# Patient Record
Sex: Male | Born: 1995 | Race: White | Hispanic: No | Marital: Married | Smoking: Former smoker
Health system: Southern US, Community
[De-identification: ages and names within clinical notes are randomized; demographics above are authoritative.]

## PROBLEM LIST (undated history)

## (undated) DIAGNOSIS — E063 Autoimmune thyroiditis: Secondary | ICD-10-CM

## (undated) DIAGNOSIS — E119 Type 2 diabetes mellitus without complications: Secondary | ICD-10-CM

## (undated) DIAGNOSIS — A4902 Methicillin resistant Staphylococcus aureus infection, unspecified site: Secondary | ICD-10-CM

## (undated) DIAGNOSIS — J189 Pneumonia, unspecified organism: Secondary | ICD-10-CM

## (undated) DIAGNOSIS — E1110 Type 2 diabetes mellitus with ketoacidosis without coma: Secondary | ICD-10-CM

## (undated) HISTORY — PX: CHEST TUBE INSERTION: SHX231

---
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
## 2016-03-09 ENCOUNTER — Ambulatory Visit (HOSPITAL_COMMUNITY)
Admission: EM | Admit: 2016-03-09 | Discharge: 2016-03-09 | Disposition: A | Discharge status: Home / Self Care | Payer: BLUE CROSS/BLUE SHIELD | Attending: Family Medicine | Admitting: Family Medicine

## 2016-03-09 ENCOUNTER — Encounter (HOSPITAL_COMMUNITY): Payer: Self-pay | Admitting: Nurse Practitioner

## 2016-03-09 DIAGNOSIS — B86 Scabies: Secondary | ICD-10-CM

## 2016-03-09 HISTORY — DX: Type 2 diabetes mellitus without complications: E11.9

## 2016-03-09 HISTORY — DX: Pneumonia, unspecified organism: J18.9

## 2016-03-09 HISTORY — DX: Autoimmune thyroiditis: E06.3

## 2016-03-09 MED ORDER — PERMETHRIN 5 % EX CREA: TOPICAL_CREAM | CUTANEOUS | Status: DC

## 2016-03-09 NOTE — ED Provider Notes (Signed)
CSN: [id number]     Arrival date & time 03/09/16  1346 History   None    Chief Complaint  Patient presents with  . Rash   (Consider location/radiation/quality/duration/timing/severity/associated sxs/prior Treatment) Patient is a 20 y.o. male presenting with rash. The history is provided by the patient.  Rash Location:  Full body Quality: itchiness and scaling   Severity:  Moderate Onset quality:  Gradual Duration:  1 week Chronicity:  New Context: not exposure to similar rash   Context comment:  Works in [employer] and slept in one prior to onset   Past Medical History  Diagnosis Date  . Diabetes mellitus without complication (HCC)   . Hashimoto's disease   . Pneumonia    Past Surgical History  Procedure Laterality Date  . Chest tube insertion     History reviewed. No pertinent family history. Social History  Substance Use Topics  . Smoking status: Current Every Day Smoker    Types: Cigarettes  . Smokeless tobacco: None  . Alcohol Use: No    Review of Systems  Skin: Positive for rash.  All other systems reviewed and are negative.   Allergies  Ibuprofen  Home Medications   Prior to Admission medications   Medication Sig Start Date End Date Taking? Authorizing Provider  insulin glargine (LANTUS) 100 UNIT/ML injection Inject into the skin at bedtime.   Yes Historical Provider, MD  insulin lispro (HUMALOG) 100 UNIT/ML injection Inject into the skin 3 (three) times daily before meals.   Yes Historical Provider, MD  thyroid (ARMOUR) 15 MG tablet Take 15 mg by mouth daily.   Yes Historical Provider, MD  permethrin (ELIMITE) 5 % cream Apply over entire body from neck down tonight ,allow to dry and wash off in am, retreat in 1 week. 03/09/16   Linna HoffJames D Jeremian Whitby, MD   Meds Ordered and Administered this Visit  Medications - No data to display  BP 133/86 mmHg  Pulse 87  Temp(Src) 97.7 F (36.5 C) (Oral)  Resp 17  SpO2 98% No data found.   Physical Exam  Constitutional: He  is oriented to person, place, and time. He appears well-developed and well-nourished. No distress.  Neurological: He is alert and oriented to person, place, and time.  Skin: Skin is warm and dry. Rash noted.  Scattered papular crusting pruritic lesions.  Nursing note and vitals reviewed.   ED Course  Procedures (including critical care time)  Labs Review Labs Reviewed - No data to display  Imaging Review No results found.   Visual Acuity Review  Right Eye Distance:   Left Eye Distance:   Bilateral Distance:    Right Eye Near:   Left Eye Near:    Bilateral Near:         MDM   1. Scabies        Linna HoffJames D Ariq Khamis, MD 03/09/16 1534

## 2016-03-09 NOTE — ED Notes (Signed)
Pt c/o 1 week history itchy red rash to hands, arms, armpits, trunk, legs. He denies using any new products or medications. No one in house with similar rash. He is alert and breathing easily.

## 2016-05-13 DIAGNOSIS — E109 Type 1 diabetes mellitus without complications: Secondary | ICD-10-CM | POA: Diagnosis not present

## 2016-05-13 DIAGNOSIS — E038 Other specified hypothyroidism: Secondary | ICD-10-CM | POA: Diagnosis not present

## 2016-05-13 DIAGNOSIS — E1065 Type 1 diabetes mellitus with hyperglycemia: Secondary | ICD-10-CM | POA: Diagnosis not present

## 2016-10-03 DIAGNOSIS — F909 Attention-deficit hyperactivity disorder, unspecified type: Secondary | ICD-10-CM | POA: Diagnosis not present

## 2016-10-03 DIAGNOSIS — B9729 Other coronavirus as the cause of diseases classified elsewhere: Secondary | ICD-10-CM | POA: Diagnosis not present

## 2016-10-03 DIAGNOSIS — R531 Weakness: Secondary | ICD-10-CM | POA: Diagnosis not present

## 2016-10-03 DIAGNOSIS — R918 Other nonspecific abnormal finding of lung field: Secondary | ICD-10-CM | POA: Diagnosis not present

## 2016-10-03 DIAGNOSIS — Z794 Long term (current) use of insulin: Secondary | ICD-10-CM | POA: Diagnosis not present

## 2016-10-03 DIAGNOSIS — R1084 Generalized abdominal pain: Secondary | ICD-10-CM | POA: Diagnosis not present

## 2016-10-03 DIAGNOSIS — K859 Acute pancreatitis without necrosis or infection, unspecified: Secondary | ICD-10-CM | POA: Diagnosis not present

## 2016-10-03 DIAGNOSIS — Z8249 Family history of ischemic heart disease and other diseases of the circulatory system: Secondary | ICD-10-CM | POA: Diagnosis not present

## 2016-10-03 DIAGNOSIS — L309 Dermatitis, unspecified: Secondary | ICD-10-CM | POA: Diagnosis not present

## 2016-10-03 DIAGNOSIS — Z833 Family history of diabetes mellitus: Secondary | ICD-10-CM | POA: Diagnosis not present

## 2016-10-03 DIAGNOSIS — R404 Transient alteration of awareness: Secondary | ICD-10-CM | POA: Diagnosis not present

## 2016-10-03 DIAGNOSIS — E1010 Type 1 diabetes mellitus with ketoacidosis without coma: Secondary | ICD-10-CM | POA: Diagnosis not present

## 2016-10-03 DIAGNOSIS — E063 Autoimmune thyroiditis: Secondary | ICD-10-CM | POA: Diagnosis not present

## 2016-10-03 DIAGNOSIS — R079 Chest pain, unspecified: Secondary | ICD-10-CM | POA: Diagnosis not present

## 2016-10-03 DIAGNOSIS — Z7722 Contact with and (suspected) exposure to environmental tobacco smoke (acute) (chronic): Secondary | ICD-10-CM | POA: Diagnosis not present

## 2016-10-03 DIAGNOSIS — I10 Essential (primary) hypertension: Secondary | ICD-10-CM | POA: Diagnosis not present

## 2016-10-03 DIAGNOSIS — I498 Other specified cardiac arrhythmias: Secondary | ICD-10-CM | POA: Diagnosis not present

## 2016-10-04 DIAGNOSIS — E063 Autoimmune thyroiditis: Secondary | ICD-10-CM | POA: Diagnosis not present

## 2016-10-04 DIAGNOSIS — K859 Acute pancreatitis without necrosis or infection, unspecified: Secondary | ICD-10-CM | POA: Diagnosis not present

## 2016-10-04 DIAGNOSIS — E1010 Type 1 diabetes mellitus with ketoacidosis without coma: Secondary | ICD-10-CM | POA: Diagnosis not present

## 2016-10-04 DIAGNOSIS — I498 Other specified cardiac arrhythmias: Secondary | ICD-10-CM | POA: Diagnosis not present

## 2016-10-04 DIAGNOSIS — Z794 Long term (current) use of insulin: Secondary | ICD-10-CM | POA: Diagnosis not present

## 2016-10-04 DIAGNOSIS — R079 Chest pain, unspecified: Secondary | ICD-10-CM | POA: Diagnosis not present

## 2016-10-08 ENCOUNTER — Ambulatory Visit (HOSPITAL_COMMUNITY)
Admission: EM | Admit: 2016-10-08 | Discharge: 2016-10-08 | Disposition: A | Discharge status: Home / Self Care | Payer: BLUE CROSS/BLUE SHIELD | Attending: Family Medicine | Admitting: Family Medicine

## 2016-10-08 ENCOUNTER — Encounter (HOSPITAL_COMMUNITY): Payer: Self-pay | Admitting: Emergency Medicine

## 2016-10-08 DIAGNOSIS — B9562 Methicillin resistant Staphylococcus aureus infection as the cause of diseases classified elsewhere: Secondary | ICD-10-CM

## 2016-10-08 DIAGNOSIS — L0390 Cellulitis, unspecified: Secondary | ICD-10-CM

## 2016-10-08 HISTORY — DX: Methicillin resistant Staphylococcus aureus infection, unspecified site: A49.02

## 2016-10-08 MED ORDER — DOXYCYCLINE HYCLATE 100 MG PO TABS: 100.0000 mg | ORAL_TABLET | Freq: Two times a day (BID) | ORAL | 0 refills | Status: DC

## 2016-10-08 NOTE — Discharge Instructions (Signed)
You need to return to this facility or see her primary care doctor tomorrow if the wound has gotten much worse. Please don't hesitate to come by if you have any questions.

## 2016-10-08 NOTE — ED Triage Notes (Signed)
Open wound to right lower leg.  Patient is a diabetic

## 2016-10-08 NOTE — ED Provider Notes (Signed)
[facility]    CSN: [id number] Arrival date & time: 10/08/16  1358     History   Chief Complaint No chief complaint on file.   HPI David Ramsey is a 21 y.o. male.   This is a 21 year old man with right leg pain and swelling. He was discharged from the hospital with DKA 4 days ago and at that time was having right lower extremity pain. He's had MRSA before. The area on his right anterior shin has gotten progressively more sore. His blood sugars continue to run in the 100s.      Past Medical History:  Diagnosis Date  . Diabetes mellitus without complication (HCC)   . Hashimoto's disease   . Pneumonia     There are no active problems to display for this patient.   Past Surgical History:  Procedure Laterality Date  . CHEST TUBE INSERTION         Home Medications    Prior to Admission medications   Medication Sig Start Date End Date Taking? Authorizing Provider  doxycycline (VIBRA-TABS) 100 MG tablet Take 1 tablet (100 mg total) by mouth 2 (two) times daily. 10/08/16   Elvina Sidle, MD  insulin glargine (LANTUS) 100 UNIT/ML injection Inject into the skin at bedtime.    Historical Provider, MD  insulin lispro (HUMALOG) 100 UNIT/ML injection Inject into the skin 3 (three) times daily before meals.    Historical Provider, MD  permethrin (ELIMITE) 5 % cream Apply over entire body from neck down tonight ,allow to dry and wash off in am, retreat in 1 week. 03/09/16   Linna Hoff, MD  thyroid (ARMOUR) 15 MG tablet Take 15 mg by mouth daily.    Historical Provider, MD    Family History No family history on file.  Social History Social History  Substance Use Topics  . Smoking status: Current Every Day Smoker    Types: Cigarettes  . Smokeless tobacco: Not on file  . Alcohol use No     Allergies   Ibuprofen   Review of Systems Review of Systems  Constitutional: Negative.   Musculoskeletal: Positive for gait problem and myalgias.  Skin: Positive for  color change and wound.     Physical Exam Triage Vital Signs ED Triage Vitals  Enc Vitals Group     BP      Pulse      Resp      Temp      Temp src      SpO2      Weight      Height      Head Circumference      Peak Flow      Pain Score      Pain Loc      Pain Edu?      Excl. in GC?    No data found.   Updated Vital Signs There were no vitals taken for this visit.   Physical Exam  Constitutional: He is oriented to person, place, and time. He appears well-developed and well-nourished.  HENT:  Head: Normocephalic.  Right Ear: External ear normal.  Left Ear: External ear normal.  Mouth/Throat: Oropharynx is clear and moist.  Eyes: Conjunctivae and EOM are normal.  Neck: Normal range of motion. Neck supple.  Pulmonary/Chest: Effort normal.  Musculoskeletal: Normal range of motion. He exhibits tenderness.  Neurological: He is alert and oriented to person, place, and time.  Skin: Skin is warm. There is erythema.  Patient has mild  swelling anterior mid right shin with a 3 mm ulcer, surrounding white eschar and erythema. He is tender without fluctuance in the area around the wound measuring about 4 cm.  Nursing note and vitals reviewed.    UC Treatments / Results  Labs (all labs ordered are listed, but only abnormal results are displayed) Labs Reviewed - No data to display  EKG  EKG Interpretation None       Radiology No results found.  Procedures Procedures (including critical care time)  Medications Ordered in UC Medications - No data to display   Initial Impression / Assessment and Plan / UC Course  I have reviewed the triage vital signs and the nursing notes.  Pertinent labs & imaging results that were available during my care of the patient were reviewed by me and considered in my medical decision making (see chart for details).   Final Clinical Impressions(s) / UC Diagnoses   Final diagnoses:  MRSA cellulitis    New Prescriptions New  Prescriptions   DOXYCYCLINE (VIBRA-TABS) 100 MG TABLET    Take 1 tablet (100 mg total) by mouth 2 (two) times daily.  I have born the patient to return if things are getting worse so that we can recheck for necrotizing fasciitis.   Elvina SidleKurt Tiasia Weberg, MD 10/08/16 [phone]

## 2016-12-04 ENCOUNTER — Emergency Department (HOSPITAL_COMMUNITY)
Admission: EM | Admit: 2016-12-04 | Discharge: 2016-12-05 | Disposition: A | Discharge status: Home / Self Care | Payer: BLUE CROSS/BLUE SHIELD | Attending: Emergency Medicine | Admitting: Emergency Medicine

## 2016-12-04 ENCOUNTER — Encounter (HOSPITAL_COMMUNITY): Payer: Self-pay

## 2016-12-04 ENCOUNTER — Emergency Department (HOSPITAL_COMMUNITY): Payer: BLUE CROSS/BLUE SHIELD

## 2016-12-04 DIAGNOSIS — R109 Unspecified abdominal pain: Secondary | ICD-10-CM | POA: Diagnosis not present

## 2016-12-04 DIAGNOSIS — F17210 Nicotine dependence, cigarettes, uncomplicated: Secondary | ICD-10-CM | POA: Diagnosis not present

## 2016-12-04 DIAGNOSIS — Z794 Long term (current) use of insulin: Secondary | ICD-10-CM | POA: Insufficient documentation

## 2016-12-04 DIAGNOSIS — R1031 Right lower quadrant pain: Secondary | ICD-10-CM | POA: Diagnosis present

## 2016-12-04 DIAGNOSIS — E1165 Type 2 diabetes mellitus with hyperglycemia: Secondary | ICD-10-CM | POA: Insufficient documentation

## 2016-12-04 DIAGNOSIS — R739 Hyperglycemia, unspecified: Secondary | ICD-10-CM

## 2016-12-04 DIAGNOSIS — K5900 Constipation, unspecified: Secondary | ICD-10-CM | POA: Diagnosis not present

## 2016-12-04 LAB — URINALYSIS, ROUTINE W REFLEX MICROSCOPIC
BACTERIA UA: NONE SEEN
BILIRUBIN URINE: NEGATIVE
Glucose, UA: >=500 mg/dL — AB
HGB URINE DIPSTICK: NEGATIVE
Ketones, ur: 5 mg/dL — AB
LEUKOCYTES UA: NEGATIVE
NITRITE: NEGATIVE
PH: 6 (ref 5.0–8.0)
PROTEIN: NEGATIVE mg/dL
Specific Gravity, Urine: 1.027 (ref 1.005–1.030)

## 2016-12-04 LAB — BASIC METABOLIC PANEL
Anion gap: 10 (ref 5–15)
BUN: 17 mg/dL (ref 6–20)
CALCIUM: 9.3 mg/dL (ref 8.9–10.3)
CO2: 30 mmol/L (ref 22–32)
Chloride: 97 mmol/L — ABNORMAL LOW (ref 101–111)
Creatinine, Ser: 1.09 mg/dL (ref 0.61–1.24)
Glucose, Bld: 324 mg/dL — ABNORMAL HIGH (ref 65–99)
POTASSIUM: 3.9 mmol/L (ref 3.5–5.1)
SODIUM: 137 mmol/L (ref 135–145)

## 2016-12-04 LAB — CBC
HEMATOCRIT: 44.5 % (ref 39.0–52.0)
Hemoglobin: 15.7 g/dL (ref 13.0–17.0)
MCH: 31.1 pg (ref 26.0–34.0)
MCHC: 35.3 g/dL (ref 30.0–36.0)
MCV: 88.1 fL (ref 78.0–100.0)
Platelets: 342 10*3/uL (ref 150–400)
RBC: 5.05 MIL/uL (ref 4.22–5.81)
RDW: 12.4 % (ref 11.5–15.5)
WBC: 6.8 10*3/uL (ref 4.0–10.5)

## 2016-12-04 MED ORDER — ONDANSETRON HCL 4 MG/2ML IJ SOLN
4.0000 mg | Freq: Once | INTRAMUSCULAR | Status: AC
  Administered 2016-12-04: 4 mg via INTRAVENOUS
  Filled 2016-12-04: qty 2

## 2016-12-04 MED ORDER — SODIUM CHLORIDE 0.9 % IV BOLUS (SEPSIS)
1000.0000 mL | Freq: Once | INTRAVENOUS | Status: AC
  Administered 2016-12-04: 1000 mL via INTRAVENOUS

## 2016-12-04 MED ORDER — HYDROMORPHONE HCL 1 MG/ML IJ SOLN
0.5000 mg | Freq: Once | INTRAMUSCULAR | Status: AC
  Administered 2016-12-04: 0.5 mg via INTRAVENOUS
  Filled 2016-12-04: qty 1

## 2016-12-04 NOTE — ED Triage Notes (Signed)
Pt complaining of R flank pain. Pt states difficulty urinating. Pt also complaining of N/V. Pt states hx of type 1 DM.

## 2016-12-04 NOTE — ED Notes (Signed)
Pt updated on wait time and that he is next to go to a treatment room.  Pt has gone to smoke and will be back.

## 2016-12-04 NOTE — ED Notes (Signed)
Patient transported to CT 

## 2016-12-04 NOTE — ED Notes (Signed)
Pt returned from CT °

## 2016-12-05 LAB — CBG MONITORING, ED: Glucose-Capillary: 191 mg/dL — ABNORMAL HIGH (ref 65–99)

## 2016-12-05 MED ORDER — POLYETHYLENE GLYCOL 3350 17 G PO PACK: PACK | ORAL | 0 refills | Status: DC

## 2016-12-05 MED ORDER — ONDANSETRON 4 MG PO TBDP: 4.0000 mg | ORAL_TABLET | Freq: Three times a day (TID) | ORAL | 0 refills | Status: DC | PRN

## 2016-12-05 MED ORDER — HYDROCODONE-ACETAMINOPHEN 5-325 MG PO TABS: 1.0000 | ORAL_TABLET | ORAL | 0 refills | Status: DC | PRN

## 2016-12-05 MED ORDER — HYDROMORPHONE HCL 1 MG/ML IJ SOLN
0.5000 mg | Freq: Once | INTRAMUSCULAR | Status: AC
  Administered 2016-12-05: 0.5 mg via INTRAVENOUS
  Filled 2016-12-05: qty 1

## 2016-12-05 NOTE — ED Provider Notes (Signed)
[facility] Provider Note   CSN: [id number] Arrival date & time: 12/04/16  2114     History   Chief Complaint Chief Complaint  Patient presents with  . Flank Pain  . Nausea    HPI David Ramsey is a 21 y.o. male.  Patient with a history of T1DM, Hashimoto's Disease presents with complaint of right flank pain x 1 month. The pain is noted to radiate from the flank to the RLQ, and is associated with nausea, vomiting. No fever. He reports that he presents tonight because he felt his symptoms worsening. He denies dysuria but reports urinary hesitancy. No history of kidney stones.    The history is provided by the patient. No language interpreter was used.  Flank Pain  Associated symptoms include abdominal pain.    Past Medical History:  Diagnosis Date  . Diabetes mellitus without complication (HCC)   . Hashimoto's disease   . MRSA infection   . Pneumonia     There are no active problems to display for this patient.   Past Surgical History:  Procedure Laterality Date  . CHEST TUBE INSERTION         Home Medications    Prior to Admission medications   Medication Sig Start Date End Date Taking? Authorizing Provider  doxycycline (VIBRA-TABS) 100 MG tablet Take 1 tablet (100 mg total) by mouth 2 (two) times daily. 10/08/16   Elvina Sidle, MD  insulin glargine (LANTUS) 100 UNIT/ML injection Inject into the skin at bedtime.    Historical Provider, MD  insulin lispro (HUMALOG) 100 UNIT/ML injection Inject into the skin 3 (three) times daily before meals.    Historical Provider, MD  permethrin (ELIMITE) 5 % cream Apply over entire body from neck down tonight ,allow to dry and wash off in am, retreat in 1 week. 03/09/16   Linna Hoff, MD  thyroid (ARMOUR) 15 MG tablet Take 15 mg by mouth daily.    Historical Provider, MD    Family History History reviewed. No pertinent family history.  Social History Social History  Substance Use Topics  . Smoking status:  Current Every Day Smoker    Types: Cigarettes  . Smokeless tobacco: Never Used  . Alcohol use No     Allergies   Ibuprofen   Review of Systems Review of Systems  Constitutional: Negative for chills and fever.  Respiratory: Negative.   Cardiovascular: Negative.   Gastrointestinal: Positive for abdominal pain, nausea and vomiting.  Genitourinary: Positive for difficulty urinating and flank pain. Negative for hematuria.  Musculoskeletal: Negative for joint swelling.  Skin: Negative.   Neurological: Negative.      Physical Exam Updated Vital Signs BP 130/70   Pulse 82   Temp 98.3 F (36.8 C) (Oral)   Resp 20   SpO2 98%   Physical Exam  Constitutional: He is oriented to person, place, and time. He appears well-developed and well-nourished.  HENT:  Head: Normocephalic.  Neck: Normal range of motion. Neck supple.  Cardiovascular: Normal rate and regular rhythm.   Pulmonary/Chest: Effort normal and breath sounds normal. He has no wheezes. He has no rales.  Abdominal: Soft. Bowel sounds are normal. There is tenderness (RLQ tenderness.). There is no rebound and no guarding.  Genitourinary:  Genitourinary Comments: Right CVA tenderness present.  Musculoskeletal: Normal range of motion.  Neurological: He is alert and oriented to person, place, and time.  Skin: Skin is warm and dry. No rash noted.  Psychiatric: He has a normal mood and  affect.     ED Treatments / Results  Labs (all labs ordered are listed, but only abnormal results are displayed) Labs Reviewed  BASIC METABOLIC PANEL - Abnormal; Notable for the following:       Result Value   Chloride 97 (*)    Glucose, Bld 324 (*)    All other components within normal limits  URINALYSIS, ROUTINE W REFLEX MICROSCOPIC - Abnormal; Notable for the following:    Glucose, UA >=500 (*)    Ketones, ur 5 (*)    Squamous Epithelial / LPF 0-5 (*)    All other components within normal limits  CBG MONITORING, ED - Abnormal;  Notable for the following:    Glucose-Capillary 191 (*)    All other components within normal limits  CBC  CBG MONITORING, ED   Results for orders placed or performed during the hospital encounter of 12/04/16  Basic metabolic panel  Result Value Ref Range   Sodium 137 135 - 145 mmol/L   Potassium 3.9 3.5 - 5.1 mmol/L   Chloride 97 (L) 101 - 111 mmol/L   CO2 30 22 - 32 mmol/L   Glucose, Bld 324 (H) 65 - 99 mg/dL   BUN 17 6 - 20 mg/dL   Creatinine, Ser 1.61 0.61 - 1.24 mg/dL   Calcium 9.3 8.9 - 09.6 mg/dL   GFR calc non Af Amer >60 >60 mL/min   GFR calc Af Amer >60 >60 mL/min   Anion gap 10 5 - 15  CBC  Result Value Ref Range   WBC 6.8 4.0 - 10.5 K/uL   RBC 5.05 4.22 - 5.81 MIL/uL   Hemoglobin 15.7 13.0 - 17.0 g/dL   HCT 04.5 40.9 - 81.1 %   MCV 88.1 78.0 - 100.0 fL   MCH 31.1 26.0 - 34.0 pg   MCHC 35.3 30.0 - 36.0 g/dL   RDW 91.4 78.2 - 95.6 %   Platelets 342 150 - 400 K/uL  Urinalysis, Routine w reflex microscopic  Result Value Ref Range   Color, Urine YELLOW YELLOW   APPearance CLEAR CLEAR   Specific Gravity, Urine 1.027 1.005 - 1.030   pH 6.0 5.0 - 8.0   Glucose, UA >=500 (A) NEGATIVE mg/dL   Hgb urine dipstick NEGATIVE NEGATIVE   Bilirubin Urine NEGATIVE NEGATIVE   Ketones, ur 5 (A) NEGATIVE mg/dL   Protein, ur NEGATIVE NEGATIVE mg/dL   Nitrite NEGATIVE NEGATIVE   Leukocytes, UA NEGATIVE NEGATIVE   RBC / HPF 0-5 0 - 5 RBC/hpf   WBC, UA 0-5 0 - 5 WBC/hpf   Bacteria, UA NONE SEEN NONE SEEN   Squamous Epithelial / LPF 0-5 (A) NONE SEEN  CBG monitoring, ED  Result Value Ref Range   Glucose-Capillary 191 (H) 65 - 99 mg/dL    EKG  EKG Interpretation None       Radiology Ct Renal Stone Study  Result Date: 12/04/2016 CLINICAL DATA:  Right flank pain and difficulty urinating EXAM: CT ABDOMEN AND PELVIS WITHOUT CONTRAST TECHNIQUE: Multidetector CT imaging of the abdomen and pelvis was performed following the standard protocol without IV contrast. COMPARISON:   None. FINDINGS: LOWER CHEST: Lung bases are clear. Minimal atelectasis in the right middle lobe and right lower lobe. Included heart size is normal. No pericardial effusion. HEPATOBILIARY: Liver and gallbladder are normal. PANCREAS: Normal. SPLEEN: Normal. ADRENALS/URINARY TRACT: Kidneys are orthotopic. No nephrolithiasis, hydronephrosis or solid renal masses. The unopacified ureters are normal in course and caliber. Urinary bladder is partially distended and  unremarkable. Normal adrenal glands. STOMACH/BOWEL: The stomach, small and large bowel are normal in course and caliber without inflammatory changes. Moderate amount of fecal retention is seen within large bowel. Normal appendix. VASCULAR/LYMPHATIC: Aortoiliac vessels are normal in course and caliber. No lymphadenopathy by CT size criteria. REPRODUCTIVE: Normal. OTHER: No intraperitoneal free fluid or free air. MUSCULOSKELETAL: Nonacute. IMPRESSION: No nephrolithiasis nor obstructive uropathy. Moderate colonic fecal retention may reflect constipation. Electronically Signed   By: Tollie Eth M.D.   On: 12/04/2016 23:49    Procedures Procedures (including critical care time)  Medications Ordered in ED Medications  sodium chloride 0.9 % bolus 1,000 mL (1,000 mLs Intravenous New Bag/Given 12/04/16 2318)  HYDROmorphone (DILAUDID) injection 0.5 mg (0.5 mg Intravenous Given 12/04/16 2319)  ondansetron (ZOFRAN) injection 4 mg (4 mg Intravenous Given 12/04/16 2318)     Initial Impression / Assessment and Plan / ED Course  I have reviewed the triage vital signs and the nursing notes.  Pertinent labs & imaging results that were available during my care of the patient were reviewed by me and considered in my medical decision making (see chart for details).     Patient with right flank pain x 1 month. CT scan performed to evaluate for ureterolithiasis and is found to be negative. Of note, the appendix is visualized and is normal.   No evidence of  infection. There is constipation noted without observed obstruction. He is hyperglycemic to 324. This is improved with IVF's.   Discussed PCP follow up, dietary constipation considerations, limited use of pain medication.  Final Clinical Impressions(s) / ED Diagnoses   Final diagnoses:  None   1. Right flank pain 2. Constipation 3. Hyperglycemia  New Prescriptions New Prescriptions   No medications on file     Elpidio Anis, PA-C 12/05/16 0053    Geoffery Lyons, MD 12/05/16 [phone]

## 2016-12-14 ENCOUNTER — Other Ambulatory Visit: Payer: Self-pay | Admitting: Family Medicine

## 2016-12-14 ENCOUNTER — Other Ambulatory Visit (HOSPITAL_COMMUNITY)
Admission: RE | Admit: 2016-12-14 | Discharge: 2016-12-14 | Disposition: A | Discharge status: Home / Self Care | Payer: BLUE CROSS/BLUE SHIELD | Source: Ambulatory Visit | Attending: Family Medicine | Admitting: Family Medicine

## 2016-12-14 DIAGNOSIS — M549 Dorsalgia, unspecified: Secondary | ICD-10-CM | POA: Diagnosis not present

## 2016-12-14 DIAGNOSIS — Z113 Encounter for screening for infections with a predominantly sexual mode of transmission: Secondary | ICD-10-CM | POA: Insufficient documentation

## 2016-12-14 DIAGNOSIS — R591 Generalized enlarged lymph nodes: Secondary | ICD-10-CM | POA: Diagnosis not present

## 2016-12-14 DIAGNOSIS — E10649 Type 1 diabetes mellitus with hypoglycemia without coma: Secondary | ICD-10-CM | POA: Diagnosis not present

## 2016-12-14 DIAGNOSIS — E039 Hypothyroidism, unspecified: Secondary | ICD-10-CM | POA: Diagnosis not present

## 2016-12-15 ENCOUNTER — Encounter (HOSPITAL_COMMUNITY): Payer: Self-pay

## 2016-12-15 DIAGNOSIS — S81801A Unspecified open wound, right lower leg, initial encounter: Secondary | ICD-10-CM | POA: Diagnosis not present

## 2016-12-15 DIAGNOSIS — Z794 Long term (current) use of insulin: Secondary | ICD-10-CM | POA: Insufficient documentation

## 2016-12-15 DIAGNOSIS — M79661 Pain in right lower leg: Secondary | ICD-10-CM | POA: Diagnosis present

## 2016-12-15 DIAGNOSIS — Z23 Encounter for immunization: Secondary | ICD-10-CM | POA: Diagnosis not present

## 2016-12-15 DIAGNOSIS — Z9119 Patient's noncompliance with other medical treatment and regimen: Secondary | ICD-10-CM | POA: Insufficient documentation

## 2016-12-15 DIAGNOSIS — F17210 Nicotine dependence, cigarettes, uncomplicated: Secondary | ICD-10-CM | POA: Diagnosis not present

## 2016-12-15 DIAGNOSIS — M545 Low back pain: Secondary | ICD-10-CM | POA: Insufficient documentation

## 2016-12-15 DIAGNOSIS — E860 Dehydration: Secondary | ICD-10-CM | POA: Diagnosis not present

## 2016-12-15 DIAGNOSIS — E1010 Type 1 diabetes mellitus with ketoacidosis without coma: Secondary | ICD-10-CM | POA: Diagnosis not present

## 2016-12-15 DIAGNOSIS — X58XXXA Exposure to other specified factors, initial encounter: Secondary | ICD-10-CM | POA: Insufficient documentation

## 2016-12-15 DIAGNOSIS — N179 Acute kidney failure, unspecified: Secondary | ICD-10-CM | POA: Diagnosis not present

## 2016-12-15 DIAGNOSIS — Z8614 Personal history of Methicillin resistant Staphylococcus aureus infection: Secondary | ICD-10-CM | POA: Insufficient documentation

## 2016-12-15 DIAGNOSIS — Z5321 Procedure and treatment not carried out due to patient leaving prior to being seen by health care provider: Secondary | ICD-10-CM | POA: Diagnosis not present

## 2016-12-15 DIAGNOSIS — L03115 Cellulitis of right lower limb: Secondary | ICD-10-CM | POA: Diagnosis not present

## 2016-12-15 DIAGNOSIS — E063 Autoimmune thyroiditis: Secondary | ICD-10-CM | POA: Diagnosis not present

## 2016-12-15 DIAGNOSIS — I889 Nonspecific lymphadenitis, unspecified: Secondary | ICD-10-CM | POA: Insufficient documentation

## 2016-12-15 DIAGNOSIS — G8929 Other chronic pain: Secondary | ICD-10-CM | POA: Diagnosis not present

## 2016-12-15 MED ORDER — ONDANSETRON 4 MG PO TBDP
4.0000 mg | ORAL_TABLET | Freq: Once | ORAL | Status: AC | PRN
  Administered 2016-12-15: 4 mg via ORAL

## 2016-12-15 MED ORDER — ONDANSETRON 4 MG PO TBDP
ORAL_TABLET | ORAL | Status: AC
  Filled 2016-12-15: qty 1

## 2016-12-15 NOTE — ED Triage Notes (Signed)
Pt comes in with multiple complaints; pt c/o groin hernia x 1 week ago, pt states he has hx of staph infection in right leg and has taken  antibotic for it but has not clear up; pt states hx of DM; Pt  Presents with open wound to bottom right leg; pt states he also has some back pain; pt c/o pain at 8/10 on arrival. Pt a&ox 4 on arrival.

## 2016-12-16 ENCOUNTER — Observation Stay (HOSPITAL_COMMUNITY)
Admission: EM | Admit: 2016-12-16 | Discharge: 2016-12-16 | Discharge status: Against Medical Advice | Payer: BLUE CROSS/BLUE SHIELD | Attending: Internal Medicine | Admitting: Internal Medicine

## 2016-12-16 ENCOUNTER — Encounter (HOSPITAL_COMMUNITY): Payer: Self-pay | Admitting: Family Medicine

## 2016-12-16 DIAGNOSIS — N179 Acute kidney failure, unspecified: Secondary | ICD-10-CM

## 2016-12-16 DIAGNOSIS — M545 Low back pain, unspecified: Secondary | ICD-10-CM

## 2016-12-16 DIAGNOSIS — E1065 Type 1 diabetes mellitus with hyperglycemia: Secondary | ICD-10-CM | POA: Diagnosis present

## 2016-12-16 DIAGNOSIS — E1010 Type 1 diabetes mellitus with ketoacidosis without coma: Secondary | ICD-10-CM | POA: Diagnosis present

## 2016-12-16 DIAGNOSIS — L03115 Cellulitis of right lower limb: Secondary | ICD-10-CM

## 2016-12-16 DIAGNOSIS — G8929 Other chronic pain: Secondary | ICD-10-CM

## 2016-12-16 DIAGNOSIS — E039 Hypothyroidism, unspecified: Secondary | ICD-10-CM | POA: Diagnosis present

## 2016-12-16 DIAGNOSIS — E1110 Type 2 diabetes mellitus with ketoacidosis without coma: Secondary | ICD-10-CM | POA: Diagnosis present

## 2016-12-16 DIAGNOSIS — L0390 Cellulitis, unspecified: Secondary | ICD-10-CM | POA: Diagnosis present

## 2016-12-16 HISTORY — DX: Acute kidney failure, unspecified: N17.9

## 2016-12-16 LAB — CBC WITH DIFFERENTIAL/PLATELET
BASOS ABS: 0 10*3/uL (ref 0.0–0.1)
Basophils Relative: 0 %
Eosinophils Absolute: 0.1 10*3/uL (ref 0.0–0.7)
Eosinophils Relative: 1 %
HEMATOCRIT: 43.7 % (ref 39.0–52.0)
Hemoglobin: 15.3 g/dL (ref 13.0–17.0)
LYMPHS ABS: 1.2 10*3/uL (ref 0.7–4.0)
LYMPHS PCT: 12 %
MCH: 31.3 pg (ref 26.0–34.0)
MCHC: 35 g/dL (ref 30.0–36.0)
MCV: 89.4 fL (ref 78.0–100.0)
MONO ABS: 1.2 10*3/uL — AB (ref 0.1–1.0)
Monocytes Relative: 12 %
NEUTROS ABS: 7.8 10*3/uL — AB (ref 1.7–7.7)
Neutrophils Relative %: 75 %
Platelets: 281 10*3/uL (ref 150–400)
RBC: 4.89 MIL/uL (ref 4.22–5.81)
RDW: 12.2 % (ref 11.5–15.5)
WBC: 10.4 10*3/uL (ref 4.0–10.5)

## 2016-12-16 LAB — CBG MONITORING, ED
GLUCOSE-CAPILLARY: 416 mg/dL — AB (ref 65–99)
GLUCOSE-CAPILLARY: 285 mg/dL — AB (ref 65–99)
GLUCOSE-CAPILLARY: 140 mg/dL — AB (ref 65–99)
GLUCOSE-CAPILLARY: 263 mg/dL — AB (ref 65–99)
Glucose-Capillary: 513 mg/dL (ref 65–99)
Glucose-Capillary: 360 mg/dL — ABNORMAL HIGH (ref 65–99)
Glucose-Capillary: 263 mg/dL — ABNORMAL HIGH (ref 65–99)
Glucose-Capillary: 184 mg/dL — ABNORMAL HIGH (ref 65–99)
Glucose-Capillary: 273 mg/dL — ABNORMAL HIGH (ref 65–99)

## 2016-12-16 LAB — BASIC METABOLIC PANEL
Anion gap: 20 — ABNORMAL HIGH (ref 5–15)
Anion gap: 11 (ref 5–15)
BUN: 15 mg/dL (ref 6–20)
BUN: 12 mg/dL (ref 6–20)
CALCIUM: 8.6 mg/dL — AB (ref 8.9–10.3)
CALCIUM: 8.6 mg/dL — AB (ref 8.9–10.3)
CO2: 13 mmol/L — AB (ref 22–32)
CO2: 22 mmol/L (ref 22–32)
Chloride: 98 mmol/L — ABNORMAL LOW (ref 101–111)
Chloride: 102 mmol/L (ref 101–111)
Creatinine, Ser: 1.41 mg/dL — ABNORMAL HIGH (ref 0.61–1.24)
Creatinine, Ser: 1.12 mg/dL (ref 0.61–1.24)
GFR calc Af Amer: >60 mL/min (ref >60)
GFR calc Af Amer: >60 mL/min (ref >60)
GLUCOSE: 490 mg/dL — AB (ref 65–99)
GLUCOSE: 250 mg/dL — AB (ref 65–99)
Potassium: 4.2 mmol/L (ref 3.5–5.1)
Potassium: 4.1 mmol/L (ref 3.5–5.1)
Sodium: 131 mmol/L — ABNORMAL LOW (ref 135–145)
Sodium: 135 mmol/L (ref 135–145)

## 2016-12-16 LAB — URINALYSIS, ROUTINE W REFLEX MICROSCOPIC
BACTERIA UA: NONE SEEN
Bilirubin Urine: NEGATIVE
Glucose, UA: >=500 mg/dL — AB
Hgb urine dipstick: NEGATIVE
KETONES UR: 80 mg/dL — AB
LEUKOCYTES UA: NEGATIVE
Nitrite: NEGATIVE
PH: 5 (ref 5.0–8.0)
PROTEIN: NEGATIVE mg/dL
Specific Gravity, Urine: 1.027 (ref 1.005–1.030)
WBC, UA: NONE SEEN WBC/hpf (ref 0–5)

## 2016-12-16 LAB — COMPREHENSIVE METABOLIC PANEL
ALBUMIN: 3.8 g/dL (ref 3.5–5.0)
ALT: 14 U/L — ABNORMAL LOW (ref 17–63)
AST: 15 U/L (ref 15–41)
Alkaline Phosphatase: 87 U/L (ref 38–126)
Anion gap: 19 — ABNORMAL HIGH (ref 5–15)
BUN: 13 mg/dL (ref 6–20)
CHLORIDE: 92 mmol/L — AB (ref 101–111)
CO2: 20 mmol/L — AB (ref 22–32)
Calcium: 9.5 mg/dL (ref 8.9–10.3)
Creatinine, Ser: 1.41 mg/dL — ABNORMAL HIGH (ref 0.61–1.24)
GFR calc Af Amer: >60 mL/min (ref >60)
GFR calc non Af Amer: >60 mL/min (ref >60)
GLUCOSE: 571 mg/dL — AB (ref 65–99)
POTASSIUM: 4.5 mmol/L (ref 3.5–5.1)
SODIUM: 131 mmol/L — AB (ref 135–145)
TOTAL PROTEIN: 6.7 g/dL (ref 6.5–8.1)
Total Bilirubin: 1.8 mg/dL — ABNORMAL HIGH (ref 0.3–1.2)

## 2016-12-16 LAB — RAPID URINE DRUG SCREEN, HOSP PERFORMED
AMPHETAMINES: NOT DETECTED
Barbiturates: NOT DETECTED
Benzodiazepines: NOT DETECTED
Cocaine: NOT DETECTED
OPIATES: NOT DETECTED
TETRAHYDROCANNABINOL: NOT DETECTED

## 2016-12-16 LAB — MAGNESIUM: Magnesium: 1.8 mg/dL (ref 1.7–2.4)

## 2016-12-16 LAB — GLUCOSE, CAPILLARY: GLUCOSE-CAPILLARY: 236 mg/dL — AB (ref 65–99)

## 2016-12-16 LAB — BILIRUBIN, FRACTIONATED(TOT/DIR/INDIR)
BILIRUBIN TOTAL: 2.1 mg/dL — AB (ref 0.3–1.2)
Bilirubin, Direct: 0.2 mg/dL (ref 0.1–0.5)
Indirect Bilirubin: 1.9 mg/dL — ABNORMAL HIGH (ref 0.3–0.9)

## 2016-12-16 LAB — I-STAT CG4 LACTIC ACID, ED: LACTIC ACID, VENOUS: 1.36 mmol/L (ref 0.5–1.9)

## 2016-12-16 LAB — TSH: TSH: 65.218 u[IU]/mL — ABNORMAL HIGH (ref 0.350–4.500)

## 2016-12-16 LAB — MRSA PCR SCREENING: MRSA BY PCR: NEGATIVE

## 2016-12-16 LAB — HIV ANTIBODY (ROUTINE TESTING W REFLEX): HIV Screen 4th Generation wRfx: NONREACTIVE

## 2016-12-16 LAB — PHOSPHORUS: Phosphorus: 3.7 mg/dL (ref 2.5–4.6)

## 2016-12-16 MED ORDER — ENOXAPARIN SODIUM 40 MG/0.4ML ~~LOC~~ SOLN
40.0000 mg | SUBCUTANEOUS | Status: DC
  Filled 2016-12-16 (×2): qty 0.4

## 2016-12-16 MED ORDER — INSULIN ASPART 100 UNIT/ML ~~LOC~~ SOLN: 0.0000 [IU] | Freq: Every day | SUBCUTANEOUS | Status: DC

## 2016-12-16 MED ORDER — MORPHINE SULFATE (PF) 4 MG/ML IV SOLN
4.0000 mg | Freq: Once | INTRAVENOUS | Status: AC
  Administered 2016-12-16: 4 mg via INTRAVENOUS
  Filled 2016-12-16: qty 1

## 2016-12-16 MED ORDER — INSULIN ASPART 100 UNIT/ML ~~LOC~~ SOLN
5.0000 [IU] | Freq: Three times a day (TID) | SUBCUTANEOUS | Status: DC
  Administered 2016-12-16: 5 [IU] via SUBCUTANEOUS

## 2016-12-16 MED ORDER — INSULIN GLARGINE 100 UNIT/ML ~~LOC~~ SOLN
35.0000 [IU] | Freq: Every day | SUBCUTANEOUS | Status: DC
  Administered 2016-12-16: 35 [IU] via SUBCUTANEOUS
  Filled 2016-12-16: qty 0.35

## 2016-12-16 MED ORDER — TETANUS-DIPHTH-ACELL PERTUSSIS 5-2.5-18.5 LF-MCG/0.5 IM SUSP
0.5000 mL | Freq: Once | INTRAMUSCULAR | Status: AC
  Administered 2016-12-16: 0.5 mL via INTRAMUSCULAR
  Filled 2016-12-16: qty 0.5

## 2016-12-16 MED ORDER — CLINDAMYCIN HCL 300 MG PO CAPS
300.0000 mg | ORAL_CAPSULE | Freq: Four times a day (QID) | ORAL | Status: DC
  Administered 2016-12-16 (×2): 300 mg via ORAL
  Filled 2016-12-16: qty 1
  Filled 2016-12-16: qty 2
  Filled 2016-12-16: qty 1

## 2016-12-16 MED ORDER — SODIUM CHLORIDE 0.9 % IV BOLUS (SEPSIS)
1000.0000 mL | Freq: Once | INTRAVENOUS | Status: AC
  Administered 2016-12-16: 1000 mL via INTRAVENOUS

## 2016-12-16 MED ORDER — ACETAMINOPHEN 500 MG PO TABS
1000.0000 mg | ORAL_TABLET | Freq: Four times a day (QID) | ORAL | Status: DC | PRN
  Administered 2016-12-16: 1000 mg via ORAL
  Filled 2016-12-16: qty 2

## 2016-12-16 MED ORDER — LEVOTHYROXINE SODIUM 75 MCG PO TABS
150.0000 ug | ORAL_TABLET | Freq: Every day | ORAL | Status: DC
  Administered 2016-12-16: 150 ug via ORAL
  Filled 2016-12-16: qty 1

## 2016-12-16 MED ORDER — SODIUM CHLORIDE 0.9 % IV SOLN
INTRAVENOUS | Status: DC
  Administered 2016-12-16 (×2): via INTRAVENOUS

## 2016-12-16 MED ORDER — CLINDAMYCIN PHOSPHATE 600 MG/50ML IV SOLN
600.0000 mg | Freq: Once | INTRAVENOUS | Status: AC
  Administered 2016-12-16: 600 mg via INTRAVENOUS
  Filled 2016-12-16: qty 50

## 2016-12-16 MED ORDER — POTASSIUM CHLORIDE CRYS ER 20 MEQ PO TBCR
20.0000 meq | EXTENDED_RELEASE_TABLET | Freq: Once | ORAL | Status: AC
  Administered 2016-12-16: 20 meq via ORAL
  Filled 2016-12-16: qty 1

## 2016-12-16 MED ORDER — INSULIN ASPART 100 UNIT/ML ~~LOC~~ SOLN
0.0000 [IU] | Freq: Three times a day (TID) | SUBCUTANEOUS | Status: DC
  Administered 2016-12-16: 3 [IU] via SUBCUTANEOUS
  Administered 2016-12-16: 5 [IU] via SUBCUTANEOUS
  Filled 2016-12-16: qty 1

## 2016-12-16 MED ORDER — ONDANSETRON HCL 4 MG/2ML IJ SOLN
4.0000 mg | Freq: Once | INTRAMUSCULAR | Status: AC
  Administered 2016-12-16: 4 mg via INTRAVENOUS
  Filled 2016-12-16: qty 2

## 2016-12-16 MED ORDER — SODIUM CHLORIDE 0.9 % IV SOLN: INTRAVENOUS | Status: DC

## 2016-12-16 MED ORDER — DEXTROSE-NACL 5-0.45 % IV SOLN
INTRAVENOUS | Status: DC
  Administered 2016-12-16: 125 mL/h via INTRAVENOUS

## 2016-12-16 MED ORDER — SODIUM CHLORIDE 0.9 % IV SOLN
INTRAVENOUS | Status: DC
  Administered 2016-12-16: 3.6 [IU]/h via INTRAVENOUS
  Filled 2016-12-16: qty 2.5

## 2016-12-16 MED ORDER — SODIUM CHLORIDE 0.9 % IV SOLN
INTRAVENOUS | Status: DC
  Administered 2016-12-16: 4.5 [IU]/h via INTRAVENOUS
  Filled 2016-12-16: qty 2.5

## 2016-12-16 NOTE — H&P (Signed)
History and Physical  Patient Name: David Ramsey     ZOX:[id number]    DOB: Feb 19, 1996    DOA: 12/16/2016 PCP: No PCP Per Patient   Patient coming from: Home  Chief Complaint: Multiple, including right groin pain, right leg infections, low back pain, hyperglycemia  HPI: David Ramsey is a 21 y.o. male with a past medical history significant for T1DM, hypothyroidism who presents with DKA.  The patient initially presented primarily for his right groin pain, and the sensation that he had a hernia, as well as some unrelated skin sores on his right leg. On screening laboratory tests was noted that his blood sugar was 571, anion gap is 19, creatinine is 1.4 (from a baseline of 0.9). At that point, the patient did explain that his blood sugars have been in the 200s for several days, that he hadn't taken his insulin today at all because he wasn't eating as much and exerting himself at work and didn't want to bottom out.    ED course: -Afebrile, heart rate 105, respirations pulse ox normal, blood pressure 122/81 -Na 131, glucose 571, K 4.5, Cr 1.4 (baseline 0.9), WBC 10.4K, Hgb 15.3 -Lactate 1.36 -Urinalysis with ketones and glucosuria, no pyuria -Total bilirubin 1.8 -Anion gap 19 -Bicarbonate 20 -She was given a TDap shot, morphine for low back pain, 2 L normal saline and insulin drip for DKA, clindamycin IV for cellulitis and TRH were asked to evaluate for admission   The patient has no current endocrinologist.  Was admitted to [facility] in January for more severe DKA.  Has not had endo follow up since then.  Endroses missing his levothyroxine most days.       ROS: Review of Systems  Constitutional: Negative for chills and fever.  Respiratory: Negative for cough.   Gastrointestinal: Positive for abdominal pain (groin) and nausea. Negative for vomiting.  Musculoskeletal: Positive for back pain.  Skin: Positive for rash.  All other systems reviewed and are negative.         Past Medical  History:  Diagnosis Date  . Diabetes mellitus without complication (HCC)   . Hashimoto's disease   . MRSA infection   . Pneumonia    Liver contusion, lung effusion, sepsis multiorgan failure, no clear source    Past Surgical History:  Procedure Laterality Date  . CHEST TUBE INSERTION      Social History: Patient lives with his wife and daughter.  The patient walks unassisted. He smokes.  He is from [facility].  He works for [employer].    Allergies  Allergen Reactions  . Ibuprofen Other (See Comments)    WANT TO AVOID LIVER FAILURE, SO PATIENT WAS TOLD NOT TO TAKE    Family history: family history includes Cancer in his other; Diabetes in his cousin; Heart disease in his other; Thyroid disease in his other.  Prior to Admission medications   Medication Sig Start Date End Date Taking? Authorizing Provider  insulin glargine (LANTUS) 100 UNIT/ML injection Inject 35 Units into the skin at bedtime.    Yes Historical Provider, MD  insulin lispro (HUMALOG) 100 UNIT/ML injection Inject 1-15 Units into the skin 3 (three) times daily before meals. Sliding scale   Yes Historical Provider, MD  levothyroxine (SYNTHROID, LEVOTHROID) 150 MCG tablet Take 150 mcg by mouth daily. 05/15/16  Yes Historical Provider, MD  ondansetron (ZOFRAN ODT) 4 MG disintegrating tablet Take 1 tablet (4 mg total) by mouth every 8 (eight) hours as needed for nausea or vomiting. 12/05/16  Yes Elpidio Anis, PA-C  doxycycline (VIBRA-TABS) 100 MG tablet Take 1 tablet (100 mg total) by mouth 2 (two) times daily. Patient not taking: Reported on 12/16/2016 10/08/16   Elvina Sidle, MD  HYDROcodone-acetaminophen (NORCO/VICODIN) 5-325 MG tablet Take 1-2 tablets by mouth every 4 (four) hours as needed. Patient not taking: Reported on 12/16/2016 12/05/16   Elpidio Anis, PA-C  permethrin (ELIMITE) 5 % cream Apply over entire body from neck down tonight ,allow to dry and wash off in am, retreat in 1 week. Patient not taking:  Reported on 12/16/2016 03/09/16   Linna Hoff, MD  polyethylene glycol [facility]) packet Use 3 times daily until bowels clear. Maximum 3 consecutive days Patient not taking: Reported on 12/16/2016 12/05/16   Elpidio Anis, PA-C       Physical Exam: BP 129/77   Pulse 84   Temp 98.3 F (36.8 C) (Oral)   Resp 18   SpO2 98%  General appearance: Well-developed, adult male, alert and in no acute distress.   Eyes: Anicteric, conjunctiva pink, lids and lashes normal. PERRL.    ENT: No nasal deformity, discharge, epistaxis.  Hearing normal. OP moist without lesions.   Neck: No neck masses.  Trachea midline.  No thyromegaly/tenderness. Lymph: No cervical or supraclavicular lymphadenopathy. Skin: Warm and dry.  No jaundice.  Scattered ulcers on legs, as appear below, not clear these are that infected:    Cardiac: RRR, nl S1-S2, no murmurs appreciated.  Capillary refill is brisk.  JVP normal.  No LE edema.  Radial and DP pulses 2+ and symmetric. Respiratory: Normal respiratory rate and rhythm.  CTAB without rales or wheezes. Abdomen: Abdomen soft.  No TTP. No ascites, distension, hepatosplenomegaly.   MSK: No deformities or effusions.  No cyanosis or clubbing. Neuro: Cranial nerves normal.  Sensation intact to light touch. Speech is fluent.  Muscle strength normal.    Psych: Sensorium intact and responding to questions, attention normal.  Behavior appropriate.  Affect normal.  Judgment and insight appear normal.     Labs on Admission:  I have personally reviewed following labs and imaging studies: CBC:  Recent Labs Lab 12/15/16 2349  WBC 10.4  NEUTROABS 7.8*  HGB 15.3  HCT 43.7  MCV 89.4  PLT 281   Basic Metabolic Panel:  Recent Labs Lab 12/15/16 2349  NA 131*  K 4.5  CL 92*  CO2 20*  GLUCOSE 571*  BUN 13  CREATININE 1.41*  CALCIUM 9.5   GFR: CrCl cannot be calculated (Unknown ideal weight.).  Liver Function Tests:  Recent Labs Lab 12/15/16 2349  AST 15  ALT 14*    ALKPHOS 87  BILITOT 1.8*  PROT 6.7  ALBUMIN 3.8   No results for input(s): LIPASE, AMYLASE in the last 168 hours. No results for input(s): AMMONIA in the last 168 hours. Coagulation Profile: No results for input(s): INR, PROTIME in the last 168 hours. Cardiac Enzymes: No results for input(s): CKTOTAL, CKMB, CKMBINDEX, TROPONINI in the last 168 hours. BNP (last 3 results) No results for input(s): PROBNP in the last 8760 hours. HbA1C: No results for input(s): HGBA1C in the last 72 hours. CBG: No results for input(s): GLUCAP in the last 168 hours. Lipid Profile: No results for input(s): CHOL, HDL, LDLCALC, TRIG, CHOLHDL, LDLDIRECT in the last 72 hours. Thyroid Function Tests: No results for input(s): TSH, T4TOTAL, FREET4, T3FREE, THYROIDAB in the last 72 hours. Anemia Panel: No results for input(s): VITAMINB12, FOLATE, FERRITIN, TIBC, IRON, RETICCTPCT in the last 72 hours. Sepsis  Labs: Lactic acid normal Invalid input(s): PROCALCITONIN, LACTICIDVEN No results found for this or any previous visit (from the past 240 hour(s)).           Assessment/Plan  1. Diabetic ketoacidosis and type 1 diabetes:  Possibly precipitated by nonadherence, although he is evasive.  Possibly precipitated by skin infection.   -Insulin gtt -NPO and IVF -Every 4 hours BMP -Check mag and fast -Check urine drug screen -Consult to diabetes education -Check HIV   2. Possible cellulitis:  He is high risk for cellulitis.  -Clindamycin oral  3. Elevated LFTs:  -Check fractionated bilirubin  4. Hypothyroidism:  -Check TSH -Continue levothyroxine -Established with endocrinology in [place]           DVT prophylaxis: Lovenox  Code Status: FULL  Family Communication: None present  Disposition Plan: Anticipate IV fluids, insulin and close monitoring of electrolytes in stepdown. Consults called: None Admission status: OBS At the point of initial evaluation, it is my clinical opinion  that admission for OBSERVATION is reasonable and necessary because the patient's presenting complaints in the context of their chronic conditions represent sufficient risk of deterioration or significant morbidity to constitute reasonable grounds for close observation in the hospital setting, but that the patient may be medically stable for discharge from the hospital within 24 to 48 hours.    Medical decision making: Patient seen at 2:31 AM on 12/16/2016.  The patient was discussed with Dr. Elesa Massed.  What exists of the patient's chart was reviewed in depth and summarized above.  Clinical condition: stable from hemodynamic standpoint.        Alberteen Sam [facility] Pager [phone]

## 2016-12-16 NOTE — ED Provider Notes (Signed)
TIME SEEN: 1:30 AM  By signing my name below, I, Cynda Acres, attest that this documentation has been prepared under the direction and in the presence of Kristen N Ward, DO. Electronically Signed: Cynda Acres, Scribe. 12/16/16. 1:49 AM.  CHIEF COMPLAINT: Multiple complaints   HPI:  David Ramsey is a 21 y.o. male with a history of type 1 DM, who presents to the Emergency Department complaining of sudden-onset, constant right lower leg pain that began one week ago. 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 Patient reports multiple wounds to the right leg that have become infected. Patient denies any antibiotics use. Patient also states his blood sugar has been high lately, but was fine earlier today. Patient reports taking sliding scale humalog every 8 hours and 35 units of lantus at bedtime. Last tetanus shot is unknown.   Patient is also complaining of right lower back pain that began one month ago. Patient denies any focal injury or neurological deficits. Worse with movement.  Patient complaining of a right inguinal pain that appeared several days ago. Patient states he was attempting to lift a car seat, when he felt something "pop". She has seen a small bulge in this area. No testicular pain, scrotal swelling or masses. No dysuria or hematuria. States he thinks he has a hernia. Patient denies any other symptoms.   Patient denies any numbness, weakness, tingling, loss of bowel/ladder control, nausea, vomiting, diarrhea, fever, or chills.   ROS: See HPI Constitutional: no fever  Eyes: no drainage  ENT: no runny nose   Cardiovascular:  no chest pain  Resp: no SOB  GI: no vomiting GU: no dysuria Integumentary: no rash  Allergy: no hives  Musculoskeletal: no leg swelling  Neurological: no slurred speech ROS otherwise negative  PAST MEDICAL HISTORY/PAST SURGICAL HISTORY:  Past Medical History:  Diagnosis Date  . Diabetes mellitus without complication (HCC)   . Hashimoto's  disease   . MRSA infection   . Pneumonia     MEDICATIONS:  Prior to Admission medications   Medication Sig Start Date End Date Taking? Authorizing Provider  doxycycline (VIBRA-TABS) 100 MG tablet Take 1 tablet (100 mg total) by mouth 2 (two) times daily. 10/08/16   Elvina Sidle, MD  HYDROcodone-acetaminophen (NORCO/VICODIN) 5-325 MG tablet Take 1-2 tablets by mouth every 4 (four) hours as needed. 12/05/16   Elpidio Anis, PA-C  insulin glargine (LANTUS) 100 UNIT/ML injection Inject into the skin at bedtime.    Historical Provider, MD  insulin lispro (HUMALOG) 100 UNIT/ML injection Inject into the skin 3 (three) times daily before meals.    Historical Provider, MD  ondansetron (ZOFRAN ODT) 4 MG disintegrating tablet Take 1 tablet (4 mg total) by mouth every 8 (eight) hours as needed for nausea or vomiting. 12/05/16   Elpidio Anis, PA-C  permethrin (ELIMITE) 5 % cream Apply over entire body from neck down tonight ,allow to dry and wash off in am, retreat in 1 week. 03/09/16   Linna Hoff, MD  polyethylene glycol [facility]) packet Use 3 times daily until bowels clear. Maximum 3 consecutive days 12/05/16   Elpidio Anis, PA-C  thyroid (ARMOUR) 15 MG tablet Take 15 mg by mouth daily.    Historical Provider, MD    ALLERGIES:  Allergies  Allergen Reactions  . Ibuprofen     SOCIAL HISTORY:  Social History  Substance Use Topics  . Smoking status: Current Every Day Smoker    Types: Cigarettes  . Smokeless tobacco: Never Used  . Alcohol use No  FAMILY HISTORY: History reviewed. No pertinent family history.  EXAM: BP 122/81 (BP Location: Left Arm)   Pulse (!) 105   Temp 98.3 F (36.8 C) (Oral)   Resp 18   SpO2 98%  CONSTITUTIONAL: Alert and oriented and responds appropriately to questions. Well-appearing; well-nourished HEAD: Normocephalic EYES: Conjunctivae clear, pupils appear equal, EOMI ENT: normal nose; moist mucous membranes NECK: Supple, no meningismus, no nuchal rigidity,  no LAD  CARD: RRR; S1 and S2 appreciated; no murmurs, no clicks, no rubs, no gallops RESP: Normal chest excursion without splinting or tachypnea; breath sounds clear and equal bilaterally; no wheezes, no rhonchi, no rales, no hypoxia or respiratory distress, speaking full sentences ABD/GI: Normal bowel sounds; non-distended; soft, non-tender, no rebound, no guarding, no peritoneal signs, no hepatosplenomegaly, no hernia appreciated GU:  Normal external genitalia, circumcised male, normal penile shaft, no blood or discharge at the urethral meatus, no testicular masses or tenderness on exam, no scrotal masses or swelling, no hernias appreciated, 2+ femoral pulses bilaterally; no perineal erythema, warmth, subcutaneous air or crepitus; no high riding testicle, normal bilateral cremasteric reflex.  Chaperone present for exam. BACK:  The back appears normal and is non-tender to palpation, there is no CVA tenderness, no midline spinal tenderness or step-off or deformity EXT: Patient has scattered lesions to his right leg that are scabbed with surrounding erythema and warmth but no fluctuance or current drainage. Normal ROM in all joints; non-tender to palpation; no edema; normal capillary refill; no cyanosis, no calf tenderness or swelling    SKIN: Normal color for age and race; warm; no rash NEURO: Moves all extremities equally, normal gait, normal sensation PSYCH: The patient's mood and manner are appropriate. Grooming and personal hygiene are appropriate.  MEDICAL DECISION MAKING: Patient here with DKA, areas that appear cellulitic to the right leg. Also complaining of what he thinks is a hernia to the right inguinal area but there is no hernia appreciated on exam. Abdominal exam benign. GU exam benign. Patient also complaining of chronic right back pain with no focal neurologic deficits. I do not feel he needs further emergent workup for this at this time. I do not feel he needs imaging of his abdomen or  back at this time. Labs show glucose of 571, bicarbonate 20, anion gap 19 and large ketones in his urine. Lactate is normal. No leukocytosis but does have predominant neutrophils. Will update his tetanus vaccination given these wounds to his leg and give IV clindamycin. We'll give IV fluids and start IV insulin given he is in DKA. We'll give him morphine for pain control, Zofran. He reports he does not currently have a primary care provider. We'll discuss with medicine for admission.  ED PROGRESS:     2:11 AM Discussed patient's case with hospitalist, Dr. Maryfrances Bunnell.  I have recommended admission and patient (and family if present) agree with this plan. Admitting physician will place admission orders.   I reviewed all nursing notes, vitals, pertinent previous records, EKGs, lab and urine results, imaging (as available).   CRITICAL CARE Performed by: Raelyn Number   Total critical care time: 40 minutes  Critical care time was exclusive of separately billable procedures and treating other patients.  Critical care was necessary to treat or prevent imminent or life-threatening deterioration.  Critical care was time spent personally by me on the following activities: development of treatment plan with patient and/or surrogate as well as nursing, discussions with consultants, evaluation of patient's response to treatment, examination of patient,  obtaining history from patient or surrogate, ordering and performing treatments and interventions, ordering and review of laboratory studies, ordering and review of radiographic studies, pulse oximetry and re-evaluation of patient's condition.  I personally performed the services described in this documentation, which was scribed in my presence. The recorded information has been reviewed and is accurate.     Layla Maw Ward, DO 12/16/16 [phone]

## 2016-12-16 NOTE — ED Notes (Signed)
Date and time results received: 12/16/16 1:18 AM  Test: Glucose Critical Value: 571  Name of Provider Notified: Ward  Orders Received? Or Actions Taken?: Move to treatment room ASAP

## 2016-12-16 NOTE — Progress Notes (Addendum)
PROGRESS NOTE   David Ramsey  ZOX:[id number]    DOB: 09/22/1995    DOA: 12/16/2016  PCP: Geraldo Pitter, MD   I have briefly reviewed patients previous medical [facility] in [facility].  Brief Narrative:  21 year old male with a PMH of type I DM (diagnosed at age 81), not fully compliant with diet or M.D. follow-ups, hypothyroid, recently saw a new PCP day prior to admission, initially presented to physicians office with complaints of right groin pain wondering if he had a hernia and skin source on right leg. Lab data showed blood sugar 571, anion gap 19, creatinine 1.4 (baseline 0.9) and was admitted for DKA with possible cellulitis complicating right leg wounds. DKA resolved. Transitioned to Lantus and NovoLog.   Assessment & Plan:   Principal Problem:   DKA (diabetic ketoacidoses) (HCC) Active Problems:   Hypothyroidism (acquired)   Type 1 diabetes mellitus with hyperglycemia (HCC)   Cellulitis   AKI (acute kidney injury) (HCC)   1. DKA in type I DM, not at goal: Likely noncompliance at multiple levels including diet,? Medications and M.D. follow-ups. States that the last time he was seen by endocrinology at [facility], A1c was >10. Extensively counseled regarding compliance with all aspects of DM care and he verbalized understanding. Treated per DKA protocol with aggressive IV fluids, insulin drip and close BMP monitoring. DKA resolved. Transitioned to home dose of Lantus 35 units daily and added NovoLog SSI. Diabetes coordinator input appreciated and will add mealtime NovoLog 5 units. Monitor closely.Check A1c. 2. Dehydration: Secondary to hyperglycemia. Improved. Continue additional 24 hours of IV normal saline. 3. Acute kidney injury: Secondary to dehydration. Resolved. 4. Right leg wound: Wound itself appears to be healing and drying up but there is small rim of erythema around which may be cellulitis. Continue clindamycin. Right groin pain is related to lymphadenitis from draining  infection. Supportive treatment. 5. Hypothyroid: TSH 65-Mark elevated. Not sure regarding compliance. Clinically euthyroid. Discuss further with patient and if he is truly compliant, increase Synthroid and follow-up TSH in 4-6 weeks. However if he is not compliant then counseled regarding compliance with current dose of Synthroid and repeat TSH in 4-6 weeks. 6. Isolated hyperbilirubinemia: No GI symptoms reported.? Gilbert syndrome. 7. Noncompliance: Management as above.   DVT prophylaxis: Lovenox Code Status: Full Family Communication: None at bedside Disposition: Downgraded from stepdown to medical bed. DC home possibly 12/17/16.   Consultants:  None   Procedures:  None  Antimicrobials:  Clindamycin    Subjective: Feels better. Still complaining of mild right groin pain but no nausea, vomiting, or diarrhea. Overall feels better compared to initial admission.  ROS: Negative.  Objective:  Vitals:   12/16/16 1115 12/16/16 1130 12/16/16 1145 12/16/16 1200  BP: 106/63 105/61 107/60 111/60  Pulse: 72 70 66 70  Resp:      Temp:      TempSrc:      SpO2: 96% 96% 95% 96%  Weight:      Height:       temperature 98.41F, respiratory rate 18/m.  Examination:  General exam: Pleasant young male lying comfortably supine in the gurney in the ED. Oral mucosa still slightly dry. Respiratory system: Clear to auscultation. Respiratory effort normal. Cardiovascular system: S1 & S2 heard, RRR. No JVD, murmurs, rubs, gallops or clicks. No pedal edema. Gastrointestinal system: Abdomen is nondistended, soft and nontender. No organomegaly or masses felt. Normal bowel sounds heard. Central nervous system: Alert and oriented. No focal neurological deficits. Extremities: Symmetric 5  x 5 power. Skin: No rashes, lesions or ulcers. Right mid leg on the lateral aspect, large wound with scab and surrounding rim of erythema. No tenderness, increased warmth, crepitus or fluctuation. No drainage. Patient  has 3 small healing scabs on right thigh. Shotty and tender couple of right inguinal nodes no larger than 1 cm. No erythema or fluctuance there. Psychiatry: Judgement and insight appear normal. Mood & affect appropriate.     Data Reviewed: I have personally reviewed following labs and imaging studies  CBC:  Recent Labs Lab 12/15/16 2349  WBC 10.4  NEUTROABS 7.8*  HGB 15.3  HCT 43.7  MCV 89.4  PLT 281   Basic Metabolic Panel:  Recent Labs Lab 12/15/16 2349 12/16/16 0348 12/16/16 0748  NA 131* 131* 135  K 4.5 4.2 4.1  CL 92* 98* 102  CO2 20* 13* 22  GLUCOSE 571* 490* 250*  BUN CREATININE 1.41* 1.41* 1.12  CALCIUM 9.5 8.6* 8.6*  MG  --  1.8  --   PHOS  --  3.7  --    Liver Function Tests:  Recent Labs Lab 12/15/16 2349 12/16/16 0348  AST 15  --   ALT 14*  --   ALKPHOS 87  --   BILITOT 1.8* 2.1*  PROT 6.7  --   ALBUMIN 3.8  --    CBG:  Recent Labs Lab 12/16/16 0732 12/16/16 0839 12/16/16 0934 12/16/16 1123 12/16/16 1244  GLUCAP 263* 184* 140* 263* 273*    No results found for this or any previous visit (from the past 240 hour(s)).       Radiology Studies: No results found.      Scheduled Meds: . clindamycin  300 mg Oral Q6H  . enoxaparin (LOVENOX) injection  40 mg Subcutaneous Q24H  . insulin aspart  0-15 Units Subcutaneous TID WC  . insulin aspart  0-5 Units Subcutaneous QHS  . insulin glargine  35 Units Subcutaneous Daily  . levothyroxine  150 mcg Oral QAC breakfast   Continuous Infusions: . sodium chloride 150 mL/hr at 12/16/16 1258  . dextrose 5 % and 0.45% NaCl Stopped (12/16/16 1245)  . insulin (NOVOLIN-R) infusion Stopped (12/16/16 1245)     LOS: 0 days     Durant Scibilia, MD, FACP, FHM. [facility] Pager [phone]  If 7PM-7AM, please contact night-coverage [email or web address] Password [email or web address] 12/16/2016, 1:25 PM

## 2016-12-16 NOTE — Progress Notes (Signed)
Pt ask to leave AMA due to wife unable to care for their 69 month old baby, educated pt about completing course treatment and to wait for discharge properly in morning but pt unable to stay. Pt is alert and oriented and in no distress noted.

## 2016-12-16 NOTE — Progress Notes (Addendum)
Inpatient Diabetes Program Recommendations  AACE/ADA: New Consensus Statement on Inpatient Glycemic Control (2015)  Target Ranges:  Prepandial:   less than 140 mg/dL      Peak postprandial:   less than 180 mg/dL (1-2 hours)      Critically ill patients:  140 - 180 mg/dL   Lab Results  Component Value Date   GLUCAP 273 (H) 12/16/2016    Review of Glycemic Control:  Results for David Ramsey, David Ramsey (MRN [id number]) as of 12/16/2016 13:11  Ref. Range 12/16/2016 07:32 12/16/2016 08:39 12/16/2016 09:34 12/16/2016 11:23 12/16/2016 12:44  Glucose-Capillary Latest Ref Range: 65 - 99 mg/dL 829 (H) 562 (H) 130 (H) 263 (H) 273 (H)   Diabetes history: Type 1 diabetes since age 13 Outpatient Diabetes medications: Lantus 35 units daily, 1 unit for every 8 grams of CHO, Correction factor=20 (Blood sugar-120/20) Current orders for Inpatient glycemic control:  IV insulin transitioning to Lantus 35 units, Novolog moderate tid with meals and HS  Inpatient Diabetes Program Recommendations:   Please consider adding Novolog meal coverage 5 units tid with meals (patient covers 1 unit for every 8 grams of CHO at home).   Patient states that he was seen at Dr. Tedra Senegal office and is currently being set-up to see endocrinologist. He states he is unsure of his last A1C but knows it was high.  He currently works for [employer] and has a 29 month old daughter. He states that he has been in the hospital "many times" for DKA, last admit was in January of 2018.  He currently states he has no needs related to diabetes.      Thanks, Beryl Meager, RN, BC-ADM Inpatient Diabetes Coordinator Pager [phone] (8a-5p)

## 2016-12-16 NOTE — ED Notes (Signed)
Lunch ordered; carb-modified diet

## 2016-12-17 LAB — HEMOGLOBIN A1C
Hgb A1c MFr Bld: 11.8 % — ABNORMAL HIGH (ref 4.8–5.6)
Mean Plasma Glucose: 292 mg/dL

## 2016-12-17 LAB — URINE CYTOLOGY ANCILLARY ONLY
BACTERIAL VAGINITIS: NEGATIVE
CANDIDA VAGINITIS: NEGATIVE
Chlamydia: NEGATIVE
NEISSERIA GONORRHEA: NEGATIVE
TRICH (WINDOWPATH): NEGATIVE

## 2017-02-17 ENCOUNTER — Encounter (HOSPITAL_COMMUNITY): Payer: Self-pay | Admitting: Emergency Medicine

## 2017-02-17 ENCOUNTER — Emergency Department (HOSPITAL_COMMUNITY): Payer: BLUE CROSS/BLUE SHIELD

## 2017-02-17 ENCOUNTER — Observation Stay (HOSPITAL_COMMUNITY)
Admission: EM | Admit: 2017-02-17 | Discharge: 2017-02-18 | Disposition: A | Discharge status: Home / Self Care | Payer: BLUE CROSS/BLUE SHIELD | Attending: Internal Medicine | Admitting: Internal Medicine

## 2017-02-17 ENCOUNTER — Observation Stay (HOSPITAL_COMMUNITY): Payer: BLUE CROSS/BLUE SHIELD

## 2017-02-17 DIAGNOSIS — R1084 Generalized abdominal pain: Secondary | ICD-10-CM | POA: Diagnosis not present

## 2017-02-17 DIAGNOSIS — E1065 Type 1 diabetes mellitus with hyperglycemia: Secondary | ICD-10-CM | POA: Diagnosis not present

## 2017-02-17 DIAGNOSIS — R112 Nausea with vomiting, unspecified: Secondary | ICD-10-CM | POA: Diagnosis present

## 2017-02-17 DIAGNOSIS — K859 Acute pancreatitis without necrosis or infection, unspecified: Secondary | ICD-10-CM | POA: Diagnosis not present

## 2017-02-17 DIAGNOSIS — E039 Hypothyroidism, unspecified: Secondary | ICD-10-CM | POA: Diagnosis not present

## 2017-02-17 DIAGNOSIS — Z79899 Other long term (current) drug therapy: Secondary | ICD-10-CM | POA: Insufficient documentation

## 2017-02-17 DIAGNOSIS — R7309 Other abnormal glucose: Secondary | ICD-10-CM | POA: Diagnosis not present

## 2017-02-17 DIAGNOSIS — E1110 Type 2 diabetes mellitus with ketoacidosis without coma: Secondary | ICD-10-CM

## 2017-02-17 DIAGNOSIS — F17210 Nicotine dependence, cigarettes, uncomplicated: Secondary | ICD-10-CM | POA: Diagnosis not present

## 2017-02-17 DIAGNOSIS — E1010 Type 1 diabetes mellitus with ketoacidosis without coma: Principal | ICD-10-CM | POA: Insufficient documentation

## 2017-02-17 HISTORY — DX: Type 2 diabetes mellitus with ketoacidosis without coma: E11.10

## 2017-02-17 LAB — URINALYSIS, ROUTINE W REFLEX MICROSCOPIC
BACTERIA UA: NONE SEEN
Bilirubin Urine: NEGATIVE
Glucose, UA: >=500 mg/dL — AB
Hgb urine dipstick: NEGATIVE
KETONES UR: 80 mg/dL — AB
Leukocytes, UA: NEGATIVE
NITRITE: NEGATIVE
PROTEIN: NEGATIVE mg/dL
RBC / HPF: NONE SEEN RBC/hpf (ref 0–5)
SQUAMOUS EPITHELIAL / LPF: NONE SEEN
Specific Gravity, Urine: 1.018 (ref 1.005–1.030)
pH: 5 (ref 5.0–8.0)

## 2017-02-17 LAB — CBG MONITORING, ED
GLUCOSE-CAPILLARY: 100 mg/dL — AB (ref 65–99)
GLUCOSE-CAPILLARY: 279 mg/dL — AB (ref 65–99)
Glucose-Capillary: 268 mg/dL — ABNORMAL HIGH (ref 65–99)

## 2017-02-17 LAB — RAPID URINE DRUG SCREEN, HOSP PERFORMED
AMPHETAMINES: NOT DETECTED
BENZODIAZEPINES: POSITIVE — AB
Barbiturates: NOT DETECTED
COCAINE: NOT DETECTED
OPIATES: POSITIVE — AB
TETRAHYDROCANNABINOL: NOT DETECTED

## 2017-02-17 LAB — GLUCOSE, CAPILLARY
GLUCOSE-CAPILLARY: 323 mg/dL — AB (ref 65–99)
GLUCOSE-CAPILLARY: 146 mg/dL — AB (ref 65–99)
GLUCOSE-CAPILLARY: 193 mg/dL — AB (ref 65–99)
GLUCOSE-CAPILLARY: 146 mg/dL — AB (ref 65–99)
GLUCOSE-CAPILLARY: 156 mg/dL — AB (ref 65–99)
GLUCOSE-CAPILLARY: 122 mg/dL — AB (ref 65–99)
GLUCOSE-CAPILLARY: 122 mg/dL — AB (ref 65–99)
Glucose-Capillary: 267 mg/dL — ABNORMAL HIGH (ref 65–99)
Glucose-Capillary: 304 mg/dL — ABNORMAL HIGH (ref 65–99)
Glucose-Capillary: 229 mg/dL — ABNORMAL HIGH (ref 65–99)
Glucose-Capillary: 194 mg/dL — ABNORMAL HIGH (ref 65–99)
Glucose-Capillary: 192 mg/dL — ABNORMAL HIGH (ref 65–99)

## 2017-02-17 LAB — BASIC METABOLIC PANEL
ANION GAP: 19 — AB (ref 5–15)
Anion gap: 14 (ref 5–15)
Anion gap: 14 (ref 5–15)
Anion gap: 13 (ref 5–15)
BUN: 20 mg/dL (ref 6–20)
BUN: 16 mg/dL (ref 6–20)
BUN: 15 mg/dL (ref 6–20)
BUN: 13 mg/dL (ref 6–20)
CALCIUM: 9.6 mg/dL (ref 8.9–10.3)
CHLORIDE: 105 mmol/L (ref 101–111)
CHLORIDE: 105 mmol/L (ref 101–111)
CHLORIDE: 103 mmol/L (ref 101–111)
CO2: 16 mmol/L — AB (ref 22–32)
CO2: 20 mmol/L — AB (ref 22–32)
CO2: 18 mmol/L — AB (ref 22–32)
CO2: 20 mmol/L — AB (ref 22–32)
CREATININE: 0.89 mg/dL (ref 0.61–1.24)
CREATININE: 1.23 mg/dL (ref 0.61–1.24)
Calcium: 8.7 mg/dL — ABNORMAL LOW (ref 8.9–10.3)
Calcium: 9 mg/dL (ref 8.9–10.3)
Calcium: 9.1 mg/dL (ref 8.9–10.3)
Chloride: 102 mmol/L (ref 101–111)
Creatinine, Ser: 1.39 mg/dL — ABNORMAL HIGH (ref 0.61–1.24)
Creatinine, Ser: 1.33 mg/dL — ABNORMAL HIGH (ref 0.61–1.24)
GFR calc Af Amer: >60 mL/min (ref >60)
GFR calc Af Amer: >60 mL/min (ref >60)
GFR calc Af Amer: >60 mL/min (ref >60)
GFR calc Af Amer: >60 mL/min (ref >60)
GFR calc non Af Amer: >60 mL/min (ref >60)
GFR calc non Af Amer: >60 mL/min (ref >60)
GFR calc non Af Amer: >60 mL/min (ref >60)
GLUCOSE: 225 mg/dL — AB (ref 65–99)
GLUCOSE: 113 mg/dL — AB (ref 65–99)
Glucose, Bld: 287 mg/dL — ABNORMAL HIGH (ref 65–99)
Glucose, Bld: 212 mg/dL — ABNORMAL HIGH (ref 65–99)
POTASSIUM: 3.9 mmol/L (ref 3.5–5.1)
POTASSIUM: 4 mmol/L (ref 3.5–5.1)
POTASSIUM: 3.8 mmol/L (ref 3.5–5.1)
POTASSIUM: 3.9 mmol/L (ref 3.5–5.1)
SODIUM: 139 mmol/L (ref 135–145)
SODIUM: 137 mmol/L (ref 135–145)
SODIUM: 136 mmol/L (ref 135–145)
Sodium: 137 mmol/L (ref 135–145)

## 2017-02-17 LAB — CBC
HEMATOCRIT: 45.7 % (ref 39.0–52.0)
HEMATOCRIT: 42.2 % (ref 39.0–52.0)
HEMOGLOBIN: 15.6 g/dL (ref 13.0–17.0)
Hemoglobin: 14.2 g/dL (ref 13.0–17.0)
MCH: 31.4 pg (ref 26.0–34.0)
MCH: 30.7 pg (ref 26.0–34.0)
MCHC: 34.1 g/dL (ref 30.0–36.0)
MCHC: 33.6 g/dL (ref 30.0–36.0)
MCV: 92 fL (ref 78.0–100.0)
MCV: 91.1 fL (ref 78.0–100.0)
Platelets: 402 10*3/uL — ABNORMAL HIGH (ref 150–400)
Platelets: 376 10*3/uL (ref 150–400)
RBC: 4.97 MIL/uL (ref 4.22–5.81)
RBC: 4.63 MIL/uL (ref 4.22–5.81)
RDW: 13.1 % (ref 11.5–15.5)
RDW: 13 % (ref 11.5–15.5)
WBC: 14.5 10*3/uL — ABNORMAL HIGH (ref 4.0–10.5)
WBC: 11.2 10*3/uL — AB (ref 4.0–10.5)

## 2017-02-17 LAB — HEPATIC FUNCTION PANEL
ALBUMIN: 3.7 g/dL (ref 3.5–5.0)
ALT: 57 U/L (ref 17–63)
AST: 24 U/L (ref 15–41)
Alkaline Phosphatase: 108 U/L (ref 38–126)
BILIRUBIN TOTAL: 1.5 mg/dL — AB (ref 0.3–1.2)
Bilirubin, Direct: 0.2 mg/dL (ref 0.1–0.5)
Indirect Bilirubin: 1.3 mg/dL — ABNORMAL HIGH (ref 0.3–0.9)
Total Protein: 6.7 g/dL (ref 6.5–8.1)

## 2017-02-17 LAB — MRSA PCR SCREENING: MRSA BY PCR: NEGATIVE

## 2017-02-17 LAB — LIPASE, BLOOD: Lipase: 842 U/L — ABNORMAL HIGH (ref 11–51)

## 2017-02-17 LAB — MAGNESIUM: Magnesium: 2 mg/dL (ref 1.7–2.4)

## 2017-02-17 LAB — TSH: TSH: 12.484 u[IU]/mL — ABNORMAL HIGH (ref 0.350–4.500)

## 2017-02-17 MED ORDER — SODIUM CHLORIDE 0.9 % IV SOLN
INTRAVENOUS | Status: DC
  Administered 2017-02-17: 0.5 [IU]/h via INTRAVENOUS
  Filled 2017-02-17: qty 1

## 2017-02-17 MED ORDER — ONDANSETRON HCL 4 MG/2ML IJ SOLN: 4.0000 mg | Freq: Four times a day (QID) | INTRAMUSCULAR | Status: DC | PRN

## 2017-02-17 MED ORDER — DEXTROSE-NACL 5-0.45 % IV SOLN
INTRAVENOUS | Status: DC
  Administered 2017-02-17: 18:00:00 via INTRAVENOUS

## 2017-02-17 MED ORDER — PANTOPRAZOLE SODIUM 40 MG IV SOLR
40.0000 mg | INTRAVENOUS | Status: DC
  Administered 2017-02-17: 40 mg via INTRAVENOUS
  Filled 2017-02-17: qty 40

## 2017-02-17 MED ORDER — DEXTROSE 50 % IV SOLN
25.0000 mL | INTRAVENOUS | Status: DC | PRN
Start: 2017-02-17 — End: 2017-02-18

## 2017-02-17 MED ORDER — DEXTROSE-NACL 5-0.45 % IV SOLN: INTRAVENOUS | Status: DC

## 2017-02-17 MED ORDER — SODIUM CHLORIDE 0.9 % IV SOLN
INTRAVENOUS | Status: DC
  Administered 2017-02-17: 17:00:00 via INTRAVENOUS

## 2017-02-17 MED ORDER — LEVOTHYROXINE SODIUM 100 MCG IV SOLR
75.0000 ug | Freq: Every day | INTRAVENOUS | Status: DC
  Administered 2017-02-17: 75 ug via INTRAVENOUS
  Filled 2017-02-17 (×2): qty 5

## 2017-02-17 MED ORDER — ACETAMINOPHEN 325 MG PO TABS
650.0000 mg | ORAL_TABLET | Freq: Four times a day (QID) | ORAL | Status: DC | PRN
  Filled 2017-02-17: qty 2

## 2017-02-17 MED ORDER — MORPHINE SULFATE (PF) 4 MG/ML IV SOLN
4.0000 mg | Freq: Once | INTRAVENOUS | Status: AC
  Administered 2017-02-17: 4 mg via INTRAVENOUS
  Filled 2017-02-17: qty 1

## 2017-02-17 MED ORDER — DEXTROSE-NACL 5-0.45 % IV SOLN
INTRAVENOUS | Status: DC
  Administered 2017-02-17: 05:00:00 via INTRAVENOUS

## 2017-02-17 MED ORDER — ENOXAPARIN SODIUM 40 MG/0.4ML ~~LOC~~ SOLN
40.0000 mg | SUBCUTANEOUS | Status: DC
  Administered 2017-02-17 – 2017-02-18 (×2): 40 mg via SUBCUTANEOUS
  Filled 2017-02-17 (×3): qty 0.4

## 2017-02-17 MED ORDER — ALBUTEROL SULFATE (2.5 MG/3ML) 0.083% IN NEBU: 2.5000 mg | INHALATION_SOLUTION | RESPIRATORY_TRACT | Status: DC | PRN

## 2017-02-17 MED ORDER — INSULIN REGULAR BOLUS VIA INFUSION
0.0000 [IU] | Freq: Three times a day (TID) | INTRAVENOUS | Status: DC
  Filled 2017-02-17: qty 10

## 2017-02-17 MED ORDER — ONDANSETRON HCL 4 MG/2ML IJ SOLN
4.0000 mg | Freq: Once | INTRAMUSCULAR | Status: AC
  Administered 2017-02-17: 4 mg via INTRAVENOUS
  Filled 2017-02-17: qty 2

## 2017-02-17 MED ORDER — SODIUM CHLORIDE 0.9 % IV BOLUS (SEPSIS)
1000.0000 mL | Freq: Once | INTRAVENOUS | Status: AC
  Administered 2017-02-17: 1000 mL via INTRAVENOUS

## 2017-02-17 MED ORDER — SODIUM CHLORIDE 0.9 % IV SOLN
INTRAVENOUS | Status: DC
  Administered 2017-02-17: 4 [IU]/h via INTRAVENOUS
  Filled 2017-02-17: qty 1

## 2017-02-17 MED ORDER — SODIUM CHLORIDE 0.9 % IV BOLUS (SEPSIS)
1000.0000 mL | Freq: Once | INTRAVENOUS | Status: AC
Start: 2017-02-17 — End: 2017-02-17
  Administered 2017-02-17: 1000 mL via INTRAVENOUS

## 2017-02-17 MED ORDER — INSULIN ASPART 100 UNIT/ML ~~LOC~~ SOLN
10.0000 [IU] | Freq: Once | SUBCUTANEOUS | Status: AC
  Administered 2017-02-17: 10 [IU] via INTRAVENOUS
  Filled 2017-02-17: qty 1

## 2017-02-17 MED ORDER — DOXYCYCLINE HYCLATE 100 MG IV SOLR
100.0000 mg | Freq: Two times a day (BID) | INTRAVENOUS | Status: DC
  Administered 2017-02-17 – 2017-02-18 (×3): 100 mg via INTRAVENOUS
  Filled 2017-02-17 (×4): qty 100

## 2017-02-17 MED ORDER — SODIUM CHLORIDE 0.9 % IV SOLN: INTRAVENOUS | Status: DC

## 2017-02-17 NOTE — H&P (Addendum)
History and Physical    David AlbeCarter Louk NWG:[id number] DOB: 03/19/1996 DOA: 02/17/2017  PCP: Renaye RakersBland, Veita, MD  Patient coming from: Home.  Chief Complaint: Nausea vomiting and elevated blood sugar.  HPI: David Ramsey is a 21 y.o. male with history of diabetes mellitus type 1, hypothyroidism who was admitted last month to this facility for DKA presents to the ER with complaints of nausea vomiting and elevated blood sugar. Patient states yesterday morning when he woke up his blood sugar was in the 70s so he took some glucose tablets. Following which later on patients found that his blood sugars were in the 500s. He also eventually started developing nausea vomiting multiple episodes. Denies any diarrhea.   ED Course: Patient presented to the ER. Abdomen appears benign. Acute abdominal series was unremarkable. Blood sugar was in the 200s with anion gap of 19 and bicarbonate of 16. Patient is being admitted for DKA.  Review of Systems: As per HPI, rest all negative.   Past Medical History:  Diagnosis Date  . Diabetes mellitus without complication (HCC)   . Hashimoto's disease   . MRSA infection   . Pneumonia    Liver contusion, lung effusion, sepsis multiorgan failure, no clear source    Past Surgical History:  Procedure Laterality Date  . CHEST TUBE INSERTION       reports that he has been smoking Cigarettes.  He has never used smokeless tobacco. He reports that he does not drink alcohol or use drugs.  Allergies  Allergen Reactions  . Ibuprofen Other (See Comments)    WANT TO AVOID LIVER FAILURE, SO PATIENT WAS TOLD NOT TO TAKE    Family History  Problem Relation Age of Onset  . Diabetes Cousin   . Heart disease Other   . Cancer Other   . Thyroid disease Other     Prior to Admission medications   Medication Sig Start Date End Date Taking? Authorizing Provider  insulin glargine (LANTUS) 100 UNIT/ML injection Inject 35 Units into the skin at bedtime.    Yes [provider]  insulin lispro (HUMALOG) 100 UNIT/ML injection Inject 1-15 Units into the skin 3 (three) times daily before meals. Sliding scale   Yes [provider]  levothyroxine (SYNTHROID, LEVOTHROID) 150 MCG tablet Take 150 mcg by mouth daily. 05/15/16  Yes [provider]  ondansetron (ZOFRAN ODT) 4 MG disintegrating tablet Take 1 tablet (4 mg total) by mouth every 8 (eight) hours as needed for nausea or vomiting. 12/05/16  Yes Elpidio AnisUpstill, Shari, PA-C    Physical Exam: Vitals:   02/17/17 0430 02/17/17 0445 02/17/17 0500 02/17/17 0515  BP: 139/81 (!) 142/73 (!) 143/81 (!) 143/81  Pulse: 85 96 85 91  Resp: (!) 22 18 13  (!) 24  Temp:      TempSrc:      SpO2: 99% 99% 96% 97%  Weight:      Height:          Constitutional: Moderately built and nourished. Vitals:   02/17/17 0430 02/17/17 0445 02/17/17 0500 02/17/17 0515  BP: 139/81 (!) 142/73 (!) 143/81 (!) 143/81  Pulse: 85 96 85 91  Resp: (!) 22 18 13  (!) 24  Temp:      TempSrc:      SpO2: 99% 99% 96% 97%  Weight:      Height:       Eyes: Anicteric no pallor. ENMT: No discharge from the ears eyes nose or mouth. Neck: No neck rigidity no mass felt.  Respiratory: No rhonchi or crepitations. Cardiovascular: S1 and S2 heard no murmurs appreciated. Abdomen: Soft nontender bowel sounds present. No guarding or rigidity. Musculoskeletal: No edema. Skin: Skin rash from previous infection with mild erythema. Neurologic: Alert awake oriented to time place and person. Moves all extremities. Psychiatric: Appears normal. Normal affect.   Labs on Admission: I have personally reviewed following labs and imaging studies  CBC:  Recent Labs Lab 02/17/17 0230  WBC 14.5*  HGB 15.6  HCT 45.7  MCV 92.0  PLT 402*   Basic Metabolic Panel:  Recent Labs Lab 02/17/17 0230  NA 137  K 3.9  CL 102  CO2 16*  GLUCOSE 225*  BUN 20  CREATININE 1.39*  CALCIUM 9.6   GFR: Estimated Creatinine Clearance: 77.1 mL/min (A)  (by C-G formula based on SCr of 1.39 mg/dL (H)). Liver Function Tests: No results for input(s): AST, ALT, ALKPHOS, BILITOT, PROT, ALBUMIN in the last 168 hours. No results for input(s): LIPASE, AMYLASE in the last 168 hours. No results for input(s): AMMONIA in the last 168 hours. Coagulation Profile: No results for input(s): INR, PROTIME in the last 168 hours. Cardiac Enzymes: No results for input(s): CKTOTAL, CKMB, CKMBINDEX, TROPONINI in the last 168 hours. BNP (last 3 results) No results for input(s): PROBNP in the last 8760 hours. HbA1C: No results for input(s): HGBA1C in the last 72 hours. CBG:  Recent Labs Lab 02/17/17 0221  GLUCAP 268*   Lipid Profile: No results for input(s): CHOL, HDL, LDLCALC, TRIG, CHOLHDL, LDLDIRECT in the last 72 hours. Thyroid Function Tests: No results for input(s): TSH, T4TOTAL, FREET4, T3FREE, THYROIDAB in the last 72 hours. Anemia Panel: No results for input(s): VITAMINB12, FOLATE, FERRITIN, TIBC, IRON, RETICCTPCT in the last 72 hours. Urine analysis:    Component Value Date/Time   COLORURINE STRAW (A) 12/15/2016 2343   APPEARANCEUR CLEAR 12/15/2016 2343   LABSPEC 1.027 12/15/2016 2343   PHURINE 5.0 12/15/2016 2343   GLUCOSEU >=500 (A) 12/15/2016 2343   HGBUR NEGATIVE 12/15/2016 2343   BILIRUBINUR NEGATIVE 12/15/2016 2343   KETONESUR 80 (A) 12/15/2016 2343   PROTEINUR NEGATIVE 12/15/2016 2343   NITRITE NEGATIVE 12/15/2016 2343   LEUKOCYTESUR NEGATIVE 12/15/2016 2343   Sepsis Labs: @LABRCNTIP (procalcitonin:4,lacticidven:4) )No results found for this or any previous visit (from the past 240 hour(s)).   Radiological Exams on Admission: Dg Abdomen Acute W/chest  Result Date: 02/17/2017 CLINICAL DATA:  Generalized abdominal pain with nausea and vomiting EXAM: DG ABDOMEN ACUTE W/ 1V CHEST COMPARISON:  CT 12/04/2016 FINDINGS: Single-view chest demonstrates no consolidation or effusion. Normal cardiomediastinal silhouette. Supine and upright  views of the abdomen demonstrate no free air beneath the diaphragm. Diffuse decreased bowel gas which is nonspecific. Small calcified phlebolith left pelvis. IMPRESSION: 1. No acute infiltrate or edema 2. Diffuse nonspecific decreased bowel gas. Electronically Signed   By: Jasmine Pang M.D.   On: 02/17/2017 03:54     Assessment/Plan Principal Problem:   DKA, type 1 (HCC) Active Problems:   Hypothyroidism (acquired)   Nausea & vomiting    1. Diabetic ketoacidosis in type 1 diabetes - patient's hemoglobin A1c last admission was around 11. Precipitating cause at this time may be noncompliance but patient states he has been taking his Lantus. Patient is still nauseous. We will continue with insulin infusion until anion gap is corrected and patient also is able to tolerate by mouth's. Closely follow metabolic panel. Intake output. Continue with hydration. 2. 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 UA and urine drug  screen are pending. Check LFTs and lipase. Continue with hydration. 3. Hypothyroidism - patient states he has not been very compliant with his Synthroid. His last TSH last month was around 65. Recheck TSH. I have dosed Synthroid IV for now until patient can take orally. 4. Skin boils of the lower extremity for which I have placed patient on doxycycline.   DVT prophylaxis: Lovenox. Code Status: Full code.  Family Communication: Discussed with patient.  Disposition Plan: Home.  Consults called: None.  Admission status: Observation.    Eduard Clos MD [facility] Pager [phone].  If 7PM-7AM, please contact night-coverage [email or web address] Password [email or web address]  02/17/2017, 5:50 AM

## 2017-02-17 NOTE — Progress Notes (Addendum)
PROGRESS NOTE [facility]   David Ramsey   ZHY:[id number] DOB: 10-12-1995  DOA: 02/17/2017 PCP: Renaye Rakers, MD   Brief Narrative:  21 year-old male with Hx of diabetes mellitus type 1 and hypothyroidism who was admitted 1 month ago for DKA presented with nausea, vomiting, and elevated blood sugar. Found to have acute pancreatitis and possible DKA, admitted to the hospitalist service for further evaluation and treatment.  Assessment & Plan: DKA, Type 1 Diabetes: DKA has resolved-anion gap has closed, but before transitioning off the insulin infusion-we'll see if patient is able to tolerate diet-if not-he will likely remain on IV insulin infusion. Start clear liquids and see if he can be transitioned to subcutaneous insulin.  Pancreatitis: Significantly elevated lipase-not sure if this is secondary to DKA-but he does have ongoing abdominal pain-denies alcohol abuse-check RUQ ultrasound. Continue IV fluids-see if he can tolerate clear liquids. Follow.  Hypothyroidism:Controlled with lovethyroxine  ? Cellulitis of lower extremity: Continue doxycycline for now-follow-and if further improved-will discontinue antibiotics.  DVT prophylaxis: SCD's Code Status: Full Family Communication: not at bedside Disposition Plan: 24-48 hr home   Consultants:  - None  Procedures:  None  Antimicrobials: None  Subjective: Continues to have some epigastric pain-no vomiting.   Objective: Vitals:   02/17/17 0745 02/17/17 0800 02/17/17 0900 02/17/17 1128  BP: 132/76 136/79 139/85 139/77  Pulse: (!) 103 94 88 95  Resp: 19 16 17 18   Temp:   98.6 F (37 C) 98.6 F (37 C)  TempSrc:   Oral Oral  SpO2: 98% 99% 100% 98%  Weight:      Height:        Intake/Output Summary (Last 24 hours) at 02/17/17 1213 Last data filed at 02/17/17 0854  Gross per 24 hour  Intake             2000 ml  Output              800 ml  Net             1200 ml   Filed Weights   02/17/17 0217  Weight: 150  lb (68 kg)    Examination: General exam: Appears calm and  In no acute distress Gastrointestinal system: Abdomen is nondistended, soft and TTP epigastric-without any peritoneal signs Central nervous system: Alert and oriented. No focal neurological deficits. Extremities: No pedal edema. Skin: No rashes, lesions or ulcers Psychiatry: Judgement and insight appear normal. Mood & affect appropriate.    Data Reviewed: I have personally reviewed following labs and imaging studies  CBC:  Recent Labs Lab 02/17/17 0230 02/17/17 0619  WBC 14.5* 11.2*  HGB 15.6 14.2  HCT 45.7 42.2  MCV 92.0 91.1  PLT 402* 376   Basic Metabolic Panel:  Recent Labs Lab 02/17/17 0230 02/17/17 0619 02/17/17 0935  NA 137 139 137  K 3.9 4.0 3.8  CL 102 105 105  CO2 16* 20* 18*  GLUCOSE 225* 113* 287*  BUN 20 16 15   CREATININE 1.39* 0.89 1.23  CALCIUM 9.6 8.7* 9.0  MG  --  2.0  --    GFR: Estimated Creatinine Clearance: 87.2 mL/min (by C-G formula based on SCr of 1.23 mg/dL). Liver Function Tests:  Recent Labs Lab 02/17/17 0619  AST 24  ALT 57  ALKPHOS 108  BILITOT 1.5*  PROT 6.7  ALBUMIN 3.7    Recent Labs Lab 02/17/17 0619  LIPASE 842*   No results for input(s): AMMONIA in the last 168 hours. Coagulation Profile: No  results for input(s): INR, PROTIME in the last 168 hours. Cardiac Enzymes: No results for input(s): CKTOTAL, CKMB, CKMBINDEX, TROPONINI in the last 168 hours. BNP (last 3 results) No results for input(s): PROBNP in the last 8760 hours. HbA1C: No results for input(s): HGBA1C in the last 72 hours. CBG:  Recent Labs Lab 02/17/17 0221 02/17/17 0610 02/17/17 0827 02/17/17 1042  GLUCAP 268* 100* 279* 267*   Lipid Profile: No results for input(s): CHOL, HDL, LDLCALC, TRIG, CHOLHDL, LDLDIRECT in the last 72 hours. Thyroid Function Tests:  Recent Labs  02/17/17 0619  TSH 12.484*   Anemia Panel: No results for input(s): VITAMINB12, FOLATE, FERRITIN, TIBC,  IRON, RETICCTPCT in the last 72 hours. Sepsis Labs: No results for input(s): PROCALCITON, LATICACIDVEN in the last 168 hours.  No results found for this or any previous visit (from the past 240 hour(s)).       Radiology Studies: Dg Abdomen Acute W/chest  Result Date: 02/17/2017 CLINICAL DATA:  Generalized abdominal pain with nausea and vomiting EXAM: DG ABDOMEN ACUTE W/ 1V CHEST COMPARISON:  CT 12/04/2016 FINDINGS: Single-view chest demonstrates no consolidation or effusion. Normal cardiomediastinal silhouette. Supine and upright views of the abdomen demonstrate no free air beneath the diaphragm. Diffuse decreased bowel gas which is nonspecific. Small calcified phlebolith left pelvis. IMPRESSION: 1. No acute infiltrate or edema 2. Diffuse nonspecific decreased bowel gas. Electronically Signed   By: Jasmine PangKim  Fujinaga M.D.   On: 02/17/2017 03:54      Scheduled Meds: . enoxaparin (LOVENOX) injection  40 mg Subcutaneous Q24H  . levothyroxine  75 mcg Intravenous Daily   Continuous Infusions: . sodium chloride    . dextrose 5 % and 0.45% NaCl    . doxycycline (VIBRAMYCIN) IV 100 mg (02/17/17 0702)  . insulin (NOVOLIN-R) infusion 2.6 Units/hr (02/17/17 1204)     LOS: 0 days   Windell NorfolkS Hammad Finkler MD  If 7PM-7AM, please contact night-coverage [email or web address] Password [email or web address] 02/17/2017, 12:13 PM

## 2017-02-17 NOTE — Plan of Care (Signed)
Problem: Physical Regulation: Goal: Ability to maintain clinical measurements within normal limits will improve Outcome: Progressing Remains on glucostabilizer, but blood glucose has been within target range (140-180) for 3 consecutive readings this shift.

## 2017-02-17 NOTE — ED Notes (Signed)
CBG 109 

## 2017-02-17 NOTE — ED Triage Notes (Signed)
Pt brought to ED by [facility] from home for high Glucose at 2330 last night pt had a CBG 503, pt got 29 units of insulin on his own at 1 am today pt CBG 337 c/o abd pain, nausea and vomiting. 4 mg Zofran ODT given by EMS. SR on monitor.

## 2017-02-17 NOTE — ED Notes (Signed)
Insulin gtt in hold per glucoStabilizer, awaiting for bmp results.

## 2017-02-17 NOTE — Progress Notes (Signed)
Inpatient Diabetes Program Recommendations  AACE/ADA: New Consensus Statement on Inpatient Glycemic Control (2015)  Target Ranges:  Prepandial:   less than 140 mg/dL      Peak postprandial:   less than 180 mg/dL (1-2 hours)      Critically ill patients:  140 - 180 mg/dL   Lab Results  Component Value Date   GLUCAP 267 (H) 02/17/2017   HGBA1C 11.8 (H) 12/15/2016    Review of Glycemic Control  Diabetes history: DM1 Outpatient Diabetes medications: Lantus 35 units QHS, Humalog 1-15 units tidwc Current orders for Inpatient glycemic control: IV insulin per GlucoStabilizer  Inpatient Diabetes Program Recommendations:    When ready for transition to SQ insulin, give Lantus 35 units Q24H.  Novolog 0-9 units tidwc and hs Novolog 5 units tidwc  Will speak with pt this afternoon.   Thank you. Ailene Ardshonda Ziva Nunziata, RD, LDN, CDE Inpatient Diabetes Coordinator [phone]

## 2017-02-17 NOTE — ED Provider Notes (Signed)
[facility]-EMERGENCY DEPT Provider Note   CSN: [id number] Arrival date & time: 02/17/17  0214  By signing my name below, I, Rosario Adie, attest that this documentation has been prepared under the direction and in the presence of Horton, Mayer Masker, MD. Electronically Signed: Rosario Adie, ED Scribe. 02/17/17. 3:17 AM.  History   Chief Complaint Chief Complaint  Patient presents with  . Hyperglycemia  . Nausea   The history is provided by the patient and medical records. No language interpreter was used.   HPI Comments: Lyman Balingit is a 21 y.o. male BIB EMS, with a PMHx of DM1 w/ prior DKA, who presents to the Emergency Department complaining of hyperglycemia beginning yesterday evening. Per pt, he checked his CBG yesterday evening following a nap at 4PM and it was noted to be 79 at that time. He took 4 glucose tablets following this and several hours later his CBG has risen to around 500. He took 29u of insulin two hours ago after this. He is currently c/o generalized abdominal pain which, nausea, and vomiting which began around 4PM yesterday as well. He rates his current pain as 7-8/10. Pt has a h/o prior DKAx6, most recently admitted on 12/16/16, per prior chart review. Pt was given 4mg  Zofran ODT without significant relief of his nausea. He denies constipation, hematemesis, fever, chest pain, shortness of breath, cough, or any other associated symptoms.   Patient with history of DKA and sepsis with multisystem organ failure.  Past Medical History:  Diagnosis Date  . Diabetes mellitus without complication (HCC)   . Hashimoto's disease   . MRSA infection   . Pneumonia    Liver contusion, lung effusion, sepsis multiorgan failure, no clear source   Patient Active Problem List   Diagnosis Date Noted  . DKA (diabetic ketoacidoses) (HCC) 12/16/2016  . Cellulitis 12/16/2016  . AKI (acute kidney injury) (HCC) 12/16/2016  . DKA, type 1, not at goal [facility]) 12/16/2016  .  Hypothyroidism (acquired) 11/01/2014  . Type 1 diabetes mellitus with hyperglycemia (HCC) 08/15/2001   Past Surgical History:  Procedure Laterality Date  . CHEST TUBE INSERTION      Home Medications    Prior to Admission medications   Medication Sig Start Date End Date Taking? Authorizing Provider  insulin glargine (LANTUS) 100 UNIT/ML injection Inject 35 Units into the skin at bedtime.     [provider]  insulin lispro (HUMALOG) 100 UNIT/ML injection Inject 1-15 Units into the skin 3 (three) times daily before meals. Sliding scale    [provider]  levothyroxine (SYNTHROID, LEVOTHROID) 150 MCG tablet Take 150 mcg by mouth daily. 05/15/16   [provider]  ondansetron (ZOFRAN ODT) 4 MG disintegrating tablet Take 1 tablet (4 mg total) by mouth every 8 (eight) hours as needed for nausea or vomiting. 12/05/16   Elpidio Anis, PA-C   Family History Family History  Problem Relation Age of Onset  . Diabetes Cousin   . Heart disease Other   . Cancer Other   . Thyroid disease Other    Social History Social History  Substance Use Topics  . Smoking status: Current Every Day Smoker    Types: Cigarettes  . Smokeless tobacco: Never Used  . Alcohol use No   Allergies   Ibuprofen  Review of Systems Review of Systems  Constitutional: Negative for fever.  Respiratory: Negative for shortness of breath.   Cardiovascular: Negative for chest pain.  Gastrointestinal: Positive for abdominal pain, nausea and vomiting. Negative  for constipation.  Genitourinary: Negative for dysuria.  All other systems reviewed and are negative.  Physical Exam Updated Vital Signs BP (!) 141/86   Pulse 84   Temp 97.9 F (36.6 C) (Oral)   Resp (!) 24   Ht 5\' 6"  (1.676 m)   Wt 68 kg (150 lb)   SpO2 99%   BMI 24.21 kg/m   Physical Exam  Constitutional: He is oriented to person, place, and time.  Ill-appearing but nontoxic, no acute distress  HENT:  Head: Normocephalic and  atraumatic.  Mucous membranes dry  Cardiovascular: Normal rate, regular rhythm and normal heart sounds.   No murmur heard. Pulmonary/Chest: Effort normal and breath sounds normal. No respiratory distress. He has no wheezes.  Abdominal: Soft. Bowel sounds are normal. There is tenderness. There is no rebound and no guarding.  Mild diffuse tenderness to palpation, distractible, no focal tenderness  Musculoskeletal: He exhibits no edema.  Neurological: He is alert and oriented to person, place, and time.  Skin: Skin is warm and dry.  Psychiatric: He has a normal mood and affect.  Nursing note and vitals reviewed.  ED Treatments / Results  DIAGNOSTIC STUDIES: Oxygen Saturation is 99% on RA, normal by my interpretation.   COORDINATION OF CARE: 3:17 AM-Discussed next steps with pt. Pt verbalized understanding and is agreeable with the plan.   Labs (all labs ordered are listed, but only abnormal results are displayed) Labs Reviewed  BASIC METABOLIC PANEL - Abnormal; Notable for the following:       Result Value   CO2 16 (*)    Glucose, Bld 225 (*)    Creatinine, Ser 1.39 (*)    Anion gap 19 (*)    All other components within normal limits  CBC - Abnormal; Notable for the following:    WBC 14.5 (*)    Platelets 402 (*)    All other components within normal limits  CBG MONITORING, ED - Abnormal; Notable for the following:    Glucose-Capillary 268 (*)    All other components within normal limits  URINALYSIS, ROUTINE W REFLEX MICROSCOPIC  CBG MONITORING, ED   EKG  EKG Interpretation None      Radiology Dg Abdomen Acute W/chest  Result Date: 02/17/2017 CLINICAL DATA:  Generalized abdominal pain with nausea and vomiting EXAM: DG ABDOMEN ACUTE W/ 1V CHEST COMPARISON:  CT 12/04/2016 FINDINGS: Single-view chest demonstrates no consolidation or effusion. Normal cardiomediastinal silhouette. Supine and upright views of the abdomen demonstrate no free air beneath the diaphragm. Diffuse  decreased bowel gas which is nonspecific. Small calcified phlebolith left pelvis. IMPRESSION: 1. No acute infiltrate or edema 2. Diffuse nonspecific decreased bowel gas. Electronically Signed   By: Jasmine Pang M.D.   On: 02/17/2017 03:54    Procedures Procedures   CRITICAL CARE Performed by: Shon Baton   Total critical care time: 30 minutes  Critical care time was exclusive of separately billable procedures and treating other patients.  Critical care was necessary to treat or prevent imminent or life-threatening deterioration.  Critical care was time spent personally by me on the following activities: development of treatment plan with patient and/or surrogate as well as nursing, discussions with consultants, evaluation of patient's response to treatment, examination of patient, obtaining history from patient or surrogate, ordering and performing treatments and interventions, ordering and review of laboratory studies, ordering and review of radiographic studies, pulse oximetry and re-evaluation of patient's condition.   Medications Ordered in ED Medications  sodium chloride 0.9 %  bolus 1,000 mL (1,000 mLs Intravenous New Bag/Given 02/17/17 0306)  ondansetron (ZOFRAN) injection 4 mg (4 mg Intravenous Given 02/17/17 0306)  sodium chloride 0.9 % bolus 1,000 mL (0 mLs Intravenous Stopped 02/17/17 0410)  insulin aspart (novoLOG) injection 10 Units (10 Units Intravenous Given 02/17/17 0410)    Initial Impression / Assessment and Plan / ED Course  I have reviewed the triage vital signs and the nursing notes.  Pertinent labs & imaging results that were available during my care of the patient were reviewed by me and considered in my medical decision making (see chart for details).     Patient presents with hyperglycemia and abdominal pain. History of diabetes. Reports hypoglycemia and took multiple glucose tablets. Since that time has been hyperglycemic and self-administered insulin.  Vital signs reassuring. Overall nontoxic. Distractible abdominal tenderness without focal tenderness. Initial blood sugar in the 260s. Patient was given fluids and Zofran. Lab work obtained and shows an anion gap of 19 with a bicarbonate of 16. Blood glucose 225; however, this is consistent with DKA. Patient was given a second liter of fluids and 10 units of insulin. Will go ahead and initiate insulin drip with D5 in an attempt to treat aggressively. He not fill this time he needs abdominal imaging and this may be related to hyperglycemia and DKA. Discussed with the hospitalist.    Final Clinical Impressions(s) / ED Diagnoses   Final diagnoses:  Diabetic ketoacidosis without coma associated with type 1 diabetes mellitus (HCC)  Generalized abdominal pain   New Prescriptions New Prescriptions   No medications on file   I personally performed the services described in this documentation, which was scribed in my presence. The recorded information has been reviewed and is accurate.     Shon BatonHorton, Courtney F, MD 02/17/17 [phone]

## 2017-02-18 DIAGNOSIS — K858 Other acute pancreatitis without necrosis or infection: Secondary | ICD-10-CM | POA: Diagnosis not present

## 2017-02-18 DIAGNOSIS — E039 Hypothyroidism, unspecified: Secondary | ICD-10-CM

## 2017-02-18 DIAGNOSIS — E1010 Type 1 diabetes mellitus with ketoacidosis without coma: Secondary | ICD-10-CM | POA: Diagnosis not present

## 2017-02-18 LAB — CBC
HCT: 41.2 % (ref 39.0–52.0)
Hemoglobin: 14 g/dL (ref 13.0–17.0)
MCH: 31.1 pg (ref 26.0–34.0)
MCHC: 34 g/dL (ref 30.0–36.0)
MCV: 91.6 fL (ref 78.0–100.0)
Platelets: 346 10*3/uL (ref 150–400)
RBC: 4.5 MIL/uL (ref 4.22–5.81)
RDW: 13.2 % (ref 11.5–15.5)
WBC: 10.9 10*3/uL — AB (ref 4.0–10.5)

## 2017-02-18 LAB — BASIC METABOLIC PANEL
ANION GAP: 9 (ref 5–15)
Anion gap: 6 (ref 5–15)
BUN: 9 mg/dL (ref 6–20)
BUN: 8 mg/dL (ref 6–20)
CALCIUM: 8.6 mg/dL — AB (ref 8.9–10.3)
CO2: 26 mmol/L (ref 22–32)
CO2: 23 mmol/L (ref 22–32)
Calcium: 8.8 mg/dL — ABNORMAL LOW (ref 8.9–10.3)
Chloride: 103 mmol/L (ref 101–111)
Chloride: 102 mmol/L (ref 101–111)
Creatinine, Ser: 1 mg/dL (ref 0.61–1.24)
Creatinine, Ser: 0.96 mg/dL (ref 0.61–1.24)
GFR calc Af Amer: >60 mL/min (ref >60)
GFR calc non Af Amer: >60 mL/min (ref >60)
Glucose, Bld: 117 mg/dL — ABNORMAL HIGH (ref 65–99)
Glucose, Bld: 202 mg/dL — ABNORMAL HIGH (ref 65–99)
Potassium: 3.6 mmol/L (ref 3.5–5.1)
Potassium: 3.4 mmol/L — ABNORMAL LOW (ref 3.5–5.1)
Sodium: 135 mmol/L (ref 135–145)
Sodium: 134 mmol/L — ABNORMAL LOW (ref 135–145)

## 2017-02-18 LAB — GLUCOSE, CAPILLARY
GLUCOSE-CAPILLARY: 200 mg/dL — AB (ref 65–99)
GLUCOSE-CAPILLARY: 215 mg/dL — AB (ref 65–99)
GLUCOSE-CAPILLARY: 229 mg/dL — AB (ref 65–99)
Glucose-Capillary: 169 mg/dL — ABNORMAL HIGH (ref 65–99)
Glucose-Capillary: 169 mg/dL — ABNORMAL HIGH (ref 65–99)
Glucose-Capillary: 178 mg/dL — ABNORMAL HIGH (ref 65–99)

## 2017-02-18 LAB — LIPASE, BLOOD: LIPASE: 800 U/L — AB (ref 11–51)

## 2017-02-18 MED ORDER — LEVOTHYROXINE SODIUM 75 MCG PO TABS
150.0000 ug | ORAL_TABLET | Freq: Every day | ORAL | Status: DC
  Administered 2017-02-18: 150 ug via ORAL

## 2017-02-18 MED ORDER — INSULIN LISPRO 100 UNIT/ML ~~LOC~~ SOLN: 1.0000 [IU] | Freq: Three times a day (TID) | SUBCUTANEOUS | 0 refills | Status: DC

## 2017-02-18 MED ORDER — INSULIN GLARGINE 100 UNIT/ML ~~LOC~~ SOLN
15.0000 [IU] | Freq: Once | SUBCUTANEOUS | Status: AC
  Administered 2017-02-18: 15 [IU] via SUBCUTANEOUS
  Filled 2017-02-18: qty 0.15

## 2017-02-18 MED ORDER — INSULIN ASPART 100 UNIT/ML ~~LOC~~ SOLN
0.0000 [IU] | Freq: Three times a day (TID) | SUBCUTANEOUS | Status: DC
  Administered 2017-02-18 (×2): 3 [IU] via SUBCUTANEOUS
  Administered 2017-02-18: 5 [IU] via SUBCUTANEOUS

## 2017-02-18 MED ORDER — INSULIN GLARGINE 100 UNIT/ML ~~LOC~~ SOLN: 35.0000 [IU] | Freq: Every day | SUBCUTANEOUS | 0 refills | Status: DC

## 2017-02-18 MED ORDER — INSULIN GLARGINE 100 UNIT/ML ~~LOC~~ SOLN: 35.0000 [IU] | Freq: Every day | SUBCUTANEOUS | Status: DC

## 2017-02-18 MED ORDER — INSULIN GLARGINE 100 UNIT/ML ~~LOC~~ SOLN
20.0000 [IU] | Freq: Every day | SUBCUTANEOUS | Status: DC
  Administered 2017-02-18: 20 [IU] via SUBCUTANEOUS
  Filled 2017-02-18: qty 0.2

## 2017-02-18 MED ORDER — PANTOPRAZOLE SODIUM 40 MG PO TBEC: 40.0000 mg | DELAYED_RELEASE_TABLET | Freq: Every day | ORAL | 0 refills | Status: DC

## 2017-02-18 NOTE — Progress Notes (Signed)
Markedly improved this morning-hardly any abdominal pain Tolerating regular diet Transitioned off Insulin gtt Abd-soft non tender-much better than yesterday Lipase still up-but benign abdomen Plan is to d/c home later today, he needs assistance setting up with a PCP as he moved to [place] area. Will need outpatient GI work up for recurrent pancreatitis-apparently this is his second episode See d/c summary for details.

## 2017-02-18 NOTE — Progress Notes (Signed)
PROGRESS NOTE [facility]   Devoria AlbeCarter Arganbright   ZOX:[id number] DOB: 08/25/1996  DOA: 02/17/2017 PCP: Renaye RakersBland, Veita, MD   Brief Narrative:  21 year-old male with Hx of diabetes mellitus type 1 and hypothyroidism who was admitted 1 month ago for DKA presented with nausea, vomiting, and elevated blood sugar. Found to have acute pancreatitis and possible DKA, admitted to the hospitalist service for further evaluation and treatment. Markedly improved today.   Assessment & Plan:   DKA, Type 1 Diabetes: DKA has resolved-anion gap has closed, tolerating diet well, transition off of insulin gtt.  Pancreatitis: Lipase still elevated. US showed normal RUQ. Outpatient GI workup since this is his second time with pancreatitis.  Hypothyroidism:Controlled with lovethyroxine   DVT prophylaxis: SCD's Code Status: Full Family Communication: not at bedside Disposition Plan: discharge home today    Consultants:  - None  Procedures:  None  Antimicrobials: None  Subjective: Patient states he is not having much abdominal pain. Minimal Nausea. Patient is tolerating diet well. Denies vomiting or diarhea, SOB, chest pain.  Objective: Vitals:   02/17/17 2046 02/18/17 0009 02/18/17 0456 02/18/17 0720  BP: (!) 134/97 (!) 139/92 (!) 139/97 (!) 141/89  Pulse: 78 77 72 70  Resp: 18 18 18 14   Temp: 98.7 F (37.1 C) 98.5 F (36.9 C)  98.6 F (37 C)  TempSrc: Oral Oral  Oral  SpO2:  100% 100% 100%  Weight:      Height:        Intake/Output Summary (Last 24 hours) at 02/18/17 0936 Last data filed at 02/18/17 0800  Gross per 24 hour  Intake          3401.16 ml  Output                0 ml  Net          3401.16 ml   Filed Weights   02/17/17 0217  Weight: 150 lb (68 kg)    Examination:  General exam: Appears stated age and no acute distress Respiratory system: Clear to auscultation. No wheezes,crackle or rhonchi Cardiovascular system: S1 & S2 heard, RRR. No JVD, murmurs, rubs  or gallops Gastrointestinal system: Abdomen is nondistended, soft and nontender. Central nervous system: Alert and oriented. No focal neurological deficits. Extremities: No pedal edema Skin: No rashes, lesions or ulcers Psychiatry: Judgement and insight appear normal. Mood & affect appropriate.    Data Reviewed: I have personally reviewed following labs and imaging studies  CBC:  Recent Labs Lab 02/17/17 0230 02/17/17 0619 02/18/17 0429  WBC 14.5* 11.2* 10.9*  HGB 15.6 14.2 14.0  HCT 45.7 42.2 41.2  MCV 92.0 91.1 91.6  PLT 402* 376 346   Basic Metabolic Panel:  Recent Labs Lab 02/17/17 0619 02/17/17 0935 02/17/17 1357 02/17/17 2325 02/18/17 0429  NA 139 137 136 135 134*  K 4.0 3.8 3.9 3.6 3.4*  CL 105 105 103 103 102  CO2 20* 18* 20* 26 23  GLUCOSE 113* 287* 212* 117* 202*  BUN 16 15 13 9 8   CREATININE 0.89 1.23 1.33* 1.00 0.96  CALCIUM 8.7* 9.0 9.1 8.8* 8.6*  MG 2.0  --   --   --   --    GFR: Estimated Creatinine Clearance: 111.7 mL/min (by C-G formula based on SCr of 0.96 mg/dL). Liver Function Tests:  Recent Labs Lab 02/17/17 0619  AST 24  ALT 57  ALKPHOS 108  BILITOT 1.5*  PROT 6.7  ALBUMIN 3.7    Recent Labs  Lab 02/17/17 0619 02/18/17 0429  LIPASE 842* 800*   No results for input(s): AMMONIA in the last 168 hours. Coagulation Profile: No results for input(s): INR, PROTIME in the last 168 hours. Cardiac Enzymes: No results for input(s): CKTOTAL, CKMB, CKMBINDEX, TROPONINI in the last 168 hours. BNP (last 3 results) No results for input(s): PROBNP in the last 8760 hours. HbA1C: No results for input(s): HGBA1C in the last 72 hours. CBG:  Recent Labs Lab 02/18/17 0018 02/18/17 0109 02/18/17 0244 02/18/17 0454 02/18/17 0719  GLUCAP 169* 169* 200* 215* 229*   Lipid Profile: No results for input(s): CHOL, HDL, LDLCALC, TRIG, CHOLHDL, LDLDIRECT in the last 72 hours. Thyroid Function Tests:  Recent Labs  02/17/17 0619  TSH 12.484*     Anemia Panel: No results for input(s): VITAMINB12, FOLATE, FERRITIN, TIBC, IRON, RETICCTPCT in the last 72 hours. Sepsis Labs: No results for input(s): PROCALCITON, LATICACIDVEN in the last 168 hours.  Recent Results (from the past 240 hour(s))  MRSA PCR Screening     Status: None   Collection Time: 02/17/17 11:12 AM  Result Value Ref Range Status   MRSA by PCR NEGATIVE NEGATIVE Final    Comment:        The GeneXpert MRSA Assay (FDA approved for NASAL specimens only), is one component of a comprehensive MRSA colonization surveillance program. It is not intended to diagnose MRSA infection nor to guide or monitor treatment for MRSA infections.          Radiology Studies: Dg Abdomen Acute W/chest  Result Date: 02/17/2017 CLINICAL DATA:  Generalized abdominal pain with nausea and vomiting EXAM: DG ABDOMEN ACUTE W/ 1V CHEST COMPARISON:  CT 12/04/2016 FINDINGS: Single-view chest demonstrates no consolidation or effusion. Normal cardiomediastinal silhouette. Supine and upright views of the abdomen demonstrate no free air beneath the diaphragm. Diffuse decreased bowel gas which is nonspecific. Small calcified phlebolith left pelvis. IMPRESSION: 1. No acute infiltrate or edema 2. Diffuse nonspecific decreased bowel gas. Electronically Signed   By: Jasmine Pang M.D.   On: 02/17/2017 03:54   US Abdomen Limited Ruq  Result Date: 02/17/2017 CLINICAL DATA:  Pancreatitis EXAM: ULTRASOUND ABDOMEN LIMITED RIGHT UPPER QUADRANT COMPARISON:  Abdominal CT 12/04/2016 FINDINGS: Gallbladder: No gallstones or wall thickening visualized. No sonographic Murphy sign noted by sonographer. Common bile duct: Diameter: 8 mm.  Where visualized, no filling defect. Liver: No focal lesion identified. Within normal limits in parenchymal echogenicity. Antegrade flow in the main portal vein. IMPRESSION: Normal right upper quadrant ultrasound. Electronically Signed   By: Marnee Spring M.D.   On: 02/17/2017 15:57       Scheduled Meds: . enoxaparin (LOVENOX) injection  40 mg Subcutaneous Q24H  . insulin aspart  0-15 Units Subcutaneous TID WC  . [START ON 02/19/2017] insulin glargine  35 Units Subcutaneous QHS  . insulin regular  0-10 Units Intravenous TID WC  . levothyroxine  150 mcg Oral QAC breakfast  . pantoprazole (PROTONIX) IV  40 mg Intravenous Q24H   Continuous Infusions: . doxycycline (VIBRAMYCIN) IV Stopped (02/17/17 2339)  . insulin (NOVOLIN-R) infusion Stopped (02/18/17 0255)     LOS: 0 days     Massai Hankerson, PA-s  If 7PM-7AM, please contact night-coverage [email or web address] Password [facility] 02/18/2017, 9:36 AM

## 2017-02-18 NOTE — Plan of Care (Signed)
Problem: Activity: Goal: Risk for activity intolerance will decrease Outcome: Completed/Met Date Met: 02/18/17 Patient able to ambulate alone.

## 2017-02-18 NOTE — Progress Notes (Signed)
Inpatient Diabetes Program Recommendations  AACE/ADA: New Consensus Statement on Inpatient Glycemic Control (2015)  Target Ranges:  Prepandial:   less than 140 mg/dL      Peak postprandial:   less than 180 mg/dL (1-2 hours)      Critically ill patients:  140 - 180 mg/dL   Lab Results  Component Value Date   GLUCAP 178 (H) 02/18/2017   HGBA1C 11.8 (H) 12/15/2016    Review of Glycemic Control  Long discussion with pt this am regarding importance of controlling his blood sugars at home. Has meter and supplies. Needs PCP or endo. Discussed hypoglycemia s/s and treatment. Pt states he wants to get back on track and get his diabetes under control. Stressed importance of follow-up with MD who he feels comfortable with.  Has f/u appt scheduled.  Thank you. Ailene Ardshonda Alyss Granato, RD, LDN, CDE Inpatient Diabetes Coordinator [phone]

## 2017-02-18 NOTE — Plan of Care (Signed)
Problem: Health Behavior/Discharge Planning: Goal: Ability to manage health-related needs will improve Outcome: Completed/Met Date Met: 02/18/17 Patient given all discharge instructions and written prescriptions.

## 2017-02-18 NOTE — Progress Notes (Signed)
Pt tolerating clears well with no N/V or abdominal pain.  Anion gap 6, CBG 169.  K. Schorr notified of the above.  Orders received for carb modified diet and transition off of insulin gtt.  Lantus 20 units subQ given; sandwich tray and Sprite Zero given.  Will continue to monitor.  David Ramsey, Ceasia Elwell Gray

## 2017-02-18 NOTE — Discharge Instructions (Signed)
Abdominal Pain, Adult Abdominal pain can be caused by many things. Often, abdominal pain is not serious and it gets better with no treatment or by being treated at home. However, sometimes abdominal pain is serious. Your health care provider will do a medical history and a physical exam to try to determine the cause of your abdominal pain. Follow these instructions at home:  Take over-the-counter and prescription medicines only as told by your health care provider. Do not take a laxative unless told by your health care provider.  Drink enough fluid to keep your urine clear or pale yellow.  Watch your condition for any changes.  Keep all follow-up visits as told by your health care provider. This is important. Contact a health care provider if:  Your abdominal pain changes or gets worse.  You are not hungry or you lose weight without trying.  You are constipated or have diarrhea for more than 2-3 days.  You have pain when you urinate or have a bowel movement.  Your abdominal pain wakes you up at night.  Your pain gets worse with meals, after eating, or with certain foods.  You are throwing up and cannot keep anything down.  You have a fever. Get help right away if:  Your pain does not go away as soon as your health care provider told you to expect.  You cannot stop throwing up.  Your pain is only in areas of the abdomen, such as the right side or the left lower portion of the abdomen.  You have bloody or black stools, or stools that look like tar.  You have severe pain, cramping, or bloating in your abdomen.  You have signs of dehydration, such as: ? Dark urine, very little urine, or no urine. ? Cracked lips. ? Dry mouth. ? Sunken eyes. ? Sleepiness. ? Weakness. This information is not intended to replace advice given to you by your health care provider. Make sure you discuss any questions you have with your health care provider. Document Released: 06/03/2005 Document  Revised: 03/13/2016 Document Reviewed: 02/05/2016 Elsevier Interactive Patient Education  2017 [employer]   Acute Pancreatitis Acute pancreatitis is a condition in which the pancreas suddenly becomes irritated and swollen (has inflammation). The pancreas is a gland that is located behind the stomach. It produces enzymes that help to digest food. The pancreas also releases the hormones glucagon and insulin, which help to regulate blood sugar. Damage to the pancreas occurs when the digestive enzymes from the pancreas are activated before they are released into the intestine. Most acute attacks last a couple of days and can cause serious problems. Some people become dehydrated and develop low blood pressure. In severe cases, bleeding into the pancreas can lead to shock and can be life-threatening. The lungs, heart, and kidneys may fail. What are the causes? The most common causes of this condition are:  Alcohol abuse.  Gallstones.  Other causes include:  Certain medicines.  Exposure to certain chemicals.  Infection.  Damage caused by an accident (trauma).  Abdominal surgery.  In some cases, the cause may not be known. What are the signs or symptoms? Symptoms of this condition include:  Pain in the upper abdomen that may radiate to the back.  Tenderness and swelling of the abdomen.  Nausea and vomiting.  How is this diagnosed? This condition may be diagnosed based on:  A physical exam.  Blood tests.  Imaging tests, such as X-rays, CT scans, or an ultrasound of the abdomen.  How is this treated? Treatment for this condition usually requires a stay in the hospital. Treatment may include:  Pain medicine.  Fluid replacement through an IV tube.  Placing a tube in the stomach to remove stomach contents and to control vomiting (NG tube, or nasogastric tube).  Not eating for 3-4 days. This gives the pancreas a rest, because enzymes are not being produced that can cause  further damage.  Antibiotic medicines, if your condition is caused by an infection.  Surgery on the pancreas or gallbladder.  Follow these instructions at home: Eating and drinking  Follow instructions from your health care provider about diet. This may involve avoiding alcohol and decreasing the amount of fat in your diet.  Eat smaller, more frequent meals. This reduces the amount of digestive fluids that the pancreas produces.  Drink enough fluid to keep your urine clear or pale yellow.  Do not drink alcohol if it caused your condition. General instructions  Take over-the-counter and prescription medicines only as told by your health care provider.  Do not use any tobacco products, such as cigarettes, chewing tobacco, and e-cigarettes. If you need help quitting, ask your health care provider.  Get plenty of rest.  If directed, check your blood sugar at home as told by your health care provider.  Keep all follow-up visits as told by your health care provider. This is important. Contact a health care provider if:  You do not recover as quickly as expected.  You develop new or worsening symptoms.  You have persistent pain, weakness, or nausea.  You recover and then have another episode of pain.  You have a fever. Get help right away if:  You cannot eat or keep fluids down.  Your pain becomes severe.  Your skin or the white part of your eyes turns yellow (jaundice).  You vomit.  You feel dizzy or you faint.  Your blood sugar is high (over 300 mg/dL). This information is not intended to replace advice given to you by your health care provider. Make sure you discuss any questions you have with your health care provider. Document Released: 08/24/2005 Document Revised: 01/01/2016 Document Reviewed: 05/28/2015 Elsevier Interactive Patient Education  2018 [employer].   Diabetic Ketoacidosis Diabetic ketoacidosis is a life-threatening complication of diabetes. If it  is not treated, it can cause severe dehydration and organ damage and can lead to a coma or death. What are the causes? This condition develops when there is not enough of the hormone insulin in the body. Insulin helps the body to break down sugar for energy. Without insulin, the body cannot break down sugar, so it breaks down fats instead. This leads to the production of acids that are called ketones. Ketones are poisonous at high levels. This condition can be triggered by:  Stress on the body that is brought on by an illness.  Medicines that raise blood glucose levels.  Not taking diabetes medicine.  What are the signs or symptoms? Symptoms of this condition include:  Fatigue.  Weight loss.  Excessive thirst.  Light-headedness.  Fruity or sweet-smelling breath.  Excessive urination.  Vision changes.  Confusion or irritability.  Nausea.  Vomiting.  Rapid breathing.  Abdominal pain.  Feeling flushed.  How is this diagnosed? This condition is diagnosed based on a medical history, a physical exam, and blood tests. You may also have a urine test that checks for ketones. How is this treated? This condition may be treated with:  Fluid replacement. This may be  done to correct dehydration.  Insulin injections. These may be given through the skin or through an IV tube.  Electrolyte replacement. Electrolytes, such as potassium and sodium, may be given in pill form or through an IV tube.  Antibiotic medicines. These may be prescribed if your condition was caused by an infection.  Follow these instructions at home: Eating and drinking  Drink enough fluids to keep your urine clear or pale yellow.  If you cannot eat, alternate between drinking fluids with sugar (such as juice) and salty fluids (such as broth or bouillon).  If you can eat, follow your usual diet and drink sugar-free liquids, such as water. Other Instructions   Take insulin as directed by your health  care provider. Do not skip insulin injections. Do not use expired insulin.  If your blood sugar is over 240 mg/dL, monitor your urine ketones every 4-6 hours.  If you were prescribed an antibiotic medicine, finish all of it even if you start to feel better.  Rest and exercise only as directed by your health care provider.  If you get sick, call your health care provider and begin treatment quickly. Your body often needs extra insulin to fight an illness.  Check your blood glucose levels regularly. If your blood glucose is high, drink plenty of fluids. This helps to flush out ketones. Contact a health care provider if:  Your blood glucose level is too high or too low.  You have ketones in your urine.  You have a fever.  You cannot eat.  You cannot tolerate fluids.  You have been vomiting for more than 2 hours.  You continue to have symptoms of this condition.  You develop new symptoms. Get help right away if:  Your blood glucose levels continue to be high (elevated).  Your monitor reads high even when you are taking insulin.  You faint.  You have chest pain.  You have trouble breathing.  You have a sudden, severe headache.  You have sudden weakness in one arm or one leg.  You have sudden trouble speaking or swallowing.  You have vomiting or diarrhea that gets worse after 3 hours.  You feel severely fatigued.  You have trouble thinking.  You have abdominal pain.  You are severely dehydrated. Symptoms of severe dehydration include: ? Extreme thirst. ? Dry mouth. ? Blue lips. ? Cold hands and feet. ? Rapid breathing. This information is not intended to replace advice given to you by your health care provider. Make sure you discuss any questions you have with your health care provider. Document Released: 08/21/2000 Document Revised: 01/30/2016 Document Reviewed: 08/01/2014 Elsevier Interactive Patient Education  2017 [employer].

## 2017-02-18 NOTE — Progress Notes (Signed)
Tolerated regular diet well.  CBG 200.  Insulin gtt and IV fluids d/c'd and Novolog 3 units subQ given per Merdis DelayK. Schorr, NP orders.  Will continue to monitor.  Alonza Bogusuvall, Charity Tessier Gray

## 2017-02-18 NOTE — Discharge Summary (Signed)
PATIENT DETAILS Name: David Ramsey Age: 21 y.o. Sex: male Date of Birth: [birth year] MRN: [id number]. Admitting Physician: Eduard Clos, MD [id number], Dibas, MD  Admit Date: 02/17/2017 Discharge date: 02/18/2017  Recommendations for Outpatient Follow-up:  1. Follow up with Dr Docia Chuck on 6/18 2. Suggest outpatient GI referral for evaluation of recurrent pancreatitis (see below)  Admitted From:  Home  Disposition: Home   Home Health: No  Equipment/Devices: None  Discharge Condition: Stable  CODE STATUS: FULL CODE  Diet recommendation:  Carb Modified   Brief Summary: See H&P, Labs, Consult and Test reports for all details in brief, patient is a 21 year old with history of type I DM apparently on insulin since he was 4 years of admitted with vomiting and abdominal pain, found to have DKA and pancreatitis. Admitted and started on IV insulin-he rapidly improved, he was tapered off IV insulin and started on subcutaneous insulin, and diet was slowly advanced. By day of discharge tolerating diet with hardly any abdominal pain. See below for further details   Brief Hospital Course: DKA: Anion gap has closed, he was treated with IV insulin infusion and IV fluids. Once his diet was stable, he was transitioned to subcutaneous insulin. CBGs remained stable, no further vomiting-being discharged home in a stable manner. He has been asked to follow-up with his new PCP (just moved to [facility]) for further optimization of his insulin regimen.  History of DM-1: Continue usual insulin regimen on discharge-per patient-he has been on insulin since he was 21 years old. Patient provided with a prescription for NovoLog and Lantus-as he is almost out of insulin. He has been asked to follow-up with his primary care practitioner for further optimization.  Pancreatitis: Patient did have epigastric pain on admission-he also had significantly elevated lipase levels. Right upper quadrant  ultrasound was negative for cholelithiasis, he denies significant alcohol history (only a social drinker). He was managed with supportive measures-kept nothing by mouth while he was on insulin infusion-and slowly started on clear liquids. By day of discharge he was easily able to tolerate a regular diet, with hardly any abdominal pain. His abdominal exam today has markedly improved compared to yesterday. Apparently this is his second episode of pancreatitis, he had a prior episode of pancreatitis in the setting of DKA in January 2018 for which he was admitted to [facility]. Given recurrent episodes of pancreatitis-suspect a outpatient GI evaluation could be entertained for further workup.  Hypothyroidism: Continue with levothyroxine.  Bilateral lower extremity cellulitis: He had mild erythema surrounding some of his scabs in his lower extremities-that has since resolved-he was briefly placed on doxycycline-however this is not being continued on discharge as he does not have any active cellulitis on exam this morning.  Procedures/Studies: None  Discharge Diagnoses:  Principal Problem:   DKA, type 1 (HCC) Active Problems:   Hypothyroidism (acquired)   Nausea & vomiting   Discharge Instructions:  Activity:  As tolerated with Full fall precautions use walker/cane & assistance as needed  Discharge Instructions    Call MD for:  persistant nausea and vomiting    Complete by:  As directed    Call MD for:  severe uncontrolled pain    Complete by:  As directed    Diet Carb Modified    Complete by:  As directed    Discharge instructions    Complete by:  As directed    Follow with Primary MD in 1 week  Please get a complete blood count and chemistry panel  checked by your Primary MD at your next visit, and again as instructed by your Primary MD.  Get Medicines reviewed and adjusted: Please take all your medications with you for your next visit with your Primary  MD  Laboratory/radiological data: Please request your Primary MD to go over all hospital tests and procedure/radiological results at the follow up, please ask your Primary MD to get all Hospital records sent to his/her office.  In some cases, they will be blood work, cultures and biopsy results pending at the time of your discharge. Please request that your primary care M.D. follows up on these results.  Also Note the following: If you experience worsening of your admission symptoms, develop shortness of breath, life threatening emergency, suicidal or homicidal thoughts you must seek medical attention immediately by calling 911 or calling your MD immediately  if symptoms less severe.  You must read complete instructions/literature along with all the possible adverse reactions/side effects for all the Medicines you take and that have been prescribed to you. Take any new Medicines after you have completely understood and accpet all the possible adverse reactions/side effects.   Do not drive when taking Pain medications or sleeping medications (Benzodaizepines)  Do not take more than prescribed Pain, Sleep and Anxiety Medications. It is not advisable to combine anxiety,sleep and pain medications without talking with your primary care practitioner  Special Instructions: If you have smoked or chewed Tobacco  in the last 2 yrs please stop smoking, stop any regular Alcohol  and or any Recreational drug use.  Wear Seat belts while driving.  Please note: You were cared for by a hospitalist during your hospital stay. Once you are discharged, your primary care physician will handle any further medical issues. Please note that NO REFILLS for any discharge medications will be authorized once you are discharged, as it is imperative that you return to your primary care physician (or establish a relationship with a primary care physician if you do not have one) for your post hospital discharge needs so that  they can reassess your need for medications and monitor your lab values.   Increase activity slowly    Complete by:  As directed      Allergies as of 02/18/2017      Reactions   Ibuprofen Other (See Comments)   WANT TO AVOID LIVER FAILURE, SO PATIENT WAS TOLD NOT TO TAKE      Medication List    TAKE these medications   insulin glargine 100 UNIT/ML injection Commonly known as:  LANTUS Inject 0.35 mLs (35 Units total) into the skin at bedtime.   insulin lispro 100 UNIT/ML injection Commonly known as:  HUMALOG Inject 0.01-0.15 mLs (1-15 Units total) into the skin 3 (three) times daily before meals. Sliding scale   levothyroxine 150 MCG tablet Commonly known as:  SYNTHROID, LEVOTHROID Take 150 mcg by mouth daily.   ondansetron 4 MG disintegrating tablet Commonly known as:  ZOFRAN ODT Take 1 tablet (4 mg total) by mouth every 8 (eight) hours as needed for nausea or vomiting.   pantoprazole 40 MG tablet Commonly known as:  PROTONIX Take 1 tablet (40 mg total) by mouth daily.      Follow-up Information    Koirala, Dibas, MD Follow up on 02/22/2017.   Specialty:  Family Medicine Why:  appointment at 9:45 am, please be there by 9:30 am. Contact information: [address] [place] [address] [phone]  Allergies  Allergen Reactions  . Ibuprofen Other (See Comments)    WANT TO AVOID LIVER FAILURE, SO PATIENT WAS TOLD NOT TO TAKE    Consultations:   None  Other Procedures/Studies: Dg Abdomen Acute W/chest  Result Date: 02/17/2017 CLINICAL DATA:  Generalized abdominal pain with nausea and vomiting EXAM: DG ABDOMEN ACUTE W/ 1V CHEST COMPARISON:  CT 12/04/2016 FINDINGS: Single-view chest demonstrates no consolidation or effusion. Normal cardiomediastinal silhouette. Supine and upright views of the abdomen demonstrate no free air beneath the diaphragm. Diffuse decreased bowel gas which is nonspecific. Small calcified phlebolith left pelvis.  IMPRESSION: 1. No acute infiltrate or edema 2. Diffuse nonspecific decreased bowel gas. Electronically Signed   By: Jasmine PangKim  Fujinaga M.D.   On: 02/17/2017 03:54   [place] Abdomen Limited Ruq  Result Date: 02/17/2017 CLINICAL DATA:  Pancreatitis EXAM: ULTRASOUND ABDOMEN LIMITED RIGHT UPPER QUADRANT COMPARISON:  Abdominal CT 12/04/2016 FINDINGS: Gallbladder: No gallstones or wall thickening visualized. No sonographic Murphy sign noted by sonographer. Common bile duct: Diameter: 8 mm.  Where visualized, no filling defect. Liver: No focal lesion identified. Within normal limits in parenchymal echogenicity. Antegrade flow in the main portal vein. IMPRESSION: Normal right upper quadrant ultrasound. Electronically Signed   By: Marnee SpringJonathon  Watts M.D.   On: 02/17/2017 15:57     TODAY-DAY OF DISCHARGE:  Subjective:   David Ramsey today has no headache,no chest abdominal pain,no new weakness tingling or numbness, feels much better wants to go home today.   Objective:   Blood pressure (!) 141/89, pulse 70, temperature 98.6 F (37 C), temperature source Oral, resp. rate 14, height 5\' 6"  (1.676 m), weight 68 kg (150 lb), SpO2 100 %.  Intake/Output Summary (Last 24 hours) at 02/18/17 0959 Last data filed at 02/18/17 0800  Gross per 24 hour  Intake          3401.16 ml  Output                0 ml  Net          3401.16 ml   Filed Weights   02/17/17 0217  Weight: 68 kg (150 lb)    Exam: Awake Alert, Oriented *3, No new F.N deficits, Normal affect [place].AT,PERRAL Supple Neck,No JVD, No cervical lymphadenopathy appriciated.  Symmetrical Chest wall movement, Good air movement bilaterally, CTAB RRR,No Gallops,Rubs or new Murmurs, No Parasternal Heave +ve B.Sounds, Abd Soft, Non tender, No organomegaly appriciated, No rebound -guarding or rigidity. No Cyanosis, Clubbing or edema, No new Rash or bruise   PERTINENT RADIOLOGIC STUDIES: Dg Abdomen Acute W/chest  Result Date: 02/17/2017 CLINICAL DATA:   Generalized abdominal pain with nausea and vomiting EXAM: DG ABDOMEN ACUTE W/ 1V CHEST COMPARISON:  CT 12/04/2016 FINDINGS: Single-view chest demonstrates no consolidation or effusion. Normal cardiomediastinal silhouette. Supine and upright views of the abdomen demonstrate no free air beneath the diaphragm. Diffuse decreased bowel gas which is nonspecific. Small calcified phlebolith left pelvis. IMPRESSION: 1. No acute infiltrate or edema 2. Diffuse nonspecific decreased bowel gas. Electronically Signed   By: Jasmine PangKim  Fujinaga M.D.   On: 02/17/2017 03:54   [place] Abdomen Limited Ruq  Result Date: 02/17/2017 CLINICAL DATA:  Pancreatitis EXAM: ULTRASOUND ABDOMEN LIMITED RIGHT UPPER QUADRANT COMPARISON:  Abdominal CT 12/04/2016 FINDINGS: Gallbladder: No gallstones or wall thickening visualized. No sonographic Murphy sign noted by sonographer. Common bile duct: Diameter: 8 mm.  Where visualized, no filling defect. Liver: No focal lesion identified. Within normal limits in parenchymal echogenicity. Antegrade flow in the main portal  vein. IMPRESSION: Normal right upper quadrant ultrasound. Electronically Signed   By: Marnee Spring M.D.   On: 02/17/2017 15:57     PERTINENT LAB RESULTS: CBC:  Recent Labs  02/17/17 0619 02/18/17 0429  WBC 11.2* 10.9*  HGB 14.2 14.0  HCT 42.2 41.2  PLT 376 346   CMET CMP     Component Value Date/Time   NA 134 (L) 02/18/2017 0429   K 3.4 (L) 02/18/2017 0429   CL 102 02/18/2017 0429   CO2 23 02/18/2017 0429   GLUCOSE 202 (H) 02/18/2017 0429   BUN 8 02/18/2017 0429   CREATININE 0.96 02/18/2017 0429   CALCIUM 8.6 (L) 02/18/2017 0429   PROT 6.7 02/17/2017 0619   ALBUMIN 3.7 02/17/2017 0619   AST 24 02/17/2017 0619   ALT 57 02/17/2017 0619   ALKPHOS 108 02/17/2017 0619   BILITOT 1.5 (H) 02/17/2017 0619   GFRNONAA >60 02/18/2017 0429   GFRAA >60 02/18/2017 0429    GFR Estimated Creatinine Clearance: 111.7 mL/min (by C-G formula based on SCr of 0.96  mg/dL).  Recent Labs  02/17/17 0619 02/18/17 0429  LIPASE 842* 800*   No results for input(s): CKTOTAL, CKMB, CKMBINDEX, TROPONINI in the last 72 hours. Invalid input(s): POCBNP No results for input(s): DDIMER in the last 72 hours. No results for input(s): HGBA1C in the last 72 hours. No results for input(s): CHOL, HDL, LDLCALC, TRIG, CHOLHDL, LDLDIRECT in the last 72 hours.  Recent Labs  02/17/17 0619  TSH 12.484*   No results for input(s): VITAMINB12, FOLATE, FERRITIN, TIBC, IRON, RETICCTPCT in the last 72 hours. Coags: No results for input(s): INR in the last 72 hours.  Invalid input(s): PT Microbiology: Recent Results (from the past 240 hour(s))  MRSA PCR Screening     Status: None   Collection Time: 02/17/17 11:12 AM  Result Value Ref Range Status   MRSA by PCR NEGATIVE NEGATIVE Final    Comment:        The GeneXpert MRSA Assay (FDA approved for NASAL specimens only), is one component of a comprehensive MRSA colonization surveillance program. It is not intended to diagnose MRSA infection nor to guide or monitor treatment for MRSA infections.     FURTHER DISCHARGE INSTRUCTIONS:  Get Medicines reviewed and adjusted: Please take all your medications with you for your next visit with your Primary MD  Laboratory/radiological data: Please request your Primary MD to go over all hospital tests and procedure/radiological results at the follow up, please ask your Primary MD to get all Hospital records sent to his/her office.  In some cases, they will be blood work, cultures and biopsy results pending at the time of your discharge. Please request that your primary care M.D. goes through all the records of your hospital data and follows up on these results.  Also Note the following: If you experience worsening of your admission symptoms, develop shortness of breath, life threatening emergency, suicidal or homicidal thoughts you must seek medical attention immediately by  calling 911 or calling your MD immediately  if symptoms less severe.  You must read complete instructions/literature along with all the possible adverse reactions/side effects for all the Medicines you take and that have been prescribed to you. Take any new Medicines after you have completely understood and accpet all the possible adverse reactions/side effects.   Do not drive when taking Pain medications or sleeping medications (Benzodaizepines)  Do not take more than prescribed Pain, Sleep and Anxiety Medications. It is not advisable to  combine anxiety,sleep and pain medications without talking with your primary care practitioner  Special Instructions: If you have smoked or chewed Tobacco  in the last 2 yrs please stop smoking, stop any regular Alcohol  and or any Recreational drug use.  Wear Seat belts while driving.  Please note: You were cared for by a hospitalist during your hospital stay. Once you are discharged, your primary care physician will handle any further medical issues. Please note that NO REFILLS for any discharge medications will be authorized once you are discharged, as it is imperative that you return to your primary care physician (or establish a relationship with a primary care physician if you do not have one) for your post hospital discharge needs so that they can reassess your need for medications and monitor your lab values.  Total Time spent coordinating discharge including counseling, education and face to face time equals 45 minutes.  SignedJeoffrey Massed 02/18/2017 9:59 AM

## 2017-02-18 NOTE — Care Management Note (Signed)
Case Management Note  Patient Details  Name: David Ramsey MRN: [id number] Date of Birth: 12/27/1995  Subjective/Objective: Pt presented for DKA- N/V and elevated blood sugar. Pt plans to return home once stable.                    Action/Plan: Appointment established for Primary Care Needs. No further needs identified by CM.   Expected Discharge Date:                  Expected Discharge Plan:  Home/Self Care  In-House Referral:  NA  Discharge planning Services  CM Consult, Follow-up appt scheduled  Post Acute Care Choice:  NA Choice offered to:  NA  DME Arranged:  N/A DME Agency:  NA  HH Arranged:  NA HH Agency:  NA  Status of Service:  Completed, signed off  If discussed at Long Length of Stay Meetings, dates discussed:    Additional Comments:  Gala LewandowskyGraves-Bigelow, Brehanna Deveny Kaye, RN 02/18/2017, 11:39 AM

## 2017-02-18 NOTE — Progress Notes (Signed)
Pt states he is "starving."  Denies nausea or abdominal pain at this time.  Abdominal US negative.  CBG within target range.  Merdis DelayK. Schorr, NP notified and clear liquid diet ordered.  Will continue to monitor.  Alonza Bogusuvall, Callaway Hailes Gray

## 2017-02-18 NOTE — Progress Notes (Addendum)
Pt tolerating clear liquids well.  Has had 1 cup of chicken broth and 2 cans of Sprite Zero.  Denies abdominal pain, N/V.  Alonza Bogusuvall, Kem Parcher Gray

## 2017-03-21 DIAGNOSIS — Z833 Family history of diabetes mellitus: Secondary | ICD-10-CM | POA: Diagnosis not present

## 2017-03-21 DIAGNOSIS — K2970 Gastritis, unspecified, without bleeding: Secondary | ICD-10-CM | POA: Diagnosis not present

## 2017-03-21 DIAGNOSIS — Z794 Long term (current) use of insulin: Secondary | ICD-10-CM | POA: Diagnosis not present

## 2017-03-21 DIAGNOSIS — E878 Other disorders of electrolyte and fluid balance, not elsewhere classified: Secondary | ICD-10-CM | POA: Diagnosis not present

## 2017-03-21 DIAGNOSIS — R0602 Shortness of breath: Secondary | ICD-10-CM | POA: Diagnosis not present

## 2017-03-21 DIAGNOSIS — F329 Major depressive disorder, single episode, unspecified: Secondary | ICD-10-CM | POA: Diagnosis not present

## 2017-03-21 DIAGNOSIS — R112 Nausea with vomiting, unspecified: Secondary | ICD-10-CM | POA: Diagnosis not present

## 2017-03-21 DIAGNOSIS — R1011 Right upper quadrant pain: Secondary | ICD-10-CM | POA: Diagnosis not present

## 2017-03-21 DIAGNOSIS — R740 Nonspecific elevation of levels of transaminase and lactic acid dehydrogenase [LDH]: Secondary | ICD-10-CM | POA: Diagnosis not present

## 2017-03-21 DIAGNOSIS — Z7722 Contact with and (suspected) exposure to environmental tobacco smoke (acute) (chronic): Secondary | ICD-10-CM | POA: Diagnosis not present

## 2017-03-21 DIAGNOSIS — E1110 Type 2 diabetes mellitus with ketoacidosis without coma: Secondary | ICD-10-CM | POA: Diagnosis not present

## 2017-03-21 DIAGNOSIS — I10 Essential (primary) hypertension: Secondary | ICD-10-CM | POA: Diagnosis not present

## 2017-03-21 DIAGNOSIS — Z9114 Patient's other noncompliance with medication regimen: Secondary | ICD-10-CM | POA: Diagnosis not present

## 2017-03-21 DIAGNOSIS — E1010 Type 1 diabetes mellitus with ketoacidosis without coma: Secondary | ICD-10-CM | POA: Diagnosis not present

## 2017-03-21 DIAGNOSIS — F909 Attention-deficit hyperactivity disorder, unspecified type: Secondary | ICD-10-CM | POA: Diagnosis not present

## 2017-03-21 DIAGNOSIS — E063 Autoimmune thyroiditis: Secondary | ICD-10-CM | POA: Diagnosis not present

## 2017-03-21 DIAGNOSIS — Z9119 Patient's noncompliance with other medical treatment and regimen: Secondary | ICD-10-CM | POA: Diagnosis not present

## 2017-03-22 DIAGNOSIS — Z9119 Patient's noncompliance with other medical treatment and regimen: Secondary | ICD-10-CM | POA: Diagnosis not present

## 2017-03-22 DIAGNOSIS — E1010 Type 1 diabetes mellitus with ketoacidosis without coma: Secondary | ICD-10-CM | POA: Diagnosis not present

## 2017-03-22 DIAGNOSIS — E878 Other disorders of electrolyte and fluid balance, not elsewhere classified: Secondary | ICD-10-CM | POA: Diagnosis not present

## 2017-03-22 DIAGNOSIS — R740 Nonspecific elevation of levels of transaminase and lactic acid dehydrogenase [LDH]: Secondary | ICD-10-CM | POA: Diagnosis not present

## 2017-03-22 DIAGNOSIS — R0602 Shortness of breath: Secondary | ICD-10-CM | POA: Diagnosis not present

## 2017-08-24 DIAGNOSIS — E063 Autoimmune thyroiditis: Secondary | ICD-10-CM | POA: Diagnosis not present

## 2017-08-24 DIAGNOSIS — F17200 Nicotine dependence, unspecified, uncomplicated: Secondary | ICD-10-CM | POA: Diagnosis not present

## 2017-08-24 DIAGNOSIS — E039 Hypothyroidism, unspecified: Secondary | ICD-10-CM | POA: Diagnosis not present

## 2017-08-24 DIAGNOSIS — Z794 Long term (current) use of insulin: Secondary | ICD-10-CM | POA: Diagnosis not present

## 2017-08-24 DIAGNOSIS — F909 Attention-deficit hyperactivity disorder, unspecified type: Secondary | ICD-10-CM | POA: Diagnosis not present

## 2017-08-24 DIAGNOSIS — R739 Hyperglycemia, unspecified: Secondary | ICD-10-CM | POA: Diagnosis not present

## 2017-08-24 DIAGNOSIS — E1065 Type 1 diabetes mellitus with hyperglycemia: Secondary | ICD-10-CM | POA: Diagnosis not present

## 2017-08-25 DIAGNOSIS — Z794 Long term (current) use of insulin: Secondary | ICD-10-CM | POA: Diagnosis not present

## 2017-08-25 DIAGNOSIS — E039 Hypothyroidism, unspecified: Secondary | ICD-10-CM | POA: Diagnosis not present

## 2017-08-25 DIAGNOSIS — E1065 Type 1 diabetes mellitus with hyperglycemia: Secondary | ICD-10-CM | POA: Diagnosis not present

## 2017-12-22 DIAGNOSIS — R05 Cough: Secondary | ICD-10-CM | POA: Diagnosis not present

## 2018-02-10 DIAGNOSIS — E038 Other specified hypothyroidism: Secondary | ICD-10-CM | POA: Diagnosis not present

## 2018-02-10 DIAGNOSIS — E1065 Type 1 diabetes mellitus with hyperglycemia: Secondary | ICD-10-CM | POA: Diagnosis not present

## 2018-02-10 DIAGNOSIS — E230 Hypopituitarism: Secondary | ICD-10-CM | POA: Diagnosis not present

## 2018-02-10 DIAGNOSIS — E063 Autoimmune thyroiditis: Secondary | ICD-10-CM | POA: Diagnosis not present

## 2018-03-01 DIAGNOSIS — E1065 Type 1 diabetes mellitus with hyperglycemia: Secondary | ICD-10-CM | POA: Diagnosis not present

## 2018-06-13 DIAGNOSIS — E1065 Type 1 diabetes mellitus with hyperglycemia: Secondary | ICD-10-CM | POA: Diagnosis not present

## 2018-06-13 DIAGNOSIS — J069 Acute upper respiratory infection, unspecified: Secondary | ICD-10-CM | POA: Diagnosis not present

## 2018-06-13 DIAGNOSIS — R079 Chest pain, unspecified: Secondary | ICD-10-CM | POA: Diagnosis not present

## 2018-06-13 DIAGNOSIS — B9789 Other viral agents as the cause of diseases classified elsewhere: Secondary | ICD-10-CM | POA: Diagnosis not present

## 2018-06-13 DIAGNOSIS — J869 Pyothorax without fistula: Secondary | ICD-10-CM | POA: Diagnosis not present

## 2018-06-13 DIAGNOSIS — F17210 Nicotine dependence, cigarettes, uncomplicated: Secondary | ICD-10-CM | POA: Diagnosis not present

## 2018-06-13 DIAGNOSIS — R042 Hemoptysis: Secondary | ICD-10-CM | POA: Diagnosis not present

## 2018-06-13 DIAGNOSIS — R05 Cough: Secondary | ICD-10-CM | POA: Diagnosis not present

## 2018-07-24 DIAGNOSIS — R531 Weakness: Secondary | ICD-10-CM | POA: Diagnosis not present

## 2018-07-24 DIAGNOSIS — R42 Dizziness and giddiness: Secondary | ICD-10-CM | POA: Diagnosis not present

## 2018-07-24 DIAGNOSIS — E162 Hypoglycemia, unspecified: Secondary | ICD-10-CM | POA: Diagnosis not present

## 2018-08-22 DIAGNOSIS — E1065 Type 1 diabetes mellitus with hyperglycemia: Secondary | ICD-10-CM | POA: Diagnosis not present

## 2018-08-22 DIAGNOSIS — J988 Other specified respiratory disorders: Secondary | ICD-10-CM | POA: Diagnosis not present

## 2018-08-22 DIAGNOSIS — E871 Hypo-osmolality and hyponatremia: Secondary | ICD-10-CM | POA: Diagnosis not present

## 2018-08-22 DIAGNOSIS — R112 Nausea with vomiting, unspecified: Secondary | ICD-10-CM | POA: Diagnosis not present

## 2018-08-22 DIAGNOSIS — Z720 Tobacco use: Secondary | ICD-10-CM | POA: Diagnosis not present

## 2018-08-22 DIAGNOSIS — Z8639 Personal history of other endocrine, nutritional and metabolic disease: Secondary | ICD-10-CM | POA: Diagnosis not present

## 2018-08-22 DIAGNOSIS — E063 Autoimmune thyroiditis: Secondary | ICD-10-CM | POA: Diagnosis not present

## 2018-08-22 DIAGNOSIS — E1010 Type 1 diabetes mellitus with ketoacidosis without coma: Secondary | ICD-10-CM | POA: Diagnosis not present

## 2018-08-22 DIAGNOSIS — R05 Cough: Secondary | ICD-10-CM | POA: Diagnosis not present

## 2018-08-22 DIAGNOSIS — Z794 Long term (current) use of insulin: Secondary | ICD-10-CM | POA: Diagnosis not present

## 2018-08-22 DIAGNOSIS — E876 Hypokalemia: Secondary | ICD-10-CM | POA: Diagnosis not present

## 2018-08-22 DIAGNOSIS — Z9119 Patient's noncompliance with other medical treatment and regimen: Secondary | ICD-10-CM | POA: Diagnosis not present

## 2018-08-22 DIAGNOSIS — R0902 Hypoxemia: Secondary | ICD-10-CM | POA: Diagnosis not present

## 2018-08-22 DIAGNOSIS — F17290 Nicotine dependence, other tobacco product, uncomplicated: Secondary | ICD-10-CM | POA: Diagnosis not present

## 2018-08-23 DIAGNOSIS — Z720 Tobacco use: Secondary | ICD-10-CM | POA: Diagnosis not present

## 2018-08-23 DIAGNOSIS — Z8639 Personal history of other endocrine, nutritional and metabolic disease: Secondary | ICD-10-CM | POA: Diagnosis not present

## 2018-08-23 DIAGNOSIS — R0902 Hypoxemia: Secondary | ICD-10-CM | POA: Diagnosis not present

## 2018-08-23 DIAGNOSIS — Z794 Long term (current) use of insulin: Secondary | ICD-10-CM | POA: Diagnosis not present

## 2018-08-23 DIAGNOSIS — E1010 Type 1 diabetes mellitus with ketoacidosis without coma: Secondary | ICD-10-CM | POA: Diagnosis not present

## 2018-09-13 DIAGNOSIS — E038 Other specified hypothyroidism: Secondary | ICD-10-CM | POA: Diagnosis not present

## 2018-09-13 DIAGNOSIS — E1065 Type 1 diabetes mellitus with hyperglycemia: Secondary | ICD-10-CM | POA: Diagnosis not present

## 2018-09-13 DIAGNOSIS — E1040 Type 1 diabetes mellitus with diabetic neuropathy, unspecified: Secondary | ICD-10-CM | POA: Diagnosis not present

## 2018-09-13 DIAGNOSIS — E063 Autoimmune thyroiditis: Secondary | ICD-10-CM | POA: Diagnosis not present

## 2018-10-04 DIAGNOSIS — E1065 Type 1 diabetes mellitus with hyperglycemia: Secondary | ICD-10-CM | POA: Diagnosis not present

## 2018-10-19 DIAGNOSIS — E039 Hypothyroidism, unspecified: Secondary | ICD-10-CM | POA: Diagnosis not present

## 2018-10-19 DIAGNOSIS — F322 Major depressive disorder, single episode, severe without psychotic features: Secondary | ICD-10-CM | POA: Diagnosis not present

## 2018-10-19 DIAGNOSIS — E1042 Type 1 diabetes mellitus with diabetic polyneuropathy: Secondary | ICD-10-CM | POA: Diagnosis not present

## 2019-05-17 DIAGNOSIS — E1165 Type 2 diabetes mellitus with hyperglycemia: Secondary | ICD-10-CM | POA: Diagnosis not present

## 2019-05-17 DIAGNOSIS — R52 Pain, unspecified: Secondary | ICD-10-CM | POA: Diagnosis not present

## 2019-05-17 DIAGNOSIS — M5489 Other dorsalgia: Secondary | ICD-10-CM | POA: Diagnosis not present

## 2019-06-05 DIAGNOSIS — F909 Attention-deficit hyperactivity disorder, unspecified type: Secondary | ICD-10-CM | POA: Diagnosis not present

## 2019-06-05 DIAGNOSIS — Z20828 Contact with and (suspected) exposure to other viral communicable diseases: Secondary | ICD-10-CM | POA: Diagnosis not present

## 2019-06-05 DIAGNOSIS — Z79899 Other long term (current) drug therapy: Secondary | ICD-10-CM | POA: Diagnosis not present

## 2019-06-05 DIAGNOSIS — Z886 Allergy status to analgesic agent status: Secondary | ICD-10-CM | POA: Diagnosis not present

## 2019-06-05 DIAGNOSIS — Z794 Long term (current) use of insulin: Secondary | ICD-10-CM | POA: Diagnosis not present

## 2019-06-05 DIAGNOSIS — B349 Viral infection, unspecified: Secondary | ICD-10-CM | POA: Diagnosis not present

## 2019-06-05 DIAGNOSIS — E119 Type 2 diabetes mellitus without complications: Secondary | ICD-10-CM | POA: Diagnosis not present

## 2019-06-05 DIAGNOSIS — F17290 Nicotine dependence, other tobacco product, uncomplicated: Secondary | ICD-10-CM | POA: Diagnosis not present

## 2019-06-05 DIAGNOSIS — E039 Hypothyroidism, unspecified: Secondary | ICD-10-CM | POA: Diagnosis not present

## 2019-08-13 DIAGNOSIS — Z20828 Contact with and (suspected) exposure to other viral communicable diseases: Secondary | ICD-10-CM | POA: Diagnosis not present

## 2019-08-13 DIAGNOSIS — E1040 Type 1 diabetes mellitus with diabetic neuropathy, unspecified: Secondary | ICD-10-CM | POA: Diagnosis not present

## 2019-08-13 DIAGNOSIS — Z794 Long term (current) use of insulin: Secondary | ICD-10-CM | POA: Diagnosis not present

## 2019-08-13 DIAGNOSIS — E039 Hypothyroidism, unspecified: Secondary | ICD-10-CM | POA: Diagnosis not present

## 2019-08-13 DIAGNOSIS — E063 Autoimmune thyroiditis: Secondary | ICD-10-CM | POA: Diagnosis not present

## 2019-08-13 DIAGNOSIS — Z9114 Patient's other noncompliance with medication regimen: Secondary | ICD-10-CM | POA: Diagnosis not present

## 2019-08-13 DIAGNOSIS — Z8249 Family history of ischemic heart disease and other diseases of the circulatory system: Secondary | ICD-10-CM | POA: Diagnosis not present

## 2019-08-13 DIAGNOSIS — Z886 Allergy status to analgesic agent status: Secondary | ICD-10-CM | POA: Diagnosis not present

## 2019-08-13 DIAGNOSIS — Z7722 Contact with and (suspected) exposure to environmental tobacco smoke (acute) (chronic): Secondary | ICD-10-CM | POA: Diagnosis not present

## 2019-08-13 DIAGNOSIS — Z833 Family history of diabetes mellitus: Secondary | ICD-10-CM | POA: Diagnosis not present

## 2019-08-13 DIAGNOSIS — E1010 Type 1 diabetes mellitus with ketoacidosis without coma: Secondary | ICD-10-CM | POA: Diagnosis not present

## 2019-08-13 DIAGNOSIS — I10 Essential (primary) hypertension: Secondary | ICD-10-CM | POA: Diagnosis not present

## 2019-08-17 DIAGNOSIS — E1040 Type 1 diabetes mellitus with diabetic neuropathy, unspecified: Secondary | ICD-10-CM | POA: Diagnosis not present

## 2019-08-17 DIAGNOSIS — E1065 Type 1 diabetes mellitus with hyperglycemia: Secondary | ICD-10-CM | POA: Diagnosis not present

## 2019-08-17 DIAGNOSIS — E063 Autoimmune thyroiditis: Secondary | ICD-10-CM | POA: Diagnosis not present

## 2019-08-17 DIAGNOSIS — E1010 Type 1 diabetes mellitus with ketoacidosis without coma: Secondary | ICD-10-CM | POA: Diagnosis not present

## 2019-08-17 DIAGNOSIS — E039 Hypothyroidism, unspecified: Secondary | ICD-10-CM | POA: Diagnosis not present

## 2019-08-17 DIAGNOSIS — Z794 Long term (current) use of insulin: Secondary | ICD-10-CM | POA: Diagnosis not present

## 2019-08-17 DIAGNOSIS — F329 Major depressive disorder, single episode, unspecified: Secondary | ICD-10-CM | POA: Diagnosis not present

## 2020-05-23 ENCOUNTER — Other Ambulatory Visit: Payer: Self-pay

## 2020-05-23 ENCOUNTER — Emergency Department (INDEPENDENT_AMBULATORY_CARE_PROVIDER_SITE_OTHER)
Admission: EM | Admit: 2020-05-23 | Discharge: 2020-05-23 | Disposition: A | Discharge status: Home / Self Care | Payer: BC Managed Care – PPO | Source: Home / Self Care

## 2020-05-23 DIAGNOSIS — R112 Nausea with vomiting, unspecified: Secondary | ICD-10-CM

## 2020-05-23 DIAGNOSIS — R1084 Generalized abdominal pain: Secondary | ICD-10-CM | POA: Diagnosis not present

## 2020-05-23 DIAGNOSIS — Z79899 Other long term (current) drug therapy: Secondary | ICD-10-CM | POA: Diagnosis not present

## 2020-05-23 DIAGNOSIS — R739 Hyperglycemia, unspecified: Secondary | ICD-10-CM

## 2020-05-23 DIAGNOSIS — Z888 Allergy status to other drugs, medicaments and biological substances status: Secondary | ICD-10-CM | POA: Diagnosis not present

## 2020-05-23 DIAGNOSIS — K920 Hematemesis: Secondary | ICD-10-CM | POA: Diagnosis not present

## 2020-05-23 DIAGNOSIS — K529 Noninfective gastroenteritis and colitis, unspecified: Secondary | ICD-10-CM | POA: Diagnosis not present

## 2020-05-23 DIAGNOSIS — F17290 Nicotine dependence, other tobacco product, uncomplicated: Secondary | ICD-10-CM | POA: Diagnosis not present

## 2020-05-23 DIAGNOSIS — F909 Attention-deficit hyperactivity disorder, unspecified type: Secondary | ICD-10-CM | POA: Diagnosis not present

## 2020-05-23 DIAGNOSIS — Z20822 Contact with and (suspected) exposure to covid-19: Secondary | ICD-10-CM | POA: Diagnosis not present

## 2020-05-23 DIAGNOSIS — R197 Diarrhea, unspecified: Secondary | ICD-10-CM | POA: Diagnosis not present

## 2020-05-23 DIAGNOSIS — R109 Unspecified abdominal pain: Secondary | ICD-10-CM | POA: Diagnosis not present

## 2020-05-23 DIAGNOSIS — Z794 Long term (current) use of insulin: Secondary | ICD-10-CM | POA: Diagnosis not present

## 2020-05-23 DIAGNOSIS — E119 Type 2 diabetes mellitus without complications: Secondary | ICD-10-CM | POA: Diagnosis not present

## 2020-05-23 LAB — POCT FASTING CBG KUC MANUAL ENTRY: POCT Glucose (KUC): 369 mg/dL — AB (ref 70–99)

## 2020-05-23 MED ORDER — ONDANSETRON 4 MG PO TBDP
4.0000 mg | ORAL_TABLET | Freq: Once | ORAL | Status: AC
  Administered 2020-05-23: 4 mg via ORAL

## 2020-05-23 NOTE — ED Provider Notes (Signed)
[facility]    CSN: [id number] Arrival date & time: 05/23/20  1301      History   Chief Complaint Chief Complaint  Patient presents with  . Abdominal Pain    HPI ERMAN THUM is a 24 y.o. male.   HPI  DAMIAN BUCKLES is a 24 y.o. male presenting to UC with c/o 3 days worsening n/v/d, SOB, fatigue, chills and generalized abdominal pain. He had an episode of vomiting yesterday with a small amount of red blood but also reports several episodes of blood diarrhea, including this morning. Pt has Type 1 DM, hx of DKA. States his wife came back from a trip recently with GI symptoms, pt suspected she gave him a stomach virus but his symptoms keep worsening.  States his glucose keeps spiking. It was 343 this morning, he gave himself twelve units of insulin.  Per medical records, pt has been noncompliant with his insulin regimen and was hospitalized in December 2020 for DKA.   Past Medical History:  Diagnosis Date  . Diabetes mellitus without complication (HCC)    INSULIN DEPENDENT   SINCE AGE 2  . DKA (diabetic ketoacidoses) (HCC) 02/17/2017  . Hashimoto's disease   . MRSA infection   . Pneumonia    Liver contusion, lung effusion, sepsis multiorgan failure, no clear source    Patient Active Problem List   Diagnosis Date Noted  . DKA, type 1 (HCC) 02/17/2017  . Nausea & vomiting 02/17/2017  . DKA (diabetic ketoacidoses) (HCC) 12/16/2016  . Cellulitis 12/16/2016  . AKI (acute kidney injury) (HCC) 12/16/2016  . DKA, type 1, not at goal [facility] 12/16/2016  . Hypothyroidism (acquired) 11/01/2014  . Type 1 diabetes mellitus with hyperglycemia (HCC) 08/15/2001    Past Surgical History:  Procedure Laterality Date  . CHEST TUBE INSERTION         Home Medications    Prior to Admission medications   Medication Sig Start Date End Date Taking? Authorizing Provider  insulin glargine (LANTUS) 100 UNIT/ML injection Inject 0.35 mLs (35 Units total) into the skin at  bedtime. 02/18/17   Ghimire, Werner Lean, MD  insulin lispro (HUMALOG) 100 UNIT/ML injection Inject 0.01-0.15 mLs (1-15 Units total) into the skin 3 (three) times daily before meals. Sliding scale 02/18/17   Ghimire, Werner Lean, MD  levothyroxine (SYNTHROID, LEVOTHROID) 150 MCG tablet Take 150 mcg by mouth daily. 05/15/16   [provider]  ondansetron (ZOFRAN ODT) 4 MG disintegrating tablet Take 1 tablet (4 mg total) by mouth every 8 (eight) hours as needed for nausea or vomiting. 12/05/16   Elpidio Anis, PA-C  pantoprazole (PROTONIX) 40 MG tablet Take 1 tablet (40 mg total) by mouth daily. 02/18/17   Ghimire, Werner Lean, MD    Family History Family History  Problem Relation Age of Onset  . Diabetes Cousin   . Heart disease Other   . Cancer Other   . Thyroid disease Other   . Healthy Mother     Social History Social History   Tobacco Use  . Smoking status: Former Smoker    Packs/day: 0.50    Years: 4.00    Pack years: 2.00    Types: Cigarettes    Quit date: 05/23/2017    Years since quitting: 3.0  . Smokeless tobacco: Never Used  Vaping Use  . Vaping Use: Every day  . Substances: Nicotine, Flavoring  Substance Use Topics  . Alcohol use: Yes    Comment: occ  . Drug use:  No     Allergies   Ibuprofen   Review of Systems Review of Systems  Constitutional: Positive for chills and fatigue. Negative for fever.  HENT: Positive for congestion. Negative for ear pain, sore throat, trouble swallowing and voice change.   Respiratory: Positive for cough (mild). Negative for shortness of breath.   Cardiovascular: Negative for chest pain and palpitations.  Gastrointestinal: Positive for abdominal pain, blood in stool, diarrhea, nausea and vomiting.  Musculoskeletal: Negative for arthralgias, back pain and myalgias.  Skin: Negative for rash.  All other systems reviewed and are negative.    Physical Exam Triage Vital Signs ED Triage Vitals  Enc Vitals Group     BP 05/23/20  1319 124/83     Pulse Rate 05/23/20 1319 96     Resp 05/23/20 1319 20     Temp 05/23/20 1319 98.9 F (37.2 C)     Temp Source 05/23/20 1319 Oral     SpO2 05/23/20 1319 99 %     Weight --      Height --      Head Circumference --      Peak Flow --      Pain Score 05/23/20 1316 6     Pain Loc --      Pain Edu? --      Excl. in GC? --    No data found.  Updated Vital Signs BP 124/83 (BP Location: Left Arm)   Pulse 96   Temp 98.9 F (37.2 C) (Oral)   Resp 20   SpO2 99%   Visual Acuity Right Eye Distance:   Left Eye Distance:   Bilateral Distance:    Right Eye Near:   Left Eye Near:    Bilateral Near:     Physical Exam Vitals and nursing note reviewed.  Constitutional:      General: He is not in acute distress.    Appearance: He is well-developed. He is not ill-appearing, toxic-appearing or diaphoretic.  HENT:     Head: Normocephalic and atraumatic.     Nose: Nose normal.  Cardiovascular:     Rate and Rhythm: Normal rate and regular rhythm.  Pulmonary:     Effort: Pulmonary effort is normal. No respiratory distress.     Breath sounds: Normal breath sounds. No stridor. No wheezing, rhonchi or rales.  Abdominal:     General: There is no distension.     Palpations: Abdomen is soft.     Tenderness: There is generalized abdominal tenderness. There is no right CVA tenderness, left CVA tenderness, guarding or rebound. Negative signs include Murphy's sign and McBurney's sign.  Musculoskeletal:        General: Normal range of motion.     Cervical back: Normal range of motion.  Skin:    General: Skin is warm and dry.  Neurological:     General: No focal deficit present.     Mental Status: He is alert and oriented to person, place, and time.  Psychiatric:        Behavior: Behavior normal.      UC Treatments / Results  Labs (all labs ordered are listed, but only abnormal results are displayed) Labs Reviewed  POCT FASTING CBG [facility] MANUAL ENTRY - Abnormal; Notable  for the following components:      Result Value   POCT Glucose ([facility]) 369 (*)    All other components within normal limits  NOVEL CORONAVIRUS, NAA    EKG   Radiology No results found.  Procedures Procedures (including critical care time)  Medications Ordered in UC Medications  ondansetron (ZOFRAN-ODT) disintegrating tablet 4 mg (4 mg Oral Given 05/23/20 1330)    Initial Impression / Assessment and Plan / UC Course  I have reviewed the triage vital signs and the nursing notes.  Pertinent labs & imaging results that were available during my care of the patient were reviewed by me and considered in my medical decision making (see chart for details).    Pt appears well, NAD but his CBG is 369 despite taking 12 units of insulin this morning for his sugar of 343. Pt also has diffuse abdominal tenderness and reports of blood in vomit and diarrhea. Recommend further evaluation in emergency department where stat labs and imaging available Pt understanding and agreeable to go to hospital, declined EMS transport AVS given  Final Clinical Impressions(s) / UC Diagnoses   Final diagnoses:  Nausea and vomiting, intractability of vomiting not specified, unspecified vomiting type  Generalized abdominal pain  Bloody diarrhea  Symptom of blood in vomit  Hyperglycemia     Discharge Instructions      You have declined EMS transport but it is still recommended you go to the emergency department immediately where stat labs and imaging are available for further evaluation and treatment of your symptoms.     ED Prescriptions    None     PDMP not reviewed this encounter.   Lurene Shadow, PA-C 05/23/20 1357

## 2020-05-23 NOTE — ED Triage Notes (Signed)
Patient presents to Urgent Care with complaints of blood in his stool and blood in vomit, sob, fatigue, chills since 3 days ago. Patient reports his wife came home from visiting w/ a friend and had similar sx, which she then gave to the pt. Pt has not been vaccinated for covid. Is a type 1 diabetic, states his glucose keeps spiking.

## 2020-05-23 NOTE — ED Notes (Signed)
Patient is being discharged from the Urgent Care and sent to the Emergency Department via POV . Per Denny Peon, [place], patient is in need of higher level of care due to need for evaluation for possible DKA and bloody emesis and diarrhea. Patient is aware and verbalizes understanding of plan of care. PCR COVID test completed during today's visit. Vitals:   05/23/20 1319  BP: 124/83  Pulse: 96  Resp: 20  Temp: 98.9 F (37.2 C)  SpO2: 99%

## 2020-05-23 NOTE — Discharge Instructions (Addendum)
  You have declined EMS transport but it is still recommended you go to the emergency department immediately where stat labs and imaging are available for further evaluation and treatment of your symptoms.

## 2020-05-25 LAB — SARS-COV-2, NAA 2 DAY TAT

## 2020-05-25 LAB — NOVEL CORONAVIRUS, NAA: SARS-CoV-2, NAA: NOT DETECTED

## 2020-06-06 ENCOUNTER — Ambulatory Visit (HOSPITAL_COMMUNITY)
Admission: EM | Admit: 2020-06-06 | Discharge: 2020-06-06 | Disposition: A | Discharge status: Home / Self Care | Payer: BC Managed Care – PPO | Attending: Emergency Medicine | Admitting: Emergency Medicine

## 2020-06-06 ENCOUNTER — Other Ambulatory Visit: Payer: Self-pay

## 2020-06-06 ENCOUNTER — Encounter (HOSPITAL_COMMUNITY): Payer: Self-pay

## 2020-06-06 DIAGNOSIS — A4902 Methicillin resistant Staphylococcus aureus infection, unspecified site: Secondary | ICD-10-CM

## 2020-06-06 DIAGNOSIS — L03119 Cellulitis of unspecified part of limb: Secondary | ICD-10-CM | POA: Diagnosis not present

## 2020-06-06 MED ORDER — LIDOCAINE HCL (PF) 1 % IJ SOLN
INTRAMUSCULAR | Status: AC
  Filled 2020-06-06: qty 2

## 2020-06-06 MED ORDER — DOXYCYCLINE HYCLATE 100 MG PO CAPS: 100.0000 mg | ORAL_CAPSULE | Freq: Two times a day (BID) | ORAL | 0 refills | Status: DC

## 2020-06-06 MED ORDER — HIBICLENS 4 % EX LIQD: Freq: Every day | CUTANEOUS | 0 refills | Status: DC | PRN

## 2020-06-06 MED ORDER — CEFTRIAXONE SODIUM 1 G IJ SOLR
1.0000 g | Freq: Once | INTRAMUSCULAR | Status: AC
  Administered 2020-06-06: 1 g via INTRAMUSCULAR

## 2020-06-06 MED ORDER — CEFTRIAXONE SODIUM 1 G IJ SOLR
INTRAMUSCULAR | Status: AC
  Filled 2020-06-06: qty 10

## 2020-06-06 NOTE — ED Provider Notes (Signed)
HPI  SUBJECTIVE:  David Ramsey is a 24 y.o. male who presents with recurrent skin infection/open sores starting 1 week ago.  He states it started off as a bubble in the back of his right leg, he scratched it because it was itching.  He reports burning pain, increasing erythema and now has a "hole in the middle" in this area.  He reports similar lesions appearing over the past week primarily along his right lower extremity, but also on the dorsum of his left foot.  Reports body aches, purulent drainage.  No fevers.  States that his glucose has been running in the 200s, which is better than his normal baseline.  States that his glucose is normally higher.  He tried hydrogen peroxide, Neosporin, wrapping the areas in gauze, alcohol swabs without improvement of symptoms.  No aggravating factors.  Has a past medical history of diabetes type 1, diabetic neuropathy, MRSA, DKA, Hashimoto's thyroiditis he states this is identical to previous MRSA infections.  PMD: None.    Past Medical History:  Diagnosis Date  . Diabetes mellitus without complication (HCC)    INSULIN DEPENDENT   SINCE AGE 42  . DKA (diabetic ketoacidoses) (HCC) 02/17/2017  . Hashimoto's disease   . MRSA infection   . Pneumonia    Liver contusion, lung effusion, sepsis multiorgan failure, no clear source    Past Surgical History:  Procedure Laterality Date  . CHEST TUBE INSERTION      Family History  Problem Relation Age of Onset  . Diabetes Cousin   . Heart disease Other   . Cancer Other   . Thyroid disease Other   . Healthy Mother     Social History   Tobacco Use  . Smoking status: Former Smoker    Packs/day: 0.50    Years: 4.00    Pack years: 2.00    Types: Cigarettes    Quit date: 05/23/2017    Years since quitting: 3.0  . Smokeless tobacco: Never Used  Vaping Use  . Vaping Use: Every day  . Substances: Nicotine, Flavoring  Substance Use Topics  . Alcohol use: Yes    Comment: occ  . Drug use: No      Current Facility-Administered Medications:  .  cefTRIAXone (ROCEPHIN) injection 1 g, 1 g, Intramuscular, Once, Domenick Gong, MD  Current Outpatient Medications:  .  chlorhexidine (HIBICLENS) 4 % external liquid, Apply topically daily as needed. Dilute 10-15 mL in water, Use daily when bathing for 1-2 weeks, Disp: 120 mL, Rfl: 0 .  doxycycline (VIBRAMYCIN) 100 MG capsule, Take 1 capsule (100 mg total) by mouth 2 (two) times daily for 7 days., Disp: 14 capsule, Rfl: 0 .  insulin glargine (LANTUS) 100 UNIT/ML injection, Inject 0.35 mLs (35 Units total) into the skin at bedtime., Disp: 10 mL, Rfl: 0 .  insulin lispro (HUMALOG) 100 UNIT/ML injection, Inject 0.01-0.15 mLs (1-15 Units total) into the skin 3 (three) times daily before meals. Sliding scale, Disp: 10 mL, Rfl: 0 .  levothyroxine (SYNTHROID, LEVOTHROID) 150 MCG tablet, Take 150 mcg by mouth daily., Disp: , Rfl:   Allergies  Allergen Reactions  . Ibuprofen Other (See Comments)    WANT TO AVOID LIVER FAILURE, SO PATIENT WAS TOLD NOT TO TAKE     ROS  As noted in HPI.   Physical Exam  BP 129/87   Pulse 80   Temp 98.2 F (36.8 C)   Resp 18   SpO2 97%   Constitutional: Well developed, well  nourished, no acute distress Eyes:  EOMI, conjunctiva normal bilaterally HENT: Normocephalic, atraumatic,mucus membranes moist Respiratory: Normal inspiratory effort Cardiovascular: Normal rate GI: nondistended Skin: Multiple tender areas of erythema with central lesions bilateral lower extremities.  No expressible purulent drainage.  No induration.  Marked areas of erythema with a marker for reference.           Musculoskeletal: no deformities Neurologic: Alert & oriented x 3, no focal neuro deficits Psychiatric: Speech and behavior appropriate   ED Course   Medications  cefTRIAXone (ROCEPHIN) injection 1 g (has no administration in time range)    No orders of the defined types were placed in this  encounter.   No results found for this or any previous visit (from the past 24 hour(s)). No results found.  ED Clinical Impression  1. MRSA infection   2. Cellulitis of lower extremity, unspecified laterality      ED Assessment/Plan  Attempted to drain one of the lesions after cleaning extensively with alcohol, no pus expressed.  Thus no culture done.  Placed bacitracin and Band-Aid.  will give a gram of Rocephin here due to the extent of the infections and send home with 7 days of doxycycline, Hibiclens.  Suspect MRSA infection.  Vitals are otherwise normal.  No evidence of sepsis.  Patient states that he does not feel like he is going into DKA.  Will provide primary care list.  May return here if not getting better, to the ER if he gets worse.  Discussed  MDM, treatment plan, and plan for follow-up with patient. Discussed sn/sx that should prompt return to the ED. patient agrees with plan.   Meds ordered this encounter  Medications  . cefTRIAXone (ROCEPHIN) injection 1 g  . doxycycline (VIBRAMYCIN) 100 MG capsule    Sig: Take 1 capsule (100 mg total) by mouth 2 (two) times daily for 7 days.    Dispense:  14 capsule    Refill:  0  . chlorhexidine (HIBICLENS) 4 % external liquid    Sig: Apply topically daily as needed. Dilute 10-15 mL in water, Use daily when bathing for 1-2 weeks    Dispense:  120 mL    Refill:  0    *This clinic note was created using Scientist, clinical (histocompatibility and immunogenetics). Therefore, there may be occasional mistakes despite careful proofreading.   ?    Domenick Gong, MD 06/07/20 [phone]

## 2020-06-06 NOTE — Discharge Instructions (Signed)
Stop using hydrogen peroxide.  Soap and water, or Hibiclens and water.  Hibiclens is a surgical soap.  Finish the doxycycline, even if you feel better.  Try to keep your sugar under as tight control as possible.  Below is a list of primary care practices who are taking new patients for you to follow-up with.  [facility] internal medicine clinic Ground Floor - [facility], [address] [place], [place], [place] [address] [phone]  [facility] at [facility] [address] [place], [place] [address] [phone]  [facility] [address] [place], [place] [address] [phone]  [facility] [phone] [address] [place] [address]  [facility]: [address] [place] [address]  [phone]  [facility]: [address] [place] [address]   [phone]  [facility]: [address] [place] [address]  [phone]  [facility] : [address] [place] [address] [phone]  [facility] Primary Care: [address] [place] [address] [phone]  [facility]: [address] [place] [address] [phone]  Dr. Oneal Grout [address] [facility] [place] [address]  [phone]  Dr. Jackie Plum, [facility]. [address] [place], [place] [address]  [phone]  Go to www.goodrx.com to look up your medications. This will give you a list of where you can find your prescriptions at the most affordable prices. Or ask the pharmacist what the cash price is, or if they have any other discount programs available to help make your medication more affordable. This can be less expensive than what you would  pay with insurance.

## 2020-06-06 NOTE — ED Triage Notes (Signed)
Pt presents with complaints of rash to his lower extremeties. Area is painful, with open wounds, drainage, and burning. Patient has history of MRSA and concerned for same. Reports they appeared without injury and he noticed them 6 days ago. The area on the right leg is spreading up his thigh. Pt is a diabetic, denies elevated blood sugars at home.

## 2020-06-08 ENCOUNTER — Telehealth (HOSPITAL_COMMUNITY): Payer: Self-pay | Admitting: Emergency Medicine

## 2020-06-08 MED ORDER — DOXYCYCLINE HYCLATE 100 MG PO CAPS: 100.0000 mg | ORAL_CAPSULE | Freq: Two times a day (BID) | ORAL | 0 refills | Status: AC

## 2020-06-08 MED ORDER — HIBICLENS 4 % EX LIQD
Freq: Every day | CUTANEOUS | 0 refills | Status: DC | PRN
Start: 2020-06-08 — End: 2020-11-03

## 2020-06-24 DIAGNOSIS — L98499 Non-pressure chronic ulcer of skin of other sites with unspecified severity: Secondary | ICD-10-CM | POA: Diagnosis not present

## 2020-06-24 DIAGNOSIS — E063 Autoimmune thyroiditis: Secondary | ICD-10-CM | POA: Diagnosis not present

## 2020-06-24 DIAGNOSIS — Z560 Unemployment, unspecified: Secondary | ICD-10-CM | POA: Diagnosis not present

## 2020-06-24 DIAGNOSIS — E1040 Type 1 diabetes mellitus with diabetic neuropathy, unspecified: Secondary | ICD-10-CM | POA: Diagnosis not present

## 2020-06-24 DIAGNOSIS — B9562 Methicillin resistant Staphylococcus aureus infection as the cause of diseases classified elsewhere: Secondary | ICD-10-CM | POA: Diagnosis not present

## 2020-06-24 DIAGNOSIS — F17290 Nicotine dependence, other tobacco product, uncomplicated: Secondary | ICD-10-CM | POA: Diagnosis not present

## 2020-06-24 DIAGNOSIS — R059 Cough, unspecified: Secondary | ICD-10-CM | POA: Diagnosis not present

## 2020-06-24 DIAGNOSIS — R058 Other specified cough: Secondary | ICD-10-CM | POA: Diagnosis not present

## 2020-06-24 DIAGNOSIS — Z79899 Other long term (current) drug therapy: Secondary | ICD-10-CM | POA: Diagnosis not present

## 2020-06-24 DIAGNOSIS — I10 Essential (primary) hypertension: Secondary | ICD-10-CM | POA: Diagnosis not present

## 2020-06-24 DIAGNOSIS — E860 Dehydration: Secondary | ICD-10-CM | POA: Diagnosis not present

## 2020-06-24 DIAGNOSIS — N179 Acute kidney failure, unspecified: Secondary | ICD-10-CM | POA: Diagnosis not present

## 2020-06-24 DIAGNOSIS — F909 Attention-deficit hyperactivity disorder, unspecified type: Secondary | ICD-10-CM | POA: Diagnosis not present

## 2020-06-24 DIAGNOSIS — Z833 Family history of diabetes mellitus: Secondary | ICD-10-CM | POA: Diagnosis not present

## 2020-06-24 DIAGNOSIS — Z8249 Family history of ischemic heart disease and other diseases of the circulatory system: Secondary | ICD-10-CM | POA: Diagnosis not present

## 2020-06-24 DIAGNOSIS — Z794 Long term (current) use of insulin: Secondary | ICD-10-CM | POA: Diagnosis not present

## 2020-06-24 DIAGNOSIS — R112 Nausea with vomiting, unspecified: Secondary | ICD-10-CM | POA: Diagnosis not present

## 2020-06-24 DIAGNOSIS — E1010 Type 1 diabetes mellitus with ketoacidosis without coma: Secondary | ICD-10-CM | POA: Diagnosis not present

## 2020-06-24 DIAGNOSIS — Z20822 Contact with and (suspected) exposure to covid-19: Secondary | ICD-10-CM | POA: Diagnosis not present

## 2020-06-24 DIAGNOSIS — E039 Hypothyroidism, unspecified: Secondary | ICD-10-CM | POA: Diagnosis not present

## 2020-09-10 DIAGNOSIS — Z20822 Contact with and (suspected) exposure to covid-19: Secondary | ICD-10-CM | POA: Diagnosis not present

## 2020-09-10 DIAGNOSIS — R5383 Other fatigue: Secondary | ICD-10-CM | POA: Diagnosis not present

## 2020-09-10 DIAGNOSIS — R110 Nausea: Secondary | ICD-10-CM | POA: Diagnosis not present

## 2020-09-10 DIAGNOSIS — J984 Other disorders of lung: Secondary | ICD-10-CM | POA: Diagnosis not present

## 2020-09-10 DIAGNOSIS — R079 Chest pain, unspecified: Secondary | ICD-10-CM | POA: Diagnosis not present

## 2020-09-10 DIAGNOSIS — R439 Unspecified disturbances of smell and taste: Secondary | ICD-10-CM | POA: Diagnosis not present

## 2020-09-10 DIAGNOSIS — R918 Other nonspecific abnormal finding of lung field: Secondary | ICD-10-CM | POA: Diagnosis not present

## 2020-10-28 ENCOUNTER — Inpatient Hospital Stay (HOSPITAL_COMMUNITY)
Admission: EM | Admit: 2020-10-28 | Discharge: 2020-11-03 | DRG: 637 | Disposition: A | Discharge status: Home / Self Care | Payer: BC Managed Care – PPO | Attending: Internal Medicine | Admitting: Internal Medicine

## 2020-10-28 ENCOUNTER — Emergency Department (HOSPITAL_COMMUNITY): Payer: BC Managed Care – PPO

## 2020-10-28 ENCOUNTER — Other Ambulatory Visit: Payer: Self-pay

## 2020-10-28 ENCOUNTER — Encounter (HOSPITAL_COMMUNITY): Payer: Self-pay

## 2020-10-28 DIAGNOSIS — E063 Autoimmune thyroiditis: Secondary | ICD-10-CM | POA: Diagnosis not present

## 2020-10-28 DIAGNOSIS — R21 Rash and other nonspecific skin eruption: Secondary | ICD-10-CM | POA: Diagnosis not present

## 2020-10-28 DIAGNOSIS — Z888 Allergy status to other drugs, medicaments and biological substances status: Secondary | ICD-10-CM

## 2020-10-28 DIAGNOSIS — R0902 Hypoxemia: Secondary | ICD-10-CM | POA: Diagnosis not present

## 2020-10-28 DIAGNOSIS — R945 Abnormal results of liver function studies: Secondary | ICD-10-CM | POA: Diagnosis not present

## 2020-10-28 DIAGNOSIS — T380X5A Adverse effect of glucocorticoids and synthetic analogues, initial encounter: Secondary | ICD-10-CM | POA: Diagnosis not present

## 2020-10-28 DIAGNOSIS — R233 Spontaneous ecchymoses: Secondary | ICD-10-CM | POA: Diagnosis not present

## 2020-10-28 DIAGNOSIS — Z79890 Hormone replacement therapy (postmenopausal): Secondary | ICD-10-CM

## 2020-10-28 DIAGNOSIS — D75839 Thrombocytosis, unspecified: Secondary | ICD-10-CM | POA: Diagnosis not present

## 2020-10-28 DIAGNOSIS — D72829 Elevated white blood cell count, unspecified: Secondary | ICD-10-CM | POA: Diagnosis not present

## 2020-10-28 DIAGNOSIS — Z20822 Contact with and (suspected) exposure to covid-19: Secondary | ICD-10-CM | POA: Diagnosis present

## 2020-10-28 DIAGNOSIS — Z6827 Body mass index (BMI) 27.0-27.9, adult: Secondary | ICD-10-CM | POA: Diagnosis not present

## 2020-10-28 DIAGNOSIS — Z79899 Other long term (current) drug therapy: Secondary | ICD-10-CM

## 2020-10-28 DIAGNOSIS — R609 Edema, unspecified: Secondary | ICD-10-CM | POA: Diagnosis not present

## 2020-10-28 DIAGNOSIS — E663 Overweight: Secondary | ICD-10-CM | POA: Diagnosis not present

## 2020-10-28 DIAGNOSIS — Z833 Family history of diabetes mellitus: Secondary | ICD-10-CM

## 2020-10-28 DIAGNOSIS — J9601 Acute respiratory failure with hypoxia: Secondary | ICD-10-CM | POA: Diagnosis not present

## 2020-10-28 DIAGNOSIS — M545 Low back pain, unspecified: Secondary | ICD-10-CM | POA: Diagnosis present

## 2020-10-28 DIAGNOSIS — E876 Hypokalemia: Secondary | ICD-10-CM | POA: Diagnosis not present

## 2020-10-28 DIAGNOSIS — E1010 Type 1 diabetes mellitus with ketoacidosis without coma: Principal | ICD-10-CM | POA: Diagnosis present

## 2020-10-28 DIAGNOSIS — T383X6A Underdosing of insulin and oral hypoglycemic [antidiabetic] drugs, initial encounter: Secondary | ICD-10-CM | POA: Diagnosis not present

## 2020-10-28 DIAGNOSIS — Z794 Long term (current) use of insulin: Secondary | ICD-10-CM | POA: Diagnosis not present

## 2020-10-28 DIAGNOSIS — D649 Anemia, unspecified: Secondary | ICD-10-CM | POA: Diagnosis present

## 2020-10-28 DIAGNOSIS — R000 Tachycardia, unspecified: Secondary | ICD-10-CM | POA: Diagnosis not present

## 2020-10-28 DIAGNOSIS — E039 Hypothyroidism, unspecified: Secondary | ICD-10-CM | POA: Diagnosis not present

## 2020-10-28 DIAGNOSIS — R17 Unspecified jaundice: Secondary | ICD-10-CM | POA: Diagnosis present

## 2020-10-28 DIAGNOSIS — Z8614 Personal history of Methicillin resistant Staphylococcus aureus infection: Secondary | ICD-10-CM

## 2020-10-28 DIAGNOSIS — R112 Nausea with vomiting, unspecified: Secondary | ICD-10-CM | POA: Diagnosis not present

## 2020-10-28 DIAGNOSIS — E1065 Type 1 diabetes mellitus with hyperglycemia: Secondary | ICD-10-CM | POA: Diagnosis present

## 2020-10-28 DIAGNOSIS — J9811 Atelectasis: Secondary | ICD-10-CM | POA: Diagnosis not present

## 2020-10-28 DIAGNOSIS — R7989 Other specified abnormal findings of blood chemistry: Secondary | ICD-10-CM | POA: Diagnosis not present

## 2020-10-28 DIAGNOSIS — Z9114 Patient's other noncompliance with medication regimen: Secondary | ICD-10-CM | POA: Diagnosis not present

## 2020-10-28 DIAGNOSIS — L309 Dermatitis, unspecified: Secondary | ICD-10-CM | POA: Diagnosis not present

## 2020-10-28 DIAGNOSIS — R0600 Dyspnea, unspecified: Secondary | ICD-10-CM | POA: Diagnosis not present

## 2020-10-28 DIAGNOSIS — Z87891 Personal history of nicotine dependence: Secondary | ICD-10-CM

## 2020-10-28 DIAGNOSIS — J189 Pneumonia, unspecified organism: Secondary | ICD-10-CM | POA: Diagnosis not present

## 2020-10-28 DIAGNOSIS — L308 Other specified dermatitis: Secondary | ICD-10-CM | POA: Diagnosis not present

## 2020-10-28 DIAGNOSIS — R531 Weakness: Secondary | ICD-10-CM | POA: Diagnosis not present

## 2020-10-28 DIAGNOSIS — E1165 Type 2 diabetes mellitus with hyperglycemia: Secondary | ICD-10-CM | POA: Diagnosis not present

## 2020-10-28 DIAGNOSIS — R0602 Shortness of breath: Secondary | ICD-10-CM

## 2020-10-28 LAB — URINALYSIS, ROUTINE W REFLEX MICROSCOPIC
Bilirubin Urine: NEGATIVE
Glucose, UA: >=500 mg/dL — AB
Ketones, ur: 80 mg/dL — AB
Leukocytes,Ua: NEGATIVE
Nitrite: NEGATIVE
Protein, ur: NEGATIVE mg/dL
Specific Gravity, Urine: 1.012 (ref 1.005–1.030)
pH: 5 (ref 5.0–8.0)

## 2020-10-28 LAB — BASIC METABOLIC PANEL
Anion gap: 28 — ABNORMAL HIGH (ref 5–15)
BUN: 18 mg/dL (ref 6–20)
CO2: 7 mmol/L — ABNORMAL LOW (ref 22–32)
Calcium: 9.2 mg/dL (ref 8.9–10.3)
Chloride: 97 mmol/L — ABNORMAL LOW (ref 98–111)
Creatinine, Ser: 1.49 mg/dL — ABNORMAL HIGH (ref 0.61–1.24)
GFR, Estimated: >60 mL/min (ref >60)
Glucose, Bld: 426 mg/dL — ABNORMAL HIGH (ref 70–99)
Potassium: 4.3 mmol/L (ref 3.5–5.1)
Sodium: 132 mmol/L — ABNORMAL LOW (ref 135–145)

## 2020-10-28 LAB — CBC
HCT: 44.7 % (ref 39.0–52.0)
Hemoglobin: 15 g/dL (ref 13.0–17.0)
MCH: 30.6 pg (ref 26.0–34.0)
MCHC: 33.6 g/dL (ref 30.0–36.0)
MCV: 91.2 fL (ref 80.0–100.0)
Platelets: 534 10*3/uL — ABNORMAL HIGH (ref 150–400)
RBC: 4.9 MIL/uL (ref 4.22–5.81)
RDW: 12.3 % (ref 11.5–15.5)
WBC: 23 10*3/uL — ABNORMAL HIGH (ref 4.0–10.5)
nRBC: 0 % (ref 0.0–0.2)

## 2020-10-28 LAB — CBG MONITORING, ED
Glucose-Capillary: 313 mg/dL — ABNORMAL HIGH (ref 70–99)
Glucose-Capillary: 343 mg/dL — ABNORMAL HIGH (ref 70–99)
Glucose-Capillary: 382 mg/dL — ABNORMAL HIGH (ref 70–99)
Glucose-Capillary: 440 mg/dL — ABNORMAL HIGH (ref 70–99)
Glucose-Capillary: 458 mg/dL — ABNORMAL HIGH (ref 70–99)

## 2020-10-28 LAB — BLOOD GAS, VENOUS
Acid-base deficit: 24.2 mmol/L — ABNORMAL HIGH (ref 0.0–2.0)
Bicarbonate: 5.2 mmol/L — ABNORMAL LOW (ref 20.0–28.0)
O2 Saturation: 78.8 %
Patient temperature: 98.6
pCO2, Ven: 17.2 mmHg — CL (ref 44.0–60.0)
pH, Ven: 7.11 — CL (ref 7.250–7.430)
pO2, Ven: 50.5 mmHg — ABNORMAL HIGH (ref 32.0–45.0)

## 2020-10-28 LAB — BETA-HYDROXYBUTYRIC ACID: Beta-Hydroxybutyric Acid: >8 mmol/L — ABNORMAL HIGH (ref 0.05–0.27)

## 2020-10-28 MED ORDER — POTASSIUM CHLORIDE 10 MEQ/100ML IV SOLN
10.0000 meq | INTRAVENOUS | Status: AC
  Administered 2020-10-28 (×2): 10 meq via INTRAVENOUS
  Filled 2020-10-28 (×2): qty 100

## 2020-10-28 MED ORDER — DEXTROSE 50 % IV SOLN: 0.0000 mL | INTRAVENOUS | Status: DC | PRN

## 2020-10-28 MED ORDER — ONDANSETRON HCL 4 MG/2ML IJ SOLN
4.0000 mg | Freq: Four times a day (QID) | INTRAMUSCULAR | Status: DC | PRN
  Administered 2020-10-30 (×2): 4 mg via INTRAVENOUS
  Filled 2020-10-28 (×2): qty 2

## 2020-10-28 MED ORDER — METRONIDAZOLE IN NACL 5-0.79 MG/ML-% IV SOLN
500.0000 mg | Freq: Three times a day (TID) | INTRAVENOUS | Status: DC
  Administered 2020-10-29 (×2): 500 mg via INTRAVENOUS
  Filled 2020-10-28 (×2): qty 100

## 2020-10-28 MED ORDER — LACTATED RINGERS IV BOLUS
1000.0000 mL | INTRAVENOUS | Status: AC
  Administered 2020-10-28: 1000 mL via INTRAVENOUS

## 2020-10-28 MED ORDER — LACTATED RINGERS IV SOLN: INTRAVENOUS | Status: DC

## 2020-10-28 MED ORDER — ACETAMINOPHEN 650 MG RE SUPP: 650.0000 mg | Freq: Four times a day (QID) | RECTAL | Status: DC | PRN

## 2020-10-28 MED ORDER — STERILE WATER FOR INJECTION IV SOLN
INTRAVENOUS | Status: DC
  Filled 2020-10-28 (×2): qty 150
  Filled 2020-10-28: qty 850

## 2020-10-28 MED ORDER — INSULIN REGULAR(HUMAN) IN NACL 100-0.9 UT/100ML-% IV SOLN
INTRAVENOUS | Status: DC
  Administered 2020-10-28: 8.5 [IU]/h via INTRAVENOUS
  Administered 2020-10-29: 6.5 [IU]/h via INTRAVENOUS
  Filled 2020-10-28 (×2): qty 100

## 2020-10-28 MED ORDER — ONDANSETRON HCL 4 MG/2ML IJ SOLN
4.0000 mg | Freq: Once | INTRAMUSCULAR | Status: AC
  Administered 2020-10-28: 4 mg via INTRAVENOUS
  Filled 2020-10-28: qty 2

## 2020-10-28 MED ORDER — ACETAMINOPHEN 325 MG PO TABS
650.0000 mg | ORAL_TABLET | Freq: Four times a day (QID) | ORAL | Status: DC | PRN
  Administered 2020-10-29 – 2020-10-31 (×4): 650 mg via ORAL
  Filled 2020-10-28 (×4): qty 2

## 2020-10-28 MED ORDER — DIPHENHYDRAMINE HCL 50 MG/ML IJ SOLN
25.0000 mg | Freq: Three times a day (TID) | INTRAMUSCULAR | Status: DC | PRN
  Administered 2020-10-29 – 2020-10-30 (×5): 25 mg via INTRAVENOUS
  Filled 2020-10-28 (×5): qty 1

## 2020-10-28 MED ORDER — DEXTROSE IN LACTATED RINGERS 5 % IV SOLN: INTRAVENOUS | Status: DC

## 2020-10-28 NOTE — H&P (Signed)
History and Physical    David Ramsey IAX:[id number] DOB: April 12, 1996 DOA: 10/28/2020  PCP: Patient, No Pcp Per Patient coming from: Home  Chief Complaint: Hyperglycemia: Nausea, vomiting  HPI: David Ramsey is a 25 y.o. 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  Patient reported not taking his insulin for the past few months.  In the ED, afebrile.  Tachycardic and tachypneic.  Not hypotensive or hypoxic.  Labs showing WBC 23.0, hemoglobin 15.0, platelet count 534K. Sodium 132, potassium 4.3, chloride 97, bicarb 7, anion gap 28, BUN 18, creatinine 1.4, glucose 426.  UA with ketones.  Beta hydroxybutyric acid level pending.  VBG with pH 7.1, PCO2 17.  TSH pending.  Blood culture x2 pending.  Chest x-ray negative for acute cardiopulmonary abnormality.  PCCM consulted and felt that the patient did not need ICU admission.  Patient states he is having an "infection" all over his body and is itching.  Reports noticing skin lesions on his legs initially about 2 months ago which are now all over.  States he was seen at urgent care for this problem and prescribed antibiotics which did not help.  For the past 2 days he is having fevers as high as 103 F.  He has been vomiting since yesterday.  Denies abdominal pain or diarrhea.  States he is a type I diabetic and has been on insulin since the age of 18.  He was previously on Lantus and Humalog but has not been able to take them for the past 3 months due to not being able to afford them.  States he has not been able to make any appointments with his doctor and has not heard back from their office.  States he bought Novolin 70/30 from [facility] but is not using it regularly.  He is not vaccinated against COVID.  Denies headaches, cough, or shortness of breath.  Review of Systems:  All systems reviewed and apart  from history of presenting illness, are negative.  Past Medical History:  Diagnosis Date  . Diabetes mellitus without complication ([place])    INSULIN DEPENDENT   SINCE AGE 56  . DKA (diabetic ketoacidoses) 02/17/2017  . Hashimoto's disease   . MRSA infection   . Pneumonia    Liver contusion, lung effusion, sepsis multiorgan failure, no clear source    Past Surgical History:  Procedure Laterality Date  . CHEST TUBE INSERTION       reports that he quit smoking about 3 years ago. His smoking use included cigarettes. He has a 2.00 pack-year smoking history. He has never used smokeless tobacco. He reports current alcohol use. He reports that he does not use drugs.  Allergies  Allergen Reactions  . Ibuprofen Other (See Comments)    WANT TO AVOID LIVER FAILURE, SO PATIENT WAS TOLD NOT TO TAKE    Family History  Problem Relation Age of Onset  . Diabetes Cousin   . Heart disease Other   . Cancer Other   . Thyroid disease Other   . Healthy Mother     Prior to Admission medications   Medication Sig Start Date End Date Taking? Authorizing Provider  FLUoxetine (PROZAC) 20 MG capsule Take 20 mg by mouth daily. 07/23/20  Yes [provider]  gabapentin (NEURONTIN) 300 MG capsule Take 300 mg by mouth 3 (three) times daily. 06/07/20  Yes [provider]  insulin glargine (LANTUS) 100 UNIT/ML injection Inject 0.35 mLs (35 Units total) into the skin at bedtime. 02/18/17  Yes Ghimire, Henreitta Leber, MD  insulin lispro (HUMALOG) 100 UNIT/ML injection Inject 0.01-0.15 mLs (1-15 Units total) into the skin 3 (three) times daily before meals. Sliding scale 02/18/17  Yes Ghimire, Henreitta Leber, MD  levothyroxine (SYNTHROID, LEVOTHROID) 150 MCG tablet Take 150 mcg by mouth daily. 05/15/16  Yes [provider]  chlorhexidine (HIBICLENS) 4 % external liquid Apply topically daily as needed. Dilute 10-15 mL in water, Use daily when bathing for 1-2 weeks 06/08/20   Melynda Ripple, MD   pantoprazole (PROTONIX) 40 MG tablet Take 1 tablet (40 mg total) by mouth daily. 02/18/17 06/06/20  Jonetta Osgood, MD    Physical Exam: Vitals:   10/28/20 1855 10/28/20 2032 10/28/20 2145 10/28/20 2245  BP: 134/81  (!) 154/105 (!) 138/93  Pulse: 90  (!) 126 (!) 106  Resp: 18  (!) 35 19  Temp:      TempSrc:      SpO2: 98%  99% 99%  Weight:  70.3 kg    Height:  5' 6"  (1.676 m)      Physical Exam Constitutional:      General: He is not in acute distress. HENT:     Head: Normocephalic and atraumatic.     Mouth/Throat:     Mouth: Mucous membranes are dry.  Eyes:     Extraocular Movements: Extraocular movements intact.     Conjunctiva/sclera: Conjunctivae normal.  Cardiovascular:     Rate and Rhythm: Regular rhythm. Tachycardia present.     Pulses: Normal pulses.     Comments: Slightly tachycardic Pulmonary:     Effort: Pulmonary effort is normal. No respiratory distress.     Breath sounds: Normal breath sounds. No wheezing or rales.  Abdominal:     General: Bowel sounds are normal. There is no distension.     Palpations: Abdomen is soft.     Tenderness: There is no abdominal tenderness.  Musculoskeletal:        General: No swelling or tenderness.     Cervical back: Normal range of motion and neck supple. No rigidity.  Skin:    General: Skin is warm and dry.     Comments: Diffuse petechial rash and excoriated skin lesions on the trunk and extremities  Neurological:     General: No focal deficit present.     Mental Status: He is alert and oriented to person, place, and time.     Labs on Admission: I have personally reviewed following labs and imaging studies  CBC: Recent Labs  Lab 10/28/20 1716  WBC 23.0*  HGB 15.0  HCT 44.7  MCV 91.2  PLT 782*   Basic Metabolic Panel: Recent Labs  Lab 10/28/20 1716  NA 132*  K 4.3  CL 97*  CO2 7*  GLUCOSE 426*  BUN 18  CREATININE 1.49*  CALCIUM 9.2   GFR: Estimated Creatinine Clearance: 69.6 mL/min (A) (by C-G  formula based on SCr of 1.49 mg/dL (H)). Liver Function Tests: No results for input(s): AST, ALT, ALKPHOS, BILITOT, PROT, ALBUMIN in the last 168 hours. No results for input(s): LIPASE, AMYLASE in the last 168 hours. No results for input(s): AMMONIA in the last 168 hours. Coagulation Profile: No results for input(s): INR, PROTIME in the last 168 hours. Cardiac Enzymes: No results for input(s): CKTOTAL, CKMB, CKMBINDEX, TROPONINI in the last 168 hours. BNP (last 3 results) No results for input(s): PROBNP in  the last 8760 hours. HbA1C: No results for input(s): HGBA1C in the last 72 hours. CBG: Recent Labs  Lab 10/28/20 1749 10/28/20 2027 10/28/20 2133 10/28/20 2242  GLUCAP 458* 440* 382* 343*   Lipid Profile: No results for input(s): CHOL, HDL, LDLCALC, TRIG, CHOLHDL, LDLDIRECT in the last 72 hours. Thyroid Function Tests: No results for input(s): TSH, T4TOTAL, FREET4, T3FREE, THYROIDAB in the last 72 hours. Anemia Panel: No results for input(s): VITAMINB12, FOLATE, FERRITIN, TIBC, IRON, RETICCTPCT in the last 72 hours. Urine analysis:    Component Value Date/Time   COLORURINE STRAW (A) 10/28/2020 1827   APPEARANCEUR CLEAR 10/28/2020 1827   LABSPEC 1.012 10/28/2020 1827   PHURINE 5.0 10/28/2020 1827   GLUCOSEU >=500 (A) 10/28/2020 1827   HGBUR MODERATE (A) 10/28/2020 1827   BILIRUBINUR NEGATIVE 10/28/2020 1827   KETONESUR 80 (A) 10/28/2020 1827   PROTEINUR NEGATIVE 10/28/2020 1827   NITRITE NEGATIVE 10/28/2020 1827   LEUKOCYTESUR NEGATIVE 10/28/2020 1827    Radiological Exams on Admission: DG Chest Portable 1 View  Result Date: 10/28/2020 CLINICAL DATA:  Leukocytosis EXAM: PORTABLE CHEST 1 VIEW COMPARISON:  None. FINDINGS: Lungs are clear. Heart size and pulmonary vascularity are normal. No adenopathy. No bone lesions. IMPRESSION: Lungs clear.  Cardiac silhouette normal. Electronically Signed   By: Lowella Grip III M.D.   On: 10/28/2020 21:09     Assessment/Plan Principal Problem:   DKA, type 1 ([place]) Active Problems:   Hypothyroidism (acquired)   Nausea & vomiting   Leukocytosis   Rash   Severe DKA, type 1 diabetes: Likely precipitating factor is insulin nonadherence as patient reports not using insulin for the past few months.  Lab work concerning for severe DKA with blood glucose 426, bicarb 7, anion gap 28, UA with ketones, beta hydroxybutyric acid >8.0, and VBG with pH 7.1, PCO2 17. -Admit to stepdown unit.  Keep n.p.o. Continue insulin infusion and IV fluids per DKA protocol.  Frequent CBG checks per Endo tool.  Start bicarb infusion.  Monitor BMP every 4 hours.  Check serum osmolarity.  After resolution of metabolic acidosis and closure of anion gap, initiate diet and subcutaneous insulin with plan to continue IV insulin for an additional 1 to 2 hours to ensure adequate plasma insulin levels.  Diabetes coordinator consulted.  Diffuse petechial rash: Noted to have a diffuse petechial rash and excoriated skin lesions on the trunk and extremities.  Does have pruritus.  Not thrombocytopenic.  ?Allergic reaction.  However, does have significant leukocytosis on labs.  Meningitis is on the differential but no headaches or signs of meningeal irritation.  Patient reports having fevers at home but has been afebrile in the ED. -Benadryl as needed for itching.  Will hold off starting steroids in the setting of severe DKA.  Check ESR and CRP levels.  Started on broad-spectrum antibiotics given leukocytosis.  Leukocytosis: Labs showing significant leukocytosis.  He is afebrile.  Tachycardic and tachypneic in the setting of severe DKA. UA without signs of infection.  Chest x-ray not suggestive of pneumonia. -Empiric broad-spectrum antibiotics.  Follow-up blood cultures.  Continue to monitor WBC count.  Check procalcitonin level.  Nausea, emesis: Likely related to DKA.  No complaints of abdominal pain or diarrhea.  Abdominal exam  benign. -Check lipase and LFTs.  IV fluid hydration, antiemetic as needed.  Acquired hypothyroidism due to history of Hashimoto's disease: Synthroid listed in home medications but he is not taking it. -TSH ordered  DVT prophylaxis: SCDs Code Status: Full code Family Communication: No  family available at this time. Disposition Plan: Status is: Observation  The patient remains OBS appropriate and will d/c before 2 midnights.  Dispo: The patient is from: Home              Anticipated d/c is to: Home              Anticipated d/c date is: 2 days              Patient currently is not medically stable to d/c.   Difficult to place patient No  Consults called: PCCM (Dr. Elsworth Soho) Level of care: Level of care: Stepdown   The medical decision making on this patient was of high complexity and the patient is at high risk for clinical deterioration, therefore this is a level 3 visit.  Shela Leff MD [facility]  If 7PM-7AM, please contact night-coverage [email or web address]  10/28/2020, 11:52 PM

## 2020-10-28 NOTE — ED Provider Notes (Addendum)
[facility]-EMERGENCY DEPT Provider Note   CSN: [id number] Arrival date & time: 10/28/20  1659     History Chief Complaint  Patient presents with  . Hyperglycemia  . Emesis    David Ramsey is a 25 y.o. male.  HPI Patient is a type I diabetic.  Has been off insulin for a few months.  However has been feeling bad for the last few days.  States he is mostly sleeping.  Some nausea and vomiting.  States he has increasing sores over his body also.  States he has been having some chills.  Feels as if he is in DKA.  History of DKA.  States that the source of been on his body for a while but worsened recently.    Past Medical History:  Diagnosis Date  . Diabetes mellitus without complication (HCC)    INSULIN DEPENDENT   SINCE AGE 70  . DKA (diabetic ketoacidoses) 02/17/2017  . Hashimoto's disease   . MRSA infection   . Pneumonia    Liver contusion, lung effusion, sepsis multiorgan failure, no clear source    Patient Active Problem List   Diagnosis Date Noted  . DKA, type 1 (HCC) 02/17/2017  . Nausea & vomiting 02/17/2017  . DKA (diabetic ketoacidoses) 12/16/2016  . Cellulitis 12/16/2016  . AKI (acute kidney injury) (HCC) 12/16/2016  . DKA, type 1, not at goal [facility]) 12/16/2016  . Hypothyroidism (acquired) 11/01/2014  . Type 1 diabetes mellitus with hyperglycemia (HCC) 08/15/2001    Past Surgical History:  Procedure Laterality Date  . CHEST TUBE INSERTION         Family History  Problem Relation Age of Onset  . Diabetes Cousin   . Heart disease Other   . Cancer Other   . Thyroid disease Other   . Healthy Mother     Social History   Tobacco Use  . Smoking status: Former Smoker    Packs/day: 0.50    Years: 4.00    Pack years: 2.00    Types: Cigarettes    Quit date: 05/23/2017    Years since quitting: 3.4  . Smokeless tobacco: Never Used  Vaping Use  . Vaping Use: Every day  . Substances: Nicotine, Flavoring  Substance Use Topics  .  Alcohol use: Yes    Comment: occ  . Drug use: No    Home Medications Prior to Admission medications   Medication Sig Start Date End Date Taking? Authorizing Provider  chlorhexidine (HIBICLENS) 4 % external liquid Apply topically daily as needed. Dilute 10-15 mL in water, Use daily when bathing for 1-2 weeks 06/08/20   Domenick Gong, MD  insulin glargine (LANTUS) 100 UNIT/ML injection Inject 0.35 mLs (35 Units total) into the skin at bedtime. 02/18/17   Ghimire, Werner Lean, MD  insulin lispro (HUMALOG) 100 UNIT/ML injection Inject 0.01-0.15 mLs (1-15 Units total) into the skin 3 (three) times daily before meals. Sliding scale 02/18/17   Ghimire, Werner Lean, MD  levothyroxine (SYNTHROID, LEVOTHROID) 150 MCG tablet Take 150 mcg by mouth daily. 05/15/16   [provider]  pantoprazole (PROTONIX) 40 MG tablet Take 1 tablet (40 mg total) by mouth daily. 02/18/17 06/06/20  GhimireWerner Lean, MD    Allergies    Ibuprofen  Review of Systems   Review of Systems  Constitutional: Positive for appetite change and chills.  HENT: Negative for congestion.   Respiratory: Negative for shortness of breath.   Cardiovascular: Negative for chest pain.  Gastrointestinal: Negative for  abdominal pain.  Endocrine: Positive for polyuria.  Genitourinary: Negative for flank pain.  Musculoskeletal: Negative for back pain.  Skin: Positive for wound.  Neurological: Positive for weakness.  Psychiatric/Behavioral: Positive for confusion.    Physical Exam Updated Vital Signs BP 134/81   Pulse 90   Temp 97.8 F (36.6 C) (Oral)   Resp 18   Ht 5\' 6"  (1.676 m)   Wt 70.3 kg   SpO2 98%   BMI 25.02 kg/m   Physical Exam Vitals reviewed.  Constitutional:      Appearance: Normal appearance.  HENT:     Head: Atraumatic.     Mouth/Throat:     Mouth: Mucous membranes are dry.  Eyes:     Pupils: Pupils are equal, round, and reactive to light.  Cardiovascular:     Rate and Rhythm: Regular rhythm.   Pulmonary:     Breath sounds: No wheezing or rhonchi.  Abdominal:     Tenderness: There is no abdominal tenderness.  Musculoskeletal:        General: No tenderness.  Skin:    General: Skin is warm.     Capillary Refill: Capillary refill takes less than 2 seconds.     Comments: Patient with multiple areas of potential cellulitis.  Somewhat round areas around a centimeter across.  Also potentially underlying petechial nonblanching rash.  Neurological:     Mental Status: He is alert and oriented to person, place, and time.     ED Results / Procedures / Treatments   Labs (all labs ordered are listed, but only abnormal results are displayed) Labs Reviewed  BASIC METABOLIC PANEL - Abnormal; Notable for the following components:      Result Value   Sodium 132 (*)    Chloride 97 (*)    CO2 7 (*)    Glucose, Bld 426 (*)    Creatinine, Ser 1.49 (*)    Anion gap 28 (*)    All other components within normal limits  CBC - Abnormal; Notable for the following components:   WBC 23.0 (*)    Platelets 534 (*)    All other components within normal limits  URINALYSIS, ROUTINE W REFLEX MICROSCOPIC - Abnormal; Notable for the following components:   Color, Urine STRAW (*)    Glucose, UA >=500 (*)    Hgb urine dipstick MODERATE (*)    Ketones, ur 80 (*)    Bacteria, UA RARE (*)    All other components within normal limits  BLOOD GAS, VENOUS - Abnormal; Notable for the following components:   pH, Ven 7.110 (*)    pCO2, Ven 17.2 (*)    pO2, Ven 50.5 (*)    Bicarbonate 5.2 (*)    Acid-base deficit 24.2 (*)    All other components within normal limits  CBG MONITORING, ED - Abnormal; Notable for the following components:   Glucose-Capillary 458 (*)    All other components within normal limits  CBG MONITORING, ED - Abnormal; Notable for the following components:   Glucose-Capillary 440 (*)    All other components within normal limits  CBG MONITORING, ED - Abnormal; Notable for the following  components:   Glucose-Capillary 382 (*)    All other components within normal limits  CULTURE, BLOOD (ROUTINE X 2)  CULTURE, BLOOD (ROUTINE X 2)  BETA-HYDROXYBUTYRIC ACID  BETA-HYDROXYBUTYRIC ACID  TSH    EKG None  Radiology DG Chest Portable 1 View  Result Date: 10/28/2020 CLINICAL DATA:  Leukocytosis EXAM: PORTABLE CHEST 1 VIEW  COMPARISON:  None. FINDINGS: Lungs are clear. Heart size and pulmonary vascularity are normal. No adenopathy. No bone lesions. IMPRESSION: Lungs clear.  Cardiac silhouette normal. Electronically Signed   By: Bretta Bang III M.D.   On: 10/28/2020 21:09    Procedures Procedures   Medications Ordered in ED Medications  insulin regular, human (MYXREDLIN) 100 units/ 100 mL infusion (8.5 Units/hr Intravenous New Bag/Given 10/28/20 2033)  lactated ringers infusion ( Intravenous New Bag/Given 10/28/20 2031)  dextrose 5 % in lactated ringers infusion (0 mLs Intravenous Hold 10/28/20 1956)  dextrose 50 % solution 0-50 mL (has no administration in time range)  potassium chloride 10 mEq in 100 mL IVPB (10 mEq Intravenous New Bag/Given 10/28/20 2031)  lactated ringers bolus 1,000 mL (1,000 mLs Intravenous New Bag/Given (Non-Interop) 10/28/20 2030)  ondansetron (ZOFRAN) injection 4 mg (4 mg Intravenous Given 10/28/20 2025)    ED Course  I have reviewed the triage vital signs and the nursing notes.  Pertinent labs & imaging results that were available during my care of the patient were reviewed by me and considered in my medical decision making (see chart for details).    MDM Rules/Calculators/A&P                          Patient presents with DKA.  History of same.  Has been noncompliant with his medications.  Overall vitals reassuring however.  Afebrile.  However does have an elevated white count which could be secondary to DKA but also has somewhat diffuse rash.  Both petechial and potentially infectious component.  Blood culture sent.  Does not appear septic  at this time.  2 L fluid bolus given and started on insulin drip.  pH of 7.1.  Anion gap of 28.  Bicarb of 7.  Will admit to hospitalist  CRITICAL CARE Performed by: Benjiman Core Total critical care time: 30 minutes Critical care time was exclusive of separately billable procedures and treating other patients. Critical care was necessary to treat or prevent imminent or life-threatening deterioration. Critical care was time spent personally by me on the following activities: development of treatment plan with patient and/or surrogate as well as nursing, discussions with consultants, evaluation of patient's response to treatment, examination of patient, obtaining history from patient or surrogate, ordering and performing treatments and interventions, ordering and review of laboratory studies, ordering and review of radiographic studies, pulse oximetry and re-evaluation of patient's condition.  Discussed with Dr. Loney Loh.  States she feels the patient needs admission to the ICU and she does not admit to the ICU and thinks that he needs intensivist level care.  I will discuss with intensivist.  Final Clinical Impression(s) / ED Diagnoses Final diagnoses:  Diabetic ketoacidosis without coma associated with type 1 diabetes mellitus [facility])    Rx / DC Orders ED Discharge Orders    None       Benjiman Core, MD 10/28/20 2115    Benjiman Core, MD 10/28/20 2135

## 2020-10-28 NOTE — ED Notes (Signed)
Critical Lab Value:  VBG pH 7.110 VBG pCO2 17.2  Dr. Rubin Payor made aware.

## 2020-10-28 NOTE — ED Triage Notes (Signed)
Pt BIB EMS from home. Pt c/o hyperglycemia, N/V. The N/V for last few days. Pt stated he has not taken any insulin for past few months. Pt stated he was taking 70/30. Pt has diabetic sores.   138/92 114 HR 97% RA 436 CBG  20G L hand  500 mL NS given by EMS

## 2020-10-29 DIAGNOSIS — E1010 Type 1 diabetes mellitus with ketoacidosis without coma: Secondary | ICD-10-CM | POA: Diagnosis present

## 2020-10-29 DIAGNOSIS — E876 Hypokalemia: Secondary | ICD-10-CM | POA: Diagnosis not present

## 2020-10-29 DIAGNOSIS — Z79899 Other long term (current) drug therapy: Secondary | ICD-10-CM | POA: Diagnosis not present

## 2020-10-29 DIAGNOSIS — R233 Spontaneous ecchymoses: Secondary | ICD-10-CM | POA: Diagnosis present

## 2020-10-29 DIAGNOSIS — J9601 Acute respiratory failure with hypoxia: Secondary | ICD-10-CM | POA: Diagnosis not present

## 2020-10-29 DIAGNOSIS — Z8614 Personal history of Methicillin resistant Staphylococcus aureus infection: Secondary | ICD-10-CM | POA: Diagnosis not present

## 2020-10-29 DIAGNOSIS — J189 Pneumonia, unspecified organism: Secondary | ICD-10-CM | POA: Diagnosis not present

## 2020-10-29 DIAGNOSIS — Z888 Allergy status to other drugs, medicaments and biological substances status: Secondary | ICD-10-CM | POA: Diagnosis not present

## 2020-10-29 DIAGNOSIS — D649 Anemia, unspecified: Secondary | ICD-10-CM | POA: Diagnosis present

## 2020-10-29 DIAGNOSIS — D75839 Thrombocytosis, unspecified: Secondary | ICD-10-CM | POA: Diagnosis not present

## 2020-10-29 DIAGNOSIS — T383X6A Underdosing of insulin and oral hypoglycemic [antidiabetic] drugs, initial encounter: Secondary | ICD-10-CM | POA: Diagnosis present

## 2020-10-29 DIAGNOSIS — D72829 Elevated white blood cell count, unspecified: Secondary | ICD-10-CM | POA: Diagnosis not present

## 2020-10-29 DIAGNOSIS — M545 Low back pain, unspecified: Secondary | ICD-10-CM | POA: Diagnosis present

## 2020-10-29 DIAGNOSIS — Z20822 Contact with and (suspected) exposure to covid-19: Secondary | ICD-10-CM | POA: Diagnosis present

## 2020-10-29 DIAGNOSIS — R17 Unspecified jaundice: Secondary | ICD-10-CM | POA: Diagnosis present

## 2020-10-29 DIAGNOSIS — R112 Nausea with vomiting, unspecified: Secondary | ICD-10-CM | POA: Diagnosis not present

## 2020-10-29 DIAGNOSIS — R609 Edema, unspecified: Secondary | ICD-10-CM | POA: Diagnosis not present

## 2020-10-29 DIAGNOSIS — Z6827 Body mass index (BMI) 27.0-27.9, adult: Secondary | ICD-10-CM | POA: Diagnosis not present

## 2020-10-29 DIAGNOSIS — Z794 Long term (current) use of insulin: Secondary | ICD-10-CM | POA: Diagnosis not present

## 2020-10-29 DIAGNOSIS — E063 Autoimmune thyroiditis: Secondary | ICD-10-CM | POA: Diagnosis present

## 2020-10-29 DIAGNOSIS — E663 Overweight: Secondary | ICD-10-CM | POA: Diagnosis present

## 2020-10-29 DIAGNOSIS — Z87891 Personal history of nicotine dependence: Secondary | ICD-10-CM | POA: Diagnosis not present

## 2020-10-29 DIAGNOSIS — Z9114 Patient's other noncompliance with medication regimen: Secondary | ICD-10-CM | POA: Diagnosis not present

## 2020-10-29 DIAGNOSIS — R945 Abnormal results of liver function studies: Secondary | ICD-10-CM | POA: Diagnosis not present

## 2020-10-29 DIAGNOSIS — R21 Rash and other nonspecific skin eruption: Secondary | ICD-10-CM | POA: Diagnosis not present

## 2020-10-29 DIAGNOSIS — Z833 Family history of diabetes mellitus: Secondary | ICD-10-CM | POA: Diagnosis not present

## 2020-10-29 DIAGNOSIS — Z79890 Hormone replacement therapy (postmenopausal): Secondary | ICD-10-CM | POA: Diagnosis not present

## 2020-10-29 DIAGNOSIS — T380X5A Adverse effect of glucocorticoids and synthetic analogues, initial encounter: Secondary | ICD-10-CM | POA: Diagnosis not present

## 2020-10-29 DIAGNOSIS — E039 Hypothyroidism, unspecified: Secondary | ICD-10-CM | POA: Diagnosis not present

## 2020-10-29 LAB — BASIC METABOLIC PANEL
Anion gap: 19 — ABNORMAL HIGH (ref 5–15)
Anion gap: 14 (ref 5–15)
Anion gap: 12 (ref 5–15)
Anion gap: 8 (ref 5–15)
BUN: 17 mg/dL (ref 6–20)
BUN: 15 mg/dL (ref 6–20)
BUN: 15 mg/dL (ref 6–20)
BUN: 15 mg/dL (ref 6–20)
CO2: 11 mmol/L — ABNORMAL LOW (ref 22–32)
CO2: 24 mmol/L (ref 22–32)
CO2: 20 mmol/L — ABNORMAL LOW (ref 22–32)
CO2: 24 mmol/L (ref 22–32)
Calcium: 9 mg/dL (ref 8.9–10.3)
Calcium: 7.8 mg/dL — ABNORMAL LOW (ref 8.9–10.3)
Calcium: 8.6 mg/dL — ABNORMAL LOW (ref 8.9–10.3)
Calcium: 8.5 mg/dL — ABNORMAL LOW (ref 8.9–10.3)
Chloride: 102 mmol/L (ref 98–111)
Chloride: 94 mmol/L — ABNORMAL LOW (ref 98–111)
Chloride: 101 mmol/L (ref 98–111)
Chloride: 103 mmol/L (ref 98–111)
Creatinine, Ser: 1.53 mg/dL — ABNORMAL HIGH (ref 0.61–1.24)
Creatinine, Ser: 1.04 mg/dL (ref 0.61–1.24)
Creatinine, Ser: 1.09 mg/dL (ref 0.61–1.24)
Creatinine, Ser: 1.06 mg/dL (ref 0.61–1.24)
GFR, Estimated: >60 mL/min (ref >60)
GFR, Estimated: >60 mL/min (ref >60)
GFR, Estimated: >60 mL/min (ref >60)
GFR, Estimated: >60 mL/min (ref >60)
Glucose, Bld: 298 mg/dL — ABNORMAL HIGH (ref 70–99)
Glucose, Bld: 603 mg/dL (ref 70–99)
Glucose, Bld: 242 mg/dL — ABNORMAL HIGH (ref 70–99)
Glucose, Bld: 216 mg/dL — ABNORMAL HIGH (ref 70–99)
Potassium: 5 mmol/L (ref 3.5–5.1)
Potassium: 3.1 mmol/L — ABNORMAL LOW (ref 3.5–5.1)
Potassium: 3.2 mmol/L — ABNORMAL LOW (ref 3.5–5.1)
Potassium: 3.3 mmol/L — ABNORMAL LOW (ref 3.5–5.1)
Sodium: 132 mmol/L — ABNORMAL LOW (ref 135–145)
Sodium: 132 mmol/L — ABNORMAL LOW (ref 135–145)
Sodium: 133 mmol/L — ABNORMAL LOW (ref 135–145)
Sodium: 135 mmol/L (ref 135–145)

## 2020-10-29 LAB — SARS CORONAVIRUS 2 (TAT 6-24 HRS): SARS Coronavirus 2: NEGATIVE

## 2020-10-29 LAB — HEPATIC FUNCTION PANEL
ALT: 15 U/L (ref 0–44)
AST: 34 U/L (ref 15–41)
Albumin: 3.6 g/dL (ref 3.5–5.0)
Alkaline Phosphatase: 112 U/L (ref 38–126)
Bilirubin, Direct: 0.5 mg/dL — ABNORMAL HIGH (ref 0.0–0.2)
Indirect Bilirubin: 2.4 mg/dL — ABNORMAL HIGH (ref 0.3–0.9)
Total Bilirubin: 2.9 mg/dL — ABNORMAL HIGH (ref 0.3–1.2)
Total Protein: 7.3 g/dL (ref 6.5–8.1)

## 2020-10-29 LAB — CBC
HCT: 32.6 % — ABNORMAL LOW (ref 39.0–52.0)
Hemoglobin: 11.4 g/dL — ABNORMAL LOW (ref 13.0–17.0)
MCH: 30.6 pg (ref 26.0–34.0)
MCHC: 35 g/dL (ref 30.0–36.0)
MCV: 87.6 fL (ref 80.0–100.0)
Platelets: 400 10*3/uL (ref 150–400)
RBC: 3.72 MIL/uL — ABNORMAL LOW (ref 4.22–5.81)
RDW: 12 % (ref 11.5–15.5)
WBC: 13.9 10*3/uL — ABNORMAL HIGH (ref 4.0–10.5)
nRBC: 0 % (ref 0.0–0.2)

## 2020-10-29 LAB — GLUCOSE, CAPILLARY
Glucose-Capillary: 250 mg/dL — ABNORMAL HIGH (ref 70–99)
Glucose-Capillary: 266 mg/dL — ABNORMAL HIGH (ref 70–99)
Glucose-Capillary: 254 mg/dL — ABNORMAL HIGH (ref 70–99)
Glucose-Capillary: 241 mg/dL — ABNORMAL HIGH (ref 70–99)
Glucose-Capillary: 236 mg/dL — ABNORMAL HIGH (ref 70–99)
Glucose-Capillary: 198 mg/dL — ABNORMAL HIGH (ref 70–99)
Glucose-Capillary: 169 mg/dL — ABNORMAL HIGH (ref 70–99)
Glucose-Capillary: 151 mg/dL — ABNORMAL HIGH (ref 70–99)
Glucose-Capillary: 144 mg/dL — ABNORMAL HIGH (ref 70–99)
Glucose-Capillary: 160 mg/dL — ABNORMAL HIGH (ref 70–99)
Glucose-Capillary: 155 mg/dL — ABNORMAL HIGH (ref 70–99)
Glucose-Capillary: 294 mg/dL — ABNORMAL HIGH (ref 70–99)
Glucose-Capillary: 276 mg/dL — ABNORMAL HIGH (ref 70–99)

## 2020-10-29 LAB — HIV ANTIBODY (ROUTINE TESTING W REFLEX): HIV Screen 4th Generation wRfx: NONREACTIVE

## 2020-10-29 LAB — PROCALCITONIN: Procalcitonin: 0.57 ng/mL

## 2020-10-29 LAB — LACTATE DEHYDROGENASE: LDH: 154 U/L (ref 98–192)

## 2020-10-29 LAB — T4, FREE: Free T4: 0.69 ng/dL (ref 0.61–1.12)

## 2020-10-29 LAB — OSMOLALITY: Osmolality: 298 mOsm/kg — ABNORMAL HIGH (ref 275–295)

## 2020-10-29 LAB — CK: Total CK: 37 U/L — ABNORMAL LOW (ref 49–397)

## 2020-10-29 LAB — BETA-HYDROXYBUTYRIC ACID
Beta-Hydroxybutyric Acid: 2.85 mmol/L — ABNORMAL HIGH (ref 0.05–0.27)
Beta-Hydroxybutyric Acid: 3.81 mmol/L — ABNORMAL HIGH (ref 0.05–0.27)

## 2020-10-29 LAB — C-REACTIVE PROTEIN: CRP: 18 mg/dL — ABNORMAL HIGH (ref <1.0)

## 2020-10-29 LAB — LACTIC ACID, PLASMA: Lactic Acid, Venous: 1.8 mmol/L (ref 0.5–1.9)

## 2020-10-29 LAB — LIPASE, BLOOD: Lipase: 20 U/L (ref 11–51)

## 2020-10-29 LAB — MAGNESIUM: Magnesium: 2.3 mg/dL (ref 1.7–2.4)

## 2020-10-29 LAB — MRSA PCR SCREENING: MRSA by PCR: NEGATIVE

## 2020-10-29 LAB — HEMOGLOBIN A1C
Hgb A1c MFr Bld: 11.7 % — ABNORMAL HIGH (ref 4.8–5.6)
Mean Plasma Glucose: 289.09 mg/dL

## 2020-10-29 LAB — SEDIMENTATION RATE: Sed Rate: 53 mm/hr — ABNORMAL HIGH (ref 0–16)

## 2020-10-29 LAB — TSH: TSH: 56.769 u[IU]/mL — ABNORMAL HIGH (ref 0.350–4.500)

## 2020-10-29 MED ORDER — POTASSIUM CHLORIDE CRYS ER 20 MEQ PO TBCR
40.0000 meq | EXTENDED_RELEASE_TABLET | Freq: Once | ORAL | Status: AC
  Administered 2020-10-29: 40 meq via ORAL
  Filled 2020-10-29: qty 2

## 2020-10-29 MED ORDER — DIPHENHYDRAMINE HCL 50 MG/ML IJ SOLN
25.0000 mg | Freq: Once | INTRAMUSCULAR | Status: AC
  Administered 2020-10-29: 25 mg via INTRAVENOUS
  Filled 2020-10-29: qty 1

## 2020-10-29 MED ORDER — OXYCODONE HCL 5 MG PO TABS
5.0000 mg | ORAL_TABLET | Freq: Four times a day (QID) | ORAL | Status: DC | PRN
  Administered 2020-10-29 – 2020-11-03 (×13): 5 mg via ORAL
  Filled 2020-10-29 (×13): qty 1

## 2020-10-29 MED ORDER — POTASSIUM CHLORIDE CRYS ER 20 MEQ PO TBCR: 40.0000 meq | EXTENDED_RELEASE_TABLET | Freq: Once | ORAL | Status: DC

## 2020-10-29 MED ORDER — INSULIN ASPART 100 UNIT/ML ~~LOC~~ SOLN
0.0000 [IU] | Freq: Three times a day (TID) | SUBCUTANEOUS | Status: DC
  Administered 2020-10-29: 5 [IU] via SUBCUTANEOUS
  Administered 2020-10-29: 2 [IU] via SUBCUTANEOUS
  Administered 2020-10-30: 9 [IU] via SUBCUTANEOUS
  Administered 2020-10-30 (×2): 5 [IU] via SUBCUTANEOUS
  Administered 2020-10-31 (×2): 7 [IU] via SUBCUTANEOUS
  Administered 2020-11-01: 9 [IU] via SUBCUTANEOUS
  Administered 2020-11-01: 7 [IU] via SUBCUTANEOUS
  Administered 2020-11-01 – 2020-11-02 (×3): 5 [IU] via SUBCUTANEOUS
  Administered 2020-11-02: 2 [IU] via SUBCUTANEOUS
  Administered 2020-11-03: 7 [IU] via SUBCUTANEOUS
  Administered 2020-11-03: 2 [IU] via SUBCUTANEOUS

## 2020-10-29 MED ORDER — SODIUM CHLORIDE 0.9 % IV SOLN
INTRAVENOUS | Status: DC | PRN
  Administered 2020-10-29 – 2020-11-01 (×3): 250 mL via INTRAVENOUS

## 2020-10-29 MED ORDER — HYDROXYZINE HCL 50 MG/ML IM SOLN
25.0000 mg | Freq: Once | INTRAMUSCULAR | Status: AC
  Administered 2020-10-29: 25 mg via INTRAMUSCULAR
  Filled 2020-10-29: qty 0.5

## 2020-10-29 MED ORDER — SODIUM CHLORIDE 0.9 % IV SOLN
1.0000 g | INTRAVENOUS | Status: DC
  Administered 2020-10-29: 1 g via INTRAVENOUS
  Filled 2020-10-29: qty 10

## 2020-10-29 MED ORDER — LACTATED RINGERS IV SOLN: INTRAVENOUS | Status: AC

## 2020-10-29 MED ORDER — SODIUM CHLORIDE 0.9 % IV SOLN
2.0000 g | Freq: Three times a day (TID) | INTRAVENOUS | Status: DC
  Administered 2020-10-29 (×2): 2 g via INTRAVENOUS
  Filled 2020-10-29 (×3): qty 2

## 2020-10-29 MED ORDER — LEVOTHYROXINE SODIUM 75 MCG PO TABS
150.0000 ug | ORAL_TABLET | Freq: Every day | ORAL | Status: DC
  Administered 2020-10-30 – 2020-11-03 (×5): 150 ug via ORAL
  Filled 2020-10-29 (×5): qty 2

## 2020-10-29 MED ORDER — VANCOMYCIN HCL IN DEXTROSE 1-5 GM/200ML-% IV SOLN
1000.0000 mg | Freq: Once | INTRAVENOUS | Status: AC
  Administered 2020-10-29: 1000 mg via INTRAVENOUS
  Filled 2020-10-29 (×2): qty 200

## 2020-10-29 MED ORDER — GLUCERNA SHAKE PO LIQD
237.0000 mL | Freq: Two times a day (BID) | ORAL | Status: DC
  Administered 2020-10-29 – 2020-11-03 (×10): 237 mL via ORAL
  Filled 2020-10-29 (×12): qty 237

## 2020-10-29 MED ORDER — ORAL CARE MOUTH RINSE
15.0000 mL | Freq: Two times a day (BID) | OROMUCOSAL | Status: DC
  Administered 2020-10-29 – 2020-11-03 (×10): 15 mL via OROMUCOSAL

## 2020-10-29 MED ORDER — ENOXAPARIN SODIUM 40 MG/0.4ML ~~LOC~~ SOLN
40.0000 mg | SUBCUTANEOUS | Status: DC
  Administered 2020-10-29 – 2020-11-01 (×4): 40 mg via SUBCUTANEOUS
  Filled 2020-10-29 (×5): qty 0.4

## 2020-10-29 MED ORDER — HYDROMORPHONE HCL 1 MG/ML IJ SOLN
0.5000 mg | INTRAMUSCULAR | Status: DC | PRN
  Administered 2020-10-29 – 2020-11-03 (×19): 0.5 mg via INTRAVENOUS
  Filled 2020-10-29 (×5): qty 0.5
  Filled 2020-10-29: qty 1
  Filled 2020-10-29 (×15): qty 0.5

## 2020-10-29 MED ORDER — ADULT MULTIVITAMIN W/MINERALS CH
1.0000 | ORAL_TABLET | Freq: Every day | ORAL | Status: DC
  Administered 2020-10-29 – 2020-11-03 (×6): 1 via ORAL
  Filled 2020-10-29 (×6): qty 1

## 2020-10-29 MED ORDER — INSULIN ASPART 100 UNIT/ML ~~LOC~~ SOLN
0.0000 [IU] | Freq: Every day | SUBCUTANEOUS | Status: DC
  Administered 2020-10-29: 3 [IU] via SUBCUTANEOUS
  Administered 2020-10-31: 5 [IU] via SUBCUTANEOUS
  Administered 2020-11-01: 3 [IU] via SUBCUTANEOUS

## 2020-10-29 MED ORDER — FAMOTIDINE IN NACL 20-0.9 MG/50ML-% IV SOLN
20.0000 mg | Freq: Once | INTRAVENOUS | Status: AC
  Administered 2020-10-29: 20 mg via INTRAVENOUS
  Filled 2020-10-29: qty 50

## 2020-10-29 MED ORDER — SODIUM CHLORIDE 0.9 % IV BOLUS
1000.0000 mL | Freq: Once | INTRAVENOUS | Status: AC
  Administered 2020-10-29: 1000 mL via INTRAVENOUS

## 2020-10-29 MED ORDER — INSULIN GLARGINE 100 UNIT/ML ~~LOC~~ SOLN
10.0000 [IU] | Freq: Every day | SUBCUTANEOUS | Status: DC
  Administered 2020-10-29: 10 [IU] via SUBCUTANEOUS
  Filled 2020-10-29 (×2): qty 0.1

## 2020-10-29 MED ORDER — HYDROMORPHONE HCL 1 MG/ML IJ SOLN
0.5000 mg | Freq: Once | INTRAMUSCULAR | Status: AC
  Administered 2020-10-29: 0.5 mg via INTRAVENOUS
  Filled 2020-10-29: qty 1

## 2020-10-29 MED ORDER — POTASSIUM CHLORIDE 20 MEQ PO PACK
40.0000 meq | PACK | Freq: Once | ORAL | Status: AC
  Administered 2020-10-29: 40 meq via ORAL
  Filled 2020-10-29: qty 2

## 2020-10-29 MED ORDER — PROSOURCE PLUS PO LIQD
30.0000 mL | Freq: Two times a day (BID) | ORAL | Status: DC
  Administered 2020-10-29 – 2020-11-03 (×9): 30 mL via ORAL
  Filled 2020-10-29 (×8): qty 30

## 2020-10-29 MED ORDER — GABAPENTIN 100 MG PO CAPS
100.0000 mg | ORAL_CAPSULE | Freq: Two times a day (BID) | ORAL | Status: DC
  Administered 2020-10-29 – 2020-11-03 (×10): 100 mg via ORAL
  Filled 2020-10-29 (×10): qty 1

## 2020-10-29 MED ORDER — HYDROXYZINE HCL 50 MG/ML IM SOLN: 50.0000 mg | Freq: Once | INTRAMUSCULAR | Status: DC

## 2020-10-29 MED ORDER — SENNA 8.6 MG PO TABS
1.0000 | ORAL_TABLET | Freq: Every day | ORAL | Status: DC
  Administered 2020-10-29 – 2020-11-02 (×3): 8.6 mg via ORAL
  Filled 2020-10-29 (×5): qty 1

## 2020-10-29 MED ORDER — VANCOMYCIN HCL 1500 MG/300ML IV SOLN
1500.0000 mg | INTRAVENOUS | Status: DC
  Filled 2020-10-29: qty 300

## 2020-10-29 MED ORDER — CHLORHEXIDINE GLUCONATE CLOTH 2 % EX PADS
6.0000 | MEDICATED_PAD | Freq: Every day | CUTANEOUS | Status: DC
  Administered 2020-10-29 – 2020-10-31 (×3): 6 via TOPICAL

## 2020-10-29 NOTE — ED Notes (Signed)
ED TO INPATIENT HANDOFF REPORT  Name/Age/Gender David Ramsey 25 y.o. male  Code Status    Code Status Orders  (From admission, onward)         Start     Ordered   10/28/20 2344  Full code  Continuous        10/28/20 2346        Code Status History    Date Active Date Inactive Code Status Order ID Comments User Context   02/17/2017 0549 02/18/2017 1952 Full Code [id number]  Eduard Clos, MD ED   12/16/2016 0324 12/17/2016 0003 Full Code [id number]  Danford, Earl Lites, MD ED   Advance Care Planning Activity      Home/SNF/Other Home  Chief Complaint DKA, type 1 (HCC) [E10.10]  Level of Care/Admitting Diagnosis ED Disposition    ED Disposition Condition Comment   Admit  Hospital Area: [facility] [[id number]]  Level of Care: Stepdown [14]  Admit to SDU based on following criteria: Severe physiological/psychological symptoms:  Any diagnosis requiring assessment & intervention at least every 4 hours on an ongoing basis to obtain desired patient outcomes including stability and rehabilitation  Covid Evaluation: Asymptomatic Screening Protocol (No Symptoms)  Diagnosis: DKA, type 1 [facility]) [[id number]]  Admitting Physician: John Giovanni [[id number]]  Attending Physician: John Giovanni [[id number]]       Medical History Past Medical History:  Diagnosis Date  . Diabetes mellitus without complication (HCC)    INSULIN DEPENDENT   SINCE AGE 51  . DKA (diabetic ketoacidoses) 02/17/2017  . Hashimoto's disease   . MRSA infection   . Pneumonia    Liver contusion, lung effusion, sepsis multiorgan failure, no clear source    Allergies Allergies  Allergen Reactions  . Ibuprofen Other (See Comments)    WANT TO AVOID LIVER FAILURE, SO PATIENT WAS TOLD NOT TO TAKE    IV Location/Drains/Wounds Patient Lines/Drains/Airways Status    Active Line/Drains/Airways    Name Placement date Placement time Site Days   Peripheral IV 10/28/20 Left Hand 10/28/20   --  Hand  1   Peripheral IV 10/28/20 Right;Posterior Hand 10/28/20  2024  Hand  1   Wound / Incision (Open or Dehisced) 02/17/17 Other (Comment) Leg Bilateral circular lesions, .75 to 1.5cm dia, scattered over both legs, some scabbed, some healing, some have been bleeding. 02/17/17  1423  Leg  1350          Labs/Imaging Results for orders placed or performed during the hospital encounter of 10/28/20 (from the past 48 hour(s))  Basic metabolic panel     Status: Abnormal   Collection Time: 10/28/20  5:16 PM  Result Value Ref Range   Sodium 132 (L) 135 - 145 mmol/L    Comment: REPEATED TO VERIFY   Potassium 4.3 3.5 - 5.1 mmol/L    Comment: REPEATED TO VERIFY   Chloride 97 (L) 98 - 111 mmol/L    Comment: REPEATED TO VERIFY   CO2 7 (L) 22 - 32 mmol/L    Comment: REPEATED TO VERIFY   Glucose, Bld 426 (H) 70 - 99 mg/dL    Comment: Glucose reference range applies only to samples taken after fasting for at least 8 hours.   BUN 18 6 - 20 mg/dL   Creatinine, Ser 2.02 (H) 0.61 - 1.24 mg/dL   Calcium 9.2 8.9 - 54.2 mg/dL    Comment: REPEATED TO VERIFY   GFR, Estimated >60 >60 mL/min    Comment: (NOTE) Calculated using the CKD-EPI  Creatinine Equation (2021)    Anion gap 28 (H) 5 - 15    Comment: Performed at [facility], [address] [place], [place] [address]  CBC     Status: Abnormal   Collection Time: 10/28/20  5:16 PM  Result Value Ref Range   WBC 23.0 (H) 4.0 - 10.5 K/uL   RBC 4.90 4.22 - 5.81 MIL/uL   Hemoglobin 15.0 13.0 - 17.0 g/dL   HCT 24.2 35.3 - 61.4 %   MCV 91.2 80.0 - 100.0 fL   MCH 30.6 26.0 - 34.0 pg   MCHC 33.6 30.0 - 36.0 g/dL   RDW 43.1 54.0 - 08.6 %   Platelets 534 (H) 150 - 400 K/uL   nRBC 0.0 0.0 - 0.2 %    Comment: Performed at [facility], [address], [place], [place] [address]  CBG monitoring, ED     Status: Abnormal   Collection Time: 10/28/20  5:49 PM  Result Value Ref Range   Glucose-Capillary 458 (H) 70 -  99 mg/dL    Comment: Glucose reference range applies only to samples taken after fasting for at least 8 hours.  Urinalysis, Routine w reflex microscopic Urine, Clean Catch     Status: Abnormal   Collection Time: 10/28/20  6:27 PM  Result Value Ref Range   Color, Urine STRAW (A) YELLOW   APPearance CLEAR CLEAR   Specific Gravity, Urine 1.012 1.005 - 1.030   pH 5.0 5.0 - 8.0   Glucose, UA >=500 (A) NEGATIVE mg/dL   Hgb urine dipstick MODERATE (A) NEGATIVE   Bilirubin Urine NEGATIVE NEGATIVE   Ketones, ur 80 (A) NEGATIVE mg/dL   Protein, ur NEGATIVE NEGATIVE mg/dL   Nitrite NEGATIVE NEGATIVE   Leukocytes,Ua NEGATIVE NEGATIVE   RBC / HPF 0-5 0 - 5 RBC/hpf   WBC, UA 0-5 0 - 5 WBC/hpf   Bacteria, UA RARE (A) NONE SEEN   Squamous Epithelial / LPF 0-5 0 - 5   Mucus PRESENT     Comment: Performed at [facility], [address], [place], [place] [address]  Beta-hydroxybutyric acid     Status: Abnormal   Collection Time: 10/28/20  7:54 PM  Result Value Ref Range   Beta-Hydroxybutyric Acid >8.00 (H) 0.05 - 0.27 mmol/L    Comment: RESULTS CONFIRMED BY MANUAL DILUTION Performed at [facility], [address], [place], [place] [address]   CBG monitoring, ED     Status: Abnormal   Collection Time: 10/28/20  8:27 PM  Result Value Ref Range   Glucose-Capillary 440 (H) 70 - 99 mg/dL    Comment: Glucose reference range applies only to samples taken after fasting for at least 8 hours.  Blood gas, venous     Status: Abnormal   Collection Time: 10/28/20  8:45 PM  Result Value Ref Range   pH, Ven 7.110 (LL) 7.250 - 7.430    Comment: CRITICAL RESULT CALLED TO, READ BACK BY AND VERIFIED WITH: KATIE WICKER @ 2113 ON 10/28/20 C VARNER    pCO2, Ven 17.2 (LL) 44.0 - 60.0 mmHg    Comment: CRITICAL RESULT CALLED TO, READ BACK BY AND VERIFIED WITH: KATIE WICKER @ 2113 ON 10/28/20 C VARNER    pO2, Ven 50.5 (H) 32.0 - 45.0 mmHg   Bicarbonate 5.2 (L) 20.0 - 28.0 mmol/L    Acid-base deficit 24.2 (H) 0.0 - 2.0 mmol/L   O2 Saturation 78.8 %   Patient temperature 98.6  Comment: Performed at [facility], [address], [place], [place] [address]  CBG monitoring, ED     Status: Abnormal   Collection Time: 10/28/20  9:33 PM  Result Value Ref Range   Glucose-Capillary 382 (H) 70 - 99 mg/dL    Comment: Glucose reference range applies only to samples taken after fasting for at least 8 hours.  CBG monitoring, ED     Status: Abnormal   Collection Time: 10/28/20 10:42 PM  Result Value Ref Range   Glucose-Capillary 343 (H) 70 - 99 mg/dL    Comment: Glucose reference range applies only to samples taken after fasting for at least 8 hours.  CBG monitoring, ED     Status: Abnormal   Collection Time: 10/28/20 11:47 PM  Result Value Ref Range   Glucose-Capillary 313 (H) 70 - 99 mg/dL    Comment: Glucose reference range applies only to samples taken after fasting for at least 8 hours.   DG Chest Portable 1 View  Result Date: 10/28/2020 CLINICAL DATA:  Leukocytosis EXAM: PORTABLE CHEST 1 VIEW COMPARISON:  None. FINDINGS: Lungs are clear. Heart size and pulmonary vascularity are normal. No adenopathy. No bone lesions. IMPRESSION: Lungs clear.  Cardiac silhouette normal. Electronically Signed   By: Bretta Bang III M.D.   On: 10/28/2020 21:09    Pending Labs Unresulted Labs (From admission, onward)          Start     Ordered   10/29/20 0500  CBC  Tomorrow morning,   R        10/28/20 2347   10/28/20 2351  Hepatic function panel  Once,   STAT        10/28/20 2350   10/28/20 2350  Sedimentation rate  Once,   STAT        10/28/20 2349   10/28/20 2350  C-reactive protein  Once,   STAT        10/28/20 2349   10/28/20 2350  Lipase, blood  Once,   STAT        10/28/20 2350   10/28/20 2349  Procalcitonin - Baseline  ONCE - STAT,   STAT        10/28/20 2348   10/28/20 2348  Osmolality  Once,   STAT        10/28/20 2347   10/28/20 2348   Lactic acid, plasma  ONCE - STAT,   STAT        10/28/20 2347   10/28/20 2345  Basic metabolic panel  Now then every 4 hours,   R (with STAT occurrences)      10/28/20 2346   10/28/20 2343  HIV Antibody (routine testing w rflx)  (HIV Antibody (Routine testing w reflex) panel)  Once,   STAT        10/28/20 2346   10/28/20 2014  TSH  ONCE - STAT,   STAT        10/28/20 2013   10/28/20 1956  Culture, blood (routine x 2)  BLOOD CULTURE X 2,   STAT      10/28/20 1955   10/28/20 1954  Beta-hydroxybutyric acid  (Diabetes Ketoacidosis (DKA))  Now then every 8 hours,   STAT      10/28/20 1955          Vitals/Pain Today's Vitals   10/28/20 1855 10/28/20 2032 10/28/20 2145 10/28/20 2245  BP: 134/81  (!) 154/105 (!) 138/93  Pulse: 90  (!) 126 (!) 106  Resp:  18  (!) 35 19  Temp:      TempSrc:      SpO2: 98%  99% 99%  Weight:  70.3 kg    Height:  5\' 6"  (1.676 m)      Isolation Precautions No active isolations  Medications Medications  insulin regular, human (MYXREDLIN) 100 units/ 100 mL infusion (10 Units/hr Intravenous Rate/Dose Change 10/28/20 2358)  dextrose 5 % in lactated ringers infusion (0 mLs Intravenous Hold 10/28/20 1956)  dextrose 50 % solution 0-50 mL (has no administration in time range)  lactated ringers bolus 1,000 mL (1,000 mLs Intravenous New Bag/Given (Non-Interop) 10/28/20 2030)  lactated ringers infusion (has no administration in time range)  sodium bicarbonate 150 mEq in sterile water 1,000 mL infusion (has no administration in time range)  ondansetron (ZOFRAN) injection 4 mg (has no administration in time range)  acetaminophen (TYLENOL) tablet 650 mg (has no administration in time range)    Or  acetaminophen (TYLENOL) suppository 650 mg (has no administration in time range)  metroNIDAZOLE (FLAGYL) IVPB 500 mg (has no administration in time range)  diphenhydrAMINE (BENADRYL) injection 25 mg (has no administration in time range)  ceFEPIme (MAXIPIME) 2 g in sodium  chloride 0.9 % 100 mL IVPB (has no administration in time range)  vancomycin (VANCOCIN) IVPB 1000 mg/200 mL premix (has no administration in time range)  potassium chloride 10 mEq in 100 mL IVPB (0 mEq Intravenous Stopped 10/28/20 2235)  ondansetron (ZOFRAN) injection 4 mg (4 mg Intravenous Given 10/28/20 2025)    Mobility walks

## 2020-10-29 NOTE — Progress Notes (Signed)
Pharmacy Antibiotic Note  David Ramsey is a 25 y.o. male admitted on 10/28/2020 with history significant of type 1 diabetes, acquired hypothyroidism due to history of Hashimoto's disease presented to the ED for evaluation of hyperglycemia, nausea, and vomiting.  Patient reported not taking his insulin for the past few months. Patient states he is having an "infection" all over his body and is itching. Pharmacy has been consulted for vancomycin and cefepime dosing.  Plan: Cefepime 2gm IV q8h Vancomycin 1gm IV x 1 then 1500mg  q24h (AUC 476.8, Scr 1.49) Follow renal function, cultures and clinical course   Height: 5\' 6"  (167.6 cm) Weight: 70.3 kg (155 lb) IBW/kg (Calculated) : 63.8  Temp (24hrs), Avg:97.8 F (36.6 C), Min:97.8 F (36.6 C), Max:97.8 F (36.6 C)  Recent Labs  Lab 10/28/20 1716  WBC 23.0*  CREATININE 1.49*    Estimated Creatinine Clearance: 69.6 mL/min (A) (by C-G formula based on SCr of 1.49 mg/dL (H)).    Allergies  Allergen Reactions  . Ibuprofen Other (See Comments)    WANT TO AVOID LIVER FAILURE, SO PATIENT WAS TOLD NOT TO TAKE     Thank you for allowing pharmacy to be a part of this patient's care.  RPh 10/29/2020, 12:06 AM

## 2020-10-29 NOTE — Progress Notes (Signed)
Labs drawn including redraw for BMET with all infusions stopped. Venipuncture done on opposite arm from IV sites.

## 2020-10-29 NOTE — Progress Notes (Signed)
PROGRESS NOTE  David Ramsey:[id number] DOB: 1996-04-04 DOA: 10/28/2020 PCP: Patient, No Pcp Per  HPI/Recap of past 24 hours: David Ramsey is a 25 y.o. 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  Patient reported not taking his insulin for the past few months.  In the ED, afebrile.  Tachycardic and tachypneic.  Not hypotensive or hypoxic.  Labs showing WBC 23.0, hemoglobin 15.0, platelet count 534K. Sodium 132, potassium 4.3, chloride 97, bicarb 7, anion gap 28, BUN 18, creatinine 1.4, glucose 426.  UA with ketones.  Beta hydroxybutyric acid level pending.  VBG with pH 7.1, PCO2 17.  TSH pending.  Blood culture x2 pending.  Chest x-ray negative for acute cardiopulmonary abnormality.  PCCM consulted and felt that the patient did not need ICU admission.   Reports noticing skin lesions on his legs initially about 2 months ago which are now all over.  States he was seen at urgent care for this problem and prescribed antibiotics which did not help.  For the past 2 days he is having fevers as high as 103 F.  He has been vomiting.  Denies abdominal pain or diarrhea.  States he is a type I diabetic and has been on insulin since the age of 89.  He was previously on Lantus and Humalog but has not been able to take them for the past 3 months due to not being able to afford them.  States he has not been able to make any appointments with his doctor and has not heard back from their office.  States he bought Novolin 70/30 from [place] but is not using it regularly.   Admitted for DKA type I, transitionned to subcu insulin.  10/29/20: Seen and examined at his bedside.  Reports having a sore throat. Declined regular consistency diet for now and wants to be started on liquid diet.  Full liquid diet started.  Denies any abdominal pain at the time of this visit.   Denies any respiratory symptoms.  Assessment/Plan: Principal Problem:   DKA, type 1 (HCC) Active Problems:   Hypothyroidism (acquired)   Nausea & vomiting   Leukocytosis   Rash  Severe DKA type I, secondary to medication noncompliance Presented with serum glucose 426, serum bicarb 7, anion gap 28, ketonuria, beta hydroxybutyrate acid greater than 8.0, VBG pH 7.1. Completed DKA protocol and transitioned to subcu insulin DC isotonic bicarb drip and D5 LR Continue LR IV fluid at 100 cc/h Hemoglobin A1c 11.7 on 10/29/20  Diffuse petechial rash, etiology. Was started on broad-spectrum IV antibiotics on admission MRSA Negative on 10/29/2020 DC IV vancomycin, cefepime and Flagyl Plan continue Rocephin for now  Acquired hypothyroidism due to history of Hashimoto's disease TSH greater than 50 Obtain free T4, follow results. Resume home levothyroxine  Hypokalemia Repleted orally DC isotonic bicarb which can also contribute Repeat BMP in the morning  Leukocytosis, possibly reactive in the setting of DKA Urine negative for pyuria Blood culture negative to date. No evidence of pneumonia on chest x-ray Currently on Rocephin, DC if clinically improving    Code Status: Full code.  Family Communication: None at bedside.  Disposition Plan: Likely will discharge to home on 10/30/2020 if can tolerate a solid diet.  Will need assistance for his insulin and other home medications.   Consultants:  None.  Procedures:  None.  Antimicrobials:  Rocephin  DVT  prophylaxis: Subcu Lovenox daily  Status is: Inpatient    Dispo:  Patient From: Home  Planned Disposition: Home  Expected discharge date: 10/31/2020  Medically stable for discharge: No, ongoing management of DKA, electrolyte derangement.          Objective: Vitals:   10/29/20 1219 10/29/20 1300 10/29/20 1400 10/29/20 1500  BP: (!) 105/41  (!) 114/47   Pulse: 96 97 (!) 102 (!) 109  Resp: 17 16 16 15   Temp:       TempSrc:      SpO2: 98% 99% 100% 100%  Weight:      Height:        Intake/Output Summary (Last 24 hours) at 10/29/2020 1614 Last data filed at 10/29/2020 1028 Gross per 24 hour  Intake 4750.84 ml  Output 1425 ml  Net 3325.84 ml   Filed Weights   10/28/20 2032  Weight: 70.3 kg    Exam:  . General: 25 y.o. year-old male well developed well nourished in no acute distress.  Alert and oriented x3. . Cardiovascular: Regular rate and rhythm with no rubs or gallops.  No thyromegaly or JVD noted.   30 Respiratory: Clear to auscultation with no wheezes or rales. Good inspiratory effort. . Abdomen: Soft nontender nondistended with normal bowel sounds x4 quadrants. . Musculoskeletal: No lower extremity edema. 2/4 pulses in all 4 extremities. . Skin: Rashes involving lower extremities and upper extremities. Marland Kitchen Psychiatry: Mood is appropriate for condition and setting   Data Reviewed: CBC: Recent Labs  Lab 10/28/20 1716 10/29/20 0303  WBC 23.0* 13.9*  HGB 15.0 11.4*  HCT 44.7 32.6*  MCV 91.2 87.6  PLT 534* 400   Basic Metabolic Panel: Recent Labs  Lab 10/28/20 1716 10/28/20 2343 10/29/20 0303 10/29/20 0524 10/29/20 0617  NA 132* 132* 132* 133* 135  K 4.3 5.0 3.1* 3.2* 3.3*  CL 97* 102 94* 101 103  CO2 7* 11* 24 20* 24  GLUCOSE 426* 298* 603* 242* 216*  BUN 18 17 15 15 15   CREATININE 1.49* 1.53* 1.04 1.09 1.06  CALCIUM 9.2 9.0 7.8* 8.6* 8.5*  MG  --   --  2.3  --   --    GFR: Estimated Creatinine Clearance: 97.8 mL/min (by C-G formula based on SCr of 1.06 mg/dL). Liver Function Tests: Recent Labs  Lab 10/28/20 2343  AST 34  ALT 15  ALKPHOS 112  BILITOT 2.9*  PROT 7.3  ALBUMIN 3.6   Recent Labs  Lab 10/28/20 2343  LIPASE 20   No results for input(s): AMMONIA in the last 168 hours. Coagulation Profile: No results for input(s): INR, PROTIME in the last 168 hours. Cardiac Enzymes: Recent Labs  Lab 10/29/20 0524  CKTOTAL 37*   BNP (last 3 results) No  results for input(s): PROBNP in the last 8760 hours. HbA1C: Recent Labs    10/29/20 0303  HGBA1C 11.7*   CBG: Recent Labs  Lab 10/29/20 0756 10/29/20 0857 10/29/20 1002 10/29/20 1112 10/29/20 1202  GLUCAP 169* 151* 144* 160* 155*   Lipid Profile: No results for input(s): CHOL, HDL, LDLCALC, TRIG, CHOLHDL, LDLDIRECT in the last 72 hours. Thyroid Function Tests: Recent Labs    10/28/20 2014  TSH 56.769*   Anemia Panel: No results for input(s): VITAMINB12, FOLATE, FERRITIN, TIBC, IRON, RETICCTPCT in the last 72 hours. Urine analysis:    Component Value Date/Time   COLORURINE STRAW (A) 10/28/2020 1827   APPEARANCEUR CLEAR 10/28/2020 1827   LABSPEC 1.012 10/28/2020 1827   PHURINE  5.0 10/28/2020 1827   GLUCOSEU >=500 (A) 10/28/2020 1827   HGBUR MODERATE (A) 10/28/2020 1827   BILIRUBINUR NEGATIVE 10/28/2020 1827   KETONESUR 80 (A) 10/28/2020 1827   PROTEINUR NEGATIVE 10/28/2020 1827   NITRITE NEGATIVE 10/28/2020 1827   LEUKOCYTESUR NEGATIVE 10/28/2020 1827   Sepsis Labs: @LABRCNTIP (procalcitonin:4,lacticidven:4)  ) Recent Results (from the past 240 hour(s))  Culture, blood (routine x 2)     Status: None (Preliminary result)   Collection Time: 10/28/20  8:30 PM   Specimen: BLOOD  Result Value Ref Range Status   Specimen Description   Final    BLOOD RIGHT WRIST Performed at [facility], [address] [place], [place]    Special Requests   Final    BOTTLES DRAWN AEROBIC AND ANAEROBIC Blood Culture adequate volume Performed at [facility], [address] [place], [place]    Culture   Final    NO GROWTH < 12 HOURS Performed at [facility], [address] [place], [place]    Report Status PENDING  Incomplete  Culture, blood (routine x 2)     Status: None (Preliminary result)   Collection Time: 10/28/20  8:30 PM   Specimen: BLOOD RIGHT FOREARM  Result Value Ref Range Status    Specimen Description   Final    BLOOD RIGHT FOREARM Performed at [facility], [address] [place], [place]    Special Requests   Final    BOTTLES DRAWN AEROBIC AND ANAEROBIC Blood Culture results may not be optimal due to an inadequate volume of blood received in culture bottles Performed at [facility], [address], [place], [place]    Culture   Final    NO GROWTH < 12 HOURS Performed at [facility], [address], [place], [place]    Report Status PENDING  Incomplete  SARS CORONAVIRUS 2 (TAT 6-24 HRS) Nasopharyngeal Nasopharyngeal Swab     Status: None   Collection Time: 10/29/20 12:21 AM   Specimen: Nasopharyngeal Swab  Result Value Ref Range Status   SARS Coronavirus 2 NEGATIVE NEGATIVE Final    Comment: (NOTE) SARS-CoV-2 target nucleic acids are NOT DETECTED.  The SARS-CoV-2 RNA is generally detectable in upper and lower respiratory specimens during the acute phase of infection. Negative results do not preclude SARS-CoV-2 infection, do not rule out co-infections with other pathogens, and should not be used as the sole basis for treatment or other patient management decisions. Negative results must be combined with clinical observations, patient history, and epidemiological information. The expected result is Negative.  Fact Sheet for Patients: 10/31/20  Fact Sheet for Healthcare Providers: HairSlick.no  This test is not yet approved or cleared by the quierodirigir.com FDA and  has been authorized for detection and/or diagnosis of SARS-CoV-2 by FDA under an Emergency Use Authorization (EUA). This EUA will remain  in effect (meaning this test can be used) for the duration of the COVID-19 declaration under Se ction 564(b)(1) of the Act, 21 U.S.C. section 360bbb-3(b)(1), unless the authorization is terminated or revoked  sooner.  Performed at [facility], [address], [place], [place]   MRSA PCR Screening     Status: None   Collection Time: 10/29/20  1:06 AM   Specimen: Nasopharyngeal  Result Value Ref Range Status   MRSA by PCR NEGATIVE NEGATIVE Final    Comment:  The GeneXpert MRSA Assay (FDA approved for NASAL specimens only), is one component of a comprehensive MRSA colonization surveillance program. It is not intended to diagnose MRSA infection nor to guide or monitor treatment for MRSA infections. Performed at [facility], [address] [place], [place] [id number]       Studies: DG Chest Portable 1 View  Result Date: 10/28/2020 CLINICAL DATA:  Leukocytosis EXAM: PORTABLE CHEST 1 VIEW COMPARISON:  None. FINDINGS: Lungs are clear. Heart size and pulmonary vascularity are normal. No adenopathy. No bone lesions. IMPRESSION: Lungs clear.  Cardiac silhouette normal. Electronically Signed   By: Bretta BangWilliam  Woodruff III M.D.   On: 10/28/2020 21:09    Scheduled Meds: . (feeding supplement) PROSource Plus  30 mL Oral BID BM  . Chlorhexidine Gluconate Cloth  6 each Topical Daily  . feeding supplement (GLUCERNA SHAKE)  237 mL Oral BID BM  . insulin aspart  0-5 Units Subcutaneous QHS  . insulin aspart  0-9 Units Subcutaneous TID WC  . insulin glargine  10 Units Subcutaneous Daily  . mouth rinse  15 mL Mouth Rinse BID  . multivitamin with minerals  1 tablet Oral Daily  . senna  1 tablet Oral QHS    Continuous Infusions: . sodium chloride 250 mL (10/29/20 1228)  . cefTRIAXone (ROCEPHIN)  IV 1 g (10/29/20 1551)  . lactated ringers 100 mL/hr at 10/29/20 1218     LOS: 0 days     David Droparole N Hall, MD [facility] Pager [phone]  If 7PM-7AM, please contact night-coverage [email or web address] Password [email or web address] 10/29/2020, 4:14 PM

## 2020-10-29 NOTE — TOC Initial Note (Signed)
Transition of Care [facility]) - Initial/Assessment Note    Patient Details  Name: David Ramsey MRN: [id number] Date of Birth: Jan 07, 1996  Transition of Care [facility] CM/SW Contact:    Golda Acre, RN Phone Number: 10/29/2020, 8:21 AM  Clinical Narrative:                  25 y.o. 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  Patient reported not taking his insulin for the past few months.  In the ED, afebrile.  Tachycardic and tachypneic.  Not hypotensive or hypoxic.  Labs showing WBC 23.0, hemoglobin 15.0, platelet count 534K. Sodium 132, potassium 4.3, chloride 97, bicarb 7, anion gap 28, BUN 18, creatinine 1.4, glucose 426.  UA with ketones.  Beta hydroxybutyric acid level pending.  VBG with pH 7.1, PCO2 17.  TSH pending.  Blood culture x2 pending.  Chest x-ray negative for acute cardiopulmonary abnormality.  PCCM consulted and felt that the patient did not need ICU admission.  Patient states he is having an "infection" all over his body and is itching.  Reports noticing skin lesions on his legs initially about 2 months ago which are now all over.  States he was seen at urgent care for this problem and prescribed antibiotics which did not help.  For the past 2 days he is having fevers as high as 103 F.  He has been vomiting since yesterday.  Denies abdominal pain or diarrhea.  States he is a type I diabetic and has been on insulin since the age of 76.  He was previously on Lantus and Humalog but has not been able to take them for the past 3 months due to not being able to afford them.  States he has not been able to make any appointments with his doctor and has not heard back from their office.  States he bought Novolin 70/30 from [place] but is not using it regularly.  He is not vaccinated against COVID.  Denies headaches, cough, or shortness of  breath. PLAN- WILL NEED VISIT WITH THE DIABETIC COORDINATOR FOR EDUCATION ABOUT TAKING HIS INSULIN AS PRESCRIBED. PLAN IS TO RETURN TO HOME WITH SELF CARE.   Expected Discharge Plan: Home/Self Care Barriers to Discharge: Continued Medical Work up   Patient Goals and CMS Choice Patient states their goals for this hospitalization and ongoing recovery are:: TO GO HOME CMS Medicare.gov Compare Post Acute Care list provided to:: Patient    Expected Discharge Plan and Services Expected Discharge Plan: Home/Self Care   Discharge Planning Services: CM Consult   Living arrangements for the past 2 months: Single Family Home                                      Prior Living Arrangements/Services Living arrangements for the past 2 months: Single Family Home Lives with:: Spouse Patient language and need for interpreter reviewed:: Yes Do you feel safe going back to the place where you live?: Yes      Need for Family Participation in Patient Care: Yes (Comment) Care giver support system in place?: Yes (comment)   Criminal Activity/Legal Involvement Pertinent to Current Situation/Hospitalization: No - Comment as needed  Activities of Daily Living Home Assistive Devices/Equipment: CBG Meter ADL Screening (condition at time of admission) Patient's cognitive  ability adequate to safely complete daily activities?: Yes Is the patient deaf or have difficulty hearing?: No Does the patient have difficulty seeing, even when wearing glasses/contacts?: Yes Does the patient have difficulty concentrating, remembering, or making decisions?: Yes Patient able to express need for assistance with ADLs?: Yes Does the patient have difficulty dressing or bathing?: Yes Independently performs ADLs?: No Communication: Independent Dressing (OT): Needs assistance Is this a change from baseline?: Change from baseline, expected to last >3 days Grooming: Independent Feeding: Independent Bathing: Needs  assistance Is this a change from baseline?: Change from baseline, expected to last >3 days Toileting: Needs assistance Is this a change from baseline?: Change from baseline, expected to last >3days In/Out Bed: Needs assistance Is this a change from baseline?: Change from baseline, expected to last >3 days Walks in Home: Needs assistance Is this a change from baseline?: Change from baseline, expected to last >3 days Does the patient have difficulty walking or climbing stairs?: Yes Weakness of Legs: Both Weakness of Arms/Hands: Both  Permission Sought/Granted                  Emotional Assessment Appearance:: Appears stated age Attitude/Demeanor/Rapport: Engaged Affect (typically observed): Calm Orientation: : Oriented to Place,Oriented to Self,Oriented to  Time,Oriented to Situation Alcohol / Substance Use: Not Applicable Psych Involvement: No (comment)  Admission diagnosis:  DKA, type 1 (HCC) [E10.10] Diabetic ketoacidosis without coma associated with type 1 diabetes mellitus (HCC) [E10.10] Patient Active Problem List   Diagnosis Date Noted  . Leukocytosis 10/28/2020  . Rash 10/28/2020  . DKA, type 1 (HCC) 02/17/2017  . Nausea & vomiting 02/17/2017  . DKA (diabetic ketoacidoses) 12/16/2016  . Cellulitis 12/16/2016  . AKI (acute kidney injury) (HCC) 12/16/2016  . DKA, type 1, not at goal [facility]) 12/16/2016  . Hypothyroidism (acquired) 11/01/2014  . Type 1 diabetes mellitus with hyperglycemia (HCC) 08/15/2001   PCP:  Patient, No Pcp Per Pharmacy:   [facility] - [place] - [address] [place] [address] Phone: [phone] Fax: [phone]     Social Determinants of Health (SDOH) Interventions    Readmission Risk Interventions No flowsheet data found.

## 2020-10-29 NOTE — Progress Notes (Signed)
Initial Nutrition Assessment  DOCUMENTATION CODES:   Not applicable  INTERVENTION:  - will order Glucerna Shake BID, each supplement provides 220 kcal and 10 grams of protein. - will order 30 ml Prosource Plus BID, each supplement provides 100 kcal and 15 grams protein.  - will order 1 tablet multivitamin with minerals/day.  - complete NFPE when feasible.  NUTRITION DIAGNOSIS:   Increased nutrient needs related to acute illness,catabolic illness (COVID-19 infection) as evidenced by estimated needs.  GOAL:   Patient will meet greater than or equal to 90% of their needs  MONITOR:   PO intake,Supplement acceptance,Diet advancement,Labs,Weight trends  REASON FOR ASSESSMENT:   Malnutrition Screening Tool  ASSESSMENT:   25 y.o. male with medical history of type 1 DM (dx at 25 years old) and acquired hypothyroidism d/t hx of Hashimoto's disease. He presented to the ED due to hyperglycemia, fevers, and N/V. He reported to ED staff that he had not been taking insulin for 3 months d/t inability to afford. UA with ketones. Patient reported noticing lesions on his legs beginning 2 months ago and that abx prescribed at Urgent Care did not help. He denied abdominal pain or diarrhea PTA.  No intakes documented since admission. Unable to talk with patient at this time. He has not been seen by a [place] RD at any time in the past. Noted that he was dx with type 1 DM when he was 25 years-old and that he has been without insulin for the past 3 months (or without it on a consistent basis) d/t finances.  Weight yesterday was documented as 155 lb. PTA, the most recently documented weight was on 06/26/20 when he weighed 160 lb. This indicates 5 lb weight loss (3.1% body weight) in the past 4 months; not significant for time frame but unable to determine if weight loss occurred more acutely.   Per notes: - severe DKA - diffuse petechial rash - leukocytosis   Labs reviewed; CBGs: 144-241 mg/dl, K:  3.3 mmol/l, Ca: 8.5 mg/dl.  Medications reviewed; sliding scale novolog, 10 units lantus/day, 10 mEq IV KCl x2 runs 2/21, 40 mEq Klor-Con x2 doses 2/22, 1 tablet senokot/day.  IVF; LR @ 100 ml/hr.     NUTRITION - FOCUSED PHYSICAL EXAM:  unable to complete at this time.   Diet Order:   Diet Order            Diet full liquid Room service appropriate? Yes; Fluid consistency: Thin  Diet effective now                 EDUCATION NEEDS:   Not appropriate for education at this time  Skin:  Skin Assessment: Reviewed RN Assessment  Last BM:  PTA/unknown  Height:   Ht Readings from Last 1 Encounters:  10/28/20 5\' 6"  (1.676 m)    Weight:   Wt Readings from Last 1 Encounters:  10/28/20 70.3 kg    Estimated Nutritional Needs:  Kcal:  10/30/20 kcal Protein:  110-130 grams Fluid:  >/= 2.5 L/day      [phone], MS, RD, LDN, CNSC Inpatient Clinical Dietitian RD pager # available in AMION  After hours/weekend pager # available in [facility]

## 2020-10-29 NOTE — Consult Note (Addendum)
WOC Nurse Consult Note: Reason for Consult: Patient with circular lesions with red halos surrounding and center lesions covered with dried serum (scab vs eschar) on bilateral extremities and trunk. Patient reports that these are painful and itch. Wound type: Infectious, full thickness  Pressure Injury POA: N/A Measurement: Diffuse presentation Wound bed:As described above Drainage (amount, consistency, odor) None Periwound: As described above Dressing procedure/placement/frequency: I have provided Nursing with guidance for conservative topical care of these lesions using a nonadherent antimicrobial (xeroform). This may also function as an astringent and dry the lesions, decreasing pruritus.  I communicated my recommendation to Dr. Margo Aye via Secure Chat that ID be considered for a more definitive identification of etiology and POC. If you agree, please order/consult.  WOC nursing team will not follow, but will remain available to this patient, the nursing and medical teams.  Please re-consult if needed. Thanks, Ladona Mow, MSN, RN, GNP, [place]  Pager# [phone]

## 2020-10-29 NOTE — Progress Notes (Signed)
Unable to answer call from Alyson Reedy, RN in ED due to being in a COVID isolation room, but able to call her back within 5 minutes of initial call. Report received.

## 2020-10-29 NOTE — Progress Notes (Signed)
Patient arrived to unit foams to BLE feet, shins, knees, lower back rt buttock. Pt complained pain in circular lesion areas.

## 2020-10-29 NOTE — ED Notes (Signed)
Attempted to call report to [facility] RN, but no answer.

## 2020-10-29 NOTE — ED Notes (Signed)
Pt requesting nausea meds. Pt also has red rash on both arms and states the rash itches. MD made aware.

## 2020-10-30 DIAGNOSIS — E039 Hypothyroidism, unspecified: Secondary | ICD-10-CM | POA: Diagnosis not present

## 2020-10-30 DIAGNOSIS — E063 Autoimmune thyroiditis: Secondary | ICD-10-CM

## 2020-10-30 DIAGNOSIS — D72829 Elevated white blood cell count, unspecified: Secondary | ICD-10-CM | POA: Diagnosis not present

## 2020-10-30 DIAGNOSIS — E1010 Type 1 diabetes mellitus with ketoacidosis without coma: Principal | ICD-10-CM

## 2020-10-30 DIAGNOSIS — R21 Rash and other nonspecific skin eruption: Secondary | ICD-10-CM | POA: Diagnosis not present

## 2020-10-30 LAB — CBC
HCT: 35.5 % — ABNORMAL LOW (ref 39.0–52.0)
Hemoglobin: 12.2 g/dL — ABNORMAL LOW (ref 13.0–17.0)
MCH: 30.4 pg (ref 26.0–34.0)
MCHC: 34.4 g/dL (ref 30.0–36.0)
MCV: 88.5 fL (ref 80.0–100.0)
Platelets: 425 10*3/uL — ABNORMAL HIGH (ref 150–400)
RBC: 4.01 MIL/uL — ABNORMAL LOW (ref 4.22–5.81)
RDW: 12.4 % (ref 11.5–15.5)
WBC: 16.9 10*3/uL — ABNORMAL HIGH (ref 4.0–10.5)
nRBC: 0 % (ref 0.0–0.2)

## 2020-10-30 LAB — BASIC METABOLIC PANEL
Anion gap: 15 (ref 5–15)
BUN: 10 mg/dL (ref 6–20)
CO2: 21 mmol/L — ABNORMAL LOW (ref 22–32)
Calcium: 8.7 mg/dL — ABNORMAL LOW (ref 8.9–10.3)
Chloride: 97 mmol/L — ABNORMAL LOW (ref 98–111)
Creatinine, Ser: 1.19 mg/dL (ref 0.61–1.24)
GFR, Estimated: >60 mL/min (ref >60)
Glucose, Bld: 364 mg/dL — ABNORMAL HIGH (ref 70–99)
Potassium: 3.7 mmol/L (ref 3.5–5.1)
Sodium: 133 mmol/L — ABNORMAL LOW (ref 135–145)

## 2020-10-30 LAB — GLUCOSE, CAPILLARY
Glucose-Capillary: 305 mg/dL — ABNORMAL HIGH (ref 70–99)
Glucose-Capillary: 352 mg/dL — ABNORMAL HIGH (ref 70–99)
Glucose-Capillary: 269 mg/dL — ABNORMAL HIGH (ref 70–99)
Glucose-Capillary: 267 mg/dL — ABNORMAL HIGH (ref 70–99)
Glucose-Capillary: 175 mg/dL — ABNORMAL HIGH (ref 70–99)

## 2020-10-30 MED ORDER — HYDROXYZINE HCL 25 MG PO TABS: 25.0000 mg | ORAL_TABLET | Freq: Three times a day (TID) | ORAL | Status: DC | PRN

## 2020-10-30 MED ORDER — HYDROMORPHONE HCL 1 MG/ML IJ SOLN
0.5000 mg | Freq: Once | INTRAMUSCULAR | Status: AC
  Administered 2020-10-30: 0.5 mg via INTRAVENOUS
  Filled 2020-10-30: qty 0.5

## 2020-10-30 MED ORDER — INSULIN ASPART 100 UNIT/ML ~~LOC~~ SOLN
8.0000 [IU] | Freq: Three times a day (TID) | SUBCUTANEOUS | Status: DC
  Administered 2020-10-30 (×2): 8 [IU] via SUBCUTANEOUS

## 2020-10-30 MED ORDER — SODIUM CHLORIDE 0.9 % IV BOLUS
1000.0000 mL | Freq: Once | INTRAVENOUS | Status: AC
  Administered 2020-10-30: 1000 mL via INTRAVENOUS

## 2020-10-30 MED ORDER — HYDROXYZINE HCL 25 MG PO TABS: 25.0000 mg | ORAL_TABLET | Freq: Once | ORAL | Status: DC

## 2020-10-30 MED ORDER — METOPROLOL TARTRATE 5 MG/5ML IV SOLN
5.0000 mg | Freq: Four times a day (QID) | INTRAVENOUS | Status: DC | PRN
  Administered 2020-10-30: 5 mg via INTRAVENOUS
  Filled 2020-10-30: qty 5

## 2020-10-30 MED ORDER — PREDNISONE 20 MG PO TABS
40.0000 mg | ORAL_TABLET | Freq: Every day | ORAL | Status: DC
  Administered 2020-10-31 – 2020-11-03 (×4): 40 mg via ORAL
  Filled 2020-10-30 (×4): qty 2

## 2020-10-30 MED ORDER — DIPHENHYDRAMINE HCL 50 MG/ML IJ SOLN
25.0000 mg | Freq: Once | INTRAMUSCULAR | Status: AC
  Administered 2020-10-30: 25 mg via INTRAVENOUS
  Filled 2020-10-30: qty 1

## 2020-10-30 MED ORDER — LIP MEDEX EX OINT
TOPICAL_OINTMENT | CUTANEOUS | Status: DC | PRN
  Administered 2020-10-31: 1 via TOPICAL
  Filled 2020-10-30: qty 7

## 2020-10-30 MED ORDER — INSULIN GLARGINE 100 UNIT/ML ~~LOC~~ SOLN
24.0000 [IU] | Freq: Every day | SUBCUTANEOUS | Status: DC
  Administered 2020-10-30: 24 [IU] via SUBCUTANEOUS
  Filled 2020-10-30: qty 0.24

## 2020-10-30 MED ORDER — INSULIN ASPART 100 UNIT/ML ~~LOC~~ SOLN
10.0000 [IU] | Freq: Three times a day (TID) | SUBCUTANEOUS | Status: DC
  Administered 2020-10-31 – 2020-11-01 (×5): 10 [IU] via SUBCUTANEOUS

## 2020-10-30 MED ORDER — HYDROMORPHONE HCL 1 MG/ML IJ SOLN
0.5000 mg | Freq: Once | INTRAMUSCULAR | Status: AC
  Administered 2020-10-30: 0.5 mg via INTRAVENOUS

## 2020-10-30 MED ORDER — INSULIN GLARGINE 100 UNIT/ML ~~LOC~~ SOLN
28.0000 [IU] | Freq: Every day | SUBCUTANEOUS | Status: DC
  Administered 2020-10-31: 28 [IU] via SUBCUTANEOUS
  Filled 2020-10-30: qty 0.28

## 2020-10-30 MED ORDER — HYDROCORTISONE 1 % EX CREA
TOPICAL_CREAM | Freq: Two times a day (BID) | CUTANEOUS | Status: DC
  Filled 2020-10-30: qty 28

## 2020-10-30 MED ORDER — LACTATED RINGERS IV BOLUS
500.0000 mL | Freq: Once | INTRAVENOUS | Status: AC
  Administered 2020-10-30: 500 mL via INTRAVENOUS

## 2020-10-30 MED ORDER — METHYLPREDNISOLONE SODIUM SUCC 125 MG IJ SOLR
125.0000 mg | Freq: Once | INTRAMUSCULAR | Status: AC
  Administered 2020-10-30: 125 mg via INTRAVENOUS
  Filled 2020-10-30: qty 2

## 2020-10-30 NOTE — Plan of Care (Signed)
  Problem: Clinical Measurements: Goal: Will remain free from infection Outcome: Progressing   Problem: Activity: Goal: Risk for activity intolerance will decrease Outcome: Progressing   Problem: Skin Integrity: Goal: Risk for impaired skin integrity will decrease Outcome: Progressing   Problem: Metabolic: Goal: Ability to maintain appropriate glucose levels will improve Outcome: Progressing   Problem: Pain Managment: Goal: General experience of comfort will improve Outcome: Progressing

## 2020-10-30 NOTE — Consult Note (Signed)
[facility]    Date of Admission:  10/28/2020     Reason for Consult: Rash and concern for infection     Referring Physician: [facility] hospitalist  Current antibiotics: Day 2 of ceftriaxone  Previous antibiotics: Vancomycin Cefepime Metronidazole  Principal Problem:   DKA, type 1 (HCC) Active Problems:   Hypothyroidism (acquired)   Nausea & vomiting   Leukocytosis   Rash   ASSESSMENT:    Diffuse skin rash: Not entirely convincing for an infectious etiology given the chronicity of his rash and areas of hyperkeratosis.  Suspect he may have significant eczema leading to extensive scratching which may represent a portal for bacterial inoculation.  However, lesions do not appear superinfected at this time and he is currently afebrile.  Suspect his leukocytosis is more related to DKA in the setting of negative blood cultures. Poorly controlled type 1 diabetes: Presenting with DKA Hashimoto's disease  PLAN:    Recommend stopping his antibiotics and observing Follow-up blood cultures to ensure negative Would benefit from dermatology evaluation Could consider topical corticosteroids or even a trial of systemic steroids unable to control his blood sugars  HPI:    David Ramsey is a 25 y.o. 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  He presented to the ED on 10/29/2020 with hyperglycemia, nausea, and vomiting in the setting of trying to manage his type 1 diabetes at home with a 70/30.  He was found to be afebrile, tachycardic, and tachypneic.  He was hemodynamically stable.  His labs showed elevated leukocytosis of 23.0, hemoglobin 15, platelets 534, sodium 132, potassium 4.3.  Bicarb 7, anion gap 28, BUN 18, creatinine 1.4.  Glucose 426.  Urinalysis with ketones.  He was started on an insulin drip and admitted.  Blood  cultures were obtained.  He was placed on broad-spectrum antibiotics with vancomycin, cefepime, Flagyl.  MRSA nares PCR is negative and he was deescalated to ceftriaxone monotherapy.  Patient reports dealing with numerous skin lesions for many months and rashes for years.  He has previously been evaluated by dermatology but this was a long time ago and has not followed up since.  He reports fevers at home as high as 103 F, however, he has been afebrile here.  He is a type I diabetic and has been on insulin since a child.  Previously followed in [place] and on insulin, however, has been unable to follow-up with them for several months and he has been taking 70/30 and trying to manage his blood sugars on his own.  Prior to this admission he was seen late September with numerous skin lesions.  An attempt was made to culture this, however, no purulence was noted and unable to be done as a result.  He was given 7 days of doxycycline which did not result in significant improvement.  He reports that this rash is incredibly itchy and he attempts to not scratch but it is hard to do so.  He has not been using any topical creams or been taking any steroids.  He is originally from [place] however moved to [place] approximately 6 or 7 years ago where he currently lives with his wife and daughter.  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  He has no unusual hobbies or exposures.  He has 2 cats  at home, however, he reports that these cats do not like or bite his skin.  He reports drinking alcohol occasionally, uses electronic nicotine devices, and denies any other history of drug use including injection drugs.   Past Medical History:  Diagnosis Date   Diabetes mellitus without complication (HCC)    INSULIN DEPENDENT   SINCE AGE 27   DKA (diabetic ketoacidoses) 02/17/2017   Hashimoto's disease    MRSA infection    Pneumonia    Liver  contusion, lung effusion, sepsis multiorgan failure, no clear source    Social History   Tobacco Use   Smoking status: Former Smoker    Packs/day: 0.50    Years: 4.00    Pack years: 2.00    Types: Cigarettes    Quit date: 05/23/2017    Years since quitting: 3.4   Smokeless tobacco: Never Used  Vaping Use   Vaping Use: Every day   Substances: Nicotine, Flavoring  Substance Use Topics   Alcohol use: Yes    Comment: occ   Drug use: No    Family History  Problem Relation Age of Onset   Diabetes Cousin    Heart disease Other    Cancer Other    Thyroid disease Other    Healthy Mother     Allergies  Allergen Reactions   Ibuprofen Other (See Comments)    WANT TO AVOID LIVER FAILURE, SO PATIENT WAS TOLD NOT TO TAKE    Review of Systems  Constitutional: Positive for fever.  Respiratory: Negative.   Cardiovascular: Negative.   Gastrointestinal: Negative for abdominal pain, diarrhea, nausea and vomiting.  Genitourinary: Negative.   Musculoskeletal: Negative for back pain and joint pain.  Skin: Positive for itching and rash.  Endo/Heme/Allergies: Positive for polydipsia.  All other systems reviewed and are negative.   OBJECTIVE:   Blood pressure 121/67, pulse (!) 132, temperature 99.4 F (37.4 C), temperature source Oral, resp. rate 20, height 5\' 6"  (1.676 m), weight 70.3 kg, SpO2 98 %. Body mass index is 25.02 kg/m.  Physical Exam Constitutional:      General: He is not in acute distress.    Appearance: Normal appearance.  HENT:     Head: Normocephalic and atraumatic.  Cardiovascular:     Rate and Rhythm: Normal rate and regular rhythm.  Pulmonary:     Effort: Pulmonary effort is normal. No respiratory distress.  Abdominal:     General: There is no distension.     Palpations: Abdomen is soft.     Tenderness: There is no abdominal tenderness.  Musculoskeletal:        General: No swelling or tenderness.  Skin:    Findings: Erythema, lesion and  rash present.     Comments: He has numerous skin lesions in various stages of healing with evidence of excoriation.  No significant purulence or drainage noted.  He also has a diffuse eczematous appearing rash on his upper and lower extremities.  Neurological:     General: No focal deficit present.     Mental Status: He is alert and oriented to person, place, and time.  Psychiatric:        Mood and Affect: Mood normal.        Behavior: Behavior normal.      Lab Results: Lab Results  Component Value Date   WBC 16.9 (H) 10/30/2020   HGB 12.2 (L) 10/30/2020   HCT 35.5 (L) 10/30/2020   MCV 88.5 10/30/2020   PLT 425 (H) 10/30/2020  Lab Results  Component Value Date   NA 133 (L) 10/30/2020   K 3.7 10/30/2020   CO2 21 (L) 10/30/2020   GLUCOSE 364 (H) 10/30/2020   BUN 10 10/30/2020   CREATININE 1.19 10/30/2020   CALCIUM 8.7 (L) 10/30/2020   GFRNONAA >60 10/30/2020   GFRAA >60 02/18/2017    Lab Results  Component Value Date   ALT 15 10/28/2020   AST 34 10/28/2020   ALKPHOS 112 10/28/2020   BILITOT 2.9 (H) 10/28/2020       Component Value Date/Time   CRP 18.0 (H) 10/28/2020 2350       Component Value Date/Time   ESRSEDRATE 53 (H) 10/28/2020 2343    I have reviewed the micro and lab results in Epic.  Imaging: DG Chest Portable 1 View  Result Date: 10/28/2020 CLINICAL DATA:  Leukocytosis EXAM: PORTABLE CHEST 1 VIEW COMPARISON:  None. FINDINGS: Lungs are clear. Heart size and pulmonary vascularity are normal. No adenopathy. No bone lesions. IMPRESSION: Lungs clear.  Cardiac silhouette normal. Electronically Signed   By: Bretta Bang III M.D.   On: 10/28/2020 21:09     Imaging independently reviewed in Epic.  Vedia Coffer for Infectious Disease [facility] [phone] pager 10/30/2020, 1:14 PM

## 2020-10-30 NOTE — Progress Notes (Signed)
This RN was notified by CCMD that Pt HR sustaining to 150-170 SVT and pt c/o pain and severe itching, notified MD on call and orders given,will continue POC.

## 2020-10-30 NOTE — Progress Notes (Signed)
Inpatient Diabetes Program Recommendations  AACE/ADA: New Consensus Statement on Inpatient Glycemic Control (2015)  Target Ranges:  Prepandial:   less than 140 mg/dL      Peak postprandial:   less than 180 mg/dL (1-2 hours)      Critically ill patients:  140 - 180 mg/dL   Lab Results  Component Value Date   GLUCAP 352 (H) 10/30/2020   HGBA1C 11.7 (H) 10/29/2020    Review of Glycemic Control  Diabetes history: DM1 Outpatient Diabetes medications: Lantus 30 units QHS, Humalog 1:8 CHO + CF20 with goal of 100 mg/dL, also was taking Novolin 70/30 when his Humalog ran out Current orders for Inpatient glycemic control: Lantus 190 units QD, Novolog 0-9 units TID with meals and 0-5 HS  HgbA1C - 11.7% - pt states HgbA1C is usually a lot higher. CBGs this am - 364, 352 mg/dL Needs insulin titration and meal coverage.  Inpatient Diabetes Program Recommendations:     Increase Lantus to 24 units QD Add Novolog 8 units TID with meals if eating > 50% meal  Spoke with pt at length on 2/22 at bedside about his diabetes control, HgbA1C, and lack of medical care recently. Pt does not have insurance and cannot afford Lantus and Novolog. Has not seen his Endo "in awhile." Missed appt with him, and has been unable to make new appt. MD would not refill his meds.   Needs prior to discharge:  New appt with [facility] for diabetes management Assistance from [facility] with both Lantus and Humalog. Prefers vial/syringe instead of pen.  Recently had been taking very small amount of Lantus and 70/30 TID.  Needs assistance with obtaining MD and insulin.   Will order TOC for [facility] referral and assistance with insulin at d/c.  Continue to follow.  Thank you. Ailene Ards, RD, LDN, CDE Inpatient Diabetes Coordinator [phone]

## 2020-10-30 NOTE — Progress Notes (Signed)
Pt HR non sustaining to 120-150  10/30/20 0208  Assess: MEWS Score  Temp 98.2 F (36.8 C)  BP 114/65  Pulse Rate (!) 122  Resp 18  SpO2 95 %  O2 Device Room Air  Assess: MEWS Score  MEWS Temp 0  MEWS Systolic 0  MEWS Pulse 2  MEWS RR 0  MEWS LOC 0  MEWS Score 2  MEWS Score Color Yellow

## 2020-10-30 NOTE — Progress Notes (Signed)
PROGRESS NOTE    [name]  [name]:[id number] DOB: 1996-01-01 DOA: 10/28/2020 PCP: Patient, No Pcp Per   Brief Narrative:  HPI per Dr. Shela Leff on 10/28/20 David Ramsey is a 25 y.o. 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  Patient reported not taking his insulin for the past few months.  In the ED, afebrile.  Tachycardic and tachypneic.  Not hypotensive or hypoxic.  Labs showing WBC 23.0, hemoglobin 15.0, platelet count 534K. Sodium 132, potassium 4.3, chloride 97, bicarb 7, anion gap 28, BUN 18, creatinine 1.4, glucose 426.  UA with ketones.  Beta hydroxybutyric acid level pending.  VBG with pH 7.1, PCO2 17.  TSH pending.  Blood culture x2 pending.  Chest x-ray negative for acute cardiopulmonary abnormality.  PCCM consulted and felt that the patient did not need ICU admission.  Patient states he is having an "infection" all over his body and is itching.  Reports noticing skin lesions on his legs initially about 2 months ago which are now all over.  States he was seen at urgent care for this problem and prescribed antibiotics which did not help.  For the past 2 days he is having fevers as high as 103 F.  He has been vomiting since yesterday.  Denies abdominal pain or diarrhea.  States he is a type I diabetic and has been on insulin since the age of 26.  He was previously on Lantus and Humalog but has not been able to take them for the past 3 months due to not being able to afford them.  States he has not been able to make any appointments with his doctor and has not heard back from their office.  States he bought Novolin 70/30 from [place] but is not using it regularly.  He is not vaccinated against COVID.  Denies headaches, cough, or shortness of breath.  **Interim History Sling is being adjusted as he continues to have some  hyperglycemia.  ID was consulted given concern for infectious etiology of his rash and excoriations.  Antibiotics were stopped and he did not spiked a temperature.  May need to reinitiate antibiotics.  Rash is getting worse so we will give a dose of Solu-Medrol 125 mg x 1 and obtain a punch biopsy in the morning by consulting general surgery.  Assessment & Plan:   Principal Problem:   DKA, type 1 ([place]) Active Problems:   Hypothyroidism (acquired)   Nausea & vomiting   Leukocytosis   Rash   Severe DKA type I, secondary to medication noncompliance -Presented with serum glucose 426, serum bicarb 7, anion gap 28, ketonuria, beta hydroxybutyrate acid greater than 8.0, VBG pH 7.1. -Completed DKA protocol and transitioned to subcu insulin -DC isotonic bicarb drip and D5 LR -Continue LR IV fluid at 100 cc/h -Hemoglobin A1c 11.7 on 10/29/20 -CBG's ranging from 198-305 -Increased Lantus to 28 units and added 10 units Novolog sq TIDwm in addition to [place] -Diabetes Education Coordinator consulted and appreciate further evaluation and recommendations  Diffuse Rash with Excoriation, unclear etiology. Worsening  -Was started on broad-spectrum IV antibiotics on admission and now stopped by ID -MRSA Negative on 10/29/2020 -DC IV vancomycin, cefepime and Flagyl -Continued Rocephin but ID stopped -CK was 37, LDH was 154, CRP was 18.0, PCT was 0.57, LA was 1.8, and ESR was 53 -C/w IV Benadryl 25 mg q8h, -Will give  IV Solumedrol 125 mg x1 and start Prednisone 40 mg po Daily after -Will ask General Surgery to come by in the AM and do a Punch Biopsy  -Check UDS -We will check ANCA and ANA testing along with syphilis and RPR as well as HIV (Negative)   Acquired Hypothyroidism due to history of Hashimoto's disease -TSH was 56.769 -Obtained free T4 and was normal at 0.69 -Resume home levothyroxine at 150 mcg po Daily   Hypokalemia -Repleted orally -DC isotonic bicarb which can also  contribute -Continue to monitor and replete as necessary -Repeat BMP in the morning  SIRS with Leukocytosis and now Fever, possibly reactive in the setting of DKA -Urine negative for pyuria or protein but did show Rare Bacteria  0-5 RBC/HPF, 0-5 WBC -Blood culture negative to date. -No evidence of pneumonia on chest x-ray -Was on IV Ceftriaxone but stopped by ID -CK was 37, LDH was 154, CRP was 18.0, PCT was 0.57, LA was 1.8, and ESR was 53 -May need to reinitiate antibiotics but will continue to monitor and treat temperature -WBC went from 23.0 -> 13.9 -> 16.9 -We will ask ID to weigh in further again  Normocytic Anemia -Patient's Hgb/Hct went from 15.0/44.7 (Hemoconcentrated on Admission) and went to 11.4/32.6 and today is 12.2/35.5 -Check Anemia Panel in the AM  -Continue to Monitor for S/Sx of Bleeding; Currently no overt bleeding noted -Repeat CBC in the AM   Thrombocytosis -In the setting of Above -Platelet Count went from 534 -> 400 -> 425 -Continue to Monitor and Trend -Repeat CBC in the AM  HypoNatremia (suspect PseudoHyponatremia) -Patient's Na+ is 133 and in the setting of Hyperglycemia -Continue to Monitor and Trend -Given 2 Liters of IVF Boluses today  -Repeat CMP in the AM  Metabolic Acidosis -Mild -Patient's CO2 is now 21, AG is 15, and Chloride Level is 97 -Continue to Monitor and Trend -Repeat CMP in the AM   Hyperbilirubinemia -Patient's T Bili was 2.9 (Indirect Bilirubin 2.4 and Direct was 0.5) -Continue to Monitor and Trend -Repeat CMP in AM   DVT prophylaxis: Enoxaparin 40 mg sq q24h Code Status: FULL CODE  Family Communication: No family present at bedside  Disposition Plan: Pending further clinical improvement   Status is: Inpatient  Remains inpatient appropriate because:Unsafe d/c plan, IV treatments appropriate due to intensity of illness or inability to take PO and Inpatient level of care appropriate due to severity of  illness   Dispo:  Patient From: Home  Planned Disposition: Home  Expected discharge date: 10/30/2020  Medically stable for discharge: No     Consultants:   Infectious Diseases    Procedures: None  Antimicrobials:  Anti-infectives (From admission, onward)   Start     Dose/Rate Route Frequency Ordered Stop   10/29/20 2000  vancomycin (VANCOREADY) IVPB 1500 mg/300 mL  Status:  Discontinued        1,500 mg 150 mL/hr over 120 Minutes Intravenous Every 24 hours 10/29/20 0203 10/29/20 1507   10/29/20 1600  cefTRIAXone (ROCEPHIN) 1 g in sodium chloride 0.9 % 100 mL IVPB        1 g 200 mL/hr over 30 Minutes Intravenous Every 24 hours 10/29/20 1507     10/29/20 0015  ceFEPIme (MAXIPIME) 2 g in sodium chloride 0.9 % 100 mL IVPB  Status:  Discontinued        2 g 200 mL/hr over 30 Minutes Intravenous Every 8 hours 10/29/20 0004 10/29/20 1507   10/29/20 0015  vancomycin (VANCOCIN)  IVPB 1000 mg/200 mL premix        1,000 mg 200 mL/hr over 60 Minutes Intravenous  Once 10/29/20 0004 10/29/20 0300   10/29/20 0000  metroNIDAZOLE (FLAGYL) IVPB 500 mg  Status:  Discontinued        500 mg 100 mL/hr over 60 Minutes Intravenous Every 8 hours 10/28/20 2346 10/29/20 1507        Subjective: Seen and examined and was lethargic and complaining of significant itching and back pain. No CP or SOB but complaining of Dry Throat. Thinks rash is worsening and continues not feel as well.   Objective: Vitals:   10/30/20 0200 10/30/20 0208 10/30/20 0415 10/30/20 0650  BP:  114/65 131/64 123/61  Pulse:  (!) 122 (!) 135 (!) (P) 145  Resp: 19 18 20 19   Temp:  98.2 F (36.8 C) 99.1 F (37.3 C) 99.6 F (37.6 C)  TempSrc:  Oral Oral Oral  SpO2:  95% 95% 93%  Weight:      Height:        Intake/Output Summary (Last 24 hours) at 10/30/2020 0758 Last data filed at 10/30/2020 0600 Gross per 24 hour  Intake 4225.4 ml  Output --  Net 4225.4 ml   Filed Weights   10/28/20 2032  Weight: 70.3 kg    Examination: Physical Exam:  Constitutional: WN/WD mildly obese Caucasian male in mild distress appears slightly uncomfortable Eyes: Lids and conjunctivae normal, sclerae anicteric  ENMT: External Ears, Nose appear normal. Grossly normal hearing. Neck: Appears normal, supple, no cervical masses, normal ROM, no appreciable thyromegaly; no JVD Respiratory: Diminished to auscultation bilaterally, no wheezing, rales, rhonchi or crackles. Normal respiratory effort and patient is not tachypenic. No accessory muscle use.  Cardiovascular: RRR, no murmurs / rubs / gallops. S1 and S2 auscultated. Mild LE edema Abdomen: Soft, non-tender, Distended slightly. Bowel sounds positive.  GU: Deferred. Musculoskeletal: No clubbing / cyanosis of digits/nails.  Normal strength and muscle tone.  Skin: Diffuse whole body rash with significant excoriations on the buttocks on the dorsal aspect of the legs. No induration; Warm and dry.  Neurologic: CN 2-12 grossly intact with no focal deficits. Romberg sign and cerebellar reflexes not assessed.  Psychiatric: Normal judgment and insight. Alert and oriented x 3. Normal mood and appropriate affect.   Data Reviewed: I have personally reviewed following labs and imaging studies  CBC: Recent Labs  Lab 10/28/20 1716 10/29/20 0303 10/30/20 0520  WBC 23.0* 13.9* 16.9*  HGB 15.0 11.4* 12.2*  HCT 44.7 32.6* 35.5*  MCV 91.2 87.6 88.5  PLT 534* 400 364*   Basic Metabolic Panel: Recent Labs  Lab 10/28/20 2343 10/29/20 0303 10/29/20 0524 10/29/20 0617 10/30/20 0520  NA 132* 132* 133* 135 133*  K 5.0 3.1* 3.2* 3.3* 3.7  CL 102 94* 101 103 97*  CO2 11* 24 20* 24 21*  GLUCOSE 298* 603* 242* 216* 364*  BUN 17 15 15 15 10   CREATININE 1.53* 1.04 1.09 1.06 1.19  CALCIUM 9.0 7.8* 8.6* 8.5* 8.7*  MG  --  2.3  --   --   --    GFR: Estimated Creatinine Clearance: 87.1 mL/min (by C-G formula based on SCr of 1.19 mg/dL). Liver Function Tests: Recent Labs  Lab  10/28/20 2343  AST 34  ALT 15  ALKPHOS 112  BILITOT 2.9*  PROT 7.3  ALBUMIN 3.6   Recent Labs  Lab 10/28/20 2343  LIPASE 20   No results for input(s): AMMONIA in the last 168 hours.  Coagulation Profile: No results for input(s): INR, PROTIME in the last 168 hours. Cardiac Enzymes: Recent Labs  Lab 10/29/20 0524  CKTOTAL 37*   BNP (last 3 results) No results for input(s): PROBNP in the last 8760 hours. HbA1C: Recent Labs    10/29/20 0303  HGBA1C 11.7*   CBG: Recent Labs  Lab 10/29/20 1202 10/29/20 1643 10/29/20 2054 10/30/20 0302 10/30/20 0729  GLUCAP 155* 294* 276* 305* 352*   Lipid Profile: No results for input(s): CHOL, HDL, LDLCALC, TRIG, CHOLHDL, LDLDIRECT in the last 72 hours. Thyroid Function Tests: Recent Labs    10/28/20 2014 10/29/20 1639  TSH 56.769*  --   FREET4  --  0.69   Anemia Panel: No results for input(s): VITAMINB12, FOLATE, FERRITIN, TIBC, IRON, RETICCTPCT in the last 72 hours. Sepsis Labs: Recent Labs  Lab 10/28/20 2343 10/28/20 2348  PROCALCITON 0.57  --   LATICACIDVEN  --  1.8    Recent Results (from the past 240 hour(s))  Culture, blood (routine x 2)     Status: None (Preliminary result)   Collection Time: 10/28/20  8:30 PM   Specimen: BLOOD  Result Value Ref Range Status   Specimen Description   Final    BLOOD RIGHT WRIST Performed at [facility] [address], [place], [place] [address]    Special Requests   Final    BOTTLES DRAWN AEROBIC AND ANAEROBIC Blood Culture adequate volume Performed at [facility] [address], [place], [place] [address]    Culture   Final    NO GROWTH 2 DAYS Performed at [facility] [address] [place], [place] [address]    Report Status PENDING  Incomplete  Culture, blood (routine x 2)     Status: None (Preliminary result)   Collection Time: 10/28/20  8:30 PM   Specimen: BLOOD RIGHT FOREARM  Result Value Ref Range Status    Specimen Description   Final    BLOOD RIGHT FOREARM Performed at [facility] [address] [place], [place] [address]    Special Requests   Final    BOTTLES DRAWN AEROBIC AND ANAEROBIC Blood Culture results may not be optimal due to an inadequate volume of blood received in culture bottles Performed at [facility] [address], [place], [place] [address]    Culture   Final    NO GROWTH 2 DAYS Performed at [facility] [address], [place],  [address]    Report Status PENDING  Incomplete  SARS CORONAVIRUS 2 (TAT 6-24 HRS) Nasopharyngeal Nasopharyngeal Swab     Status: None   Collection Time: 10/29/20 12:21 AM   Specimen: Nasopharyngeal Swab  Result Value Ref Range Status   SARS Coronavirus 2 NEGATIVE NEGATIVE Final    Comment: (NOTE) SARS-CoV-2 target nucleic acids are NOT DETECTED.  The SARS-CoV-2 RNA is generally detectable in upper and lower respiratory specimens during the acute phase of infection. Negative results do not preclude SARS-CoV-2 infection, do not rule out co-infections with other pathogens, and should not be used as the sole basis for treatment or other patient management decisions. Negative results must be combined with clinical observations, patient history, and epidemiological information. The expected result is Negative.  Fact Sheet for Patients: SugarRoll.be  Fact Sheet for Healthcare Providers: https://www.woods-mathews.com/  This test is not yet approved or cleared by the [place] FDA and  has been authorized for detection and/or diagnosis of SARS-CoV-2 by FDA  under an Emergency Use Authorization (EUA). This EUA will remain  in effect (meaning this test can be used) for the duration of the COVID-19 declaration under Se ction 564(b)(1) of the Act, 21 U.S.C. section 360bbb-3(b)(1), unless the authorization is terminated or revoked  sooner.  Performed at [facility], [place] [address] [place], [place] [address]   MRSA PCR Screening     Status: None   Collection Time: 10/29/20  1:06 AM   Specimen: Nasopharyngeal  Result Value Ref Range Status   MRSA by PCR NEGATIVE NEGATIVE Final    Comment:        The GeneXpert MRSA Assay (FDA approved for NASAL specimens only), is one component of a comprehensive MRSA colonization surveillance program. It is not intended to diagnose MRSA infection nor to guide or monitor treatment for MRSA infections. Performed at [facility], [place] [address], [place],  [address]     RN Pressure Injury Documentation:   Estimated body mass index is 25.02 kg/m as calculated from the following:   Height as of this encounter: 5' 6"  (1.676 m).   Weight as of this encounter: 70.3 kg.  Malnutrition Type:  Nutrition Problem: Increased nutrient needs Etiology: acute illness,catabolic illness (SPJSU-19 infection)  Malnutrition Characteristics:  Signs/Symptoms: estimated needs   Nutrition Interventions:  Interventions: Glucerna shake,Prostat,MVI   Radiology Studies: DG Chest Portable 1 View  Result Date: 10/28/2020 CLINICAL DATA:  Leukocytosis EXAM: PORTABLE CHEST 1 VIEW COMPARISON:  None. FINDINGS: Lungs are clear. Heart size and pulmonary vascularity are normal. No adenopathy. No bone lesions. IMPRESSION: Lungs clear.  Cardiac silhouette normal. Electronically Signed   By: Lowella Grip III M.D.   On: 10/28/2020 21:09   Scheduled Meds: . (feeding supplement) PROSource Plus  30 mL Oral BID BM  . Chlorhexidine Gluconate Cloth  6 each Topical Daily  . enoxaparin (LOVENOX) injection  40 mg Subcutaneous Q24H  . feeding supplement (GLUCERNA SHAKE)  237 mL Oral BID BM  . gabapentin  100 mg Oral BID  . insulin aspart  0-5 Units Subcutaneous QHS  . insulin aspart  0-9 Units Subcutaneous TID WC  . insulin aspart  8 Units Subcutaneous TID WC  . insulin  glargine  24 Units Subcutaneous Daily  . levothyroxine  150 mcg Oral Daily  . mouth rinse  15 mL Mouth Rinse BID  . multivitamin with minerals  1 tablet Oral Daily  . senna  1 tablet Oral QHS   Continuous Infusions: . sodium chloride 10 mL/hr at 10/29/20 1952  . cefTRIAXone (ROCEPHIN)  IV Stopped (10/29/20 1758)  . lactated ringers 100 mL/hr at 10/30/20 0416  . sodium chloride      LOS: 1 day   Kerney Elbe, DO [facility] PAGER is on AMION  If 7PM-7AM, please contact night-coverage [email or web address]

## 2020-10-30 NOTE — Progress Notes (Signed)
Pt HR sustaining to 120-150, notified MD on call and orders given and will continue POC.

## 2020-10-30 NOTE — Progress Notes (Signed)
Pt started to have increasing edema in both hands and feet. Pt presented a low grade fever, increased redness, hives, one episode of nausea, increased itching, and increased pain. Tylenol, benadryl, and zofran administered. MD notified: new med orders given. [place]

## 2020-10-31 ENCOUNTER — Inpatient Hospital Stay (HOSPITAL_COMMUNITY): Payer: BC Managed Care – PPO

## 2020-10-31 DIAGNOSIS — E1010 Type 1 diabetes mellitus with ketoacidosis without coma: Secondary | ICD-10-CM | POA: Diagnosis not present

## 2020-10-31 DIAGNOSIS — D72829 Elevated white blood cell count, unspecified: Secondary | ICD-10-CM

## 2020-10-31 DIAGNOSIS — E039 Hypothyroidism, unspecified: Secondary | ICD-10-CM

## 2020-10-31 DIAGNOSIS — R21 Rash and other nonspecific skin eruption: Secondary | ICD-10-CM | POA: Diagnosis not present

## 2020-10-31 DIAGNOSIS — R7989 Other specified abnormal findings of blood chemistry: Secondary | ICD-10-CM

## 2020-10-31 DIAGNOSIS — R112 Nausea with vomiting, unspecified: Secondary | ICD-10-CM

## 2020-10-31 LAB — IRON AND TIBC
Iron: 18 ug/dL — ABNORMAL LOW (ref 45–182)
Saturation Ratios: 10 % — ABNORMAL LOW (ref 17.9–39.5)
TIBC: 183 ug/dL — ABNORMAL LOW (ref 250–450)
UIBC: 165 ug/dL

## 2020-10-31 LAB — GLUCOSE, CAPILLARY
Glucose-Capillary: 464 mg/dL — ABNORMAL HIGH (ref 70–99)
Glucose-Capillary: 425 mg/dL — ABNORMAL HIGH (ref 70–99)
Glucose-Capillary: 367 mg/dL — ABNORMAL HIGH (ref 70–99)
Glucose-Capillary: 333 mg/dL — ABNORMAL HIGH (ref 70–99)
Glucose-Capillary: 368 mg/dL — ABNORMAL HIGH (ref 70–99)
Glucose-Capillary: 314 mg/dL — ABNORMAL HIGH (ref 70–99)
Glucose-Capillary: 363 mg/dL — ABNORMAL HIGH (ref 70–99)

## 2020-10-31 LAB — CBC WITH DIFFERENTIAL/PLATELET
Abs Immature Granulocytes: 0.05 10*3/uL (ref 0.00–0.07)
Basophils Absolute: 0 10*3/uL (ref 0.0–0.1)
Basophils Relative: 0 %
Eosinophils Absolute: 0 10*3/uL (ref 0.0–0.5)
Eosinophils Relative: 0 %
HCT: 33.9 % — ABNORMAL LOW (ref 39.0–52.0)
Hemoglobin: 11.6 g/dL — ABNORMAL LOW (ref 13.0–17.0)
Immature Granulocytes: 1 %
Lymphocytes Relative: 3 %
Lymphs Abs: 0.3 10*3/uL — ABNORMAL LOW (ref 0.7–4.0)
MCH: 30.6 pg (ref 26.0–34.0)
MCHC: 34.2 g/dL (ref 30.0–36.0)
MCV: 89.4 fL (ref 80.0–100.0)
Monocytes Absolute: 0.2 10*3/uL (ref 0.1–1.0)
Monocytes Relative: 2 %
Neutro Abs: 9.3 10*3/uL — ABNORMAL HIGH (ref 1.7–7.7)
Neutrophils Relative %: 94 %
Platelets: 325 10*3/uL (ref 150–400)
RBC: 3.79 MIL/uL — ABNORMAL LOW (ref 4.22–5.81)
RDW: 12.6 % (ref 11.5–15.5)
WBC: 9.9 10*3/uL (ref 4.0–10.5)
nRBC: 0 % (ref 0.0–0.2)

## 2020-10-31 LAB — COMPREHENSIVE METABOLIC PANEL
ALT: 174 U/L — ABNORMAL HIGH (ref 0–44)
AST: 355 U/L — ABNORMAL HIGH (ref 15–41)
Albumin: 3 g/dL — ABNORMAL LOW (ref 3.5–5.0)
Alkaline Phosphatase: 224 U/L — ABNORMAL HIGH (ref 38–126)
Anion gap: 11 (ref 5–15)
BUN: 11 mg/dL (ref 6–20)
CO2: 24 mmol/L (ref 22–32)
Calcium: 8.7 mg/dL — ABNORMAL LOW (ref 8.9–10.3)
Chloride: 99 mmol/L (ref 98–111)
Creatinine, Ser: 1.15 mg/dL (ref 0.61–1.24)
GFR, Estimated: >60 mL/min (ref >60)
Glucose, Bld: 520 mg/dL (ref 70–99)
Potassium: 4.4 mmol/L (ref 3.5–5.1)
Sodium: 134 mmol/L — ABNORMAL LOW (ref 135–145)
Total Bilirubin: 1.2 mg/dL (ref 0.3–1.2)
Total Protein: 6.3 g/dL — ABNORMAL LOW (ref 6.5–8.1)

## 2020-10-31 LAB — HIV ANTIBODY (ROUTINE TESTING W REFLEX): HIV Screen 4th Generation wRfx: NONREACTIVE

## 2020-10-31 LAB — SYPHILIS: RPR W/REFLEX TO RPR TITER AND TREPONEMAL ANTIBODIES, TRADITIONAL SCREENING AND DIAGNOSIS ALGORITHM: RPR Ser Ql: NONREACTIVE

## 2020-10-31 LAB — MAGNESIUM: Magnesium: 2 mg/dL (ref 1.7–2.4)

## 2020-10-31 LAB — VITAMIN B12: Vitamin B-12: 2301 pg/mL — ABNORMAL HIGH (ref 180–914)

## 2020-10-31 LAB — RETICULOCYTES
Immature Retic Fract: 22.5 % — ABNORMAL HIGH (ref 2.3–15.9)
RBC.: 3.75 MIL/uL — ABNORMAL LOW (ref 4.22–5.81)
Retic Count, Absolute: 61.9 10*3/uL (ref 19.0–186.0)
Retic Ct Pct: 1.7 % (ref 0.4–3.1)

## 2020-10-31 LAB — FOLATE: Folate: 12.4 ng/mL (ref >5.9)

## 2020-10-31 LAB — FERRITIN: Ferritin: 458 ng/mL — ABNORMAL HIGH (ref 24–336)

## 2020-10-31 LAB — PHOSPHORUS: Phosphorus: 1.9 mg/dL — ABNORMAL LOW (ref 2.5–4.6)

## 2020-10-31 MED ORDER — INSULIN GLARGINE 100 UNIT/ML ~~LOC~~ SOLN
30.0000 [IU] | Freq: Every day | SUBCUTANEOUS | Status: DC
  Administered 2020-11-01: 30 [IU] via SUBCUTANEOUS
  Filled 2020-10-31: qty 0.3

## 2020-10-31 MED ORDER — INSULIN ASPART 100 UNIT/ML ~~LOC~~ SOLN
8.0000 [IU] | Freq: Once | SUBCUTANEOUS | Status: AC
  Administered 2020-10-31: 8 [IU] via SUBCUTANEOUS

## 2020-10-31 MED ORDER — INSULIN ASPART 100 UNIT/ML ~~LOC~~ SOLN
10.0000 [IU] | Freq: Once | SUBCUTANEOUS | Status: AC
  Administered 2020-10-31: 10 [IU] via SUBCUTANEOUS

## 2020-10-31 MED ORDER — LIDOCAINE-EPINEPHRINE 1 %-1:100000 IJ SOLN
10.0000 mL | Freq: Once | INTRAMUSCULAR | Status: AC
  Administered 2020-10-31: 10 mL
  Filled 2020-10-31: qty 10

## 2020-10-31 MED ORDER — MUPIROCIN CALCIUM 2 % EX CREA
TOPICAL_CREAM | Freq: Two times a day (BID) | CUTANEOUS | Status: DC
  Filled 2020-10-31: qty 15

## 2020-10-31 MED ORDER — LIDOCAINE HCL (PF) 2 % IJ SOLN
0.0000 mL | Freq: Once | INTRAMUSCULAR | Status: DC | PRN
  Filled 2020-10-31: qty 20

## 2020-10-31 MED ORDER — K PHOS MONO-SOD PHOS DI & MONO 155-852-130 MG PO TABS
500.0000 mg | ORAL_TABLET | Freq: Two times a day (BID) | ORAL | Status: AC
  Administered 2020-10-31 (×2): 500 mg via ORAL
  Filled 2020-10-31 (×2): qty 2

## 2020-10-31 MED ORDER — TRIAMCINOLONE ACETONIDE 0.1 % EX CREA
TOPICAL_CREAM | Freq: Two times a day (BID) | CUTANEOUS | Status: DC
  Filled 2020-10-31: qty 15

## 2020-10-31 NOTE — Progress Notes (Signed)
PROGRESS NOTE    [name]  TIW:[id number] DOB: 03/30/1996 DOA: 10/28/2020 PCP: Patient, No Pcp Per   Brief Narrative:  HPI per Dr. Shela Leff on 10/28/20 David Ramsey is a 25 y.o. 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  Patient reported not taking his insulin for the past few months.  In the ED, afebrile.  Tachycardic and tachypneic.  Not hypotensive or hypoxic.  Labs showing WBC 23.0, hemoglobin 15.0, platelet count 534K. Sodium 132, potassium 4.3, chloride 97, bicarb 7, anion gap 28, BUN 18, creatinine 1.4, glucose 426.  UA with ketones.  Beta hydroxybutyric acid level pending.  VBG with pH 7.1, PCO2 17.  TSH pending.  Blood culture x2 pending.  Chest x-ray negative for acute cardiopulmonary abnormality.  PCCM consulted and felt that the patient did not need ICU admission.  Patient states he is having an "infection" all over his body and is itching.  Reports noticing skin lesions on his legs initially about 2 months ago which are now all over.  States he was seen at urgent care for this problem and prescribed antibiotics which did not help.  For the past 2 days he is having fevers as high as 103 F.  He has been vomiting since yesterday.  Denies abdominal pain or diarrhea.  States he is a type I diabetic and has been on insulin since the age of 69.  He was previously on Lantus and Humalog but has not been able to take them for the past 3 months due to not being able to afford them.  States he has not been able to make any appointments with his doctor and has not heard back from their office.  States he bought Novolin 70/30 from [place] but is not using it regularly.  He is not vaccinated against COVID.  Denies headaches, cough, or shortness of breath.  **Interim History Sling is being adjusted as he continues to have some  hyperglycemia.  ID was consulted given concern for infectious etiology of his rash and excoriations.  Antibiotics were stopped and he did not spiked a temperature.  May need to reinitiate antibiotics but will hold off for now.  Rash is getting worse so we will give a dose of Solu-Medrol 125 mg x 1 and this was done yesterday and we started prednisone 40 mill daily.  We will work the patient up for vasculitis and check ANCA and ANA as well as check a hepatitis panel given his abnormal LFTs.  Consulted general surgery for punch biopsy today .  Assessment & Plan:   Principal Problem:   DKA, type 1 ([place]) Active Problems:   Hypothyroidism (acquired)   Nausea & vomiting   Leukocytosis   Rash   Severe DKA type I, secondary to medication noncompliance -Presented with serum glucose 426, serum bicarb 7, anion gap 28, ketonuria, beta hydroxybutyrate acid greater than 8.0, VBG pH 7.1. -Completed DKA protocol and transitioned to subcu insulin -DC isotonic bicarb drip and D5 LR -Continued LR IV fluid at 100 cc/h stopped. -Hemoglobin A1c 11.7 on 10/29/20 -CBG's ranging from 198-305 -Increased Lantus to 30 units units and added 10 units Novolog sq TIDwm in addition to SSI -CBGs ranging from 175-464 -Diabetes Education Coordinator consulted and appreciate further evaluation and recommendations and following closely  Diffuse Rash with Excoriation, unclear etiology. Worsening  -Was started on broad-spectrum IV antibiotics  on admission and now stopped by ID -MRSA Negative on 10/29/2020 -DC IV vancomycin, cefepime and Flagyl -IV Rocephin has not been stopped by infectious diseases to -CK was 37, LDH was 154, CRP was 18.0, PCT was 0.57, LA was 1.8, and ESR was 53 -C/w IV Benadryl 25 mg q8h, -Will give IV Solumedrol 125 mg x1 and start Prednisone 40 mg po Daily and this was done -Will ask General Surgery to come by in the AM and do a Punch Biopsy  -Check UDS -We will check ANCA and ANA testing along with  syphilis and RPR which was negative as well as HIV (Negative)  -He has an area of induration on his lower back so we will obtain a CT scan to evaluate for infection -We will add mupirocin to open excoriated areas twice daily and stop hydrocortisone cream and start triamcinolone 0.1% cream -Follow-up on biopsy results by surgery And also added hydroxyzine 25 mg p.o. 3 times daily as needed itching  Abnormal LFTs -His AST went from 34 and trended up to 355 and ALT went from 15 and is now 134  -Check right upper quadrant ultrasound as well as an acute hepatitis panel in the a.m. -Continue monitor and trend hepatic function panel -Repeat CMP in a.m.  Acquired Hypothyroidism due to history of Hashimoto's disease -TSH was 56.769 -Obtained free T4 and was normal at 0.69 -Resume home levothyroxine at 150 mcg po Daily   Hypokalemia -Repleted orally seems 4.4 -DC isotonic bicarb which can also contribute -Continue to monitor and replete as necessary -Repeat BMP in the morning  SIRS with Leukocytosis and now Fever, possibly reactive in the setting of DKA -Urine negative for pyuria or protein but did show Rare Bacteria  0-5 RBC/HPF, 0-5 WBC -Blood culture negative to date. -No evidence of pneumonia on chest x-ray -Was on IV Ceftriaxone but stopped by ID -CK was 37, LDH was 154, CRP was 18.0, PCT was 0.57, LA was 1.8, and ESR was 53 -May need to reinitiate antibiotics but will continue to monitor and treat temperature -WBC went from 23.0 -> 13.9 -> 16.9 and is now resolved 0.9 -We will ask ID to weigh in further again; Continues to have low back pain at inudrated area so will obtain CT Scan to evaluate   Normocytic Anemia -Patient's Hgb/Hct went from 15.0/44.7 (Hemoconcentrated on Admission) and went to 11.4/32.6 and yesterday it was 12.2/35.5 today it is 11.6/33.9 -Check Anemia Panel in the AM iron level was 18, TIBC 165, TIBC was 183, saturation ratios were 10%, ferritin level was 458,  folate level was 12.3, and vitamin B12 level 2301 -Continue to Monitor for S/Sx of Bleeding; Currently no overt bleeding noted -Repeat CBC in the AM   Thrombocytosis -In the setting of Above -Platelet Count went from 534 -> 400 -> 425 and has now normalized and is 325 -Continue to Monitor and Trend -Repeat CBC in the AM  HypoNatremia (suspect PseudoHyponatremia) -Patient's Na+ is 134 and in the setting of Hyperglycemia -Continue to Monitor and Trend -Given 2 Liters of IVF Boluses yesterday -Repeat CMP in the AM  Metabolic Acidosis -Mild -Patient's CO2 i is now improved with a CO2 of 24, anion gap of 11, chloride level 99 -Continue to Monitor and Trend -Repeat CMP in the AM   Hyperbilirubinemia -Patient's T Bili was 2.9 (Indirect Bilirubin 2.4 and Direct was 0.5) and is now improved to 1.2\ -Obtaining right upper quadrant ultrasound -Continue to Monitor and Trend -Repeat CMP in AM  Hypophosphatemia -Patient's phosphorus level is 1.9 -Replete with p.o. K-Phos Neutral 500 mg twice daily x2 doses -Continue to monitor and replete as necessary -Repeat phosphorus level  DVT prophylaxis: Enoxaparin 40 mg sq q24h Code Status: FULL CODE  Family Communication: No family present at bedside  Disposition Plan: Pending further clinical improvement and improvement in Rash and Blood Sugars  Status is: Inpatient  Remains inpatient appropriate because:Unsafe d/c plan, IV treatments appropriate due to intensity of illness or inability to take PO and Inpatient level of care appropriate due to severity of illness   Dispo:  Patient From: Home  Planned Disposition: Home  Expected discharge date: 11/02/2020  Medically stable for discharge: No     Consultants:   Infectious Diseases    Procedures: None  Antimicrobials:  Anti-infectives (From admission, onward)   Start     Dose/Rate Route Frequency Ordered Stop   10/29/20 2000  vancomycin (VANCOREADY) IVPB 1500 mg/300 mL  Status:   Discontinued        1,500 mg 150 mL/hr over 120 Minutes Intravenous Every 24 hours 10/29/20 0203 10/29/20 1507   10/29/20 1600  cefTRIAXone (ROCEPHIN) 1 g in sodium chloride 0.9 % 100 mL IVPB  Status:  Discontinued        1 g 200 mL/hr over 30 Minutes Intravenous Every 24 hours 10/29/20 1507 10/30/20 1017   10/29/20 0015  ceFEPIme (MAXIPIME) 2 g in sodium chloride 0.9 % 100 mL IVPB  Status:  Discontinued        2 g 200 mL/hr over 30 Minutes Intravenous Every 8 hours 10/29/20 0004 10/29/20 1507   10/29/20 0015  vancomycin (VANCOCIN) IVPB 1000 mg/200 mL premix        1,000 mg 200 mL/hr over 60 Minutes Intravenous  Once 10/29/20 0004 10/29/20 0300   10/29/20 0000  metroNIDAZOLE (FLAGYL) IVPB 500 mg  Status:  Discontinued        500 mg 100 mL/hr over 60 Minutes Intravenous Every 8 hours 10/28/20 2346 10/29/20 1507        Subjective: Seen and examined and still complains of some back pain and thinks that his pain is still bad.  Does not itch as much now but rash is minimally the patient feels.  States that he did not sleep very well last night.  No nausea or vomiting.  No other concerns or complaints at this time..  Objective: Vitals:   10/31/20 0018 10/31/20 0541 10/31/20 1008 10/31/20 1324  BP: 128/65 (!) 134/93 124/77 135/73  Pulse: (!) 115 76 78 86  Resp: 19 15 20 20   Temp: 98.3 F (36.8 C) 97.8 F (36.6 C) 98.2 F (36.8 C) 98.2 F (36.8 C)  TempSrc: Axillary Oral Oral Oral  SpO2: 97% 94% 96% (!) 89%  Weight:      Height:        Intake/Output Summary (Last 24 hours) at 10/31/2020 1348 Last data filed at 10/31/2020 0000 Gross per 24 hour  Intake 240 ml  Output --  Net 240 ml   Filed Weights   10/28/20 2032  Weight: 70.3 kg   Examination: Physical Exam:  Constitutional: WN/WD slightly overweight Caucasian male in slight distress and appears uncomfortable again Eyes: Lids and conjunctivae normal, sclerae anicteric  ENMT: External Ears, Nose appear normal. Grossly  normal hearing. Neck: Appears normal, supple, no cervical masses, normal ROM, no appreciable thyromegaly; no appreciable JVD Respiratory: Clear to auscultation bilaterally, no wheezing, rales, rhonchi or crackles. Normal respiratory effort and patient is not tachypenic.  No accessory muscle use.  Cardiovascular: RRR, no murmurs / rubs / gallops. S1 and S2 auscultated.  Has some mild 1+ lower extremity edema Abdomen: Soft, non-tender, slightly distended. Bowel sounds positive.  GU: Deferred. Musculoskeletal: No clubbing / cyanosis of digits/nails. No joint deformity upper and lower extremities.  Skin: Diffuse whole-body rash with significant excoriation on the buttocks and the dorsal aspect of his legs.  Slightly indurated area in his lower back. Neurologic: CN 2-12 grossly intact with no focal deficits. Romberg sign cerebellar reflexes not assessed.  Psychiatric: Normal judgment and insight. Alert and oriented x 3. Normal mood and appropriate affect.   Data Reviewed: I have personally reviewed following labs and imaging studies  CBC: Recent Labs  Lab 10/28/20 1716 10/29/20 0303 10/30/20 0520 10/31/20 0458  WBC 23.0* 13.9* 16.9* 9.9  NEUTROABS  --   --   --  9.3*  HGB 15.0 11.4* 12.2* 11.6*  HCT 44.7 32.6* 35.5* 33.9*  MCV 91.2 87.6 88.5 89.4  PLT 534* 400 425* 774   Basic Metabolic Panel: Recent Labs  Lab 10/29/20 0303 10/29/20 0524 10/29/20 0617 10/30/20 0520 10/31/20 0458  NA 132* 133* 135 133* 134*  K 3.1* 3.2* 3.3* 3.7 4.4  CL 94* 101 103 97* 99  CO2 24 20* 24 21* 24  GLUCOSE 603* 242* 216* 364* 520*  BUN 15 15 15 10 11   CREATININE 1.04 1.09 1.06 1.19 1.15  CALCIUM 7.8* 8.6* 8.5* 8.7* 8.7*  MG 2.3  --   --   --  2.0  PHOS  --   --   --   --  1.9*   GFR: Estimated Creatinine Clearance: 90.2 mL/min (by C-G formula based on SCr of 1.15 mg/dL). Liver Function Tests: Recent Labs  Lab 10/28/20 2343 10/31/20 0458  AST 34 355*  ALT 15 174*  ALKPHOS 112 224*  BILITOT  2.9* 1.2  PROT 7.3 6.3*  ALBUMIN 3.6 3.0*   Recent Labs  Lab 10/28/20 2343  LIPASE 20   No results for input(s): AMMONIA in the last 168 hours. Coagulation Profile: No results for input(s): INR, PROTIME in the last 168 hours. Cardiac Enzymes: Recent Labs  Lab 10/29/20 0524  CKTOTAL 37*   BNP (last 3 results) No results for input(s): PROBNP in the last 8760 hours. HbA1C: Recent Labs    10/29/20 0303  HGBA1C 11.7*   CBG: Recent Labs  Lab 10/30/20 2050 10/31/20 0631 10/31/20 0750 10/31/20 1001 10/31/20 1139  GLUCAP 175* 464* 425* 367* 333*   Lipid Profile: No results for input(s): CHOL, HDL, LDLCALC, TRIG, CHOLHDL, LDLDIRECT in the last 72 hours. Thyroid Function Tests: Recent Labs    10/28/20 2014 10/29/20 1639  TSH 56.769*  --   FREET4  --  0.69   Anemia Panel: Recent Labs    10/31/20 0458  VITAMINB12 2,301*  FOLATE 12.4  FERRITIN 458*  TIBC 183*  IRON 18*  RETICCTPCT 1.7   Sepsis Labs: Recent Labs  Lab 10/28/20 2343 10/28/20 2348  PROCALCITON 0.57  --   LATICACIDVEN  --  1.8    Recent Results (from the past 240 hour(s))  Culture, blood (routine x 2)     Status: None (Preliminary result)   Collection Time: 10/28/20  8:30 PM   Specimen: BLOOD  Result Value Ref Range Status   Specimen Description   Final    BLOOD RIGHT WRIST Performed at [facility] [address], [place], [place] [address]    Special Requests  Final    BOTTLES DRAWN AEROBIC AND ANAEROBIC Blood Culture adequate volume Performed at [facility] [address], [place], [place] [address]    Culture   Final    NO GROWTH 3 DAYS Performed at [facility], [place] [address] [place], [place] [address]    Report Status PENDING  Incomplete  Culture, blood (routine x 2)     Status: None (Preliminary result)   Collection Time: 10/28/20  8:30 PM   Specimen: BLOOD RIGHT FOREARM  Result Value Ref Range Status   Specimen Description    Final    BLOOD RIGHT FOREARM Performed at [facility] [address] [place], [place] [address]    Special Requests   Final    BOTTLES DRAWN AEROBIC AND ANAEROBIC Blood Culture results may not be optimal due to an inadequate volume of blood received in culture bottles Performed at [facility] [address] [place], [place] [address]    Culture   Final    NO GROWTH 3 DAYS Performed at [facility], [place] [address] [place], [place] [address]    Report Status PENDING  Incomplete  SARS CORONAVIRUS 2 (TAT 6-24 HRS) Nasopharyngeal Nasopharyngeal Swab     Status: None   Collection Time: 10/29/20 12:21 AM   Specimen: Nasopharyngeal Swab  Result Value Ref Range Status   SARS Coronavirus 2 NEGATIVE NEGATIVE Final    Comment: (NOTE) SARS-CoV-2 target nucleic acids are NOT DETECTED.  The SARS-CoV-2 RNA is generally detectable in upper and lower respiratory specimens during the acute phase of infection. Negative results do not preclude SARS-CoV-2 infection, do not rule out co-infections with other pathogens, and should not be used as the sole basis for treatment or other patient management decisions. Negative results must be combined with clinical observations, patient history, and epidemiological information. The expected result is Negative.  Fact Sheet for Patients: SugarRoll.be  Fact Sheet for Healthcare Providers: https://www.woods-mathews.com/  This test is not yet approved or cleared by the [place] FDA and  has been authorized for detection and/or diagnosis of SARS-CoV-2 by FDA under an Emergency Use Authorization (EUA). This EUA will remain  in effect (meaning this test can be used) for the duration of the COVID-19 declaration under Se ction 564(b)(1) of the Act, 21 U.S.C. section 360bbb-3(b)(1), unless the authorization is terminated or revoked sooner.  Performed at [facility], [place] [address], [place], [place] [address]   MRSA PCR Screening     Status: None   Collection Time: 10/29/20  1:06 AM   Specimen: Nasopharyngeal  Result Value Ref Range Status   MRSA by PCR NEGATIVE NEGATIVE Final    Comment:        The GeneXpert MRSA Assay (FDA approved for NASAL specimens only), is one component of a comprehensive MRSA colonization surveillance program. It is not intended to diagnose MRSA infection nor to guide or monitor treatment for MRSA infections. Performed at [facility], [place] [address], [place], [place] [address]     RN Pressure Injury Documentation:   Estimated body mass index is 25.02 kg/m as calculated from the following:   Height as of this encounter: 5' 6"  (1.676 m).   Weight as of this encounter: 70.3 kg.  Malnutrition Type:  Nutrition Problem: Increased nutrient needs Etiology: acute illness,catabolic illness ([id number] infection)  Malnutrition Characteristics:  Signs/Symptoms: estimated needs   Nutrition Interventions:  Interventions: Glucerna shake,Prostat,MVI   Radiology Studies:  No results found. Scheduled Meds:  (feeding supplement) PROSource Plus  30 mL Oral BID BM   Chlorhexidine Gluconate Cloth  6 each Topical Daily   enoxaparin (LOVENOX) injection  40 mg Subcutaneous Q24H   feeding supplement (GLUCERNA SHAKE)  237 mL Oral BID BM   gabapentin  100 mg Oral BID   hydrocortisone cream   Topical BID   insulin aspart  0-5 Units Subcutaneous QHS   insulin aspart  0-9 Units Subcutaneous TID WC   insulin aspart  10 Units Subcutaneous TID WC   [START ON 11/01/2020] insulin glargine  30 Units Subcutaneous Daily   levothyroxine  150 mcg Oral Daily   lidocaine-EPINEPHrine  10 mL Infiltration Once   mouth rinse  15 mL Mouth Rinse BID   multivitamin with minerals  1 tablet Oral Daily   phosphorus  500 mg Oral BID   predniSONE  40 mg Oral Q breakfast   senna  1 tablet Oral QHS    Continuous Infusions:  sodium chloride 10 mL/hr at 10/29/20 1952    LOS: 2 days   Kerney Elbe, DO [facility] PAGER is on AMION  If 7PM-7AM, please contact night-coverage [email or web address]

## 2020-10-31 NOTE — Progress Notes (Signed)
Inpatient Diabetes Program Recommendations  AACE/ADA: New Consensus Statement on Inpatient Glycemic Control (2015)  Target Ranges:  Prepandial:   less than 140 mg/dL      Peak postprandial:   less than 180 mg/dL (1-2 hours)      Critically ill patients:  140 - 180 mg/dL   Lab Results  Component Value Date   GLUCAP 425 (H) 10/31/2020   HGBA1C 11.7 (H) 10/29/2020    Review of Glycemic Control  Diabetes history: DM1 Outpatient Diabetes medications: Lantus 30 QHS, CHO ratio 1:8, CF 20, Goal 100. Current orders for Inpatient glycemic control: Lantus 28 units QD, Novolog 0-9 units TID with meals and 0-5 HS + 10 units TID with meals  Inpatient Diabetes Program Recommendations:     Increase Lantus to 30 units QD  Pt states he needs assistance with obtaining PCP and having affordable insulin. Said he cannot afford his Lantus and Novolog.   Continue to follow.  Thank you. Ailene Ards, RD, LDN, CDE Inpatient Diabetes Coordinator [phone]

## 2020-10-31 NOTE — Progress Notes (Signed)
[facility]  Date of Admission:  10/28/2020           Reason for visit: Follow up on rash and concern for infection  Current antibiotics: None  Previous antibiotics: Ceftriaxone Vancomycin Cefepime Metronidazole  Principal Problem:   DKA, type 1 ([facility]) Active Problems:   Hypothyroidism (acquired)   Nausea & vomiting   Leukocytosis   Rash   ASSESSMENT:    Diffuse rash of unclear etiology: Not entirely convincing for an infectious etiology given the chronicity of his symptoms and areas of hyperkeratosis.  He does complain of lower back pain and has a small area of induration which may be a nidus of possible infection but there is no warmth, erythema, or drainage. Low grade fever x 1 Leukocytosis: normalized Poorly controlled type 1 DM Hashimoto's disease Elevated LFTs  PLAN:    Continue to monitor off antibiotics Agree with vasculitis work up and punch biopsy Follow up HIV and RPR Check hepatitis panel although unlikely Check RUQ [place] Check imaging of lower back to ensure no abscess or deeper infection Agree with steroids.  Will monitor for improvement Glycemic control per hospitalist  SUBJECTIVE:   24 hour events:  Given a dose of IV Solu-Medrol yesterday and today started on prednisone WBC normalized Blood cultures remain negative Low-grade temperature 100.4 AST and ALT elevated.  Previously normal on admission  glucose critically high at 520 Overnight with noted increasing edema in hands and feet as well as hives, nausea, increased itching  Complains of back pain.  Erythematous rash is worse and edema worse, too.   OBJECTIVE:   Blood pressure (!) 134/93, pulse 76, temperature 97.8 F (36.6 C), temperature source Oral, resp. rate 15, height 5\' 6"  (1.676 m), weight 70.3 kg, SpO2 94 %. Body mass index is 25.02 kg/m.  Physical Exam Constitutional:      General: He is not in acute distress.    Appearance: Normal appearance.   HENT:     Head: Normocephalic and atraumatic.  Pulmonary:     Effort: Pulmonary effort is normal. No respiratory distress.  Musculoskeletal:     Cervical back: Normal range of motion and neck supple.  Skin:    General: Skin is warm and dry.     Findings: Erythema, lesion and rash present.     Comments: Numerous skin lesions in various stages of healing with evidence of excoriation and thickened skin.  No significant purulence or drainage noted.  He also has a diffuse eczematous appearing rash on his upper and lower extremities that seems similar to yesterday.  Lower back with small area of induration without drainage or fluctuance.   Neurological:     General: No focal deficit present.     Mental Status: He is alert and oriented to person, place, and time.  Psychiatric:        Mood and Affect: Mood normal.        Behavior: Behavior normal.      Lab Results: Lab Results  Component Value Date   WBC 9.9 10/31/2020   HGB 11.6 (L) 10/31/2020   HCT 33.9 (L) 10/31/2020   MCV 89.4 10/31/2020   PLT 325 10/31/2020    Lab Results  Component Value Date   NA 134 (L) 10/31/2020   K 4.4 10/31/2020   CO2 24 10/31/2020   GLUCOSE 520 (HH) 10/31/2020   BUN 11 10/31/2020   CREATININE 1.15 10/31/2020   CALCIUM 8.7 (L) 10/31/2020   GFRNONAA >  60 10/31/2020   GFRAA >60 02/18/2017    Lab Results  Component Value Date   ALT 174 (H) 10/31/2020   AST 355 (H) 10/31/2020   ALKPHOS 224 (H) 10/31/2020   BILITOT 1.2 10/31/2020       Component Value Date/Time   CRP 18.0 (H) 10/28/2020 2350       Component Value Date/Time   ESRSEDRATE 53 (H) 10/28/2020 2343     I have reviewed the micro and lab results in Epic.  Imaging: No results found.   Imaging independently reviewed in Epic.    Vedia Coffer for Infectious Disease [facility] [phone] pager 10/31/2020, 8:35 AM  I spent greater than 35 minutes with the patient including greater than 50%  of time in face to face counsel of the patient and in coordination of their care.

## 2020-10-31 NOTE — Plan of Care (Signed)
  Problem: Clinical Measurements: Goal: Diagnostic test results will improve Outcome: Progressing   Problem: Clinical Measurements: Goal: Respiratory complications will improve Outcome: Progressing   Problem: Activity: Goal: Risk for activity intolerance will decrease Outcome: Progressing   

## 2020-10-31 NOTE — Progress Notes (Signed)
Pt had critical value serum glucose of 520. Pt asymptomatic. MD notified. New orders for 10 units humalog received and carried out. Reassessed, CBG now 464. MD notified via [facility] page.  No new orders ar this time.

## 2020-10-31 NOTE — Consult Note (Signed)
[facility] Admission Note  David Ramsey [birth year]  [id number].    Requesting MD: Heron Nay Chief Complaint: nausea and vomiting/diabetic sores/rash Reason for Consult: skin biopsy  HPI:  Patient is a 25 year old male type I diabetic who presented to the ED with complaints of nausea and vomiting, diabetic sores, he also reported not taking his insulin for a few months. He has been feeling bad for the past few days mostly sleeping.  Work-up in the ED shows patient is afebrile slightly tachycardic.  Labs shows a pH of 7.110, PCO2 of 17, PO2 of 50.5, bicarb 5.2.  Sodium 132, potassium 4.3, glucose 426, creatinine 1.49 Beta Hydroxybutyric acid >8.0, TSH 56, A1C 11.7, COVID negative. 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  ROS: Review of Systems  Constitutional: Negative.   HENT: Negative.   Eyes: Negative.   Respiratory: Negative.   Cardiovascular: Negative.   Gastrointestinal: Negative.   Genitourinary: Negative.   Musculoskeletal: Negative.   Skin:       Rash for about a week before he came in.  Prior hx of non healing ulcers  Neurological: Negative.   Endo/Heme/Allergies: Negative.   Psychiatric/Behavioral: Negative.     Family History  Problem Relation Age of Onset   Diabetes Cousin    Heart disease Other    Cancer Other    Thyroid disease Other    Healthy Mother     Past Medical History:  Diagnosis Date   Diabetes mellitus without complication (HCC)    INSULIN DEPENDENT   SINCE AGE 77   DKA (diabetic ketoacidoses) 02/17/2017   Hashimoto's disease    MRSA infection    Pneumonia    Liver contusion, lung effusion, sepsis multiorgan failure, no clear source    Past Surgical History:  Procedure Laterality Date   CHEST TUBE INSERTION      Social History:  reports that he quit smoking  about 3 years ago. His smoking use included cigarettes. He has a 2.00 pack-year smoking history. He has never used smokeless tobacco. He reports current alcohol use. He reports that he does not use drugs.  Lives with his wife and 47 y/o daughter  Allergies:  Allergies  Allergen Reactions   Ibuprofen Other (See Comments)    WANT TO AVOID LIVER FAILURE, SO PATIENT WAS TOLD NOT TO TAKE    Medications Prior to Admission  Medication Sig Dispense Refill   FLUoxetine (PROZAC) 20 MG capsule Take 20 mg by mouth daily.     gabapentin (NEURONTIN) 300 MG capsule Take 300 mg by mouth 3 (three) times daily.     insulin glargine (LANTUS) 100 UNIT/ML injection Inject 0.35 mLs (35 Units total) into the skin at bedtime. 10 mL 0   insulin lispro (HUMALOG) 100 UNIT/ML injection Inject 0.01-0.15 mLs (1-15 Units total) into the skin 3 (three) times daily before meals. Sliding scale 10 mL 0   levothyroxine (SYNTHROID, LEVOTHROID) 150 MCG tablet Take 150 mcg by mouth daily.     chlorhexidine (HIBICLENS) 4 % external liquid Apply topically daily as needed. Dilute 10-15 mL in water, Use daily when bathing for 1-2 weeks 120 mL 0    Blood pressure 124/77, pulse 78, temperature 98.2 F (36.8 C), temperature source Oral, resp. rate 20, height 5\' 6"  (1.676 m), weight 70.3 kg, SpO2 96 %. Physical Exam:  General: pleasant, WD, WN white male who  is laying in bed in NAD HEENT: head is normocephalic, atraumatic.  Sclera are noninjected.  Pupils are equal.  Ears and nose without any masses or lesions.  Mouth is pink and moist Heart: regular, rate, and rhythm.  Normal s1,s2. No obvious murmurs, gallops, or rubs noted.  Palpable radial and pedal pulses bilaterally Lungs: CTAB, no wheezes, rhonchi, or rales noted.  Respiratory effort nonlabored Abd: soft, NT, ND, +BS, no masses, hernias, or organomegaly MS: all 4 extremities are symmetrical with no cyanosis, clubbing, or edema. Skin: erythematous raised rash present on  chest, abdomen, back both arms and hands, both legs, buttocks to feet both sides. He also has ulcers measuring from 1 cm to 3 cm in diameter, buttocks, thighs and lower legs.  The larger ulcers are raised and have skin loss in the mid portion of the ulcer.  Epic timing out and would not allow me to take pictures.  There are some from 06/06/20 that show some of the ulcers, and some of the erythema, although erythema is more diffuse now. Neuro: Cranial nerves 2-12 grossly intact, sensation is normal throughout Psych: A&Ox3 with an appropriate affect.   Results for orders placed or performed during the hospital encounter of 10/28/20 (from the past 48 hour(s))  Beta-hydroxybutyric acid     Status: Abnormal   Collection Time: 10/29/20 11:53 AM  Result Value Ref Range   Beta-Hydroxybutyric Acid 3.81 (H) 0.05 - 0.27 mmol/L    Comment: Performed at [facility], [address] [place], [place] [address]  Glucose, capillary     Status: Abnormal   Collection Time: 10/29/20 12:02 PM  Result Value Ref Range   Glucose-Capillary 155 (H) 70 - 99 mg/dL    Comment: Glucose reference range applies only to samples taken after fasting for at least 8 hours.   Comment 1 Notify RN    Comment 2 Document in Chart   T4, free     Status: None   Collection Time: 10/29/20  4:39 PM  Result Value Ref Range   Free T4 0.69 0.61 - 1.12 ng/dL    Comment: (NOTE) Biotin ingestion may interfere with free T4 tests. If the results are inconsistent with the TSH level, previous test results, or the clinical presentation, then consider biotin interference. If needed, order repeat testing after stopping biotin. Performed at [facility], [address], [place], [place] [address]   Glucose, capillary     Status: Abnormal   Collection Time: 10/29/20  4:43 PM  Result Value Ref Range   Glucose-Capillary 294 (H) 70 - 99 mg/dL    Comment: Glucose reference range applies only to samples taken after fasting  for at least 8 hours.   Comment 1 Notify RN    Comment 2 Document in Chart   Glucose, capillary     Status: Abnormal   Collection Time: 10/29/20  8:54 PM  Result Value Ref Range   Glucose-Capillary 276 (H) 70 - 99 mg/dL    Comment: Glucose reference range applies only to samples taken after fasting for at least 8 hours.  Glucose, capillary     Status: Abnormal   Collection Time: 10/30/20  3:02 AM  Result Value Ref Range   Glucose-Capillary 305 (H) 70 - 99 mg/dL    Comment: Glucose reference range applies only to samples taken after fasting for at least 8 hours.  CBC     Status: Abnormal   Collection Time: 10/30/20  5:20 AM  Result Value Ref  Range   WBC 16.9 (H) 4.0 - 10.5 K/uL   RBC 4.01 (L) 4.22 - 5.81 MIL/uL   Hemoglobin 12.2 (L) 13.0 - 17.0 g/dL   HCT 16.1 (L) 09.6 - 04.5 %   MCV 88.5 80.0 - 100.0 fL   MCH 30.4 26.0 - 34.0 pg   MCHC 34.4 30.0 - 36.0 g/dL   RDW 40.9 81.1 - 91.4 %   Platelets 425 (H) 150 - 400 K/uL   nRBC 0.0 0.0 - 0.2 %    Comment: Performed at [facility], [address] [place], [place] [address]  Basic metabolic panel     Status: Abnormal   Collection Time: 10/30/20  5:20 AM  Result Value Ref Range   Sodium 133 (L) 135 - 145 mmol/L   Potassium 3.7 3.5 - 5.1 mmol/L   Chloride 97 (L) 98 - 111 mmol/L   CO2 21 (L) 22 - 32 mmol/L   Glucose, Bld 364 (H) 70 - 99 mg/dL    Comment: Glucose reference range applies only to samples taken after fasting for at least 8 hours.   BUN 10 6 - 20 mg/dL   Creatinine, Ser 6.21 0.61 - 1.24 mg/dL   Calcium 8.7 (L) 8.9 - 10.3 mg/dL   GFR, Estimated >30 >86 mL/min    Comment: (NOTE) Calculated using the CKD-EPI Creatinine Equation (2021)    Anion gap 15 5 - 15    Comment: Performed at [facility], [address], [place], [place] [address]  Glucose, capillary     Status: Abnormal   Collection Time: 10/30/20  7:29 AM  Result Value Ref Range   Glucose-Capillary 352 (H) 70 - 99 mg/dL     Comment: Glucose reference range applies only to samples taken after fasting for at least 8 hours.  Glucose, capillary     Status: Abnormal   Collection Time: 10/30/20 11:24 AM  Result Value Ref Range   Glucose-Capillary 269 (H) 70 - 99 mg/dL    Comment: Glucose reference range applies only to samples taken after fasting for at least 8 hours.  Glucose, capillary     Status: Abnormal   Collection Time: 10/30/20  4:21 PM  Result Value Ref Range   Glucose-Capillary 267 (H) 70 - 99 mg/dL    Comment: Glucose reference range applies only to samples taken after fasting for at least 8 hours.  Glucose, capillary     Status: Abnormal   Collection Time: 10/30/20  8:50 PM  Result Value Ref Range   Glucose-Capillary 175 (H) 70 - 99 mg/dL    Comment: Glucose reference range applies only to samples taken after fasting for at least 8 hours.  CBC with Differential/Platelet     Status: Abnormal   Collection Time: 10/31/20  4:58 AM  Result Value Ref Range   WBC 9.9 4.0 - 10.5 K/uL   RBC 3.79 (L) 4.22 - 5.81 MIL/uL   Hemoglobin 11.6 (L) 13.0 - 17.0 g/dL   HCT 96.2 (L) 95.2 - 84.1 %   MCV 89.4 80.0 - 100.0 fL   MCH 30.6 26.0 - 34.0 pg   MCHC 34.2 30.0 - 36.0 g/dL   RDW 32.4 40.1 - 02.7 %   Platelets 325 150 - 400 K/uL   nRBC 0.0 0.0 - 0.2 %   Neutrophils Relative % 94 %   Neutro Abs 9.3 (H) 1.7 - 7.7 K/uL   Lymphocytes Relative 3 %   Lymphs Abs 0.3 (L) 0.7 - 4.0 K/uL  Monocytes Relative 2 %   Monocytes Absolute 0.2 0.1 - 1.0 K/uL   Eosinophils Relative 0 %   Eosinophils Absolute 0.0 0.0 - 0.5 K/uL   Basophils Relative 0 %   Basophils Absolute 0.0 0.0 - 0.1 K/uL   Immature Granulocytes 1 %   Abs Immature Granulocytes 0.05 0.00 - 0.07 K/uL    Comment: Performed at [facility], [address] [place], [place] [address]  Comprehensive metabolic panel     Status: Abnormal   Collection Time: 10/31/20  4:58 AM  Result Value Ref Range   Sodium 134 (L) 135 - 145 mmol/L    Potassium 4.4 3.5 - 5.1 mmol/L   Chloride 99 98 - 111 mmol/L   CO2 24 22 - 32 mmol/L   Glucose, Bld 520 (HH) 70 - 99 mg/dL    Comment: Glucose reference range applies only to samples taken after fasting for at least 8 hours. CRITICAL RESULT CALLED TO, READ BACK BY AND VERIFIED WITH: LOGAN T. 02.24.22 @ 0552 BY MECIAL J.    BUN 11 6 - 20 mg/dL   Creatinine, Ser 8.111.15 0.61 - 1.24 mg/dL   Calcium 8.7 (L) 8.9 - 10.3 mg/dL   Total Protein 6.3 (L) 6.5 - 8.1 g/dL   Albumin 3.0 (L) 3.5 - 5.0 g/dL   AST 914355 (H) 15 - 41 U/L   ALT 174 (H) 0 - 44 U/L   Alkaline Phosphatase 224 (H) 38 - 126 U/L   Total Bilirubin 1.2 0.3 - 1.2 mg/dL   GFR, Estimated >78>60 >29>60 mL/min    Comment: (NOTE) Calculated using the CKD-EPI Creatinine Equation (2021)    Anion gap 11 5 - 15    Comment: Performed at [facility], [address] [place], [place] [address]  Phosphorus     Status: Abnormal   Collection Time: 10/31/20  4:58 AM  Result Value Ref Range   Phosphorus 1.9 (L) 2.5 - 4.6 mg/dL    Comment: Performed at [facility], [address] [place], [place] [address]  Magnesium     Status: None   Collection Time: 10/31/20  4:58 AM  Result Value Ref Range   Magnesium 2.0 1.7 - 2.4 mg/dL    Comment: Performed at [facility], [address] [place], [place] [address]  Vitamin B12     Status: Abnormal   Collection Time: 10/31/20  4:58 AM  Result Value Ref Range   Vitamin B-12 2,301 (H) 180 - 914 pg/mL    Comment: RESULTS CONFIRMED BY MANUAL DILUTION (NOTE) This assay is not validated for testing neonatal or myeloproliferative syndrome specimens for Vitamin B12 levels. Performed at [facility], [address] [place], [place] [address]   Folate     Status: None   Collection Time: 10/31/20  4:58 AM  Result Value Ref Range   Folate 12.4 >5.9 ng/mL    Comment: Performed at [facility], [address]  [place], [place] [address]  Iron and TIBC     Status: Abnormal   Collection Time: 10/31/20  4:58 AM  Result Value Ref Range   Iron 18 (L) 45 - 182 ug/dL   TIBC 401183 (L) 027250 - 253450 ug/dL   Saturation Ratios 10 (L) 17.9 - 39.5 %   UIBC 165 ug/dL    Comment: Performed at [facility], [address] [place], [place] [address]  Ferritin     Status: Abnormal  Collection Time: 10/31/20  4:58 AM  Result Value Ref Range   Ferritin 458 (H) 24 - 336 ng/mL    Comment: Performed at [facility], [address] [place], [place] [address]  Reticulocytes     Status: Abnormal   Collection Time: 10/31/20  4:58 AM  Result Value Ref Range   Retic Ct Pct 1.7 0.4 - 3.1 %   RBC. 3.75 (L) 4.22 - 5.81 MIL/uL   Retic Count, Absolute 61.9 19.0 - 186.0 K/uL   Immature Retic Fract 22.5 (H) 2.3 - 15.9 %    Comment: Performed at [facility], [address], [place], [place] [address]  Glucose, capillary     Status: Abnormal   Collection Time: 10/31/20  6:31 AM  Result Value Ref Range   Glucose-Capillary 464 (H) 70 - 99 mg/dL    Comment: Glucose reference range applies only to samples taken after fasting for at least 8 hours.  Glucose, capillary     Status: Abnormal   Collection Time: 10/31/20  7:50 AM  Result Value Ref Range   Glucose-Capillary 425 (H) 70 - 99 mg/dL    Comment: Glucose reference range applies only to samples taken after fasting for at least 8 hours.  Glucose, capillary     Status: Abnormal   Collection Time: 10/31/20 10:01 AM  Result Value Ref Range   Glucose-Capillary 367 (H) 70 - 99 mg/dL    Comment: Glucose reference range applies only to samples taken after fasting for at least 8 hours.   No results found.   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   Procedure:  A site on the right thigh with rash, but no open ulcers was chosen.  Area was cleaned with betadine swabs x 3.  4 ml of 1% lidocaine with epinephrine was infiltrated into the biopsy site. When anesthesia was confirmed, a 3 MM skin punch was used to obtain the biopsy.  It was place in [place] with pt label in place.  Direct pressure was used to control bleeding.  Antibiotic ointment was placed over the site with a dry pressure dressing using paper tape, He is allergic to other adhesive tapes.   Pt tolerated the procedure well and had no problems with the procedure.   We can remove dressing and cover with bandaide tomorrow.     Sherrie George [facility] 10/31/2020, 11:16 AM Please see Amion for pager number during day hours 7:00am-4:30pm

## 2020-11-01 ENCOUNTER — Inpatient Hospital Stay (HOSPITAL_COMMUNITY): Payer: BC Managed Care – PPO

## 2020-11-01 DIAGNOSIS — D72829 Elevated white blood cell count, unspecified: Secondary | ICD-10-CM | POA: Diagnosis not present

## 2020-11-01 DIAGNOSIS — E1010 Type 1 diabetes mellitus with ketoacidosis without coma: Secondary | ICD-10-CM | POA: Diagnosis not present

## 2020-11-01 DIAGNOSIS — J9601 Acute respiratory failure with hypoxia: Secondary | ICD-10-CM

## 2020-11-01 DIAGNOSIS — E039 Hypothyroidism, unspecified: Secondary | ICD-10-CM | POA: Diagnosis not present

## 2020-11-01 DIAGNOSIS — R945 Abnormal results of liver function studies: Secondary | ICD-10-CM

## 2020-11-01 DIAGNOSIS — J189 Pneumonia, unspecified organism: Secondary | ICD-10-CM

## 2020-11-01 DIAGNOSIS — R21 Rash and other nonspecific skin eruption: Secondary | ICD-10-CM | POA: Diagnosis not present

## 2020-11-01 DIAGNOSIS — M545 Low back pain, unspecified: Secondary | ICD-10-CM

## 2020-11-01 LAB — COMPREHENSIVE METABOLIC PANEL
ALT: 150 U/L — ABNORMAL HIGH (ref 0–44)
AST: 145 U/L — ABNORMAL HIGH (ref 15–41)
Albumin: 2.6 g/dL — ABNORMAL LOW (ref 3.5–5.0)
Alkaline Phosphatase: 198 U/L — ABNORMAL HIGH (ref 38–126)
Anion gap: 9 (ref 5–15)
BUN: 17 mg/dL (ref 6–20)
CO2: 29 mmol/L (ref 22–32)
Calcium: 8.7 mg/dL — ABNORMAL LOW (ref 8.9–10.3)
Chloride: 100 mmol/L (ref 98–111)
Creatinine, Ser: 0.92 mg/dL (ref 0.61–1.24)
GFR, Estimated: >60 mL/min (ref >60)
Glucose, Bld: 386 mg/dL — ABNORMAL HIGH (ref 70–99)
Potassium: 3.6 mmol/L (ref 3.5–5.1)
Sodium: 138 mmol/L (ref 135–145)
Total Bilirubin: 0.6 mg/dL (ref 0.3–1.2)
Total Protein: 6 g/dL — ABNORMAL LOW (ref 6.5–8.1)

## 2020-11-01 LAB — CBC WITH DIFFERENTIAL/PLATELET
Abs Immature Granulocytes: 0.25 10*3/uL — ABNORMAL HIGH (ref 0.00–0.07)
Basophils Absolute: 0.1 10*3/uL (ref 0.0–0.1)
Basophils Relative: 1 %
Eosinophils Absolute: 0.5 10*3/uL (ref 0.0–0.5)
Eosinophils Relative: 4 %
HCT: 31.7 % — ABNORMAL LOW (ref 39.0–52.0)
Hemoglobin: 11 g/dL — ABNORMAL LOW (ref 13.0–17.0)
Immature Granulocytes: 2 %
Lymphocytes Relative: 8 %
Lymphs Abs: 1.1 10*3/uL (ref 0.7–4.0)
MCH: 30.6 pg (ref 26.0–34.0)
MCHC: 34.7 g/dL (ref 30.0–36.0)
MCV: 88.3 fL (ref 80.0–100.0)
Monocytes Absolute: 1 10*3/uL (ref 0.1–1.0)
Monocytes Relative: 7 %
Neutro Abs: 10.6 10*3/uL — ABNORMAL HIGH (ref 1.7–7.7)
Neutrophils Relative %: 78 %
Platelets: 379 10*3/uL (ref 150–400)
RBC: 3.59 MIL/uL — ABNORMAL LOW (ref 4.22–5.81)
RDW: 12.4 % (ref 11.5–15.5)
WBC: 13.4 10*3/uL — ABNORMAL HIGH (ref 4.0–10.5)
nRBC: 0.1 % (ref 0.0–0.2)

## 2020-11-01 LAB — RESPIRATORY PANEL BY PCR

## 2020-11-01 LAB — RESP PANEL BY RT-PCR (RSV, FLU A&B, COVID)  RVPGX2
Influenza A by PCR: NEGATIVE
Influenza B by PCR: NEGATIVE
Resp Syncytial Virus by PCR: NEGATIVE
SARS Coronavirus 2 by RT PCR: NEGATIVE

## 2020-11-01 LAB — PROCALCITONIN: Procalcitonin: 0.28 ng/mL

## 2020-11-01 LAB — GLUCOSE, CAPILLARY
Glucose-Capillary: 363 mg/dL — ABNORMAL HIGH (ref 70–99)
Glucose-Capillary: 302 mg/dL — ABNORMAL HIGH (ref 70–99)
Glucose-Capillary: 270 mg/dL — ABNORMAL HIGH (ref 70–99)
Glucose-Capillary: 276 mg/dL — ABNORMAL HIGH (ref 70–99)

## 2020-11-01 LAB — HEPATITIS PANEL, ACUTE
HCV Ab: NONREACTIVE
Hep A IgM: NONREACTIVE
Hep B C IgM: NONREACTIVE
Hepatitis B Surface Ag: NONREACTIVE

## 2020-11-01 LAB — LACTIC ACID, PLASMA
Lactic Acid, Venous: 2.6 mmol/L (ref 0.5–1.9)
Lactic Acid, Venous: 1.6 mmol/L (ref 0.5–1.9)

## 2020-11-01 LAB — MAGNESIUM: Magnesium: 2.2 mg/dL (ref 1.7–2.4)

## 2020-11-01 LAB — ANA W/REFLEX IF POSITIVE: Anti Nuclear Antibody (ANA): NEGATIVE

## 2020-11-01 LAB — PHOSPHORUS: Phosphorus: 2.7 mg/dL (ref 2.5–4.6)

## 2020-11-01 LAB — ANCA TITERS
Atypical P-ANCA titer: <1:20 {titer}
C-ANCA: <1:20 {titer}
P-ANCA: <1:20 {titer}

## 2020-11-01 MED ORDER — IPRATROPIUM BROMIDE 0.02 % IN SOLN: 0.5000 mg | Freq: Four times a day (QID) | RESPIRATORY_TRACT | Status: DC | PRN

## 2020-11-01 MED ORDER — GUAIFENESIN ER 600 MG PO TB12
1200.0000 mg | ORAL_TABLET | Freq: Two times a day (BID) | ORAL | Status: DC
  Administered 2020-11-01 – 2020-11-03 (×5): 1200 mg via ORAL
  Filled 2020-11-01 (×5): qty 2

## 2020-11-01 MED ORDER — INSULIN ASPART 100 UNIT/ML ~~LOC~~ SOLN
12.0000 [IU] | Freq: Three times a day (TID) | SUBCUTANEOUS | Status: DC
  Administered 2020-11-01 – 2020-11-03 (×6): 12 [IU] via SUBCUTANEOUS

## 2020-11-01 MED ORDER — LEVALBUTEROL HCL 0.63 MG/3ML IN NEBU
0.6300 mg | INHALATION_SOLUTION | Freq: Four times a day (QID) | RESPIRATORY_TRACT | Status: DC
  Administered 2020-11-01: 0.63 mg via RESPIRATORY_TRACT
  Filled 2020-11-01: qty 3

## 2020-11-01 MED ORDER — SODIUM CHLORIDE 0.9 % IV SOLN: INTRAVENOUS | Status: DC

## 2020-11-01 MED ORDER — SODIUM CHLORIDE 0.9 % IV BOLUS
1000.0000 mL | Freq: Once | INTRAVENOUS | Status: AC
  Administered 2020-11-01: 1000 mL via INTRAVENOUS

## 2020-11-01 MED ORDER — INSULIN GLARGINE 100 UNIT/ML ~~LOC~~ SOLN
35.0000 [IU] | Freq: Every day | SUBCUTANEOUS | Status: DC
  Administered 2020-11-02: 35 [IU] via SUBCUTANEOUS
  Filled 2020-11-01: qty 0.35

## 2020-11-01 MED ORDER — IPRATROPIUM BROMIDE 0.02 % IN SOLN
0.5000 mg | Freq: Four times a day (QID) | RESPIRATORY_TRACT | Status: DC
  Administered 2020-11-01: 0.5 mg via RESPIRATORY_TRACT
  Filled 2020-11-01: qty 2.5

## 2020-11-01 MED ORDER — LEVALBUTEROL HCL 0.63 MG/3ML IN NEBU: 0.6300 mg | INHALATION_SOLUTION | Freq: Four times a day (QID) | RESPIRATORY_TRACT | Status: DC | PRN

## 2020-11-01 NOTE — Progress Notes (Signed)
    CC: Rash  Subjective: Complaining primarily of back pain.  Rash is stable.  Biopsy site looks fine.  Dressing is off, site is clean.  He does not want another dressing on it.  Objective: Vital signs in last 24 hours: Temp:  [97.9 F (36.6 C)-98.2 F (36.8 C)] 97.9 F (36.6 C) (02/25 0614) Pulse Rate:  [74-92] 74 (02/25 0614) Resp:  [18-20] 20 (02/25 0614) BP: (124-154)/(73-94) 154/94 (02/25 0614) SpO2:  [88 %-96 %] 90 % (02/25 0614) Weight:  [78 kg] 78 kg (02/25 0614) Last BM Date: 10/31/20 Afebrile, vital signs are stable Glucose 386, AST 145, ALT 150, WBC 13.4 H/H 11/31.7   Intake/Output from previous day: 02/24 0701 - 02/25 0700 In: 112.3 [I.V.:112.3] Out: -  Intake/Output this shift: No intake/output data recorded.  General appearance: alert, cooperative, no distress and Complaining of back pain. Skin: Rash stable, biopsy site looks fine.  Lab Results:  Recent Labs    10/31/20 0458 11/01/20 0504  WBC 9.9 13.4*  HGB 11.6* 11.0*  HCT 33.9* 31.7*  PLT 325 379    BMET Recent Labs    10/31/20 0458 11/01/20 0504  NA 134* 138  K 4.4 3.6  CL 99 100  CO2 24 29  GLUCOSE 520* 386*  BUN 11 17  CREATININE 1.15 0.92  CALCIUM 8.7* 8.7*   PT/INR No results for input(s): LABPROT, INR in the last 72 hours.  Recent Labs  Lab 10/28/20 2343 10/31/20 0458 11/01/20 0504  AST 34 355* 145*  ALT 15 174* 150*  ALKPHOS 112 224* 198*  BILITOT 2.9* 1.2 0.6  PROT 7.3 6.3* 6.0*  ALBUMIN 3.6 3.0* 2.6*     Lipase     Component Value Date/Time   LIPASE 20 10/28/2020 2343     Medications: . (feeding supplement) PROSource Plus  30 mL Oral BID BM  . enoxaparin (LOVENOX) injection  40 mg Subcutaneous Q24H  . feeding supplement (GLUCERNA SHAKE)  237 mL Oral BID BM  . gabapentin  100 mg Oral BID  . insulin aspart  0-5 Units Subcutaneous QHS  . insulin aspart  0-9 Units Subcutaneous TID WC  . insulin aspart  10 Units Subcutaneous TID WC  . insulin glargine   30 Units Subcutaneous Daily  . levothyroxine  150 mcg Oral Daily  . mouth rinse  15 mL Mouth Rinse BID  . multivitamin with minerals  1 tablet Oral Daily  . mupirocin cream   Topical BID  . predniSONE  40 mg Oral Q breakfast  . senna  1 tablet Oral QHS  . triamcinolone   Topical BID    Assessment/Plan Sepsis with fever up to 103  -WBC  23.0>>13.9>> 16.9>> 9.9 Severe DKA Type 1 diabetes -off insulin -A1c 11.7  -Glucose 520>>367>>333  Hashimoto's disease with hypothyroidism  -TSH 56.7 Back pain -CT spine 2/24 soft tissue inflammatory changes S1, no evidence of discrete drainable abscess/osteomyelitis, mild bulging L2-3 towards the left no apparent neural compression Hx MRSA Hx tobacco use  Rash of uncertain etiology 3 mm skin punch biopsy 10/31/2020  FEN: Carb modified ID: [email or web address] DVT: Lovenox   Plan: Biopsy site looks fine.  Please call if we can be of further assistance.      LOS: 3 days    JENNINGS,WILLARD 11/01/2020 Please see Amion

## 2020-11-01 NOTE — Progress Notes (Signed)
PROGRESS NOTE    David Ramsey  HQI:[id number] DOB: 02-05-1996 DOA: 10/28/2020 PCP: Patient, No Pcp Per   Brief Narrative:  HPI per Dr. Shela Leff on 10/28/20 David Ramsey is a 25 y.o. 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  Patient reported not taking his insulin for the past few months.  In the ED, afebrile.  Tachycardic and tachypneic.  Not hypotensive or hypoxic.  Labs showing WBC 23.0, hemoglobin 15.0, platelet count 534K. Sodium 132, potassium 4.3, chloride 97, bicarb 7, anion gap 28, BUN 18, creatinine 1.4, glucose 426.  UA with ketones.  Beta hydroxybutyric acid level pending.  VBG with pH 7.1, PCO2 17.  TSH pending.  Blood culture x2 pending.  Chest x-ray negative for acute cardiopulmonary abnormality.  PCCM consulted and felt that the patient did not need ICU admission.  Patient states he is having an "infection" all over his body and is itching.  Reports noticing skin lesions on his legs initially about 2 months ago which are now all over.  States he was seen at urgent care for this problem and prescribed antibiotics which did not help.  For the past 2 days he is having fevers as high as 103 F.  He has been vomiting since yesterday.  Denies abdominal pain or diarrhea.  States he is a type I diabetic and has been on insulin since the age of 71.  He was previously on Lantus and Humalog but has not been able to take them for the past 3 months due to not being able to afford them.  States he has not been able to make any appointments with his doctor and has not heard back from their office.  States he bought Novolin 70/30 from [place] but is not using it regularly.  He is not vaccinated against COVID.  Denies headaches, cough, or shortness of breath.  **Interim History Insulin is being adjusted as he continues to have some  hyperglycemia.  ID was consulted given concern for infectious etiology of his rash and excoriations.  Antibiotics were stopped and he did not spiked a temperature.  May need to reinitiate antibiotics but will hold off for now.  Rash was getting worse so we will give a dose of Solu-Medrol 125 mg x 1 and this was done on 10/30/20 and we started prednisone 40 mill daily.  We will work the patient up for vasculitis and check ANCA and ANA as well as check a hepatitis panel given his abnormal LFTs.  General surgery was consulted for punch biopsy which results are still pending.  LFTs are trending down now and liver ultrasound done and was within normal limits.  Overnight the patient became hypoxic desaturated to the mid 80s on room air.  Chest x-ray shows a atypical pneumonia so we will retest for Covid and check a respiratory virus panel.  We will hold off on antibiotics currently given that he is afebrile now.  White blood cell count did go up but in the setting of steroid demargination.  We have added breathing treatments as well as guaifenesin and Claritin valve and incentive spirometry.  If necessary and he if he worsens will start Augmentin for antibiotics.  Assessment & Plan:   Principal Problem:   DKA, type 1 ([place]) Active Problems:   Hypothyroidism (acquired)   Nausea & vomiting   Leukocytosis   Rash  Severe DKA type I, secondary to medication noncompliance -Presented with serum glucose 426, serum bicarb 7, anion gap 28, ketonuria, beta hydroxybutyrate acid greater than 8.0, VBG pH 7.1. -Completed DKA protocol and transitioned to subcu insulin -DC isotonic bicarb drip and D5 LR -IV fluid hydration is now stopped. -Hemoglobin A1c 11.7 on 10/29/20 -Increased Lantus to 30 units units and added 10 units Novolog sq TIDwm in addition to SSI and now increase the 10 units 3 times daily with meals to 12 units and if he continues to have fasting blood sugars greater than 180 tomorrow we will go to 34  units of Lantus -CBGs ranging from 302-367 -Diabetes Education Coordinator consulted and appreciate further evaluation and recommendations and following closely -If his blood sugars remain elevated will make further adjustments  Diffuse Rash with Excoriation, unclear etiology.  Slightly improving now -Was started on broad-spectrum IV antibiotics on admission and now stopped by ID -MRSA Negative on 10/29/2020 -DC IV vancomycin, cefepime and Flagyl -IV Rocephin has not been stopped by infectious diseases to -CK was 37, LDH was 154, CRP was 18.0, PCT was 0.57, LA was 1.8, and ESR was 53 -C/w IV Benadryl 25 mg q8h, -Will give IV Solumedrol 125 mg x1 and start Prednisone 40 mg po Daily and this was done -Will ask General Surgery to come by in the AM and do a Punch Biopsy  -Check UDS -We will check ANCA which is negative so far and ANA testing which was negative along with syphilisand RPR which was negative as well as HIV (Negative)  -He has an area of induration on his lower back so we will obtain a CT scan to evaluate for infection -We will add mupirocin to open excoriated areas twice daily and stop hydrocortisone cream and start triamcinolone 0.1% cream -Follow-up on biopsy results by surgery And also added hydroxyzine 25 mg p.o. 3 times daily as needed itching -We will need a dermatology evaluation at discharge  Abnormal LFTs, improving -His AST went from 34 and trended up to 355 and ALT went from 15 and is now 134; now AST and ALT have improved -Acute hepatitis panel was negative and right upper quadrant ultrasound was within normal limits -Continue monitor and trend hepatic function panel -Repeat CMP in a.m.  Acquired Hypothyroidism due to history of Hashimoto's disease -TSH was 56.769 -Obtained free T4 and was normal at 0.69 -Resume home levothyroxine at 150 mcg po Daily   Low back pain -Has a pain where his induration is -Lower back CT scan done and showed "Possible soft tissue  inflammatory change posterior to the S1 level within the subcutaneous fat. No evidence of discrete drainable abscess. No evidence of osteomyelitis. 2. Mild bulging of the disc at L2-3 more towards the left. No apparent neural compression." -Will add K Pad    Hypokalemia -Potassium is stable at 3.6 -DC isotonic bicarb which can also contribute -Continue to monitor and replete as necessary -Repeat BMP in the morning  Atypical pneumonia  Acute respiratory failure with hypoxia in the setting of the atypical pneumonia -Urine negative for pyuria or protein but did show Rare Bacteria  0-5 RBC/HPF, 0-5 WBC -Blood culture negative to date. -No evidence of pneumonia on chest x-ray initially but repeat chest x-ray today showed "Areas of airspace opacity bilaterally, most notable in the left base. No appreciable consolidation. Question atypical organism pneumonia. Correlation with COVID-19 status advised in this regard. Heart upper normal in size. No adenopathy appreciable"  -Procalcitonin has actually trended down  from 0.57 is now 0.28 -Lactic acid level was slightly elevated 2.6 and is now 1.6 -Given another 1 L bolus today -WBC had trended down and now trending back up in the setting of steroid demargination -Was on IV Ceftriaxone but stopped by ID -CK was 37, LDH was 154, CRP was 18.0, PCT was 0.57, LA was 1.8, and ESR was 53 -May need to reinitiate antibiotics but will continue to monitor and treat temperature but he has been afebrile -WBC went from 23.0 -> 13.9 -> 16.9 and is now resolved 9.9 and now slightly worse again to 13.4 in the setting of steroids -Fenesin 400 mg p.o. twice daily, flutter valve, incentive spirometry as well as DuoNebs -Holding off on antibiotics for now; ID evaluated and feel that his presentation is not consistent with bacterial pneumonia and would favor holding off antibiotics but if it is felt that needs to be treated would be reasonable to provide a short course of  Augmentin at discharge -Repeating Covid testing and respiratory virus panel  Normocytic Anemia -Patient's Hgb/Hct went from 15.0/44.7 (Hemoconcentrated on Admission) and went to 11.4/32.6 and yesterday it was 12.2/35.5 today it is 11.6/33.9 -Check Anemia Panel in the AM iron level was 18, TIBC 165, TIBC was 183, saturation ratios were 10%, ferritin level was 458, folate level was 12.3, and vitamin B12 level 2301 -Continue to Monitor for S/Sx of Bleeding; Currently no overt bleeding noted -Repeat CBC in the AM   Thrombocytosis -In the setting of Above -Platelet Count went from 534 -> 400 -> 425 and has now normalized and is 325 yesterday and today is 379 -Continue to Monitor and Trend -Repeat CBC in the AM  HypoNatremia (suspect PseudoHyponatremia) -Patient's Na+ is 138 and improved -Continue to Monitor and Trend -Given 2 Liters of IVF Boluses the day before yesterday and today will be given 1 L bolus -Repeat CMP in the AM  Metabolic Acidosis -Mild and resolved -Patient's CO2 i is now improved with a CO2 of 29, anion gap of nine, chloride level 100 -Continue to Monitor and Trend -Repeat CMP in the AM   Hyperbilirubinemia -Patient's T Bili was 2.9 (Indirect Bilirubin 2.4 and Direct was 0.5) and is now improved and normalized to 0.6 -Obtaining right upper quadrant ultrasound and was normal as above -Continue to Monitor and Trend -Repeat CMP in AM   Hypophosphatemia -Patient's phosphorus level is 1.9 and improved to 2.7 -Continue to monitor and replete as necessary -Repeat phosphorus level  DVT prophylaxis: Enoxaparin 40 mg sq q24h Code Status: FULL CODE  Family Communication: No family present at bedside  Disposition Plan: Pending further clinical improvement and improvement in Rash and Blood Sugars  Status is: Inpatient  Remains inpatient appropriate because:Unsafe d/c plan, IV treatments appropriate due to intensity of illness or inability to take PO and Inpatient level of  care appropriate due to severity of illness   Dispo:  Patient From: Home  Planned Disposition: Home  Expected discharge date: 11/04/2020  Medically stable for discharge: No    Consultants:   Infectious Diseases    Procedures: None  Antimicrobials:  Anti-infectives (From admission, onward)   Start     Dose/Rate Route Frequency Ordered Stop   10/29/20 2000  vancomycin (VANCOREADY) IVPB 1500 mg/300 mL  Status:  Discontinued        1,500 mg 150 mL/hr over 120 Minutes Intravenous Every 24 hours 10/29/20 0203 10/29/20 1507   10/29/20 1600  cefTRIAXone (ROCEPHIN) 1 g in sodium chloride 0.9 % 100  mL IVPB  Status:  Discontinued        1 g 200 mL/hr over 30 Minutes Intravenous Every 24 hours 10/29/20 1507 10/30/20 1017   10/29/20 0015  ceFEPIme (MAXIPIME) 2 g in sodium chloride 0.9 % 100 mL IVPB  Status:  Discontinued        2 g 200 mL/hr over 30 Minutes Intravenous Every 8 hours 10/29/20 0004 10/29/20 1507   10/29/20 0015  vancomycin (VANCOCIN) IVPB 1000 mg/200 mL premix        1,000 mg 200 mL/hr over 60 Minutes Intravenous  Once 10/29/20 0004 10/29/20 0300   10/29/20 0000  metroNIDAZOLE (FLAGYL) IVPB 500 mg  Status:  Discontinued        500 mg 100 mL/hr over 60 Minutes Intravenous Every 8 hours 10/28/20 2346 10/29/20 1507        Subjective: Seen and examined and was a little short of breath this morning started coughing up some sputum.  Was noted to be hypoxic early this morning which improved with NSAIDs respiratory.  Thinks his rash is improving somewhat but continues to complain of low back pain.  No nausea or vomiting.  No other concerns or complaints at this time.  Objective: Vitals:   10/31/20 2025 11/01/20 0614 11/01/20 1014 11/01/20 1430  BP: 133/86 (!) 154/94  124/75  Pulse: 92 74  93  Resp: 20 20  18   Temp: 98 F (36.7 C) 97.9 F (36.6 C)  98.4 F (36.9 C)  TempSrc: Oral Oral  Oral  SpO2: (!) 88% 90% 95% 94%  Weight:  78 kg    Height:        Intake/Output  Summary (Last 24 hours) at 11/01/2020 1541 Last data filed at 11/01/2020 0142 Gross per 24 hour  Intake 112.25 ml  Output --  Net 112.25 ml   Filed Weights   10/28/20 2032 11/01/20 0614  Weight: 70.3 kg 78 kg   Examination: Physical Exam:  Constitutional: WN/WD slightly overweight Caucasian male currently in no acute distress appears mildly uncomfortable Eyes: Lids and conjunctivae normal, sclerae anicteric  ENMT: External Ears, Nose appear normal. Grossly normal hearing. Neck: Appears normal, supple, no cervical masses, normal ROM, no appreciable thyromegaly: No appreciable JVD Respiratory: Slightly diminished to auscultation bilaterally at the bases with some very slight coarse breath sounds, no wheezing, rales, rhonchi or crackles. Normal respiratory effort and patient is not tachypenic. No accessory muscle use.  Not wearing supplemental oxygen now Cardiovascular: RRR, no murmurs / rubs / gallops. S1 and S2 auscultated.  Has some mild 1+ lower from edema Abdomen: Soft, non-tender, distended secondary habitus.  Bowel sounds positive.  GU: Deferred. Musculoskeletal: No clubbing / cyanosis of digits/nails. No joint deformity upper and lower extremities.  Skin: Has a diffuse skin rash and has some excoriated lesions on his backside but rash seems to be improving is not erythematous. No induration; Warm and dry.  Neurologic: CN 2-12 grossly intact with no focal deficits.  Romberg sign and cerebellar reflexes not assessed.  Psychiatric: Normal judgment and insight. Alert and oriented x 3. Normal mood and appropriate affect.   Data Reviewed: I have personally reviewed following labs and imaging studies  CBC: Recent Labs  Lab 10/28/20 1716 10/29/20 0303 10/30/20 0520 10/31/20 0458 11/01/20 0504  WBC 23.0* 13.9* 16.9* 9.9 13.4*  NEUTROABS  --   --   --  9.3* 10.6*  HGB 15.0 11.4* 12.2* 11.6* 11.0*  HCT 44.7 32.6* 35.5* 33.9* 31.7*  MCV 91.2 87.6 88.5  89.4 88.3  PLT 534* 400 425* 325  177   Basic Metabolic Panel: Recent Labs  Lab 10/29/20 0303 10/29/20 0524 10/29/20 0617 10/30/20 0520 10/31/20 0458 11/01/20 0504  NA 132* 133* 135 133* 134* 138  K 3.1* 3.2* 3.3* 3.7 4.4 3.6  CL 94* 101 103 97* 99 100  CO2 24 20* 24 21* 24 29  GLUCOSE 603* 242* 216* 364* 520* 386*  BUN 15 15 15 10 11 17   CREATININE 1.04 1.09 1.06 1.19 1.15 0.92  CALCIUM 7.8* 8.6* 8.5* 8.7* 8.7* 8.7*  MG 2.3  --   --   --  2.0 2.2  PHOS  --   --   --   --  1.9* 2.7   GFR: Estimated Creatinine Clearance: 122.8 mL/min (by C-G formula based on SCr of 0.92 mg/dL). Liver Function Tests: Recent Labs  Lab 10/28/20 2343 10/31/20 0458 11/01/20 0504  AST 34 355* 145*  ALT 15 174* 150*  ALKPHOS 112 224* 198*  BILITOT 2.9* 1.2 0.6  PROT 7.3 6.3* 6.0*  ALBUMIN 3.6 3.0* 2.6*   Recent Labs  Lab 10/28/20 2343  LIPASE 20   No results for input(s): AMMONIA in the last 168 hours. Coagulation Profile: No results for input(s): INR, PROTIME in the last 168 hours. Cardiac Enzymes: Recent Labs  Lab 10/29/20 0524  CKTOTAL 37*   BNP (last 3 results) No results for input(s): PROBNP in the last 8760 hours. HbA1C: No results for input(s): HGBA1C in the last 72 hours. CBG: Recent Labs  Lab 10/31/20 1544 10/31/20 1648 10/31/20 2025 11/01/20 0745 11/01/20 1147  GLUCAP 368* 314* 363* 363* 302*   Lipid Profile: No results for input(s): CHOL, HDL, LDLCALC, TRIG, CHOLHDL, LDLDIRECT in the last 72 hours. Thyroid Function Tests: Recent Labs    10/29/20 1639  FREET4 0.69   Anemia Panel: Recent Labs    10/31/20 0458  VITAMINB12 2,301*  FOLATE 12.4  FERRITIN 458*  TIBC 183*  IRON 18*  RETICCTPCT 1.7   Sepsis Labs: Recent Labs  Lab 10/28/20 2343 10/28/20 2348 11/01/20 1027 11/01/20 1253  PROCALCITON 0.57  --  0.28  --   LATICACIDVEN  --  1.8 2.6* 1.6    Recent Results (from the past 240 hour(s))  Culture, blood (routine x 2)     Status: None (Preliminary result)   Collection  Time: 10/28/20  8:30 PM   Specimen: BLOOD  Result Value Ref Range Status   Specimen Description   Final    BLOOD RIGHT WRIST Performed at [facility] [address] [place], [place] [address]    Special Requests   Final    BOTTLES DRAWN AEROBIC AND ANAEROBIC Blood Culture adequate volume Performed at [facility] [address], [place], [place] [address]    Culture   Final    NO GROWTH 4 DAYS Performed at [facility], [place] [address] [place], [place] [address]    Report Status PENDING  Incomplete  Culture, blood (routine x 2)     Status: None (Preliminary result)   Collection Time: 10/28/20  8:30 PM   Specimen: BLOOD RIGHT FOREARM  Result Value Ref Range Status   Specimen Description   Final    BLOOD RIGHT FOREARM Performed at [facility] [address] [place], [place] [address]    Special Requests   Final    BOTTLES DRAWN AEROBIC AND ANAEROBIC Blood Culture results may not be optimal due to an inadequate  volume of blood received in culture bottles Performed at [facility] [address], [place], [place] [address]    Culture   Final    NO GROWTH 4 DAYS Performed at [facility], [place] [address], [place], [place] [address]    Report Status PENDING  Incomplete  SARS CORONAVIRUS 2 (TAT 6-24 HRS) Nasopharyngeal Nasopharyngeal Swab     Status: None   Collection Time: 10/29/20 12:21 AM   Specimen: Nasopharyngeal Swab  Result Value Ref Range Status   SARS Coronavirus 2 NEGATIVE NEGATIVE Final    Comment: (NOTE) SARS-CoV-2 target nucleic acids are NOT DETECTED.  The SARS-CoV-2 RNA is generally detectable in upper and lower respiratory specimens during the acute phase of infection. Negative results do not preclude SARS-CoV-2 infection, do not rule out co-infections with other pathogens, and should not be used as the sole basis for treatment or other patient management  decisions. Negative results must be combined with clinical observations, patient history, and epidemiological information. The expected result is Negative.  Fact Sheet for Patients: SugarRoll.be  Fact Sheet for Healthcare Providers: https://www.woods-mathews.com/  This test is not yet approved or cleared by the [place] FDA and  has been authorized for detection and/or diagnosis of SARS-CoV-2 by FDA under an Emergency Use Authorization (EUA). This EUA will remain  in effect (meaning this test can be used) for the duration of the COVID-19 declaration under Se ction 564(b)(1) of the Act, 21 U.S.C. section 360bbb-3(b)(1), unless the authorization is terminated or revoked sooner.  Performed at [facility], [place] [address] [place], [place] [address]   MRSA PCR Screening     Status: None   Collection Time: 10/29/20  1:06 AM   Specimen: Nasopharyngeal  Result Value Ref Range Status   MRSA by PCR NEGATIVE NEGATIVE Final    Comment:        The GeneXpert MRSA Assay (FDA approved for NASAL specimens only), is one component of a comprehensive MRSA colonization surveillance program. It is not intended to diagnose MRSA infection nor to guide or monitor treatment for MRSA infections. Performed at [facility], [place] [address], [place], [place] [address]     RN Pressure Injury Documentation:   Estimated body mass index is 27.76 kg/m as calculated from the following:   Height as of this encounter: 5' 6"  (1.676 m).   Weight as of this encounter: 78 kg.  Malnutrition Type:  Nutrition Problem: Increased nutrient needs Etiology: acute illness,catabolic illness (DVVOH-60 infection)  Malnutrition Characteristics:  Signs/Symptoms: estimated needs   Nutrition Interventions:  Interventions: Glucerna shake,Prostat,MVI   Radiology Studies: CT LUMBAR SPINE WO CONTRAST  Result Date: 10/31/2020 CLINICAL DATA:  Low  back pain. Overlying skin infection. Assess for spinal infection. EXAM: CT LUMBAR SPINE WITHOUT CONTRAST TECHNIQUE: Multidetector CT imaging of the lumbar spine was performed without intravenous contrast administration. Multiplanar CT image reconstructions were also generated. COMPARISON:  None. FINDINGS: Segmentation: 5 lumbar type vertebral bodies. Alignment: Normal Vertebrae: No focal bone lesion. No finding to suggest osteomyelitis. Paraspinal and other soft tissues: Possible soft tissue inflammatory change posterior to the S1 level within the subcutaneous fat. No evidence of discrete drainable abscess. Disc levels: Normal except for evidence of mild bulging of the disc at the L2-3 level, more towards the left. No apparent neural compression. IMPRESSION: 1. Possible soft tissue inflammatory change posterior to the S1 level within the subcutaneous fat. No evidence of discrete drainable abscess. No evidence of osteomyelitis. 2. Mild  bulging of the disc at L2-3 more towards the left. No apparent neural compression. Electronically Signed   By: Nelson Chimes M.D.   On: 10/31/2020 19:03   DG CHEST PORT 1 VIEW  Result Date: 11/01/2020 CLINICAL DATA:  Hypoxia EXAM: PORTABLE CHEST 1 VIEW COMPARISON:  October 28, 2020 FINDINGS: Areas of ill-defined airspace opacity are noted in the mid and lower lung regions bilaterally. No frank consolidation. Heart is upper normal in size with pulmonary vascularity normal. No adenopathy. No bone lesions. IMPRESSION: Areas of airspace opacity bilaterally, most notable in the left base. No appreciable consolidation. Question atypical organism pneumonia. Correlation with COVID-19 status advised in this regard. Heart upper normal in size. No adenopathy appreciable. Electronically Signed   By: Lowella Grip III M.D.   On: 11/01/2020 08:43   US Abdomen Limited RUQ (LIVER/GB)  Result Date: 11/01/2020 CLINICAL DATA:  Elevated liver enzymes EXAM: ULTRASOUND ABDOMEN LIMITED RIGHT  UPPER QUADRANT COMPARISON:  February 17, 2017 FINDINGS: Gallbladder: No gallstones or wall thickening visualized. There is no pericholecystic fluid. No sonographic Murphy sign noted by sonographer. Common bile duct: Diameter: 4 mm. No intrahepatic or extrahepatic biliary duct dilatation. Liver: No focal lesion identified. Within normal limits in parenchymal echogenicity. Portal vein is patent on color Doppler imaging with normal direction of blood flow towards the liver. Other: None. IMPRESSION: Study within normal limits. Electronically Signed   By: Lowella Grip III M.D.   On: 11/01/2020 08:42   Scheduled Meds: . (feeding supplement) PROSource Plus  30 mL Oral BID BM  . enoxaparin (LOVENOX) injection  40 mg Subcutaneous Q24H  . feeding supplement (GLUCERNA SHAKE)  237 mL Oral BID BM  . gabapentin  100 mg Oral BID  . guaiFENesin  1,200 mg Oral BID  . insulin aspart  0-5 Units Subcutaneous QHS  . insulin aspart  0-9 Units Subcutaneous TID WC  . insulin aspart  12 Units Subcutaneous TID WC  . insulin glargine  30 Units Subcutaneous Daily  . levothyroxine  150 mcg Oral Daily  . mouth rinse  15 mL Mouth Rinse BID  . multivitamin with minerals  1 tablet Oral Daily  . mupirocin cream   Topical BID  . predniSONE  40 mg Oral Q breakfast  . senna  1 tablet Oral QHS  . triamcinolone   Topical BID   Continuous Infusions: . sodium chloride 250 mL (11/01/20 0142)  . sodium chloride 75 mL/hr at 11/01/20 1533    LOS: 3 days   Kerney Elbe, DO [facility] PAGER is on AMION  If 7PM-7AM, please contact night-coverage [email or web address]

## 2020-11-01 NOTE — Plan of Care (Signed)
  Problem: Nutrition: Goal: Adequate nutrition will be maintained Outcome: Progressing   

## 2020-11-01 NOTE — Progress Notes (Signed)
Inpatient Diabetes Program Recommendations  AACE/ADA: New Consensus Statement on Inpatient Glycemic Control (2015)  Target Ranges:  Prepandial:   less than 140 mg/dL      Peak postprandial:   less than 180 mg/dL (1-2 hours)      Critically ill patients:  140 - 180 mg/dL   Lab Results  Component Value Date   GLUCAP 363 (H) 11/01/2020   HGBA1C 11.7 (H) 10/29/2020    Review of Glycemic Control  Diabetes history: DM1  Current orders for Inpatient glycemic control: Lantus 30 units QD, Novolog 0-9 units TID with meals and 0-5 HS + 10 units TID with meals  Prednisone 40 mg QAM  Inpatient Diabetes Program Recommendations:     Increase Novolog to 12 units TID with meals If FBS > 180 mg/dL on 7/62, increase Lantus to 34 units QD.  Continue to follow.  Thank you. Ailene Ards, RD, LDN, CDE Inpatient Diabetes Coordinator [phone]

## 2020-11-01 NOTE — Progress Notes (Signed)
RT NOTES: Found patient on room air. Sats 85%. Placed patient on 2lpm nasal cannula. Sats now 95%. Will continue to monitor.

## 2020-11-01 NOTE — Progress Notes (Signed)
[facility]  Date of Admission:  10/28/2020           Reason for visit: Follow up on rash  Current antibiotics: None  Principal Problem:   DKA, type 1 (HCC) Active Problems:   Hypothyroidism (acquired)   Nausea & vomiting   Leukocytosis   Rash   ASSESSMENT:    Diffuse rash of unclear etiology: Not convincing for infectious etiology given chronicity of symptoms and now improvement with steroids. Low back pain: CT obtained with some soft tissue inflammation posterior to S1 within the subcutaneous fat but no evidence of abscess or other infection Leukocytosis: Likely related to steroids Poorly controlled type 1 diabetes Hashimoto's disease Elevated LFTs: Improving and right upper quadrant ultrasound unremarkable Chest x-ray with possible pneumonia: Patient remains afebrile.  Intermittently requiring low levels of oxygen via nasal cannula.  Procalcitonin is not elevated.  Imaging reviewed and question whether this could be related to aggressive fluid resuscitation as opposed to bacterial pneumonia.  Respiratory viral pathogen panel is pending.  He is unvaccinated against COVID.  PLAN:    Continue steroids per primary team Recommend outpatient dermatology evaluation Glycemic control Do not feel like his presentation is consistent with bacterial pneumonia.  Would favor holding off antibiotics but if felt needed to treat it would be reasonable to provide a short course of Augmentin at discharge I will sign off, please call as needed  SUBJECTIVE:   24 hour events:  No acute events T-max 98.4 Mild increase in WBC likely related to steroids LFTs improving Continues off antibiotics   Patient believes that rash has started to improve.  It is still itchy.  Endorses some subjective shortness of breath, no significant cough.  Continues to endorse low back pain   OBJECTIVE:   Blood pressure 124/75, pulse 93, temperature 98.4 F (36.9 C),  temperature source Oral, resp. rate 18, height 5\' 6"  (1.676 m), weight 78 kg, SpO2 94 %. Body mass index is 27.76 kg/m.  Physical Exam Constitutional:      General: He is not in acute distress.    Appearance: Normal appearance.  HENT:     Head: Normocephalic and atraumatic.  Pulmonary:     Effort: Pulmonary effort is normal. No respiratory distress.  Skin:    Findings: Erythema, lesion and rash present.     Comments: Numerous skin lesions in various stages of healing with evidence of excoriation and thickened skin.  No significant purulence or drainage noted.  Diffuse eczematous rash appears to have improved with steroids.  Site of punch biopsy looks good.  Neurological:     General: No focal deficit present.     Mental Status: He is alert and oriented to person, place, and time.  Psychiatric:        Mood and Affect: Mood normal.        Behavior: Behavior normal.      Lab Results: Lab Results  Component Value Date   WBC 13.4 (H) 11/01/2020   HGB 11.0 (L) 11/01/2020   HCT 31.7 (L) 11/01/2020   MCV 88.3 11/01/2020   PLT 379 11/01/2020    Lab Results  Component Value Date   NA 138 11/01/2020   K 3.6 11/01/2020   CO2 29 11/01/2020   GLUCOSE 386 (H) 11/01/2020   BUN 17 11/01/2020   CREATININE 0.92 11/01/2020   CALCIUM 8.7 (L) 11/01/2020   GFRNONAA >60 11/01/2020   GFRAA >60 02/18/2017    Lab Results  Component Value Date   ALT 150 (H) 11/01/2020   AST 145 (H) 11/01/2020   ALKPHOS 198 (H) 11/01/2020   BILITOT 0.6 11/01/2020       Component Value Date/Time   CRP 18.0 (H) 10/28/2020 2350       Component Value Date/Time   ESRSEDRATE 53 (H) 10/28/2020 2343     I have reviewed the micro and lab results in Epic.  Imaging: CT LUMBAR SPINE WO CONTRAST  Result Date: 10/31/2020 CLINICAL DATA:  Low back pain. Overlying skin infection. Assess for spinal infection. EXAM: CT LUMBAR SPINE WITHOUT CONTRAST TECHNIQUE: Multidetector CT imaging of the lumbar spine was  performed without intravenous contrast administration. Multiplanar CT image reconstructions were also generated. COMPARISON:  None. FINDINGS: Segmentation: 5 lumbar type vertebral bodies. Alignment: Normal Vertebrae: No focal bone lesion. No finding to suggest osteomyelitis. Paraspinal and other soft tissues: Possible soft tissue inflammatory change posterior to the S1 level within the subcutaneous fat. No evidence of discrete drainable abscess. Disc levels: Normal except for evidence of mild bulging of the disc at the L2-3 level, more towards the left. No apparent neural compression. IMPRESSION: 1. Possible soft tissue inflammatory change posterior to the S1 level within the subcutaneous fat. No evidence of discrete drainable abscess. No evidence of osteomyelitis. 2. Mild bulging of the disc at L2-3 more towards the left. No apparent neural compression. Electronically Signed   By: Paulina Fusi M.D.   On: 10/31/2020 19:03   DG CHEST PORT 1 VIEW  Result Date: 11/01/2020 CLINICAL DATA:  Hypoxia EXAM: PORTABLE CHEST 1 VIEW COMPARISON:  October 28, 2020 FINDINGS: Areas of ill-defined airspace opacity are noted in the mid and lower lung regions bilaterally. No frank consolidation. Heart is upper normal in size with pulmonary vascularity normal. No adenopathy. No bone lesions. IMPRESSION: Areas of airspace opacity bilaterally, most notable in the left base. No appreciable consolidation. Question atypical organism pneumonia. Correlation with COVID-19 status advised in this regard. Heart upper normal in size. No adenopathy appreciable. Electronically Signed   By: Bretta Bang III M.D.   On: 11/01/2020 08:43   US Abdomen Limited RUQ (LIVER/GB)  Result Date: 11/01/2020 CLINICAL DATA:  Elevated liver enzymes EXAM: ULTRASOUND ABDOMEN LIMITED RIGHT UPPER QUADRANT COMPARISON:  February 17, 2017 FINDINGS: Gallbladder: No gallstones or wall thickening visualized. There is no pericholecystic fluid. No sonographic Murphy  sign noted by sonographer. Common bile duct: Diameter: 4 mm. No intrahepatic or extrahepatic biliary duct dilatation. Liver: No focal lesion identified. Within normal limits in parenchymal echogenicity. Portal vein is patent on color Doppler imaging with normal direction of blood flow towards the liver. Other: None. IMPRESSION: Study within normal limits. Electronically Signed   By: Bretta Bang III M.D.   On: 11/01/2020 08:42     Imaging  independently reviewed in Epic.    [facility] [phone] pager 11/01/2020, 3:10 PM

## 2020-11-01 NOTE — Progress Notes (Signed)
MD on unit and made aware of critical lactic acid 2.6, awaiting new orders

## 2020-11-02 ENCOUNTER — Inpatient Hospital Stay (HOSPITAL_COMMUNITY): Payer: BC Managed Care – PPO

## 2020-11-02 DIAGNOSIS — E1010 Type 1 diabetes mellitus with ketoacidosis without coma: Secondary | ICD-10-CM | POA: Diagnosis not present

## 2020-11-02 DIAGNOSIS — D72829 Elevated white blood cell count, unspecified: Secondary | ICD-10-CM | POA: Diagnosis not present

## 2020-11-02 DIAGNOSIS — R21 Rash and other nonspecific skin eruption: Secondary | ICD-10-CM | POA: Diagnosis not present

## 2020-11-02 DIAGNOSIS — E039 Hypothyroidism, unspecified: Secondary | ICD-10-CM | POA: Diagnosis not present

## 2020-11-02 LAB — CBC WITH DIFFERENTIAL/PLATELET
Abs Immature Granulocytes: 1.02 10*3/uL — ABNORMAL HIGH (ref 0.00–0.07)
Basophils Absolute: 0 10*3/uL (ref 0.0–0.1)
Basophils Relative: 0 %
Eosinophils Absolute: 0.7 10*3/uL — ABNORMAL HIGH (ref 0.0–0.5)
Eosinophils Relative: 4 %
HCT: 34.7 % — ABNORMAL LOW (ref 39.0–52.0)
Hemoglobin: 11.6 g/dL — ABNORMAL LOW (ref 13.0–17.0)
Immature Granulocytes: 6 %
Lymphocytes Relative: 11 %
Lymphs Abs: 1.7 10*3/uL (ref 0.7–4.0)
MCH: 30.1 pg (ref 26.0–34.0)
MCHC: 33.4 g/dL (ref 30.0–36.0)
MCV: 90.1 fL (ref 80.0–100.0)
Monocytes Absolute: 1.5 10*3/uL — ABNORMAL HIGH (ref 0.1–1.0)
Monocytes Relative: 9 %
Neutro Abs: 11.2 10*3/uL — ABNORMAL HIGH (ref 1.7–7.7)
Neutrophils Relative %: 70 %
Platelets: 494 10*3/uL — ABNORMAL HIGH (ref 150–400)
RBC: 3.85 MIL/uL — ABNORMAL LOW (ref 4.22–5.81)
RDW: 13 % (ref 11.5–15.5)
WBC: 16.2 10*3/uL — ABNORMAL HIGH (ref 4.0–10.5)
nRBC: 0.9 % — ABNORMAL HIGH (ref 0.0–0.2)

## 2020-11-02 LAB — COMPREHENSIVE METABOLIC PANEL
ALT: 97 U/L — ABNORMAL HIGH (ref 0–44)
AST: 30 U/L (ref 15–41)
Albumin: 2.7 g/dL — ABNORMAL LOW (ref 3.5–5.0)
Alkaline Phosphatase: 163 U/L — ABNORMAL HIGH (ref 38–126)
Anion gap: 10 (ref 5–15)
BUN: 18 mg/dL (ref 6–20)
CO2: 28 mmol/L (ref 22–32)
Calcium: 8.4 mg/dL — ABNORMAL LOW (ref 8.9–10.3)
Chloride: 100 mmol/L (ref 98–111)
Creatinine, Ser: 0.89 mg/dL (ref 0.61–1.24)
GFR, Estimated: >60 mL/min (ref >60)
Glucose, Bld: 264 mg/dL — ABNORMAL HIGH (ref 70–99)
Potassium: 3.1 mmol/L — ABNORMAL LOW (ref 3.5–5.1)
Sodium: 138 mmol/L (ref 135–145)
Total Bilirubin: 0.7 mg/dL (ref 0.3–1.2)
Total Protein: 5.9 g/dL — ABNORMAL LOW (ref 6.5–8.1)

## 2020-11-02 LAB — MAGNESIUM: Magnesium: 2.2 mg/dL (ref 1.7–2.4)

## 2020-11-02 LAB — GLUCOSE, CAPILLARY
Glucose-Capillary: 270 mg/dL — ABNORMAL HIGH (ref 70–99)
Glucose-Capillary: 258 mg/dL — ABNORMAL HIGH (ref 70–99)
Glucose-Capillary: 152 mg/dL — ABNORMAL HIGH (ref 70–99)
Glucose-Capillary: 136 mg/dL — ABNORMAL HIGH (ref 70–99)

## 2020-11-02 LAB — CULTURE, BLOOD (ROUTINE X 2)
Culture: NO GROWTH
Culture: NO GROWTH
Special Requests: ADEQUATE

## 2020-11-02 LAB — C-REACTIVE PROTEIN: CRP: 10.6 mg/dL — ABNORMAL HIGH (ref <1.0)

## 2020-11-02 LAB — PROCALCITONIN: Procalcitonin: 0.25 ng/mL

## 2020-11-02 LAB — PHOSPHORUS: Phosphorus: 2.2 mg/dL — ABNORMAL LOW (ref 2.5–4.6)

## 2020-11-02 LAB — SEDIMENTATION RATE: Sed Rate: 70 mm/hr — ABNORMAL HIGH (ref 0–16)

## 2020-11-02 MED ORDER — K PHOS MONO-SOD PHOS DI & MONO 155-852-130 MG PO TABS
500.0000 mg | ORAL_TABLET | Freq: Two times a day (BID) | ORAL | Status: AC
  Administered 2020-11-02 (×2): 500 mg via ORAL
  Filled 2020-11-02 (×2): qty 2

## 2020-11-02 MED ORDER — POTASSIUM CHLORIDE CRYS ER 20 MEQ PO TBCR
40.0000 meq | EXTENDED_RELEASE_TABLET | Freq: Once | ORAL | Status: AC
  Administered 2020-11-02: 40 meq via ORAL
  Filled 2020-11-02: qty 2

## 2020-11-02 MED ORDER — INSULIN GLARGINE 100 UNIT/ML ~~LOC~~ SOLN
38.0000 [IU] | Freq: Every day | SUBCUTANEOUS | Status: DC
  Administered 2020-11-03: 38 [IU] via SUBCUTANEOUS
  Filled 2020-11-02: qty 0.38

## 2020-11-02 NOTE — Progress Notes (Signed)
PROGRESS NOTE    David Ramsey  AJO:[id number] DOB: 1996-03-18 DOA: 10/28/2020 PCP: Patient, No Pcp Per   Brief Narrative:  HPI per Dr. Shela Ramsey on 10/28/20 David Ramsey is a 25 y.o. 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  Patient reported not taking his insulin for the past few months.  In the ED, afebrile.  Tachycardic and tachypneic.  Not hypotensive or hypoxic.  Labs showing WBC 23.0, hemoglobin 15.0, platelet count 534K. Sodium 132, potassium 4.3, chloride 97, bicarb 7, anion gap 28, BUN 18, creatinine 1.4, glucose 426.  UA with ketones.  Beta hydroxybutyric acid level pending.  VBG with pH 7.1, PCO2 17.  TSH pending.  Blood culture x2 pending.  Chest x-ray negative for acute cardiopulmonary abnormality.  PCCM consulted and felt that the patient did not need ICU admission.  Patient states he is having an "infection" all over his body and is itching.  Reports noticing skin lesions on his legs initially about 2 months ago which are now all over.  States he was seen at urgent care for this problem and prescribed antibiotics which did not help.  For the past 2 days he is having fevers as high as 103 F.  He has been vomiting since yesterday.  Denies abdominal pain or diarrhea.  States he is a type I diabetic and has been on insulin since the age of 28.  He was previously on Lantus and Humalog but has not been able to take them for the past 3 months due to not being able to afford them.  States he has not been able to make any appointments with his doctor and has not heard back from their office.  States he bought Novolin 70/30 from [place] but is not using it regularly.  He is not vaccinated against COVID.  Denies headaches, cough, or shortness of breath.  **Interim History Insulin is being adjusted as he continues to have some  hyperglycemia.  ID was consulted given concern for infectious etiology of his rash and excoriations.  Antibiotics were stopped and he did not spiked a temperature.  May need to reinitiate antibiotics but will hold off for now.  Rash was getting worse so we will give a dose of Solu-Medrol 125 mg x 1 and this was done on 10/30/20 and we started prednisone 40 mill daily.  We will work the patient up for vasculitis and check ANCA and ANA as well as check a hepatitis panel given his abnormal LFTs.  General surgery was consulted for punch biopsy which results are still pending.  LFTs are trending down now and liver ultrasound done and was within normal limits.  Overnight 2/24-2/25 the patient became hypoxic desaturated to the mid 80s on room air.  Chest x-ray shows a atypical pneumonia so we will retest for Covid and check a respiratory virus panel.  We will hold off on antibiotics currently given that he is afebrile now.  White blood cell count did go up but in the setting of steroid demargination and continues to ride.  We have added breathing treatments as well as guaifenesin and Claritin valve and incentive spirometry.  If necessary and he if he worsens will start Augmentin for antibiotics but will hold off.  Ambulatory home O2 screen done today showed that the patient did desaturate to the mid 80s and was placed on 2 L  with improvement.  Not wearing supplemental oxygen via nasal cannula.  Chest x-ray today showed improvement in airspace disease and inflammatory markers are trending down except his ESR which is slightly elevated.  Assessment & Plan:   Principal Problem:   DKA, type 1 ([place]) Active Problems:   Hypothyroidism (acquired)   Nausea & vomiting   Leukocytosis   Rash   Severe DKA type I, secondary to medication noncompliance -Presented with serum glucose 426, serum bicarb 7, anion gap 28, ketonuria, beta hydroxybutyrate acid greater than 8.0, VBG pH 7.1. -Completed DKA protocol and transitioned  to subcu insulin -DC isotonic bicarb drip and D5 LR -IV fluid hydration is now stopped. -Hemoglobin A1c 11.7 on 10/29/20 -Increased Lantus to 35 units may go to 30 8 AM and added 10 units Novolog sq TIDwm in addition to SSI and now increase the 10 units 3 times daily with meals to 12 units and if he continues to have fasting blood sugars greater than 180 -CBGs ranging from 258-302 -Diabetes Education Coordinator consulted and appreciate further evaluation and recommendations and following closely -If his blood sugars remain elevated will make further adjustments still  Diffuse Rash with Excoriation, unclear etiology.  Improving -Was started on broad-spectrum IV antibiotics on admission and now stopped by ID -MRSA Negative on 10/29/2020 -DC IV vancomycin, cefepime and Flagyl -IV Rocephin has now been stopped by infectious diseases  -CK was 37, LDH was 154, CRP was 18.0 and has now trended down to 10.6, PCT was 0.57, LA was 1.8, and ESR was 53 slightly worsened to 70 -C/w IV Benadryl 25 mg q8h, -Will give IV Solumedrol 125 mg x1 and start Prednisone 40 mg po Daily and this was done and will continue daily prednisone -Will ask General Surgery to come by in the AM and do a Punch Biopsy  -Check UDS -We will check ANCA which is negative so far and ANA testing which was negative along with syphilisand RPR which was negative as well as HIV (Negative)  -He has an area of induration on his lower back so we will obtain a CT scan to evaluate for infection -We will add mupirocin to open excoriated areas twice daily and stop hydrocortisone cream and start triamcinolone 0.1% cream -Follow-up on biopsy results by surgery and this is still pending pending -And also added hydroxyzine 25 mg p.o. 3 times daily as needed itching -We will need a dermatology evaluation at discharge  Abnormal LFTs, improving -His AST went from 34 and trended up to 355 and ALT went from 15 and is now 134; now AST and ALT have  improved and AST is now 30, ALT is now 97 -Acute hepatitis panel was negative and right upper quadrant ultrasound was within normal limits -Continue monitor and trend hepatic function panel -Repeat CMP in a.m.  Acquired Hypothyroidism due to history of Hashimoto's disease -TSH was 56.769 -Obtained free T4 and was normal at 0.69 -Resume home levothyroxine at 150 mcg po Daily   Low back pain -Has a pain where his induration is and the site today improved and actually burst.  Had some purulent drainage -Lower back CT scan done and showed "Possible soft tissue inflammatory change posterior to the S1 level within the subcutaneous fat. No evidence of discrete drainable abscess. No evidence of osteomyelitis. 2. Mild bulging of the disc at L2-3 more towards the left. No apparent neural compression." -Will add K Pad   -Continue with mupirocin -Follow-up with general surgery in the outpatient setting  Hypokalemia -  Potassium was 3.1 this morning and will replete with p.o. potassium chloride 40 milliequivalents x1 as well as p.o. K-Phos Neutral 500 mg p.o. twice daily x2 doses doses as well as p.o. K-Phos Neutral -DC isotonic bicarb which can also contribute -Continue to monitor and replete as necessary -Repeat BMP in the morning  Atypical Right Sided Pneumonia  Acute respiratory failure with hypoxia in the setting of the atypical pneumonia -Urine negative for pyuria or protein but did show Rare Bacteria  0-5 RBC/HPF, 0-5 WBC -Blood culture negative to date. -No evidence of pneumonia on chest x-ray initially but repeat chest x-ray 11/01/20 showed "Areas of airspace opacity bilaterally, most notable in the left base. No appreciable consolidation. Question atypical organism pneumonia. Correlation with COVID-19 status advised in this regard. Heart upper normal in size. No adenopathy appreciable"  -Repeat CXR 11/02/20 showed "Improving bibasilar atelectasis. Persistent right hilar opacity with  associated right-sided volume loss. Follow-up chest radiograph is recommended in 3-4 weeks to document resolution. If persistent, dedicated CT examination of the chest with contrast is recommended to exclude a central obstructing lesion." -Procalcitonin has actually trended down from 0.57 is now 0.28 yesterday and today 0.25 -Lactic acid level was slightly elevated 2.6 and is now 1.6 -Given another 1 L bolus yesterday -SpO2: 95 % O2 Flow Rate (L/min): 2 L/min -WBC had trended down and now trending back up in the setting of steroid demargination -Was on IV Ceftriaxone but stopped by ID -CK was 37, LDH was 154, CRP was 18.0, PCT was 0.57, LA was 1.8, and ESR was 53 -May need to reinitiate antibiotics but will continue to monitor and treat temperature but he has been afebrile -WBC went from 23.0 -> 13.9 -> 16.9 and is now resolved 9.9 and now slightly worse again to 13.4 yesterday and today is 16.2 in the setting of steroids -Continue guaifenesin 1200 mg p.o. twice daily, flutter valve, incentive spirometry as well as DuoNebs -Holding off on antibiotics for now; ID evaluated and feel that his presentation is not consistent with bacterial pneumonia and would favor holding off antibiotics but if it is felt that needs to be treated would be reasonable to provide a short course of Augmentin at discharge -Repeating Covid testing and respiratory virus panel and this was negative -Ambulatory home O2 screen done and he did desaturate; will reorder testing in the a.m.  Normocytic Anemia -Patient's Hgb/Hct went from 15.0/44.7 (Hemoconcentrated on Admission) and went to 11.4/32.6 and yesterday it was 12.2/35.5 today it is 11.6/33.9 -Check Anemia Panel in the AM iron level was 18, TIBC 165, TIBC was 183, saturation ratios were 10%, ferritin level was 458, folate level was 12.3, and vitamin B12 level 2301 -Continue to Monitor for S/Sx of Bleeding; Currently no overt bleeding noted -Repeat CBC in the AM    Thrombocytosis -In the setting of Above -Platelet Count went from 534 -> 400 -> 425 ->325 -> 379 -> 494 -Continue to Monitor and Trend -Repeat CBC in the AM  HypoNatremia (suspect PseudoHyponatremia) -Patient's Na+ is 138 and improved -Continue to Monitor and Trend -Status post 3 L of normal saline boluses over the 5 days -Repeat CMP in the AM  Metabolic Acidosis -Mild and resolved -Patient's CO2 i is now improved with a CO2 of 28, anion gap of 10, chloride level 100 -Continue to Monitor and Trend -Repeat CMP in the AM   Hyperbilirubinemia -Patient's T Bili was 2.9 (Indirect Bilirubin 2.4 and Direct was 0.5) and is now improved and normalized to  0.7 -Obtaining right upper quadrant ultrasound and was normal as above -Continue to Monitor and Trend -Repeat CMP in AM   Hypophosphatemia -Patient's phosphorus level is 2.2 -replete with p.o. K-Phos Neutral 500 mg p.o. twice daily x2 doses -Continue to monitor and replete as necessary -Repeat phosphorus level  DVT prophylaxis: Enoxaparin 40 mg sq q24h Code Status: FULL CODE  Family Communication: No family present at bedside  Disposition Plan: Pending further clinical improvement and improvement in Rash and Blood Sugars  Status is: Inpatient  Remains inpatient appropriate because:Unsafe d/c plan, IV treatments appropriate due to intensity of illness or inability to take PO and Inpatient level of care appropriate due to severity of illness   Dispo:  Patient From: Home  Planned Disposition: Home  Expected discharge date: 11/04/2020  Medically stable for discharge: No    Consultants:   Infectious Diseases    Procedures: None  Antimicrobials:  Anti-infectives (From admission, onward)   Start     Dose/Rate Route Frequency Ordered Stop   10/29/20 2000  vancomycin (VANCOREADY) IVPB 1500 mg/300 mL  Status:  Discontinued        1,500 mg 150 mL/hr over 120 Minutes Intravenous Every 24 hours 10/29/20 0203 10/29/20 1507    10/29/20 1600  cefTRIAXone (ROCEPHIN) 1 g in sodium chloride 0.9 % 100 mL IVPB  Status:  Discontinued        1 g 200 mL/hr over 30 Minutes Intravenous Every 24 hours 10/29/20 1507 10/30/20 1017   10/29/20 0015  ceFEPIme (MAXIPIME) 2 g in sodium chloride 0.9 % 100 mL IVPB  Status:  Discontinued        2 g 200 mL/hr over 30 Minutes Intravenous Every 8 hours 10/29/20 0004 10/29/20 1507   10/29/20 0015  vancomycin (VANCOCIN) IVPB 1000 mg/200 mL premix        1,000 mg 200 mL/hr over 60 Minutes Intravenous  Once 10/29/20 0004 10/29/20 0300   10/29/20 0000  metroNIDAZOLE (FLAGYL) IVPB 500 mg  Status:  Discontinued        500 mg 100 mL/hr over 60 Minutes Intravenous Every 8 hours 10/28/20 2346 10/29/20 1507        Subjective: Seen and examined and was feeling better and states that his indurated area on his lower back burst and he started having some drainage.  No chest pain, lightheadedness or dizziness.  Feels little bit better today and thinks his rash is much improved compared to a few days ago.  He ambulated in the hallway and did desaturate slightly.  We will continue his breathing exercises and anticipating discharging the next 24 to 48 hours as he is improving and his blood sugars are relatively better controlled.  Objective: Vitals:   11/01/20 1430 11/01/20 2231 11/02/20 0412 11/02/20 1154  BP: 124/75 (!) 155/90 (!) 144/94 (!) 160/102  Pulse: 93 79  89  Resp: 18 16 (!) 24 18  Temp: 98.4 F (36.9 C) 98.2 F (36.8 C) 97.8 F (36.6 C) 98.9 F (37.2 C)  TempSrc: Oral Oral Oral Oral  SpO2: 94% (!) 86% 93% 95%  Weight:      Height:        Intake/Output Summary (Last 24 hours) at 11/02/2020 1547 Last data filed at 11/02/2020 0413 Gross per 24 hour  Intake 1656.51 ml  Output 2 ml  Net 1654.51 ml   Filed Weights   10/28/20 2032 11/01/20 0614  Weight: 70.3 kg 78 kg   Examination: Physical Exam:  Constitutional: WN/WD overweight Caucasian male  currently in no acute  distress Eyes: Lids and conjunctivae normal, sclerae anicteric  ENMT: External Ears, Nose appear normal. Grossly normal hearing. Neck: Appears normal, supple, no cervical masses, normal ROM, no appreciable thyromegaly: No appreciable JVD Respiratory: Diminished to auscultation bilaterally at the bases with coarse breath sounds and some slight crackles and mild rhonchi worse on the right compared to left, no wheezing, rales, rhonchi. Normal respiratory effort and patient is not tachypenic. No accessory muscle use.  Unlabored breathing and not wearing any supplemental oxygen via nasal cannula Cardiovascular: RRR, no murmurs / rubs / gallops. S1 and S2 auscultated. No extremity edema. 2+ pedal pulses. No carotid bruits.  Abdomen: Soft, non-tender, non-distended. No masses palpated. No appreciable hepatosplenomegaly. Bowel sounds positive.  GU: Deferred. Musculoskeletal: No clubbing / cyanosis of digits/nails. No joint deformity upper and lower extremities.  Skin: His rash is significantly improved and his excoriated areas are drying up and improving.  Has an indurated lesion on his mid back which is now open and draining some pus and purulence no induration; Warm and dry.  Neurologic: CN 2-12 grossly intact with no focal deficits. Romberg sign and cerebellar reflexes not assessed.  Psychiatric: Normal judgment and insight. Alert and oriented x 3. Normal mood and appropriate affect.   Data Reviewed: I have personally reviewed following labs and imaging studies  CBC: Recent Labs  Lab 10/29/20 0303 10/30/20 0520 10/31/20 0458 11/01/20 0504 11/02/20 0416  WBC 13.9* 16.9* 9.9 13.4* 16.2*  NEUTROABS  --   --  9.3* 10.6* 11.2*  HGB 11.4* 12.2* 11.6* 11.0* 11.6*  HCT 32.6* 35.5* 33.9* 31.7* 34.7*  MCV 87.6 88.5 89.4 88.3 90.1  PLT 400 425* 325 379 111*   Basic Metabolic Panel: Recent Labs  Lab 10/29/20 0303 10/29/20 0524 10/29/20 0617 10/30/20 0520 10/31/20 0458 11/01/20 0504  11/02/20 0416  NA 132*   < > 135 133* 134* 138 138  K 3.1*   < > 3.3* 3.7 4.4 3.6 3.1*  CL 94*   < > 103 97* 99 100 100  CO2 24   < > 24 21* _0 GLUCOSE 603*   < > 216* 364* 520* 386* 264*  BUN 15   < > _1 CREATININE 1.04   < > 1.06 1.19 1.15 0.92 0.89  CALCIUM 7.8*   < > 8.5* 8.7* 8.7* 8.7* 8.4*  MG 2.3  --   --   --  2.0 2.2 2.2  PHOS  --   --   --   --  1.9* 2.7 2.2*   < > = values in this interval not displayed.   GFR: Estimated Creatinine Clearance: 126.9 mL/min (by C-G formula based on SCr of 0.89 mg/dL). Liver Function Tests: Recent Labs  Lab 10/28/20 2343 10/31/20 0458 11/01/20 0504 11/02/20 0416  AST 34 355* 145* 30  ALT 15 174* 150* 97*  ALKPHOS 112 224* 198* 163*  BILITOT 2.9* 1.2 0.6 0.7  PROT 7.3 6.3* 6.0* 5.9*  ALBUMIN 3.6 3.0* 2.6* 2.7*   Recent Labs  Lab 10/28/20 2343  LIPASE 20   No results for input(s): AMMONIA in the last 168 hours. Coagulation Profile: No results for input(s): INR, PROTIME in the last 168 hours. Cardiac Enzymes: Recent Labs  Lab 10/29/20 0524  CKTOTAL 37*   BNP (last 3 results) No results for input(s): PROBNP in the last 8760 hours. HbA1C: No results for input(s): HGBA1C in the last 72 hours. CBG: Recent Labs  Lab 11/01/20 1147 11/01/20 1647 11/01/20 2231 11/02/20 0738 11/02/20 1152  GLUCAP 302* 270* 276* 270* 258*   Lipid Profile: No results for input(s): CHOL, HDL, LDLCALC, TRIG, CHOLHDL, LDLDIRECT in the last 72 hours. Thyroid Function Tests: No results for input(s): TSH, T4TOTAL, FREET4, T3FREE, THYROIDAB in the last 72 hours. Anemia Panel: Recent Labs    10/31/20 0458  VITAMINB12 2,301*  FOLATE 12.4  FERRITIN 458*  TIBC 183*  IRON 18*  RETICCTPCT 1.7   Sepsis Labs: Recent Labs  Lab 10/28/20 2343 10/28/20 2348 11/01/20 1027 11/01/20 1253 11/02/20 0416  PROCALCITON 0.57  --  0.28  --  0.25  LATICACIDVEN  --  1.8 2.6* 1.6  --     Recent Results (from the past 240 hour(s))   Culture, blood (routine x 2)     Status: None (Preliminary result)   Collection Time: 10/28/20  8:30 PM   Specimen: BLOOD  Result Value Ref Range Status   Specimen Description   Final    BLOOD RIGHT WRIST Performed at [facility] [address], [place], [place] [address]    Special Requests   Final    BOTTLES DRAWN AEROBIC AND ANAEROBIC Blood Culture adequate volume Performed at [facility] [address], [place], [place] [address]    Culture   Final    NO GROWTH 4 DAYS Performed at [facility], [place] [address], [place], [place] [address]    Report Status PENDING  Incomplete  Culture, blood (routine x 2)     Status: None (Preliminary result)   Collection Time: 10/28/20  8:30 PM   Specimen: BLOOD RIGHT FOREARM  Result Value Ref Range Status   Specimen Description   Final    BLOOD RIGHT FOREARM Performed at [facility] [address] [place], [place] [address]    Special Requests   Final    BOTTLES DRAWN AEROBIC AND ANAEROBIC Blood Culture results may not be optimal due to an inadequate volume of blood received in culture bottles Performed at [facility] [address], [place], [place] [address]    Culture   Final    NO GROWTH 4 DAYS Performed at [facility], [place] [address], [place], [place] [address]    Report Status PENDING  Incomplete  SARS CORONAVIRUS 2 (TAT 6-24 HRS) Nasopharyngeal Nasopharyngeal Swab     Status: None   Collection Time: 10/29/20 12:21 AM   Specimen: Nasopharyngeal Swab  Result Value Ref Range Status   SARS Coronavirus 2 NEGATIVE NEGATIVE Final    Comment: (NOTE) SARS-CoV-2 target nucleic acids are NOT DETECTED.  The SARS-CoV-2 RNA is generally detectable in upper and lower respiratory specimens during the acute phase of infection. Negative results do not preclude SARS-CoV-2 infection, do not rule out co-infections with other pathogens, and should not  be used as the sole basis for treatment or other patient management decisions. Negative results must be combined with clinical observations, patient history, and epidemiological information. The expected result is Negative.  Fact Sheet for Patients: SugarRoll.be  Fact Sheet for Healthcare Providers: https://www.woods-mathews.com/  This test is not yet approved or cleared by the [place] FDA and  has been authorized for detection and/or diagnosis of SARS-CoV-2 by FDA under an Emergency Use Authorization (EUA). This EUA will remain  in effect (meaning this test can be used) for the duration of the COVID-19 declaration under Se ction 564(b)(1) of the Act, 21 U.S.C. section 360bbb-3(b)(1),  unless the authorization is terminated or revoked sooner.  Performed at [facility], [place] [address] [place], [place] [address]   MRSA PCR Screening     Status: None   Collection Time: 10/29/20  1:06 AM   Specimen: Nasopharyngeal  Result Value Ref Range Status   MRSA by PCR NEGATIVE NEGATIVE Final    Comment:        The GeneXpert MRSA Assay (FDA approved for NASAL specimens only), is one component of a comprehensive MRSA colonization surveillance program. It is not intended to diagnose MRSA infection nor to guide or monitor treatment for MRSA infections. Performed at [facility], [place] [address], [place], [place] [address]   Respiratory (~20 pathogens) panel by PCR     Status: None   Collection Time: 11/01/20  2:37 PM  Result Value Ref Range Status   Adenovirus NOT DETECTED NOT DETECTED Final   Coronavirus 229E NOT DETECTED NOT DETECTED Final    Comment: (NOTE) The Coronavirus on the Respiratory Panel, DOES NOT test for the novel  Coronavirus (2019 nCoV)    Coronavirus HKU1 NOT DETECTED NOT DETECTED Final   Coronavirus NL63 NOT DETECTED NOT DETECTED Final   Coronavirus OC43 NOT DETECTED NOT DETECTED Final    Metapneumovirus NOT DETECTED NOT DETECTED Final   Rhinovirus / Enterovirus NOT DETECTED NOT DETECTED Final   Influenza A NOT DETECTED NOT DETECTED Final   Influenza B NOT DETECTED NOT DETECTED Final   Parainfluenza Virus 1 NOT DETECTED NOT DETECTED Final   Parainfluenza Virus 2 NOT DETECTED NOT DETECTED Final   Parainfluenza Virus 3 NOT DETECTED NOT DETECTED Final   Parainfluenza Virus 4 NOT DETECTED NOT DETECTED Final   Respiratory Syncytial Virus NOT DETECTED NOT DETECTED Final   Bordetella pertussis NOT DETECTED NOT DETECTED Final   Bordetella Parapertussis NOT DETECTED NOT DETECTED Final   Chlamydophila pneumoniae NOT DETECTED NOT DETECTED Final   Mycoplasma pneumoniae NOT DETECTED NOT DETECTED Final    Comment: Performed at [facility], [place]. [address] [place], [place] [address]  Resp panel by RT-PCR (RSV, Flu A&B, Covid)     Status: None   Collection Time: 11/01/20  2:37 PM  Result Value Ref Range Status   SARS Coronavirus 2 by RT PCR NEGATIVE NEGATIVE Final    Comment: (NOTE) SARS-CoV-2 target nucleic acids are NOT DETECTED.  The SARS-CoV-2 RNA is generally detectable in upper respiratory specimens during the acute phase of infection. The lowest concentration of SARS-CoV-2 viral copies this assay can detect is 138 copies/mL. A negative result does not preclude SARS-Cov-2 infection and should not be used as the sole basis for treatment or other patient management decisions. A negative result may occur with  improper specimen collection/handling, submission of specimen other than nasopharyngeal swab, presence of viral mutation(s) within the areas targeted by this assay, and inadequate number of viral copies(<138 copies/mL). A negative result must be combined with clinical observations, patient history, and epidemiological information. The expected result is Negative.  Fact Sheet for Patients:  EntrepreneurPulse.com.au  Fact Sheet for Healthcare  Providers:  IncredibleEmployment.be  This test is no t yet approved or cleared by the [place] FDA and  has been authorized for detection and/or diagnosis of SARS-CoV-2 by FDA under an Emergency Use Authorization (EUA). This EUA will remain  in effect (meaning this test can be used) for the duration of the COVID-19 declaration under Section 564(b)(1) of the Act, 21 U.S.C.section 360bbb-3(b)(1), unless the authorization is terminated  or revoked sooner.       Influenza A by PCR NEGATIVE NEGATIVE Final   Influenza B by PCR NEGATIVE NEGATIVE Final    Comment: (NOTE) The Xpert Xpress SARS-CoV-2/FLU/RSV plus assay is intended as an aid in the diagnosis of influenza from Nasopharyngeal swab specimens and should not be used as a sole basis for treatment. Nasal washings and aspirates are unacceptable for Xpert Xpress SARS-CoV-2/FLU/RSV testing.  Fact Sheet for Patients: EntrepreneurPulse.com.au  Fact Sheet for Healthcare Providers: IncredibleEmployment.be  This test is not yet approved or cleared by the [place] FDA and has been authorized for detection and/or diagnosis of SARS-CoV-2 by FDA under an Emergency Use Authorization (EUA). This EUA will remain in effect (meaning this test can be used) for the duration of the COVID-19 declaration under Section 564(b)(1) of the Act, 21 U.S.C. section 360bbb-3(b)(1), unless the authorization is terminated or revoked.     Resp Syncytial Virus by PCR NEGATIVE NEGATIVE Final    Comment: (NOTE) Fact Sheet for Patients: EntrepreneurPulse.com.au  Fact Sheet for Healthcare Providers: IncredibleEmployment.be  This test is not yet approved or cleared by the [place] FDA and has been authorized for detection and/or diagnosis of SARS-CoV-2 by FDA under an Emergency Use Authorization (EUA). This EUA will remain in effect (meaning this test can be  used) for the duration of the COVID-19 declaration under Section 564(b)(1) of the Act, 21 U.S.C. section 360bbb-3(b)(1), unless the authorization is terminated or revoked.  Performed at [facility], [place] [address] [place], [place] [address]     RN Pressure Injury Documentation:   Estimated body mass index is 27.76 kg/m as calculated from the following:   Height as of this encounter: _0  (1.676 m).   Weight as of this encounter: 78 kg.  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 Overlying skin infection. Assess for spinal infection. EXAM: CT LUMBAR SPINE WITHOUT CONTRAST TECHNIQUE: Multidetector CT imaging of the lumbar spine was performed without intravenous contrast administration. Multiplanar CT image reconstructions were also generated. COMPARISON:  None. FINDINGS: Segmentation: 5 lumbar type vertebral bodies. Alignment: Normal Vertebrae: No focal bone lesion. No finding to suggest osteomyelitis. Paraspinal and other soft tissues: Possible soft tissue inflammatory change posterior to the S1 level within the subcutaneous fat. No evidence of discrete drainable abscess. Disc levels: Normal except for evidence of mild bulging of the disc at the L2-3 level, more towards the left. No apparent neural compression. IMPRESSION: 1. Possible soft tissue inflammatory change posterior to the S1 level within the subcutaneous fat. No evidence of discrete drainable abscess. No evidence of osteomyelitis. 2. Mild bulging of the disc at L2-3 more towards the left. No apparent neural compression. Electronically Signed   By: Nelson Chimes M.D.   On: 10/31/2020 19:03   DG  CHEST PORT 1 VIEW  Result Date: 11/02/2020 CLINICAL DATA:  Dyspnea EXAM: PORTABLE CHEST 1 VIEW COMPARISON:  11/01/2020 FINDINGS: Bibasilar pulmonary opacities have improved likely reflecting improving bibasilar atelectasis. Asymmetric right perihilar opacity persists possibly related to focal infiltrate, mass or adenopathy in this region. Associated right-sided volume loss is again seen. No pneumothorax or pleural effusion. Cardiac size within normal limits. Pulmonary vascularity is normal. IMPRESSION: Improving bibasilar atelectasis. Persistent right hilar opacity with associated right-sided  volume loss. Follow-up chest radiograph is recommended in 3-4 weeks to document resolution. If persistent, dedicated CT examination of the chest with contrast is recommended to exclude a central obstructing lesion. Electronically Signed   By: Fidela Salisbury MD   On: 11/02/2020 06:31   DG CHEST PORT 1 VIEW  Result Date: 11/01/2020 CLINICAL DATA:  Hypoxia EXAM: PORTABLE CHEST 1 VIEW COMPARISON:  October 28, 2020 FINDINGS: Areas of ill-defined airspace opacity are noted in the mid and lower lung regions bilaterally. No frank consolidation. Heart is upper normal in size with pulmonary vascularity normal. No adenopathy. No bone lesions. IMPRESSION: Areas of airspace opacity bilaterally, most notable in the left base. No appreciable consolidation. Question atypical organism pneumonia. Correlation with COVID-19 status advised in this regard. Heart upper normal in size. No adenopathy appreciable. Electronically Signed   By: Lowella Grip III M.D.   On: 11/01/2020 08:43   US Abdomen Limited RUQ (LIVER/GB)  Result Date: 11/01/2020 CLINICAL DATA:  Elevated liver enzymes EXAM: ULTRASOUND ABDOMEN LIMITED RIGHT UPPER QUADRANT COMPARISON:  February 17, 2017 FINDINGS: Gallbladder: No gallstones or wall thickening visualized. There is no pericholecystic fluid. No sonographic Murphy sign noted by sonographer. Common bile duct:  Diameter: 4 mm. No intrahepatic or extrahepatic biliary duct dilatation. Liver: No focal lesion identified. Within normal limits in parenchymal echogenicity. Portal vein is patent on color Doppler imaging with normal direction of blood flow towards the liver. Other: None. IMPRESSION: Study within normal limits. Electronically Signed   By: Lowella Grip III M.D.   On: 11/01/2020 08:42   Scheduled Meds: . (feeding supplement) PROSource Plus  30 mL Oral BID BM  . enoxaparin (LOVENOX) injection  40 mg Subcutaneous Q24H  . feeding supplement (GLUCERNA SHAKE)  237 mL Oral BID BM  . gabapentin  100 mg Oral BID  . guaiFENesin  1,200 mg Oral BID  . insulin aspart  0-5 Units Subcutaneous QHS  . insulin aspart  0-9 Units Subcutaneous TID WC  . insulin aspart  12 Units Subcutaneous TID WC  . insulin glargine  35 Units Subcutaneous Daily  . levothyroxine  150 mcg Oral Daily  . mouth rinse  15 mL Mouth Rinse BID  . multivitamin with minerals  1 tablet Oral Daily  . mupirocin cream   Topical BID  . phosphorus  500 mg Oral BID  . predniSONE  40 mg Oral Q breakfast  . senna  1 tablet Oral QHS  . triamcinolone   Topical BID   Continuous Infusions: . sodium chloride 250 mL (11/01/20 0142)    LOS: 4 days   Kerney Elbe, DO [facility] PAGER is on [place]  If 7PM-7AM, please contact night-coverage [email or web address]

## 2020-11-02 NOTE — Progress Notes (Signed)
Pt ambulated length of hallway x1 on room air. On room air oxygen saturations 87-88%. Heart rate increased to 120 s. Pt with c/o feeling slightly lightheaded with ambulation. Pt back to room and in chair with oxygen on 2l [place] and Pt reports that feeling light headed has resolved.

## 2020-11-03 ENCOUNTER — Inpatient Hospital Stay (HOSPITAL_COMMUNITY): Payer: BC Managed Care – PPO

## 2020-11-03 DIAGNOSIS — E1010 Type 1 diabetes mellitus with ketoacidosis without coma: Secondary | ICD-10-CM | POA: Diagnosis not present

## 2020-11-03 DIAGNOSIS — R112 Nausea with vomiting, unspecified: Secondary | ICD-10-CM | POA: Diagnosis not present

## 2020-11-03 DIAGNOSIS — D72829 Elevated white blood cell count, unspecified: Secondary | ICD-10-CM | POA: Diagnosis not present

## 2020-11-03 DIAGNOSIS — E039 Hypothyroidism, unspecified: Secondary | ICD-10-CM | POA: Diagnosis not present

## 2020-11-03 LAB — CBC WITH DIFFERENTIAL/PLATELET
Abs Immature Granulocytes: 1.02 10*3/uL — ABNORMAL HIGH (ref 0.00–0.07)
Basophils Absolute: 0 10*3/uL (ref 0.0–0.1)
Basophils Relative: 0 %
Eosinophils Absolute: 0.9 10*3/uL — ABNORMAL HIGH (ref 0.0–0.5)
Eosinophils Relative: 8 %
HCT: 33.1 % — ABNORMAL LOW (ref 39.0–52.0)
Hemoglobin: 11.2 g/dL — ABNORMAL LOW (ref 13.0–17.0)
Immature Granulocytes: 9 %
Lymphocytes Relative: 18 %
Lymphs Abs: 2 10*3/uL (ref 0.7–4.0)
MCH: 30.6 pg (ref 26.0–34.0)
MCHC: 33.8 g/dL (ref 30.0–36.0)
MCV: 90.4 fL (ref 80.0–100.0)
Monocytes Absolute: 1.4 10*3/uL — ABNORMAL HIGH (ref 0.1–1.0)
Monocytes Relative: 13 %
Neutro Abs: 6 10*3/uL (ref 1.7–7.7)
Neutrophils Relative %: 52 %
Platelets: 470 10*3/uL — ABNORMAL HIGH (ref 150–400)
RBC: 3.66 MIL/uL — ABNORMAL LOW (ref 4.22–5.81)
RDW: 12.8 % (ref 11.5–15.5)
WBC: 11.4 10*3/uL — ABNORMAL HIGH (ref 4.0–10.5)
nRBC: 1.8 % — ABNORMAL HIGH (ref 0.0–0.2)

## 2020-11-03 LAB — COMPREHENSIVE METABOLIC PANEL
ALT: 65 U/L — ABNORMAL HIGH (ref 0–44)
AST: 25 U/L (ref 15–41)
Albumin: 2.3 g/dL — ABNORMAL LOW (ref 3.5–5.0)
Alkaline Phosphatase: 129 U/L — ABNORMAL HIGH (ref 38–126)
Anion gap: 11 (ref 5–15)
BUN: 16 mg/dL (ref 6–20)
CO2: 27 mmol/L (ref 22–32)
Calcium: 8.2 mg/dL — ABNORMAL LOW (ref 8.9–10.3)
Chloride: 102 mmol/L (ref 98–111)
Creatinine, Ser: 0.91 mg/dL (ref 0.61–1.24)
GFR, Estimated: >60 mL/min (ref >60)
Glucose, Bld: 253 mg/dL — ABNORMAL HIGH (ref 70–99)
Potassium: 3.1 mmol/L — ABNORMAL LOW (ref 3.5–5.1)
Sodium: 140 mmol/L (ref 135–145)
Total Bilirubin: 0.7 mg/dL (ref 0.3–1.2)
Total Protein: 5.4 g/dL — ABNORMAL LOW (ref 6.5–8.1)

## 2020-11-03 LAB — SEDIMENTATION RATE: Sed Rate: 47 mm/hr — ABNORMAL HIGH (ref 0–16)

## 2020-11-03 LAB — GLUCOSE, CAPILLARY
Glucose-Capillary: 315 mg/dL — ABNORMAL HIGH (ref 70–99)
Glucose-Capillary: 182 mg/dL — ABNORMAL HIGH (ref 70–99)

## 2020-11-03 LAB — PROCALCITONIN: Procalcitonin: 0.11 ng/mL

## 2020-11-03 LAB — MAGNESIUM: Magnesium: 2.1 mg/dL (ref 1.7–2.4)

## 2020-11-03 LAB — C-REACTIVE PROTEIN: CRP: 6.3 mg/dL — ABNORMAL HIGH (ref <1.0)

## 2020-11-03 LAB — PHOSPHORUS: Phosphorus: 4.1 mg/dL (ref 2.5–4.6)

## 2020-11-03 MED ORDER — INSULIN GLARGINE 100 UNIT/ML SOLOSTAR PEN: 38.0000 [IU] | PEN_INJECTOR | Freq: Every day | SUBCUTANEOUS | 0 refills | Status: DC

## 2020-11-03 MED ORDER — INSULIN ASPART 100 UNIT/ML FLEXPEN: 12.0000 [IU] | PEN_INJECTOR | Freq: Three times a day (TID) | SUBCUTANEOUS | 0 refills | Status: DC

## 2020-11-03 MED ORDER — PROSOURCE PLUS PO LIQD: 30.0000 mL | Freq: Two times a day (BID) | ORAL | 0 refills | Status: DC

## 2020-11-03 MED ORDER — "PEN NEEDLES 3/16"" 31G X 5 MM MISC": 1.0000 | Freq: Three times a day (TID) | 0 refills | Status: DC

## 2020-11-03 MED ORDER — HIBICLENS 4 % EX LIQD: Freq: Every day | CUTANEOUS | 0 refills | Status: DC | PRN

## 2020-11-03 MED ORDER — TRIAMCINOLONE ACETONIDE 0.1 % EX CREA: TOPICAL_CREAM | Freq: Two times a day (BID) | CUTANEOUS | 0 refills | Status: DC

## 2020-11-03 MED ORDER — PREDNISONE 10 MG (21) PO TBPK: ORAL_TABLET | ORAL | 0 refills | Status: DC

## 2020-11-03 MED ORDER — SENNA 8.6 MG PO TABS: 1.0000 | ORAL_TABLET | Freq: Every day | ORAL | 0 refills | Status: DC

## 2020-11-03 MED ORDER — GUAIFENESIN ER 600 MG PO TB12
600.0000 mg | ORAL_TABLET | Freq: Two times a day (BID) | ORAL | 0 refills | Status: AC
Start: 2020-11-03 — End: 2020-11-08

## 2020-11-03 MED ORDER — INSULIN ASPART 100 UNIT/ML FLEXPEN: 0.0000 [IU] | PEN_INJECTOR | Freq: Three times a day (TID) | SUBCUTANEOUS | 0 refills | Status: DC

## 2020-11-03 MED ORDER — GLUCERNA SHAKE PO LIQD: 237.0000 mL | Freq: Two times a day (BID) | ORAL | 0 refills | Status: DC

## 2020-11-03 MED ORDER — BLOOD GLUCOSE MONITOR KIT: PACK | 0 refills | Status: AC

## 2020-11-03 MED ORDER — HYDROXYZINE HCL 25 MG PO TABS: 25.0000 mg | ORAL_TABLET | Freq: Three times a day (TID) | ORAL | 0 refills | Status: DC | PRN

## 2020-11-03 MED ORDER — OXYCODONE HCL 5 MG PO TABS: 5.0000 mg | ORAL_TABLET | Freq: Four times a day (QID) | ORAL | 0 refills | Status: DC | PRN

## 2020-11-03 MED ORDER — GABAPENTIN 100 MG PO CAPS: 100.0000 mg | ORAL_CAPSULE | Freq: Two times a day (BID) | ORAL | 0 refills | Status: DC

## 2020-11-03 MED ORDER — MUPIROCIN CALCIUM 2 % EX CREA: TOPICAL_CREAM | Freq: Two times a day (BID) | CUTANEOUS | 0 refills | Status: DC

## 2020-11-03 MED ORDER — FLUOXETINE HCL 20 MG PO CAPS: 20.0000 mg | ORAL_CAPSULE | Freq: Every day | ORAL | 0 refills | Status: DC

## 2020-11-03 MED ORDER — LEVOTHYROXINE SODIUM 150 MCG PO TABS: 150.0000 ug | ORAL_TABLET | Freq: Every day | ORAL | 0 refills | Status: DC

## 2020-11-03 MED ORDER — POTASSIUM CHLORIDE CRYS ER 20 MEQ PO TBCR
40.0000 meq | EXTENDED_RELEASE_TABLET | Freq: Two times a day (BID) | ORAL | Status: DC
  Administered 2020-11-03: 40 meq via ORAL
  Filled 2020-11-03: qty 2

## 2020-11-03 MED ORDER — ADULT MULTIVITAMIN W/MINERALS CH: 1.0000 | ORAL_TABLET | Freq: Every day | ORAL | 0 refills | Status: DC

## 2020-11-03 NOTE — Discharge Summary (Signed)
Physician Discharge Summary  David Ramsey IZT:[id number] DOB: 1996-04-27 DOA: 10/28/2020  PCP: Patient, No Pcp Per  Admit date: 10/28/2020 Discharge date: 11/03/2020  Admitted From: Home Disposition: Home  Recommendations for Outpatient Follow-up:  1. Follow up and establish with PCP in 1-2 weeks 2. Follow-up with endocrinology and referral was given for Dr. Delrae Rend 3. Follow-up with dermatology as an outpatient follow-up on biopsy results 4. Please obtain CMP/CBC, Mag, Phos in one week 5. Repeat chest x-ray in 3 to 6 weeks 6. Please follow up on the following pending results: Punch biopsy results  Home Health: No Equipment/Devices: None   Discharge Condition: Stable CODE STATUS: FULL CODE Diet recommendation: Carb Modified Diet  Brief/Interim Summary: HPI per Dr. Shela Leff on 10/28/20 David Jarvis Bowersis a 24 y.o.malewith medical history significant oftype 1 diabetes,acquiredhypothyroidismdue to history of Hashimoto's diseasepresented to the ED for evaluation of hyperglycemia, nausea, and vomiting. Patient reported not taking his insulin for the past few months. In the ED, afebrile. Tachycardic and tachypneic. Not hypotensive or hypoxic. Labs showing WBC 23.0, hemoglobin 15.0, platelet count 534K. Sodium 132, potassium 4.3, chloride 97, bicarb 7, anion gap 28, BUN 18, creatinine 1.4, glucose 426. UA with ketones. Beta hydroxybutyric acid level pending. VBG with pH 7.1, PCO2 17. TSH pending. Blood culture x2 pending. Chest x-ray negative for acute cardiopulmonary abnormality. PCCM consulted and felt that the patient did not need ICU admission.  Patient states he is having an"infection"all over his body and is itching.Reports noticing skin lesions on his legs initially about 2 months ago which are now all over.States he was seen at urgent care for this problem and prescribed antibiotics which did not help. For the past 2 days he is having fevers as  high as 103 F. He has been vomiting since yesterday. Denies abdominal pain or diarrhea. States he is a type I diabetic and has been on insulin since the age of 18. He was previously on Lantus and Humalog but has not been able to take them for the past 3 months due to not being able to afford them. States he has not been able to make any appointments with his doctor and has not heard back from their office. States he bought Novolin 70/30 from [employer] but is not using it regularly.He is not vaccinated against COVID. Denies headaches, cough, or shortness of breath.  **Interim History Insulin is being adjusted as he continues to have some hyperglycemia.  ID was consulted given concern for infectious etiology of his rash and excoriations.  Antibiotics were stopped and he did not spiked a temperature.  May need to reinitiate antibiotics but will hold off for now.  Rash was getting worse so we will give a dose of Solu-Medrol 125 mg x 1 and this was done on 10/30/20 and we started prednisone 40 mill daily.  We will work the patient up for vasculitis and check ANCA and ANA as well as check a hepatitis panel given his abnormal LFTs.  General surgery was consulted for punch biopsy which results are still pending.  LFTs are trending down now and liver ultrasound done and was within normal limits.  Overnight 2/24-2/25 the patient became hypoxic desaturated to the mid 80s on room air.  Chest x-ray shows a atypical pneumonia so we will retest for Covid and check a respiratory virus panel.  We will hold off on antibiotics currently given that he is afebrile now.  White blood cell count did go up but in the setting  of steroid demargination and continues to ride.  We have added breathing treatments as well as guaifenesin and Claritin valve and incentive spirometry.  If necessary and he if he worsens will start Augmentin for antibiotics but will hold off.  Ambulatory home O2 screen done today showed that the patient did  desaturate to the mid 80s and was placed on 2 L with improvement.  Not wearing supplemental oxygen via nasal cannula.  Chest x-ray today showed improvement in airspace disease and inflammatory markers are trending down.  Yesterday when he ambulated he desaturated on room air but today when he ambulated he did not desaturate.  Still has some leg swelling but his rash is overall improved.  Blood sugars are better controlled.  He is now stable for discharge and feels better.  Will need a repeat chest in 3 to 6 weeks and follow-up with PCP, dermatology, and endocrinology in outpatient setting and he understands agrees with plan of care.  Discharge Diagnoses:  Principal Problem:   DKA, type 1 ([place]) Active Problems:   Hypothyroidism (acquired)   Nausea & vomiting   Leukocytosis   Rash  Severe DKA type I, secondary to medication noncompliance -Presented with serum glucose 426, serum bicarb 7, anion gap 28, ketonuria, beta hydroxybutyrate acidgreater than 8.0, VBG pH 7.1. -Completed DKA protocol and transitionedto subcu insulin -DC isotonic bicarb drip and D5 LR -IV fluid hydration is now stopped. -Hemoglobin A1c 11.7on2/22/22 -Increased Lantus to 35 units may go to 30 8 AM and added 10 units Novolog sq TIDwm in addition to SSI and now increase the 10 units 3 times daily with meals to 12 units and if he continues to have fasting blood sugars greater than 180 -CBGs ranging from  182-315 -Diabetes Education Coordinator consulted and appreciate further evaluation and recommendations and following closely -If his blood sugars remain elevated will make further adjustments still but will need outpatient endocrinology follow-up; will discharge on 38 units of Lantus, 12 units 3 times daily with meals as well as a sensitive NovoLog signs ago  Diffuse Rash with Excoriation, unclear etiology.  Improving -Was started on broad-spectrum IV antibioticson admission and now stopped by ID -MRSANegative  on2/22/2022 -DC IV vancomycin, cefepime and Flagyl -IV Rocephin has now been stopped by infectious diseases  -CK was 37, LDH was 154, CRP was 18.0 and has now trended down to 10.6 and now further to 6.3, PCT was 0.57, LA was 1.8, and ESR was 53 slightly worsened to 70 is now trended back down to 47 -C/w IV Benadryl 25 mg q8h, -Will give IV Solumedrol 125 mg x1 and start Prednisone 40 mg po Daily and this was done and will continue daily prednisone; will write for steroid prior to discharge -Will ask General Surgery to come by in the AM and do a Punch Biopsy  -Check UDS -We will check ANCA which is negative so far and ANA testing which was negative along with syphilisand RPR which was negative as well as HIV (Negative)  -He has an area of induration on his lower back so we will obtain a CT scan to evaluate for infection -We will add mupirocin to open excoriated areas twice daily and stop hydrocortisone cream and start triamcinolone 0.1% cream -Follow-up on biopsy results by surgery and this is still pending and so will need to be followed as an outpatient -And also added hydroxyzine 25 mg p.o. 3 times daily as needed itching -We will need a dermatology evaluation at discharge and will give  the patient referral number to call  Abnormal LFTs, improving -His AST went from 34 and trended up to 355 and ALT went from 15 and is now 134; now AST and ALT have improved and AST is now 25, ALT is now 65 -Acute hepatitis panel was negative and right upper quadrant ultrasound was within normal limits -Continue monitor and trend hepatic function panel -Repeat CMP within 1 week   Acquired Hypothyroidism due to history of Hashimoto's disease -TSH was 56.769 -Obtained free T4 and was normal at 0.69 -Resume home levothyroxine at 150 mcg po Daily  -Recheck TFTs in 4-6 weeks   Low back pain, in the setting of his indurated lesion -Has a pain where his induration is and the site today improved and actually  burst.  Had some purulent drainage -Lower back CT scan done and showed "Possible soft tissue inflammatory change posterior to the S1 level within the subcutaneous fat. No evidence of discrete drainable abscess. No evidence of osteomyelitis. 2. Mild bulging of the disc at L2-3 more towards the left. No apparent neural compression." -Will add K Pad   -Continue with mupirocin at discharge -Follow-up with general surgery in the outpatient setting if necessary; biopsy results needed to be done  Hypokalemia -Potassium was 3.1 this morning and will replete with p.o. potassium chloride 40 40 mg twice daily x2 doses  -DC isotonic bicarb which can also contribute -Continue to monitor and replete as necessary -Repeat CMP as an outpatient  Atypical Right Sided Pneumonia  Acute respiratory failure with hypoxia in the setting of the atypical pneumonia, improved -Urine negative for pyuria or protein but did show Rare Bacteria  0-5 RBC/HPF, 0-5 WBC -Blood culture negative to date. -No evidence of pneumonia on chest x-ray initially but repeat chest x-ray 11/01/20 showed "Areas of airspace opacity bilaterally, most notable in the left base. No appreciable consolidation. Question atypical organism pneumonia. Correlation with COVID-19 status advised in this regard. Heart upper normal in size. No adenopathy appreciable"  -Repeat CXR 11/02/20 showed "Improving bibasilar atelectasis. Persistent right hilar opacity with associated right-sided volume loss. Follow-up chest radiograph is recommended in 3-4 weeks to document resolution. If persistent, dedicated CT examination of the chest with contrast is recommended to exclude a central obstructing lesion." -Procalcitonin has actually trended down from 0.57 is now 0.28 yesterday and today 0.25 -Lactic acid level was slightly elevated 2.6 and is now 1.6 -Given another 1 L bolus yesterday -SpO2: 95 % O2 Flow Rate (L/min): 2 L/min -WBC had trended down and now trending  back up in the setting of steroid demargination -Was on IV Ceftriaxone but stopped by ID -CK was 37, LDH was 154, CRP was 18.0, PCT was 0.57, LA was 1.8, and ESR was 53 -May need to reinitiate antibiotics but will continue to monitor and treat temperature but he has been afebrile -WBC went from 23.0 -> 13.9 -> 16.9 and resolved to 9.9 and started wr 13.4 yesterday and today is 16.2 in the setting of steroids -Continue guaifenesin 1200 mg p.o. twice daily, flutter valve, incentive spirometry as well as DuoNebs -Holding off on antibiotics for now; ID evaluated and feel that his presentation is not consistent with bacterial pneumonia and would favor holding off antibiotics but if it is felt that needs to be treated would be reasonable to provide a short course of Augmentin at discharge but will not send any Abx given his improvement  -Repeating Covid testing and respiratory virus panel and this was negative -Ambulatory home O2  screen done and he did desaturate yesterday; reordered today and he did not desaturate and had saturation in the 90 to 94% ambulating   Normocytic Anemia -Patient's Hgb/Hct went from 15.0/44.7 (Hemoconcentrated on Admission) and went to 11.4/32.6 and yesterday it was 12.2/35.5 -> 11.6/33.9 -> 11.2/33.1 -Check Anemia Panel in the AM iron level was 18, TIBC 165, TIBC was 183, saturation ratios were 10%, ferritin level was 458, folate level was 12.3, and vitamin B12 level 2301 -Continue to Monitor for S/Sx of Bleeding; Currently no overt bleeding noted -Repeat CBC within 1 week    Thrombocytosis -In the setting of Above -Platelet Count went from 534 -> 400 -> 425 ->325 -> 379 -> 494 -> 470 -Continue to Monitor and Trend -Repeat CBC in the AM  HypoNatremia (suspect PseudoHyponatremia) -Patient's Na+ is 140 and improved -Continue to Monitor and Trend -Status post 3 L of normal saline boluses over the 7 days -Repeat CMP as an outpatient   Metabolic Acidosis -Mild and  resolved -Patient's CO2 i is now improved with a CO2 of 27, anion gap of 11, chloride level 102 -Continue to Monitor and Trend -Repeat CMP in the AM   Hyperbilirubinemia -Patient's T Bili was 2.9 (Indirect Bilirubin 2.4 and Direct was 0.5) and is now improved and normalized to 0.7 -Obtaining right upper quadrant ultrasound and was normal as above -Continue to Monitor and Trend -Repeat CMP as an outpatient   Hypophosphatemia -Patient's phosphorus level was 2.2 and improved to 4.1 -replete with p.o. K-Phos Neutral 500 mg p.o. twice daily x2 doses yesterday -Continue to monitor and replete as necessary -Repeat phosphorus level as an outpatient   Discharge Instructions  Discharge Instructions    Call MD for:  difficulty breathing, headache or visual disturbances   Complete by: As directed    Call MD for:  extreme fatigue   Complete by: As directed    Call MD for:  hives   Complete by: As directed    Call MD for:  persistant dizziness or light-headedness   Complete by: As directed    Call MD for:  persistant nausea and vomiting   Complete by: As directed    Call MD for:  redness, tenderness, or signs of infection (pain, swelling, redness, odor or green/yellow discharge around incision site)   Complete by: As directed    Call MD for:  severe uncontrolled pain   Complete by: As directed    Call MD for:  temperature >100.4   Complete by: As directed    Diet - low sodium heart healthy   Complete by: As directed    Discharge instructions   Complete by: As directed    You were cared for by a hospitalist during your hospital stay. If you have any questions about your discharge medications or the care you received while you were in the hospital after you are discharged, you can call the unit and ask to speak with the hospitalist on call if the hospitalist that took care of you is not available. Once you are discharged, your primary care physician will handle any further medical issues.  Please note that NO REFILLS for any discharge medications will be authorized once you are discharged, as it is imperative that you return to your primary care physician (or establish a relationship with a primary care physician if you do not have one) for your aftercare needs so that they can reassess your need for medications and monitor your lab values.  Follow up and establish  with PCP, Dermatology and Endocrinology. Take all medications as prescribed. If symptoms change or worsen please return to the ED for evaluation   Discharge wound care:   Complete by: As directed    Wound care to lesions on trunk and extremities (full thickness, etiology not known):  Cleanse with soap and water, rinse and pat dry. Cover with size appropriate piece of xeroform gauze, top with dry gauze 2x2 and cover with silicone foam. Change xeroform and dry gauze topper daily and change silicone foam every three days and PRN.   Increase activity slowly   Complete by: As directed      Allergies as of 11/03/2020      Reactions   Ibuprofen Other (See Comments)   WANT TO AVOID LIVER FAILURE, SO PATIENT WAS TOLD NOT TO TAKE      Medication List    STOP taking these medications   insulin glargine 100 UNIT/ML injection Commonly known as: LANTUS Replaced by: insulin glargine 100 UNIT/ML Solostar Pen   insulin lispro 100 UNIT/ML injection Commonly known as: HUMALOG     TAKE these medications   (feeding supplement) PROSource Plus liquid Take 30 mLs by mouth 2 (two) times daily between meals.   feeding supplement (GLUCERNA SHAKE) Liqd Take 237 mLs by mouth 2 (two) times daily between meals.   blood glucose meter kit and supplies Kit Dispense based on patient and insurance preference. Use up to four times daily as directed.   FLUoxetine 20 MG capsule Commonly known as: PROZAC Take 1 capsule (20 mg total) by mouth daily.   gabapentin 100 MG capsule Commonly known as: NEURONTIN Take 1 capsule (100 mg total) by  mouth 2 (two) times daily. What changed:   medication strength  how much to take  when to take this   guaiFENesin 600 MG 12 hr tablet Commonly known as: MUCINEX Take 1 tablet (600 mg total) by mouth 2 (two) times daily for 5 days.   Hibiclens 4 % external liquid Generic drug: chlorhexidine Apply topically daily as needed. Dilute 10-15 mL in water, Use daily when bathing for 1-2 weeks   hydrOXYzine 25 MG tablet Commonly known as: ATARAX/VISTARIL Take 1 tablet (25 mg total) by mouth 3 (three) times daily as needed for itching.   insulin aspart 100 UNIT/ML FlexPen Commonly known as: NOVOLOG Inject 12 Units into the skin 3 (three) times daily with meals.   insulin aspart 100 UNIT/ML FlexPen Commonly known as: NOVOLOG Inject 0-9 Units into the skin 3 (three) times daily with meals. CBG 70 - 120: 0 units CBG 121 - 150: 0 units CBG 151 - 200: 0 units CBG 201 - 250: 2 units CBG 251 - 300: 3 units CBG 301 - 350: 4 units CBG 351 - 400: 5 units CBG > 400: call MD and obtain STAT lab verification   insulin glargine 100 UNIT/ML Solostar Pen Commonly known as: LANTUS Inject 38 Units into the skin daily. Replaces: insulin glargine 100 UNIT/ML injection   levothyroxine 150 MCG tablet Commonly known as: SYNTHROID Take 1 tablet (150 mcg total) by mouth daily.   multivitamin with minerals Tabs tablet Take 1 tablet by mouth daily. Start taking on: November 04, 2020   mupirocin cream 2 % Commonly known as: BACTROBAN Apply topically 2 (two) times daily.   oxyCODONE 5 MG immediate release tablet Commonly known as: Oxy IR/ROXICODONE Take 1 tablet (5 mg total) by mouth every 6 (six) hours as needed for moderate pain or breakthrough pain.  Pen Needles 3/16" 31G X 5 MM Misc 1 Container by Does not apply route 4 (four) times daily -  before meals and at bedtime.   predniSONE 10 MG (21) Tbpk tablet Commonly known as: STERAPRED UNI-PAK 21 TAB Take 6 pills on Day 1, 5 pills on day 2,  4 pills on day 3, 3 pills on day 4, 2 pills on day 5, 1 pill on day 6 and then stop on day 7   senna 8.6 MG Tabs tablet Commonly known as: SENOKOT Take 1 tablet (8.6 mg total) by mouth at bedtime.   triamcinolone 0.1 % Commonly known as: KENALOG Apply topically 2 (two) times daily.            Discharge Care Instructions  (From admission, onward)         Start     Ordered   11/03/20 0000  Discharge wound care:       Comments: Wound care to lesions on trunk and extremities (full thickness, etiology not known):  Cleanse with soap and water, rinse and pat dry. Cover with size appropriate piece of xeroform gauze, top with dry gauze 2x2 and cover with silicone foam. Change xeroform and dry gauze topper daily and change silicone foam every three days and PRN.   11/03/20 1417          Follow-up Information    Delrae Rend, MD. Call.   Specialty: Endocrinology Why: Follow up for Endocrinology Visit  Contact information: [address] [place] [address] [phone]        Specialists, Dermatology. Call.   Specialty: Dermatology Why: Follow up for Rash; Follow up on Skin Biopsy Results Contact information: [address] [place] [address] [phone]              Allergies  Allergen Reactions  . Ibuprofen Other (See Comments)    WANT TO AVOID LIVER FAILURE, SO PATIENT WAS TOLD NOT TO TAKE    Consultations:  Infectious diseases  General surgery  Procedures/Studies: CT LUMBAR SPINE WO CONTRAST  Result Date: 10/31/2020 CLINICAL DATA:  Low back pain. Overlying skin infection. Assess for spinal infection. EXAM: CT LUMBAR SPINE WITHOUT CONTRAST TECHNIQUE: Multidetector CT imaging of the lumbar spine was performed without intravenous contrast administration. Multiplanar CT image reconstructions were also generated. COMPARISON:  None. FINDINGS: Segmentation: 5 lumbar type vertebral bodies. Alignment: Normal Vertebrae: No focal bone  lesion. No finding to suggest osteomyelitis. Paraspinal and other soft tissues: Possible soft tissue inflammatory change posterior to the S1 level within the subcutaneous fat. No evidence of discrete drainable abscess. Disc levels: Normal except for evidence of mild bulging of the disc at the L2-3 level, more towards the left. No apparent neural compression. IMPRESSION: 1. Possible soft tissue inflammatory change posterior to the S1 level within the subcutaneous fat. No evidence of discrete drainable abscess. No evidence of osteomyelitis. 2. Mild bulging of the disc at L2-3 more towards the left. No apparent neural compression. Electronically Signed   By: Nelson Chimes M.D.   On: 10/31/2020 19:03   DG CHEST PORT 1 VIEW  Result Date: 11/03/2020 CLINICAL DATA:  Dyspnea EXAM: PORTABLE CHEST 1 VIEW COMPARISON:  11/02/2020 FINDINGS: Right perihilar pulmonary opacity continues to improve. Minimal infiltrate persists with peribronchial thickening identified. Stable right-sided volume loss. Left lung is clear. No pneumothorax or pleural effusion. Cardiac size within normal limits. IMPRESSION: Improving right perihilar opacity likely representing asymmetric perihilar pulmonary infiltrate. Persistent mild right-sided volume loss.  Electronically Signed   By: Fidela Salisbury MD   On: 11/03/2020 06:37   DG CHEST PORT 1 VIEW  Result Date: 11/02/2020 CLINICAL DATA:  Dyspnea EXAM: PORTABLE CHEST 1 VIEW COMPARISON:  11/01/2020 FINDINGS: Bibasilar pulmonary opacities have improved likely reflecting improving bibasilar atelectasis. Asymmetric right perihilar opacity persists possibly related to focal infiltrate, mass or adenopathy in this region. Associated right-sided volume loss is again seen. No pneumothorax or pleural effusion. Cardiac size within normal limits. Pulmonary vascularity is normal. IMPRESSION: Improving bibasilar atelectasis. Persistent right hilar opacity with associated right-sided volume loss. Follow-up chest  radiograph is recommended in 3-4 weeks to document resolution. If persistent, dedicated CT examination of the chest with contrast is recommended to exclude a central obstructing lesion. Electronically Signed   By: Fidela Salisbury MD   On: 11/02/2020 06:31   DG CHEST PORT 1 VIEW  Result Date: 11/01/2020 CLINICAL DATA:  Hypoxia EXAM: PORTABLE CHEST 1 VIEW COMPARISON:  October 28, 2020 FINDINGS: Areas of ill-defined airspace opacity are noted in the mid and lower lung regions bilaterally. No frank consolidation. Heart is upper normal in size with pulmonary vascularity normal. No adenopathy. No bone lesions. IMPRESSION: Areas of airspace opacity bilaterally, most notable in the left base. No appreciable consolidation. Question atypical organism pneumonia. Correlation with COVID-19 status advised in this regard. Heart upper normal in size. No adenopathy appreciable. Electronically Signed   By: Lowella Grip III M.D.   On: 11/01/2020 08:43   DG Chest Portable 1 View  Result Date: 10/28/2020 CLINICAL DATA:  Leukocytosis EXAM: PORTABLE CHEST 1 VIEW COMPARISON:  None. FINDINGS: Lungs are clear. Heart size and pulmonary vascularity are normal. No adenopathy. No bone lesions. IMPRESSION: Lungs clear.  Cardiac silhouette normal. Electronically Signed   By: Lowella Grip III M.D.   On: 10/28/2020 21:09   US Abdomen Limited RUQ (LIVER/GB)  Result Date: 11/01/2020 CLINICAL DATA:  Elevated liver enzymes EXAM: ULTRASOUND ABDOMEN LIMITED RIGHT UPPER QUADRANT COMPARISON:  February 17, 2017 FINDINGS: Gallbladder: No gallstones or wall thickening visualized. There is no pericholecystic fluid. No sonographic Murphy sign noted by sonographer. Common bile duct: Diameter: 4 mm. No intrahepatic or extrahepatic biliary duct dilatation. Liver: No focal lesion identified. Within normal limits in parenchymal echogenicity. Portal vein is patent on color Doppler imaging with normal direction of blood flow towards the liver. Other:  None. IMPRESSION: Study within normal limits. Electronically Signed   By: Lowella Grip III M.D.   On: 11/01/2020 08:42     Subjective: Seen and examined at bedside he is doing much better today than he was last few days.  Rash is almost completely gone.  Continues to have some lower extremity swelling.  No nausea or vomiting.  Ambulated without issues.  Back is not hurting as much.  No other concerns or plans at this time is ready to go home.  Discharge Exam: Vitals:   11/03/20 1220 11/03/20 1300  BP: (!) 161/104 (!) 153/97  Pulse: 79   Resp: (!) 22 19  Temp: 98 F (36.7 C)   SpO2: 93%    Vitals:   11/02/20 2040 11/03/20 0556 11/03/20 1220 11/03/20 1300  BP: (!) 157/92 135/89 (!) 161/104 (!) 153/97  Pulse: 77 77 79   Resp: 18 20 (!) 22 19  Temp: 98.2 F (36.8 C)  98 F (36.7 C)   TempSrc: Oral Oral Oral   SpO2: 92% 90% 93%   Weight:      Height:  General: Pt is alert, awake, not in acute distress Cardiovascular: RRR, S1/S2 +, no rubs, no gallops Respiratory: Diminished bilaterally with coarse breath sounds, no wheezing, no rhonchi; unlabored breathing and not wearing supplemental oxygen via nasal cannula Abdominal: Soft, NT, slightly distended, bowel sounds + Extremities: Has 1+ leg edema , no cyanosis; rash is much improved from admission  The results of significant diagnostics from this hospitalization (including imaging, microbiology, ancillary and laboratory) are listed below for reference.    Microbiology: Recent Results (from the past 240 hour(s))  Culture, blood (routine x 2)     Status: None   Collection Time: 10/28/20  8:30 PM   Specimen: BLOOD  Result Value Ref Range Status   Specimen Description   Final    BLOOD RIGHT WRIST Performed at [facility] [address], [place], [place] [address]    Special Requests   Final    BOTTLES DRAWN AEROBIC AND ANAEROBIC Blood Culture adequate volume Performed at [facility] [address], [place], [place] [address]    Culture   Final    NO GROWTH 5 DAYS Performed at [facility], [place] [address] [place], [place] [address]    Report Status 11/02/2020 FINAL  Final  Culture, blood (routine x 2)     Status: None   Collection Time: 10/28/20  8:30 PM   Specimen: BLOOD RIGHT FOREARM  Result Value Ref Range Status   Specimen Description   Final    BLOOD RIGHT FOREARM Performed at [facility] [address] [place], [place] [address]    Special Requests   Final    BOTTLES DRAWN AEROBIC AND ANAEROBIC Blood Culture results may not be optimal due to an inadequate volume of blood received in culture bottles Performed at [facility] [address] [place], [place] [address]    Culture   Final    NO GROWTH 5 DAYS Performed at [facility], [place] [address] [place], [place] [address]    Report Status 11/02/2020 FINAL  Final  SARS CORONAVIRUS 2 (TAT 6-24 HRS) Nasopharyngeal Nasopharyngeal Swab     Status: None   Collection Time: 10/29/20 12:21 AM   Specimen: Nasopharyngeal Swab  Result Value Ref Range Status   SARS Coronavirus 2 NEGATIVE NEGATIVE Final    Comment: (NOTE) SARS-CoV-2 target nucleic acids are NOT DETECTED.  The SARS-CoV-2 RNA is generally detectable in upper and lower respiratory specimens during the acute phase of infection. Negative results do not preclude SARS-CoV-2 infection, do not rule out co-infections with other pathogens, and should not be used as the sole basis for treatment or other patient management decisions. Negative results must be combined with clinical observations, patient history, and epidemiological information. The expected result is Negative.  Fact Sheet for Patients: SugarRoll.be  Fact Sheet for Healthcare Providers: https://www.woods-mathews.com/  This test is not yet approved or cleared by the [place] FDA  and  has been authorized for detection and/or diagnosis of SARS-CoV-2 by FDA under an Emergency Use Authorization (EUA). This EUA will remain  in effect (meaning this test can be used) for the duration of the COVID-19 declaration under Se ction 564(b)(1) of the Act, 21 U.S.C. section 360bbb-3(b)(1), unless the authorization is terminated or revoked sooner.  Performed at [facility], [place] [address] [place], [place] [address]   MRSA PCR Screening     Status: None   Collection Time: 10/29/20  1:06 AM  Specimen: Nasopharyngeal  Result Value Ref Range Status   MRSA by PCR NEGATIVE NEGATIVE Final    Comment:        The GeneXpert MRSA Assay (FDA approved for NASAL specimens only), is one component of a comprehensive MRSA colonization surveillance program. It is not intended to diagnose MRSA infection nor to guide or monitor treatment for MRSA infections. Performed at [facility], [place] [address], [place], [place] [address]   Respiratory (~20 pathogens) panel by PCR     Status: None   Collection Time: 11/01/20  2:37 PM  Result Value Ref Range Status   Adenovirus NOT DETECTED NOT DETECTED Final   Coronavirus 229E NOT DETECTED NOT DETECTED Final    Comment: (NOTE) The Coronavirus on the Respiratory Panel, DOES NOT test for the novel  Coronavirus (2019 nCoV)    Coronavirus HKU1 NOT DETECTED NOT DETECTED Final   Coronavirus NL63 NOT DETECTED NOT DETECTED Final   Coronavirus OC43 NOT DETECTED NOT DETECTED Final   Metapneumovirus NOT DETECTED NOT DETECTED Final   Rhinovirus / Enterovirus NOT DETECTED NOT DETECTED Final   Influenza A NOT DETECTED NOT DETECTED Final   Influenza B NOT DETECTED NOT DETECTED Final   Parainfluenza Virus 1 NOT DETECTED NOT DETECTED Final   Parainfluenza Virus 2 NOT DETECTED NOT DETECTED Final   Parainfluenza Virus 3 NOT DETECTED NOT DETECTED Final   Parainfluenza Virus 4 NOT DETECTED NOT DETECTED Final   Respiratory Syncytial  Virus NOT DETECTED NOT DETECTED Final   Bordetella pertussis NOT DETECTED NOT DETECTED Final   Bordetella Parapertussis NOT DETECTED NOT DETECTED Final   Chlamydophila pneumoniae NOT DETECTED NOT DETECTED Final   Mycoplasma pneumoniae NOT DETECTED NOT DETECTED Final    Comment: Performed at [facility], [place]. [address] [place], [place] [address]  Resp panel by RT-PCR (RSV, Flu A&B, Covid)     Status: None   Collection Time: 11/01/20  2:37 PM  Result Value Ref Range Status   SARS Coronavirus 2 by RT PCR NEGATIVE NEGATIVE Final    Comment: (NOTE) SARS-CoV-2 target nucleic acids are NOT DETECTED.  The SARS-CoV-2 RNA is generally detectable in upper respiratory specimens during the acute phase of infection. The lowest concentration of SARS-CoV-2 viral copies this assay can detect is 138 copies/mL. A negative result does not preclude SARS-Cov-2 infection and should not be used as the sole basis for treatment or other patient management decisions. A negative result may occur with  improper specimen collection/handling, submission of specimen other than nasopharyngeal swab, presence of viral mutation(s) within the areas targeted by this assay, and inadequate number of viral copies(<138 copies/mL). A negative result must be combined with clinical observations, patient history, and epidemiological information. The expected result is Negative.  Fact Sheet for Patients:  EntrepreneurPulse.com.au  Fact Sheet for Healthcare Providers:  IncredibleEmployment.be  This test is no t yet approved or cleared by the [place] FDA and  has been authorized for detection and/or diagnosis of SARS-CoV-2 by FDA under an Emergency Use Authorization (EUA). This EUA will remain  in effect (meaning this test can be used) for the duration of the COVID-19 declaration under Section 564(b)(1) of the Act, 21 U.S.C.section 360bbb-3(b)(1), unless the authorization is  terminated  or revoked sooner.       Influenza A by PCR NEGATIVE NEGATIVE Final   Influenza B by PCR NEGATIVE NEGATIVE Final    Comment: (NOTE) The Xpert Xpress SARS-CoV-2/FLU/RSV plus assay is intended as an aid in the  diagnosis of influenza from Nasopharyngeal swab specimens and should not be used as a sole basis for treatment. Nasal washings and aspirates are unacceptable for Xpert Xpress SARS-CoV-2/FLU/RSV testing.  Fact Sheet for Patients: EntrepreneurPulse.com.au  Fact Sheet for Healthcare Providers: IncredibleEmployment.be  This test is not yet approved or cleared by the [place] FDA and has been authorized for detection and/or diagnosis of SARS-CoV-2 by FDA under an Emergency Use Authorization (EUA). This EUA will remain in effect (meaning this test can be used) for the duration of the COVID-19 declaration under Section 564(b)(1) of the Act, 21 U.S.C. section 360bbb-3(b)(1), unless the authorization is terminated or revoked.     Resp Syncytial Virus by PCR NEGATIVE NEGATIVE Final    Comment: (NOTE) Fact Sheet for Patients: EntrepreneurPulse.com.au  Fact Sheet for Healthcare Providers: IncredibleEmployment.be  This test is not yet approved or cleared by the [place] FDA and has been authorized for detection and/or diagnosis of SARS-CoV-2 by FDA under an Emergency Use Authorization (EUA). This EUA will remain in effect (meaning this test can be used) for the duration of the COVID-19 declaration under Section 564(b)(1) of the Act, 21 U.S.C. section 360bbb-3(b)(1), unless the authorization is terminated or revoked.  Performed at [facility], [place] [address], [place], [place] [address]     Labs: BNP (last 3 results) No results for input(s): BNP in the last 8760 hours. Basic Metabolic Panel: Recent Labs  Lab 10/29/20 0303 10/29/20 0524 10/30/20 0520 10/31/20 0458  11/01/20 0504 11/02/20 0416 11/03/20 0507  NA 132*   < > 133* 134* 138 138 140  K 3.1*   < > 3.7 4.4 3.6 3.1* 3.1*  CL 94*   < > 97* 99 100 100 102  CO2 24   < > 21* _0 GLUCOSE 603*   < > 364* 520* 386* 264* 253*  BUN 15   < > _1 CREATININE 1.04   < > 1.19 1.15 0.92 0.89 0.91  CALCIUM 7.8*   < > 8.7* 8.7* 8.7* 8.4* 8.2*  MG 2.3  --   --  2.0 2.2 2.2 2.1  PHOS  --   --   --  1.9* 2.7 2.2* 4.1   < > = values in this interval not displayed.   Liver Function Tests: Recent Labs  Lab 10/28/20 2343 10/31/20 0458 11/01/20 0504 11/02/20 0416 11/03/20 0507  AST 34 355* 145* 30 25  ALT 15 174* 150* 97* 65*  ALKPHOS 112 224* 198* 163* 129*  BILITOT 2.9* 1.2 0.6 0.7 0.7  PROT 7.3 6.3* 6.0* 5.9* 5.4*  ALBUMIN 3.6 3.0* 2.6* 2.7* 2.3*   Recent Labs  Lab 10/28/20 2343  LIPASE 20   No results for input(s): AMMONIA in the last 168 hours. CBC: Recent Labs  Lab 10/30/20 0520 10/31/20 0458 11/01/20 0504 11/02/20 0416 11/03/20 0507  WBC 16.9* 9.9 13.4* 16.2* 11.4*  NEUTROABS  --  9.3* 10.6* 11.2* 6.0  HGB 12.2* 11.6* 11.0* 11.6* 11.2*  HCT 35.5* 33.9* 31.7* 34.7* 33.1*  MCV 88.5 89.4 88.3 90.1 90.4  PLT 425* 325 379 494* 470*   Cardiac Enzymes: Recent Labs  Lab 10/29/20 0524  CKTOTAL 37*   BNP: Invalid input(s): POCBNP CBG: Recent Labs  Lab 11/02/20 1152 11/02/20 1605 11/02/20 2036 11/03/20 0804 11/03/20 1152  GLUCAP 258* 152* 136* 315* 182*   D-Dimer No results for input(s): DDIMER in the last 72 hours. Hgb A1c No results for input(s):  HGBA1C in the last 72 hours. Lipid Profile No results for input(s): CHOL, HDL, LDLCALC, TRIG, CHOLHDL, LDLDIRECT in the last 72 hours. Thyroid function studies No results for input(s): TSH, T4TOTAL, T3FREE, THYROIDAB in the last 72 hours.  Invalid input(s): FREET3 Anemia work up No results for input(s): VITAMINB12, FOLATE, FERRITIN, TIBC, IRON, RETICCTPCT in the last 72 hours. Urinalysis    Component  Value Date/Time   COLORURINE STRAW (A) 10/28/2020 1827   APPEARANCEUR CLEAR 10/28/2020 1827   LABSPEC 1.012 10/28/2020 1827   PHURINE 5.0 10/28/2020 1827   GLUCOSEU >=500 (A) 10/28/2020 1827   HGBUR MODERATE (A) 10/28/2020 1827   BILIRUBINUR NEGATIVE 10/28/2020 1827   KETONESUR 80 (A) 10/28/2020 1827   PROTEINUR NEGATIVE 10/28/2020 1827   NITRITE NEGATIVE 10/28/2020 1827   LEUKOCYTESUR NEGATIVE 10/28/2020 1827   Sepsis Labs Invalid input(s): PROCALCITONIN,  WBC,  LACTICIDVEN Microbiology Recent Results (from the past 240 hour(s))  Culture, blood (routine x 2)     Status: None   Collection Time: 10/28/20  8:30 PM   Specimen: BLOOD  Result Value Ref Range Status   Specimen Description   Final    BLOOD RIGHT WRIST Performed at [facility], [place] [address], [place], [place] [address]    Special Requests   Final    BOTTLES DRAWN AEROBIC AND ANAEROBIC Blood Culture adequate volume Performed at [facility] [address], [place], [place] [address]    Culture   Final    NO GROWTH 5 DAYS Performed at [facility], [place] [address], [place], [place] [address]    Report Status 11/02/2020 FINAL  Final  Culture, blood (routine x 2)     Status: None   Collection Time: 10/28/20  8:30 PM   Specimen: BLOOD RIGHT FOREARM  Result Value Ref Range Status   Specimen Description   Final    BLOOD RIGHT FOREARM Performed at [facility] [address] [place], [place] [address]    Special Requests   Final    BOTTLES DRAWN AEROBIC AND ANAEROBIC Blood Culture results may not be optimal due to an inadequate volume of blood received in culture bottles Performed at [facility] [address] [place], [place] [address]    Culture   Final    NO GROWTH 5 DAYS Performed at [facility], [place] [address] [place],  [place] [address]    Report Status 11/02/2020 FINAL  Final  SARS CORONAVIRUS 2 (TAT 6-24 HRS)  Nasopharyngeal Nasopharyngeal Swab     Status: None   Collection Time: 10/29/20 12:21 AM   Specimen: Nasopharyngeal Swab  Result Value Ref Range Status   SARS Coronavirus 2 NEGATIVE NEGATIVE Final    Comment: (NOTE) SARS-CoV-2 target nucleic acids are NOT DETECTED.  The SARS-CoV-2 RNA is generally detectable in upper and lower respiratory specimens during the acute phase of infection. Negative results do not preclude SARS-CoV-2 infection, do not rule out co-infections with other pathogens, and should not be used as the sole basis for treatment or other patient management decisions. Negative results must be combined with clinical observations, patient history, and epidemiological information. The expected result is Negative.  Fact Sheet for Patients: SugarRoll.be  Fact Sheet for Healthcare Providers: https://www.woods-mathews.com/  This test is not yet approved or cleared by the [place] FDA and  has been authorized for detection and/or diagnosis of SARS-CoV-2 by FDA under an Emergency Use Authorization (EUA). This EUA will remain  in effect (  meaning this test can be used) for the duration of the COVID-19 declaration under Se ction 564(b)(1) of the Act, 21 U.S.C. section 360bbb-3(b)(1), unless the authorization is terminated or revoked sooner.  Performed at [facility], [place] [address] [place], [place] [address]   MRSA PCR Screening     Status: None   Collection Time: 10/29/20  1:06 AM   Specimen: Nasopharyngeal  Result Value Ref Range Status   MRSA by PCR NEGATIVE NEGATIVE Final    Comment:        The GeneXpert MRSA Assay (FDA approved for NASAL specimens only), is one component of a comprehensive MRSA colonization surveillance program. It is not intended to diagnose MRSA infection nor to guide or monitor treatment for MRSA infections. Performed at [facility], [place] [address], [place], [place]  [address]   Respiratory (~20 pathogens) panel by PCR     Status: None   Collection Time: 11/01/20  2:37 PM  Result Value Ref Range Status   Adenovirus NOT DETECTED NOT DETECTED Final   Coronavirus 229E NOT DETECTED NOT DETECTED Final    Comment: (NOTE) The Coronavirus on the Respiratory Panel, DOES NOT test for the novel  Coronavirus (2019 nCoV)    Coronavirus HKU1 NOT DETECTED NOT DETECTED Final   Coronavirus NL63 NOT DETECTED NOT DETECTED Final   Coronavirus OC43 NOT DETECTED NOT DETECTED Final   Metapneumovirus NOT DETECTED NOT DETECTED Final   Rhinovirus / Enterovirus NOT DETECTED NOT DETECTED Final   Influenza A NOT DETECTED NOT DETECTED Final   Influenza B NOT DETECTED NOT DETECTED Final   Parainfluenza Virus 1 NOT DETECTED NOT DETECTED Final   Parainfluenza Virus 2 NOT DETECTED NOT DETECTED Final   Parainfluenza Virus 3 NOT DETECTED NOT DETECTED Final   Parainfluenza Virus 4 NOT DETECTED NOT DETECTED Final   Respiratory Syncytial Virus NOT DETECTED NOT DETECTED Final   Bordetella pertussis NOT DETECTED NOT DETECTED Final   Bordetella Parapertussis NOT DETECTED NOT DETECTED Final   Chlamydophila pneumoniae NOT DETECTED NOT DETECTED Final   Mycoplasma pneumoniae NOT DETECTED NOT DETECTED Final    Comment: Performed at [facility], [place]. [address], [place], [place] [address]  Resp panel by RT-PCR (RSV, Flu A&B, Covid)     Status: None   Collection Time: 11/01/20  2:37 PM  Result Value Ref Range Status   SARS Coronavirus 2 by RT PCR NEGATIVE NEGATIVE Final    Comment: (NOTE) SARS-CoV-2 target nucleic acids are NOT DETECTED.  The SARS-CoV-2 RNA is generally detectable in upper respiratory specimens during the acute phase of infection. The lowest concentration of SARS-CoV-2 viral copies this assay can detect is 138 copies/mL. A negative result does not preclude SARS-Cov-2 infection and should not be used as the sole basis for treatment or other patient management decisions.  A negative result may occur with  improper specimen collection/handling, submission of specimen other than nasopharyngeal swab, presence of viral mutation(s) within the areas targeted by this assay, and inadequate number of viral copies(<138 copies/mL). A negative result must be combined with clinical observations, patient history, and epidemiological information. The expected result is Negative.  Fact Sheet for Patients:  EntrepreneurPulse.com.au  Fact Sheet for Healthcare Providers:  IncredibleEmployment.be  This test is no t yet approved or cleared by the [place] FDA and  has been authorized for detection and/or diagnosis of SARS-CoV-2 by FDA under an Emergency Use Authorization (EUA). This EUA will remain  in effect (meaning this test can  be used) for the duration of the COVID-19 declaration under Section 564(b)(1) of the Act, 21 U.S.C.section 360bbb-3(b)(1), unless the authorization is terminated  or revoked sooner.       Influenza A by PCR NEGATIVE NEGATIVE Final   Influenza B by PCR NEGATIVE NEGATIVE Final    Comment: (NOTE) The Xpert Xpress SARS-CoV-2/FLU/RSV plus assay is intended as an aid in the diagnosis of influenza from Nasopharyngeal swab specimens and should not be used as a sole basis for treatment. Nasal washings and aspirates are unacceptable for Xpert Xpress SARS-CoV-2/FLU/RSV testing.  Fact Sheet for Patients: EntrepreneurPulse.com.au  Fact Sheet for Healthcare Providers: IncredibleEmployment.be  This test is not yet approved or cleared by the [place] FDA and has been authorized for detection and/or diagnosis of SARS-CoV-2 by FDA under an Emergency Use Authorization (EUA). This EUA will remain in effect (meaning this test can be used) for the duration of the COVID-19 declaration under Section 564(b)(1) of the Act, 21 U.S.C. section 360bbb-3(b)(1), unless the authorization  is terminated or revoked.     Resp Syncytial Virus by PCR NEGATIVE NEGATIVE Final    Comment: (NOTE) Fact Sheet for Patients: EntrepreneurPulse.com.au  Fact Sheet for Healthcare Providers: IncredibleEmployment.be  This test is not yet approved or cleared by the [place] FDA and has been authorized for detection and/or diagnosis of SARS-CoV-2 by FDA under an Emergency Use Authorization (EUA). This EUA will remain in effect (meaning this test can be used) for the duration of the COVID-19 declaration under Section 564(b)(1) of the Act, 21 U.S.C. section 360bbb-3(b)(1), unless the authorization is terminated or revoked.  Performed at [facility], [place] [address] [place], [place] [address]    Time coordinating discharge: 35 minutes  SIGNED:  Kerney Elbe, DO [facility] 11/03/2020, 4:51 PM Pager is on [place]  If 7PM-7AM, please contact night-coverage [email or web address]

## 2020-11-03 NOTE — Plan of Care (Signed)

## 2020-11-03 NOTE — Progress Notes (Signed)
Patient discharged home, discharge instructions given and explained to patient and he verbalized understanding. Patient denies any distress, Accompanied home by brother.

## 2020-11-04 LAB — SURGICAL PATHOLOGY

## 2020-12-21 DIAGNOSIS — F32A Depression, unspecified: Secondary | ICD-10-CM | POA: Diagnosis not present

## 2020-12-29 DIAGNOSIS — F419 Anxiety disorder, unspecified: Secondary | ICD-10-CM | POA: Diagnosis not present

## 2020-12-29 DIAGNOSIS — F32A Depression, unspecified: Secondary | ICD-10-CM | POA: Diagnosis not present

## 2021-06-28 ENCOUNTER — Telehealth: Payer: BC Managed Care – PPO | Admitting: Nurse Practitioner

## 2021-06-28 DIAGNOSIS — M545 Low back pain, unspecified: Secondary | ICD-10-CM

## 2021-06-28 DIAGNOSIS — R399 Unspecified symptoms and signs involving the genitourinary system: Secondary | ICD-10-CM

## 2021-06-28 NOTE — Progress Notes (Signed)
Based on what you shared with me it looks like you have back pain,that should be evaluated in a face to face office visit. Mid to upper back pain should not radiate into your legs. You also c/o difficult urination which needs to be evaluated with urinalysis and urine culture. Also the numbness could be a concern. You will need a face to face visit for proper diagnosis and treatment  NOTE: There will be NO CHARGE for this eVisit   If you are having a true medical emergency please call 911.      For an urgent face to face visit, [place] has six urgent care centers for your convenience:     [facility] Health Urgent Care Center at [facility] Directions [phone] [address] [place], [place] [address]    [facility] Health Urgent Care Center [facility]) Get Driving Directions [phone] [address] [place], [place] [address]  [facility] Health Urgent Care Center [facility]) Get Driving Directions [phone] [address] [place],  [place]  [address]  [facility] Health Urgent Care at [facility] Get Driving Directions [phone] [address] [place], [place] [address]   [facility] Health Urgent Care at [facility] Get Driving Directions  [phone] [address] [place], [place] [address]   [facility] Health Urgent Care at [facility] Directions [phone] [address], [address] [place], [place] [address]  Your MyChart E-visit questionnaire answers were reviewed by a board certified advanced clinical practitioner to complete your personal care plan based on your specific symptoms.  Thank you for using e-Visits.

## 2021-07-08 DIAGNOSIS — E1065 Type 1 diabetes mellitus with hyperglycemia: Secondary | ICD-10-CM | POA: Diagnosis not present

## 2021-07-08 DIAGNOSIS — R509 Fever, unspecified: Secondary | ICD-10-CM | POA: Diagnosis not present

## 2021-07-08 DIAGNOSIS — J029 Acute pharyngitis, unspecified: Secondary | ICD-10-CM | POA: Diagnosis not present

## 2021-07-08 DIAGNOSIS — R051 Acute cough: Secondary | ICD-10-CM | POA: Diagnosis not present

## 2021-07-08 DIAGNOSIS — Z20822 Contact with and (suspected) exposure to covid-19: Secondary | ICD-10-CM | POA: Diagnosis not present

## 2021-08-15 ENCOUNTER — Emergency Department (HOSPITAL_COMMUNITY)
Admission: EM | Admit: 2021-08-15 | Discharge: 2021-08-15 | Disposition: A | Discharge status: Home / Self Care | Payer: BC Managed Care – PPO | Attending: Emergency Medicine | Admitting: Emergency Medicine

## 2021-08-15 ENCOUNTER — Encounter (HOSPITAL_COMMUNITY): Payer: Self-pay | Admitting: Emergency Medicine

## 2021-08-15 DIAGNOSIS — H5712 Ocular pain, left eye: Secondary | ICD-10-CM | POA: Diagnosis not present

## 2021-08-15 DIAGNOSIS — Z79899 Other long term (current) drug therapy: Secondary | ICD-10-CM | POA: Insufficient documentation

## 2021-08-15 DIAGNOSIS — Z87891 Personal history of nicotine dependence: Secondary | ICD-10-CM | POA: Insufficient documentation

## 2021-08-15 DIAGNOSIS — E039 Hypothyroidism, unspecified: Secondary | ICD-10-CM | POA: Diagnosis not present

## 2021-08-15 DIAGNOSIS — Z794 Long term (current) use of insulin: Secondary | ICD-10-CM | POA: Diagnosis not present

## 2021-08-15 DIAGNOSIS — E1010 Type 1 diabetes mellitus with ketoacidosis without coma: Secondary | ICD-10-CM | POA: Diagnosis not present

## 2021-08-15 LAB — I-STAT CHEM 8, ED
BUN: 21 mg/dL — ABNORMAL HIGH (ref 6–20)
Calcium, Ion: 1.21 mmol/L (ref 1.15–1.40)
Chloride: 97 mmol/L — ABNORMAL LOW (ref 98–111)
Creatinine, Ser: 1.2 mg/dL (ref 0.61–1.24)
Glucose, Bld: 320 mg/dL — ABNORMAL HIGH (ref 70–99)
HCT: 46 % (ref 39.0–52.0)
Hemoglobin: 15.6 g/dL (ref 13.0–17.0)
Potassium: 4.1 mmol/L (ref 3.5–5.1)
Sodium: 137 mmol/L (ref 135–145)
TCO2: 30 mmol/L (ref 22–32)

## 2021-08-15 LAB — CBG MONITORING, ED: Glucose-Capillary: 327 mg/dL — ABNORMAL HIGH (ref 70–99)

## 2021-08-15 MED ORDER — FLUORESCEIN SODIUM 1 MG OP STRP
1.0000 | ORAL_STRIP | Freq: Once | OPHTHALMIC | Status: AC
  Administered 2021-08-15: 1 via OPHTHALMIC
  Filled 2021-08-15: qty 1

## 2021-08-15 MED ORDER — CIPROFLOXACIN HCL 0.3 % OP SOLN
1.0000 [drp] | OPHTHALMIC | Status: DC
  Administered 2021-08-15: 1 [drp] via OPHTHALMIC
  Filled 2021-08-15: qty 2.5

## 2021-08-15 MED ORDER — CYCLOPENTOLATE HCL 1 % OP SOLN
1.0000 [drp] | Freq: Once | OPHTHALMIC | Status: AC
  Administered 2021-08-15: 1 [drp] via OPHTHALMIC
  Filled 2021-08-15: qty 2

## 2021-08-15 MED ORDER — NAPROXEN 375 MG PO TABS: 375.0000 mg | ORAL_TABLET | Freq: Two times a day (BID) | ORAL | 0 refills | Status: DC

## 2021-08-15 MED ORDER — ACETAMINOPHEN 325 MG PO TABS
650.0000 mg | ORAL_TABLET | Freq: Once | ORAL | Status: AC
  Administered 2021-08-15: 650 mg via ORAL
  Filled 2021-08-15: qty 2

## 2021-08-15 MED ORDER — TETRACAINE HCL 0.5 % OP SOLN
2.0000 [drp] | Freq: Once | OPHTHALMIC | Status: AC
  Administered 2021-08-15: 2 [drp] via OPHTHALMIC
  Filled 2021-08-15: qty 4

## 2021-08-15 NOTE — ED Provider Notes (Signed)
[facility] DEPT Provider Note   CSN: [id number] Arrival date & time: 08/15/21  1848     History Chief Complaint  Patient presents with   Eye Problem    DELONTA YOHANNES is a 25 y.o. male.   Eye Problem  Patient presents to the ED with complaints of eye discomfort.Patient states his symptoms started this morning.  He started having irritation to his left eye.  He then started to notice some redness and drainage.  He is having a headache as well.  He denies any blurred vision.  Light does exacerbate the symptoms.  Feels better when his eyes closed.  He does have a history of diabetes but no known history of ocular problems.  Patient does not recall any injuries.  Past Medical History:  Diagnosis Date   Diabetes mellitus without complication ([place])    INSULIN DEPENDENT   SINCE AGE 79   DKA (diabetic ketoacidoses) 02/17/2017   Hashimoto's disease    MRSA infection    Pneumonia    Liver contusion, lung effusion, sepsis multiorgan failure, no clear source    Patient Active Problem List   Diagnosis Date Noted   Leukocytosis 10/28/2020   Rash 10/28/2020   DKA, type 1 ([place]) 02/17/2017   Nausea & vomiting 02/17/2017   DKA (diabetic ketoacidoses) 12/16/2016   Cellulitis 12/16/2016   AKI (acute kidney injury) ([place]) 12/16/2016   DKA, type 1, not at goal [facility]) 12/16/2016   Hypothyroidism (acquired) 11/01/2014   Type 1 diabetes mellitus with hyperglycemia ([place]) 08/15/2001    Past Surgical History:  Procedure Laterality Date   CHEST TUBE INSERTION         Family History  Problem Relation Age of Onset   Diabetes Cousin    Heart disease Other    Cancer Other    Thyroid disease Other    Healthy Mother     Social History   Tobacco Use   Smoking status: Former    Packs/day: 0.50    Years: 4.00    Pack years: 2.00    Types: Cigarettes    Quit date: 05/23/2017    Years since quitting: 4.2   Smokeless tobacco: Never  Vaping Use   Vaping  Use: Every day   Substances: Nicotine, Flavoring  Substance Use Topics   Alcohol use: Yes    Comment: occ   Drug use: No    Home Medications Prior to Admission medications   Medication Sig Start Date End Date Taking? Authorizing Provider  blood glucose meter kit and supplies KIT Dispense based on patient and insurance preference. Use up to four times daily as directed. 11/03/20   Raiford Noble Latif, DO  chlorhexidine (HIBICLENS) 4 % external liquid Apply topically daily as needed. Dilute 10-15 mL in water, Use daily when bathing for 1-2 weeks 11/03/20   Raiford Noble Latif, DO  feeding supplement, GLUCERNA SHAKE, (GLUCERNA SHAKE) LIQD Take 237 mLs by mouth 2 (two) times daily between meals. 11/03/20   Raiford Noble Latif, DO  FLUoxetine (PROZAC) 20 MG capsule Take 1 capsule (20 mg total) by mouth daily. 11/03/20 12/03/20  Raiford Noble Latif, DO  gabapentin (NEURONTIN) 100 MG capsule Take 1 capsule (100 mg total) by mouth 2 (two) times daily. 11/03/20   Raiford Noble Latif, DO  hydrOXYzine (ATARAX/VISTARIL) 25 MG tablet Take 1 tablet (25 mg total) by mouth 3 (three) times daily as needed for itching. 11/03/20   Raiford Noble Latif, DO  insulin aspart (NOVOLOG) 100 UNIT/ML FlexPen  Inject 12 Units into the skin 3 (three) times daily with meals. 11/03/20   Raiford Noble Latif, DO  insulin aspart (NOVOLOG) 100 UNIT/ML FlexPen Inject 0-9 Units into the skin 3 (three) times daily with meals. CBG 70 - 120: 0 units CBG 121 - 150: 0 units CBG 151 - 200: 0 units CBG 201 - 250: 2 units CBG 251 - 300: 3 units CBG 301 - 350: 4 units CBG 351 - 400: 5 units CBG > 400: call MD and obtain STAT lab verification 11/03/20   Raiford Noble Latif, DO  insulin glargine (LANTUS) 100 UNIT/ML Solostar Pen Inject 38 Units into the skin daily. 11/03/20   Raiford Noble Latif, DO  Insulin Pen Needle (PEN NEEDLES 3/16") 31G X 5 MM MISC 1 Container by Does not apply route 4 (four) times daily -  before meals and at bedtime.  11/03/20   Raiford Noble Latif, DO  levothyroxine (SYNTHROID) 150 MCG tablet Take 1 tablet (150 mcg total) by mouth daily. 11/03/20 12/03/20  Raiford Noble Latif, DO  Multiple Vitamin (MULTIVITAMIN WITH MINERALS) TABS tablet Take 1 tablet by mouth daily. 11/04/20   Raiford Noble Latif, DO  mupirocin cream (BACTROBAN) 2 % Apply topically 2 (two) times daily. 11/03/20   Raiford Noble Latif, DO  Nutritional Supplements (,FEEDING SUPPLEMENT, PROSOURCE PLUS) liquid Take 30 mLs by mouth 2 (two) times daily between meals. 11/03/20   Raiford Noble Latif, DO  oxyCODONE (OXY IR/ROXICODONE) 5 MG immediate release tablet Take 1 tablet (5 mg total) by mouth every 6 (six) hours as needed for moderate pain or breakthrough pain. 11/03/20   Raiford Noble Latif, DO  predniSONE (STERAPRED UNI-PAK 21 TAB) 10 MG (21) TBPK tablet Take 6 pills on Day 1, 5 pills on day 2, 4 pills on day 3, 3 pills on day 4, 2 pills on day 5, 1 pill on day 6 and then stop on day 7 11/03/20   Alfredia Ferguson, Omair Latif, DO  senna (SENOKOT) 8.6 MG TABS tablet Take 1 tablet (8.6 mg total) by mouth at bedtime. 11/03/20   Raiford Noble Latif, DO  triamcinolone (KENALOG) 0.1 % Apply topically 2 (two) times daily. 11/03/20   Raiford Noble Latif, DO  pantoprazole (PROTONIX) 40 MG tablet Take 1 tablet (40 mg total) by mouth daily. 02/18/17 06/06/20  Ghimire, Henreitta Leber, MD    Allergies    Ibuprofen  Review of Systems   Review of Systems  All other systems reviewed and are negative.  Physical Exam Updated Vital Signs BP (!) 124/94 (BP Location: Left Arm)   Pulse 93   Temp 98.4 F (36.9 C) (Oral)   Resp 16   Ht 1.676 m (5' 6" )   Wt 78 kg   SpO2 97%   BMI 27.76 kg/m   Physical Exam Vitals and nursing note reviewed.  Constitutional:      General: He is not in acute distress.    Appearance: He is well-developed.  HENT:     Head: Normocephalic and atraumatic.     Right Ear: External ear normal.     Left Ear: External ear normal.  Eyes:     General:  Lids are normal. Lids are everted, no foreign bodies appreciated. No scleral icterus.       Right eye: No discharge.        Left eye: No discharge.     Extraocular Movements: Extraocular movements intact.     Conjunctiva/sclera:     Left eye: Left conjunctiva is injected. No  exudate or hemorrhage.    Pupils: Pupils are equal, round, and reactive to light.     Right eye: No corneal abrasion or fluorescein uptake.     Left eye: No corneal abrasion or fluorescein uptake.     Slit lamp exam:    Right eye: No corneal ulcer, foreign body, hyphema or hypopyon.     Left eye: Photophobia present. No corneal ulcer, foreign body, hyphema or hypopyon.     Comments: Pupils equal and reactive, conjunctival injection of the left eye, no mucopurulent drainage noted Ocular pressure 20 on the left side  Neck:     Trachea: No tracheal deviation.  Cardiovascular:     Rate and Rhythm: Normal rate and regular rhythm.  Pulmonary:     Effort: Pulmonary effort is normal. No respiratory distress.     Breath sounds: Normal breath sounds. No stridor. No wheezing or rales.  Abdominal:     General: Bowel sounds are normal. There is no distension.     Palpations: Abdomen is soft.     Tenderness: There is no abdominal tenderness. There is no guarding or rebound.  Musculoskeletal:        General: No tenderness or deformity.     Cervical back: Neck supple.  Skin:    General: Skin is warm and dry.     Findings: No rash.  Neurological:     General: No focal deficit present.     Mental Status: He is alert.     Cranial Nerves: No cranial nerve deficit (no facial droop, extraocular movements intact, no slurred speech).     Sensory: No sensory deficit.     Motor: No abnormal muscle tone or seizure activity.     Coordination: Coordination normal.  Psychiatric:        Mood and Affect: Mood normal.    ED Results / Procedures / Treatments   Labs (all labs ordered are listed, but only abnormal results are  displayed) Labs Reviewed  CBG MONITORING, ED - Abnormal; Notable for the following components:      Result Value   Glucose-Capillary 327 (*)    All other components within normal limits  I-STAT CHEM 8, ED    EKG None  Radiology No results found.  Procedures Procedures   Medications Ordered in ED Medications  acetaminophen (TYLENOL) tablet 650 mg (has no administration in time range)  cyclopentolate (CYCLODRYL,CYCLOGYL) 1 % ophthalmic solution 1 drop (has no administration in time range)  ciprofloxacin (CILOXAN) 0.3 % ophthalmic solution 1 drop (has no administration in time range)  tetracaine (PONTOCAINE) 0.5 % ophthalmic solution 2 drop (2 drops Left Eye Given by Other 08/15/21 2031)  fluorescein ophthalmic strip 1 strip (1 strip Left Eye Given by Other 08/15/21 2031)    ED Course  I have reviewed the triage vital signs and the nursing notes.  Pertinent labs & imaging results that were available during my care of the patient were reviewed by me and considered in my medical decision making (see chart for details).  Clinical Course as of 08/15/21 2133  Fri Aug 15, 2021  2126 Electrolyte panel shows hyperglycemia without any signs of acidosis. [JK]    Clinical Course User Index [JK] Dorie Rank, MD   MDM Rules/Calculators/A&P                           Patient presents with spine pain and photophobia.  No signs of coronary ulceration on exam.  No  elevated intraocular pressure.  No definite cell and flare noted on exam.  Symptoms are concerning for the possibility of uveitis with his photophobia injection.  Cycloplegic placed for comfort.  Will discharge home with course of antibiotics recommend close follow-up with ophthalmology.  Pt states he has plans to see an eye doctor in [place].  Will provider Dr Zenia Resides office number as well here in [place]  Blood sugar was elevated in the ED.    Pt however did take his SSI while in the ED.  Final Clinical Impression(s) / ED  Diagnoses Final diagnoses:  Left eye pain    Rx / DC Orders ED Discharge Orders     None        Dorie Rank, MD 08/15/21 2138

## 2021-08-15 NOTE — ED Triage Notes (Signed)
PT c/o L eye pain with drainage and headache since this morning.

## 2021-08-15 NOTE — Discharge Instructions (Addendum)
Take the antibiotic eyedrops as prescribed.  Take over-the-counter medications as needed for pain.  Follow-up with an eye doctor as soon as possible to be evaluated further

## 2021-08-15 NOTE — ED Provider Notes (Signed)
Emergency Medicine Provider Triage Evaluation Note  David Ramsey , a 25 y.o. male  was evaluated in triage.  Pt complains of left eye pain which he describes as a sharp sensation.  Initially had a headache this morning.  Reports associated photophobia in the left eye.  Denies any visual deficits including blurred vision, double vision, vision loss.  Is diabetic.  Review of Systems  Positive:  Negative: See above   Physical Exam  BP (!) 124/94 (BP Location: Left Arm)   Pulse 93   Temp 98.4 F (36.9 C) (Oral)   Resp 16   Ht 5\' 6"  (1.676 m)   Wt 78 kg   SpO2 97%   BMI 27.76 kg/m  Gen:   Awake, no distress   Resp:  Normal effort  MSK:   Moves extremities without difficulty  Other:  Left eye has conjunctival injection.  No obvious steamy cornea on my exam.  Pupils are round and reactive to light and equal.  Medical Decision Making  Medically screening exam initiated at 7:31 PM.  Appropriate orders placed.  KEVAUGHN EWING was informed that the remainder of the evaluation will be completed by another provider, this initial triage assessment does not replace that evaluation, and the importance of remaining in the ED until their evaluation is complete.  Needs thorough eye exam.   [name] 08/15/21 1934    14/09/22, MD 08/15/21 2110

## 2021-08-19 DIAGNOSIS — H5213 Myopia, bilateral: Secondary | ICD-10-CM | POA: Diagnosis not present

## 2021-08-19 DIAGNOSIS — Z8639 Personal history of other endocrine, nutritional and metabolic disease: Secondary | ICD-10-CM | POA: Diagnosis not present

## 2021-08-19 DIAGNOSIS — H15112 Episcleritis periodica fugax, left eye: Secondary | ICD-10-CM | POA: Diagnosis not present

## 2021-08-19 DIAGNOSIS — E1040 Type 1 diabetes mellitus with diabetic neuropathy, unspecified: Secondary | ICD-10-CM | POA: Diagnosis not present

## 2021-08-19 LAB — HM DIABETES EYE EXAM

## 2021-08-27 DIAGNOSIS — J101 Influenza due to other identified influenza virus with other respiratory manifestations: Secondary | ICD-10-CM | POA: Diagnosis not present

## 2021-10-25 ENCOUNTER — Inpatient Hospital Stay (HOSPITAL_COMMUNITY)
Admission: EM | Admit: 2021-10-25 | Discharge: 2021-10-26 | DRG: 638 | Disposition: A | Discharge status: Home / Self Care | Payer: BC Managed Care – PPO | Attending: Family Medicine | Admitting: Family Medicine

## 2021-10-25 DIAGNOSIS — E860 Dehydration: Secondary | ICD-10-CM | POA: Diagnosis not present

## 2021-10-25 DIAGNOSIS — Z91048 Other nonmedicinal substance allergy status: Secondary | ICD-10-CM

## 2021-10-25 DIAGNOSIS — G894 Chronic pain syndrome: Secondary | ICD-10-CM | POA: Diagnosis present

## 2021-10-25 DIAGNOSIS — Z20822 Contact with and (suspected) exposure to covid-19: Secondary | ICD-10-CM | POA: Diagnosis present

## 2021-10-25 DIAGNOSIS — Z9114 Patient's other noncompliance with medication regimen: Secondary | ICD-10-CM | POA: Diagnosis not present

## 2021-10-25 DIAGNOSIS — Z79890 Hormone replacement therapy (postmenopausal): Secondary | ICD-10-CM | POA: Diagnosis not present

## 2021-10-25 DIAGNOSIS — E1010 Type 1 diabetes mellitus with ketoacidosis without coma: Principal | ICD-10-CM

## 2021-10-25 DIAGNOSIS — Z87891 Personal history of nicotine dependence: Secondary | ICD-10-CM

## 2021-10-25 DIAGNOSIS — Z79899 Other long term (current) drug therapy: Secondary | ICD-10-CM

## 2021-10-25 DIAGNOSIS — R000 Tachycardia, unspecified: Secondary | ICD-10-CM | POA: Diagnosis not present

## 2021-10-25 DIAGNOSIS — F419 Anxiety disorder, unspecified: Secondary | ICD-10-CM | POA: Diagnosis present

## 2021-10-25 DIAGNOSIS — E1110 Type 2 diabetes mellitus with ketoacidosis without coma: Secondary | ICD-10-CM | POA: Diagnosis present

## 2021-10-25 DIAGNOSIS — E1065 Type 1 diabetes mellitus with hyperglycemia: Secondary | ICD-10-CM | POA: Diagnosis not present

## 2021-10-25 DIAGNOSIS — E063 Autoimmune thyroiditis: Secondary | ICD-10-CM | POA: Diagnosis not present

## 2021-10-25 DIAGNOSIS — Z794 Long term (current) use of insulin: Secondary | ICD-10-CM | POA: Diagnosis not present

## 2021-10-25 DIAGNOSIS — N179 Acute kidney failure, unspecified: Secondary | ICD-10-CM | POA: Diagnosis present

## 2021-10-25 DIAGNOSIS — Z8614 Personal history of Methicillin resistant Staphylococcus aureus infection: Secondary | ICD-10-CM

## 2021-10-25 DIAGNOSIS — Z8701 Personal history of pneumonia (recurrent): Secondary | ICD-10-CM | POA: Diagnosis not present

## 2021-10-25 DIAGNOSIS — E039 Hypothyroidism, unspecified: Secondary | ICD-10-CM | POA: Diagnosis present

## 2021-10-25 DIAGNOSIS — R197 Diarrhea, unspecified: Secondary | ICD-10-CM | POA: Diagnosis not present

## 2021-10-25 DIAGNOSIS — T383X6A Underdosing of insulin and oral hypoglycemic [antidiabetic] drugs, initial encounter: Secondary | ICD-10-CM | POA: Diagnosis present

## 2021-10-25 LAB — RESP PANEL BY RT-PCR (FLU A&B, COVID) ARPGX2
Influenza A by PCR: NEGATIVE
Influenza B by PCR: NEGATIVE
SARS Coronavirus 2 by RT PCR: NEGATIVE

## 2021-10-25 LAB — BLOOD GAS, VENOUS
Acid-base deficit: 16.3 mmol/L — ABNORMAL HIGH (ref 0.0–2.0)
Bicarbonate: 10.9 mmol/L — ABNORMAL LOW (ref 20.0–28.0)
O2 Saturation: 76.3 %
Patient temperature: 37
pCO2, Ven: 30 mmHg — ABNORMAL LOW (ref 44–60)
pH, Ven: 7.17 — CL (ref 7.25–7.43)
pO2, Ven: 48 mmHg — ABNORMAL HIGH (ref 32–45)

## 2021-10-25 LAB — URINALYSIS, ROUTINE W REFLEX MICROSCOPIC
Bilirubin Urine: NEGATIVE
Glucose, UA: >=500 mg/dL — AB
Hgb urine dipstick: NEGATIVE
Ketones, ur: 80 mg/dL — AB
Leukocytes,Ua: NEGATIVE
Nitrite: NEGATIVE
Protein, ur: NEGATIVE mg/dL
Specific Gravity, Urine: 1.016 (ref 1.005–1.030)
pH: 5 (ref 5.0–8.0)

## 2021-10-25 LAB — CBC
HCT: 46.9 % (ref 39.0–52.0)
HCT: 36.6 % — ABNORMAL LOW (ref 39.0–52.0)
Hemoglobin: 15.8 g/dL (ref 13.0–17.0)
Hemoglobin: 12.7 g/dL — ABNORMAL LOW (ref 13.0–17.0)
MCH: 30.5 pg (ref 26.0–34.0)
MCH: 30.3 pg (ref 26.0–34.0)
MCHC: 33.7 g/dL (ref 30.0–36.0)
MCHC: 34.7 g/dL (ref 30.0–36.0)
MCV: 90.5 fL (ref 80.0–100.0)
MCV: 87.4 fL (ref 80.0–100.0)
Platelets: 412 10*3/uL — ABNORMAL HIGH (ref 150–400)
Platelets: 399 10*3/uL (ref 150–400)
RBC: 5.18 MIL/uL (ref 4.22–5.81)
RBC: 4.19 MIL/uL — ABNORMAL LOW (ref 4.22–5.81)
RDW: 12.5 % (ref 11.5–15.5)
RDW: 12.5 % (ref 11.5–15.5)
WBC: 8 10*3/uL (ref 4.0–10.5)
WBC: 10.2 10*3/uL (ref 4.0–10.5)
nRBC: 0 % (ref 0.0–0.2)
nRBC: 0 % (ref 0.0–0.2)

## 2021-10-25 LAB — BASIC METABOLIC PANEL
Anion gap: 24 — ABNORMAL HIGH (ref 5–15)
Anion gap: >20 — ABNORMAL HIGH (ref 5–15)
BUN: 18 mg/dL (ref 6–20)
BUN: 18 mg/dL (ref 6–20)
CO2: 13 mmol/L — ABNORMAL LOW (ref 22–32)
CO2: 11 mmol/L — ABNORMAL LOW (ref 22–32)
Calcium: 9.4 mg/dL (ref 8.9–10.3)
Calcium: 8.7 mg/dL — ABNORMAL LOW (ref 8.9–10.3)
Chloride: 95 mmol/L — ABNORMAL LOW (ref 98–111)
Chloride: 99 mmol/L (ref 98–111)
Creatinine, Ser: 1.53 mg/dL — ABNORMAL HIGH (ref 0.61–1.24)
Creatinine, Ser: 1.39 mg/dL — ABNORMAL HIGH (ref 0.61–1.24)
GFR, Estimated: >60 mL/min (ref >60)
GFR, Estimated: >60 mL/min (ref >60)
Glucose, Bld: 564 mg/dL (ref 70–99)
Glucose, Bld: 407 mg/dL — ABNORMAL HIGH (ref 70–99)
Potassium: 5.1 mmol/L (ref 3.5–5.1)
Potassium: 4.2 mmol/L (ref 3.5–5.1)
Sodium: 132 mmol/L — ABNORMAL LOW (ref 135–145)
Sodium: 131 mmol/L — ABNORMAL LOW (ref 135–145)

## 2021-10-25 LAB — I-STAT CHEM 8, ED
BUN: 16 mg/dL (ref 6–20)
Calcium, Ion: 1.12 mmol/L — ABNORMAL LOW (ref 1.15–1.40)
Chloride: 100 mmol/L (ref 98–111)
Creatinine, Ser: 1.1 mg/dL (ref 0.61–1.24)
Glucose, Bld: 550 mg/dL (ref 70–99)
HCT: 46 % (ref 39.0–52.0)
Hemoglobin: 15.6 g/dL (ref 13.0–17.0)
Potassium: 4.6 mmol/L (ref 3.5–5.1)
Sodium: 132 mmol/L — ABNORMAL LOW (ref 135–145)
TCO2: 13 mmol/L — ABNORMAL LOW (ref 22–32)

## 2021-10-25 LAB — CBG MONITORING, ED
Glucose-Capillary: 502 mg/dL (ref 70–99)
Glucose-Capillary: 569 mg/dL (ref 70–99)
Glucose-Capillary: 369 mg/dL — ABNORMAL HIGH (ref 70–99)
Glucose-Capillary: 324 mg/dL — ABNORMAL HIGH (ref 70–99)
Glucose-Capillary: 268 mg/dL — ABNORMAL HIGH (ref 70–99)
Glucose-Capillary: 226 mg/dL — ABNORMAL HIGH (ref 70–99)
Glucose-Capillary: 187 mg/dL — ABNORMAL HIGH (ref 70–99)
Glucose-Capillary: 166 mg/dL — ABNORMAL HIGH (ref 70–99)

## 2021-10-25 LAB — BETA-HYDROXYBUTYRIC ACID: Beta-Hydroxybutyric Acid: >8 mmol/L — ABNORMAL HIGH (ref 0.05–0.27)

## 2021-10-25 MED ORDER — INSULIN REGULAR(HUMAN) IN NACL 100-0.9 UT/100ML-% IV SOLN
INTRAVENOUS | Status: DC
  Administered 2021-10-25: 8.5 [IU]/h via INTRAVENOUS
  Administered 2021-10-26: 7 [IU]/h via INTRAVENOUS
  Filled 2021-10-25 (×2): qty 100

## 2021-10-25 MED ORDER — LACTATED RINGERS IV SOLN: INTRAVENOUS | Status: DC

## 2021-10-25 MED ORDER — LACTATED RINGERS IV BOLUS
1000.0000 mL | Freq: Once | INTRAVENOUS | Status: AC
  Administered 2021-10-25: 1000 mL via INTRAVENOUS

## 2021-10-25 MED ORDER — ONDANSETRON HCL 4 MG/2ML IJ SOLN
4.0000 mg | Freq: Four times a day (QID) | INTRAMUSCULAR | Status: DC | PRN
  Administered 2021-10-25: 4 mg via INTRAVENOUS
  Filled 2021-10-25: qty 2

## 2021-10-25 MED ORDER — LACTATED RINGERS IV BOLUS
20.0000 mL/kg | Freq: Once | INTRAVENOUS | Status: DC
Start: 2021-10-25 — End: 2021-10-25

## 2021-10-25 MED ORDER — POTASSIUM CHLORIDE 10 MEQ/100ML IV SOLN
10.0000 meq | INTRAVENOUS | Status: AC
  Administered 2021-10-25 (×2): 10 meq via INTRAVENOUS
  Filled 2021-10-25 (×2): qty 100

## 2021-10-25 MED ORDER — ENOXAPARIN SODIUM 40 MG/0.4ML IJ SOSY
40.0000 mg | PREFILLED_SYRINGE | INTRAMUSCULAR | Status: DC
  Administered 2021-10-25: 40 mg via SUBCUTANEOUS
  Filled 2021-10-25: qty 0.4

## 2021-10-25 MED ORDER — LACTATED RINGERS IV BOLUS
20.0000 mL/kg | Freq: Once | INTRAVENOUS | Status: AC
  Administered 2021-10-25: 1452 mL via INTRAVENOUS

## 2021-10-25 MED ORDER — LEVOTHYROXINE SODIUM 75 MCG PO TABS
150.0000 ug | ORAL_TABLET | Freq: Every day | ORAL | Status: DC
  Administered 2021-10-26: 150 ug via ORAL
  Filled 2021-10-25: qty 1

## 2021-10-25 MED ORDER — MORPHINE SULFATE (PF) 2 MG/ML IV SOLN
2.0000 mg | Freq: Once | INTRAVENOUS | Status: AC
  Administered 2021-10-25: 2 mg via INTRAVENOUS
  Filled 2021-10-25: qty 1

## 2021-10-25 MED ORDER — DEXTROSE 50 % IV SOLN: 0.0000 mL | INTRAVENOUS | Status: DC | PRN

## 2021-10-25 MED ORDER — DEXTROSE IN LACTATED RINGERS 5 % IV SOLN: INTRAVENOUS | Status: DC

## 2021-10-25 NOTE — ED Provider Notes (Signed)
[facility] DEPT Provider Note   CSN: [id number] Arrival date & time: 10/25/21  1236     History  Chief Complaint  Patient presents with   Blood Sugar Problem    David Ramsey is a 26 y.o. male.  HPI  26 year old male with a history of type 1 diabetes who presents to the emergency department with concern for hyperglycemia.  The patient states that he had gastroenteritis 1 week ago and feels like he is going into DKA.  He states that he has had a fruity odor to his breath.  He endorses polyuria and polydipsia.  His CBG in triage was 569.  He states that he has had trouble with his insurance and affording his insulin and so he has been treating himself with 70/30 from [place].  He feels dehydrated.  Home Medications Prior to Admission medications   Medication Sig Start Date End Date Taking? Authorizing Provider  FLUoxetine (PROZAC) 20 MG capsule Take 1 capsule (20 mg total) by mouth daily. 11/03/20 10/25/21 Yes Sheikh, Omair Latif, DO  insulin glargine (LANTUS) 100 UNIT/ML Solostar Pen Inject 38 Units into the skin daily. 11/03/20  Yes Sheikh, [place], DO  blood glucose meter kit and supplies KIT Dispense based on patient and insurance preference. Use up to four times daily as directed. 11/03/20   Raiford Noble Latif, DO  Insulin Pen Needle (PEN NEEDLES 3/16") 31G X 5 MM MISC 1 Container by Does not apply route 4 (four) times daily -  before meals and at bedtime. 11/03/20   Raiford Noble Latif, DO  levothyroxine (SYNTHROID) 150 MCG tablet Take 1 tablet (150 mcg total) by mouth daily. 11/03/20 12/03/20  Raiford Noble Latif, DO  Multiple Vitamin (MULTIVITAMIN WITH MINERALS) TABS tablet Take 1 tablet by mouth daily. 11/04/20   Raiford Noble Latif, DO  mupirocin cream (BACTROBAN) 2 % Apply topically 2 (two) times daily. 11/03/20   Raiford Noble Latif, DO  naproxen (NAPROSYN) 375 MG tablet Take 1 tablet (375 mg total) by mouth 2 (two) times daily. 08/15/21   Dorie Rank, MD  Nutritional Supplements (,FEEDING SUPPLEMENT, PROSOURCE PLUS) liquid Take 30 mLs by mouth 2 (two) times daily between meals. 11/03/20   Raiford Noble Latif, DO  oxyCODONE (OXY IR/ROXICODONE) 5 MG immediate release tablet Take 1 tablet (5 mg total) by mouth every 6 (six) hours as needed for moderate pain or breakthrough pain. 11/03/20   Raiford Noble Latif, DO  predniSONE (STERAPRED UNI-PAK 21 TAB) 10 MG (21) TBPK tablet Take 6 pills on Day 1, 5 pills on day 2, 4 pills on day 3, 3 pills on day 4, 2 pills on day 5, 1 pill on day 6 and then stop on day 7 11/03/20   Alfredia Ferguson, Omair Latif, DO  senna (SENOKOT) 8.6 MG TABS tablet Take 1 tablet (8.6 mg total) by mouth at bedtime. 11/03/20   Raiford Noble Latif, DO  triamcinolone (KENALOG) 0.1 % Apply topically 2 (two) times daily. 11/03/20   Raiford Noble Latif, DO  pantoprazole (PROTONIX) 40 MG tablet Take 1 tablet (40 mg total) by mouth daily. 02/18/17 06/06/20  Ghimire, Henreitta Leber, MD      Allergies    Tape    Review of Systems   Review of Systems  All other systems reviewed and are negative.  Physical Exam Updated Vital Signs BP 132/78    Pulse (!) 108    Temp 97.7 F (36.5 C) (Oral)    Resp 18  Ht 5' 6"  (1.676 m)    Wt 72.6 kg    SpO2 98%    BMI 25.82 kg/m  Physical Exam Vitals and nursing note reviewed.  Constitutional:      General: He is not in acute distress.    Appearance: He is ill-appearing.  HENT:     Head: Normocephalic and atraumatic.     Mouth/Throat:     Mouth: Mucous membranes are dry.  Eyes:     Conjunctiva/sclera: Conjunctivae normal.     Pupils: Pupils are equal, round, and reactive to light.  Cardiovascular:     Rate and Rhythm: Regular rhythm. Tachycardia present.     Pulses: Normal pulses.  Pulmonary:     Effort: Pulmonary effort is normal. No respiratory distress.  Abdominal:     General: There is no distension.     Tenderness: There is no guarding.  Musculoskeletal:        General: No deformity or signs of  injury.     Cervical back: Neck supple.  Skin:    Findings: No lesion or rash.  Neurological:     General: No focal deficit present.     Mental Status: He is alert. Mental status is at baseline.    ED Results / Procedures / Treatments   Labs (all labs ordered are listed, but only abnormal results are displayed) Labs Reviewed  BASIC METABOLIC PANEL - Abnormal; Notable for the following components:      Result Value   Sodium 132 (*)    Chloride 95 (*)    CO2 13 (*)    Glucose, Bld 564 (*)    Creatinine, Ser 1.53 (*)    Anion gap 24 (*)    All other components within normal limits  CBC - Abnormal; Notable for the following components:   Platelets 412 (*)    All other components within normal limits  BLOOD GAS, VENOUS - Abnormal; Notable for the following components:   pH, Ven 7.17 (*)    pCO2, Ven 30 (*)    pO2, Ven 48 (*)    Bicarbonate 10.9 (*)    Acid-base deficit 16.3 (*)    All other components within normal limits  CBG MONITORING, ED - Abnormal; Notable for the following components:   Glucose-Capillary 502 (*)    All other components within normal limits  CBG MONITORING, ED - Abnormal; Notable for the following components:   Glucose-Capillary 569 (*)    All other components within normal limits  I-STAT CHEM 8, ED - Abnormal; Notable for the following components:   Sodium 132 (*)    Glucose, Bld 550 (*)    Calcium, Ion 1.12 (*)    TCO2 13 (*)    All other components within normal limits  RESP PANEL BY RT-PCR (FLU A&B, COVID) ARPGX2  URINALYSIS, ROUTINE W REFLEX MICROSCOPIC  BETA-HYDROXYBUTYRIC ACID  BETA-HYDROXYBUTYRIC ACID  BASIC METABOLIC PANEL  BASIC METABOLIC PANEL  BASIC METABOLIC PANEL    EKG None  Radiology No results found.  Procedures .Critical Care E&M Performed by: Regan Lemming, MD  Critical care provider statement:    Critical care time (minutes):  36   Critical care was necessary to treat or prevent imminent or life-threatening  deterioration of the following conditions:  Endocrine crisis   Critical care was time spent personally by me on the following activities:  Ordering and performing treatments and interventions, ordering and review of laboratory studies, ordering and review of radiographic studies, pulse oximetry, re-evaluation of patient's  condition, obtaining history from patient or surrogate, development of treatment plan with patient or surrogate and examination of patient   Care discussed with: admitting provider   After initial E/M assessment, critical care services were subsequently performed that were exclusive of separately billable procedures or treatment.      Medications Ordered in ED Medications  insulin regular, human (MYXREDLIN) 100 units/ 100 mL infusion (has no administration in time range)  lactated ringers infusion (has no administration in time range)  dextrose 5 % in lactated ringers infusion (has no administration in time range)  dextrose 50 % solution 0-50 mL (has no administration in time range)  potassium chloride 10 mEq in 100 mL IVPB (has no administration in time range)  lactated ringers bolus 1,000 mL (has no administration in time range)  lactated ringers bolus 1,000 mL (1,000 mLs Intravenous New Bag/Given 10/25/21 1330)    ED Course/ Medical Decision Making/ A&P Clinical Course as of 10/25/21 1436  Sat Oct 25, 2021  1310 Glucose-Capillary(!!): 569 [JL]  1351 pH, Ven(!!): 7.17 [JL]    Clinical Course User Index [JL] Regan Lemming, MD                           Medical Decision Making Amount and/or Complexity of Data Reviewed Labs: ordered. Decision-making details documented in ED Course.  Risk Prescription drug management. Decision regarding hospitalization.    26 year old male with a history of type 1 diabetes who presents to the emergency department with concern for hyperglycemia.  The patient states that he had gastroenteritis 1 week ago and feels like he is going  into DKA.  He states that he has had a fruity odor to his breath.  He endorses polyuria and polydipsia.  His CBG in triage was 569.  He states that he has had trouble with his insurance and affording his insulin and so he has been treating himself with 70/30 from [place].  He feels dehydrated.  On arrival, the patient was afebrile, hemodynamically stable, tachycardic P1 18, mildly hypertensive BP 142/74, saturating 100% on room air.  Sinus tachycardia noted on cardiac telemetry.  Patient presenting with hyperglycemia in the setting of known diabetes with concern for DKA with an infectious trigger being gastroenteritis within the past week.  Blood glucose was notably 569 on patient arrival.  IV access was obtained and the patient was started on IV fluid bolus and laboratory work-up was initiated.  I independently interpreted the patient's labs.  The patient had a venous pH of 7.17, an i-STAT Chem-8 with a blood glucose of 550, normal serum creatinine of 1.1 and a potassium of 4.6.  A BMP revealed a creatinine of 1.53 concerning for AKI in the setting of dehydration and DKA.  The patient was started on IV potassium replacement and IV insulin for treatment for DKA.  Given the patient's acute critical illness, hospitalist medicine was consulted for admission to step-down status.   Final Clinical Impression(s) / ED Diagnoses Final diagnoses:  Diabetic ketoacidosis without coma associated with type 1 diabetes mellitus [facility])    Rx / DC Orders ED Discharge Orders     None         Regan Lemming, MD 10/25/21 1436

## 2021-10-25 NOTE — H&P (Signed)
History and Physical    Patient: David Ramsey OXB:[id number] DOB: 08-08-1996 DOA: 10/25/2021 DOS: the patient was seen and examined on 10/25/2021 PCP: Patient, No Pcp Per (Inactive)  Patient coming from: Home  Chief Complaint:  Chief Complaint  Patient presents with   Blood Sugar Problem    HPI: David Ramsey is a 26 y.o. male with medical history significant of DM1, hypothyroidism, anxiety. Presenting with diarrhea. Reports that he's had N/V/D for the last week or so. Everyone in his house has had a bout of the same, but they have improved. He hasn't been able to keep much down for the past couple of days. He noticed that his glucose was up. He tried to drink as much water as possible, but he was unable to maintain. He has had difficulty acquiring his insulin, so he had been using 70/30 when he can. When his symptoms didn't improve this morning, he decided to come to the ED for help.    Review of Systems: As mentioned in the history of present illness. All other systems reviewed and are negative. Past Medical History:  Diagnosis Date   Diabetes mellitus without complication ([place])    INSULIN DEPENDENT   SINCE AGE 89   DKA (diabetic ketoacidoses) 02/17/2017   Hashimoto's disease    MRSA infection    Pneumonia    Liver contusion, lung effusion, sepsis multiorgan failure, no clear source   Past Surgical History:  Procedure Laterality Date   CHEST TUBE INSERTION     Social History:  reports that he quit smoking about 4 years ago. His smoking use included cigarettes. He has a 2.00 pack-year smoking history. He has never used smokeless tobacco. He reports current alcohol use. He reports that he does not use drugs.  Allergies  Allergen Reactions   Tape Rash    Blisters- can use kerlix tape    Family History  Problem Relation Age of Onset   Diabetes Cousin    Heart disease Other    Cancer Other    Thyroid disease Other    Healthy Mother     Prior to Admission medications    Medication Sig Start Date End Date Taking? Authorizing Provider  acetaminophen (TYLENOL) 500 MG tablet Take 1,000 mg by mouth every 6 (six) hours as needed for moderate pain or headache.   Yes [provider]  FLUoxetine (PROZAC) 20 MG capsule Take 1 capsule (20 mg total) by mouth daily. 11/03/20 10/25/21 Yes Sheikh, Omair Latif, DO  gabapentin (NEURONTIN) 300 MG capsule Take 300 mg by mouth 3 (three) times daily. 07/09/21  Yes [provider]  insulin glargine (LANTUS) 100 UNIT/ML Solostar Pen Inject 38 Units into the skin daily. Patient taking differently: Inject 35 Units into the skin daily. 11/03/20  Yes Sheikh, Omair Latif, DO  naproxen sodium (ALEVE) 220 MG tablet Take 220 mg by mouth as needed (pain, headache).   Yes [provider]  triamcinolone (KENALOG) 0.1 % Apply topically 2 (two) times daily. 11/03/20  Yes Sheikh, Drayton, DO  blood glucose meter kit and supplies KIT Dispense based on patient and insurance preference. Use up to four times daily as directed. 11/03/20   Sheikh, Omair Latif, DO  HUMALOG KWIKPEN 100 UNIT/ML KwikPen Inject into the skin. Patient not taking: Reported on 10/25/2021 07/08/21   [provider]  Insulin Pen Needle (PEN NEEDLES 3/16") 31G X 5 MM MISC 1 Container by Does not apply route 4 (four) times daily -  before meals  and at bedtime. 11/03/20   Raiford Noble Latif, DO  levothyroxine (SYNTHROID) 150 MCG tablet Take 1 tablet (150 mcg total) by mouth daily. Patient not taking: Reported on 10/25/2021 11/03/20 12/03/20  Raiford Noble Latif, DO  naproxen (NAPROSYN) 375 MG tablet Take 1 tablet (375 mg total) by mouth 2 (two) times daily. Patient not taking: Reported on 10/25/2021 08/15/21   Dorie Rank, MD  pantoprazole (PROTONIX) 40 MG tablet Take 1 tablet (40 mg total) by mouth daily. 02/18/17 06/06/20  Jonetta Osgood, MD    Physical Exam: Vitals:   10/25/21 1500 10/25/21 1530 10/25/21 1600 10/25/21 1630  BP: 119/61 119/61 (!)  169/64 (!) 175/73  Pulse: (!) 108 (!) 108 (!) 110 (!) 101  Resp: 16 16 16 16   Temp:      TempSrc:      SpO2: 100% 100% 99% 99%  Weight:      Height:       General: 26 y.o. male resting in bed in NAD Eyes: PERRL, normal sclera ENMT: Nares patent w/o discharge, orophaynx clear, dentition normal, ears w/o discharge/lesions/ulcers Neck: Supple, trachea midline Cardiovascular: RRR, +S1, S2, no m/g/r, equal pulses throughout Respiratory: CTABL, no w/r/r, normal WOB GI: BS+, NDNT, no masses noted, no organomegaly noted MSK: No e/c/c Neuro: A&O x 3, no focal deficits Psyc: Appropriate interaction and affect, calm/cooperative   Data Reviewed:  CO2 13 Glucose 564 Scr 1.53 Anion gap 24 pH 7.17  Assessment and Plan: No notes have been filed under this hospital service. Service: Hospitalist DKA DM1     - admit to inpt, SDU     - continue Endotool, q4h BMP     - DM coordinator     - ran out of insulin and doesn't have PCP; TOC consult for med acquisition and PCP     - can have non-caloric liquids, otherwise, NPO for now  Diarrhea     - says every member of household has had diarrhea over the last week     - check GI PCR     - fluids   AKI     - from above     - fluids, follow  Hypothyroidism     - continue home regimen  Advance Care Planning:   Code Status: FULL  Consults: None  Family Communication: None at bedside  Severity of Illness: The appropriate patient status for this patient is INPATIENT. Inpatient status is judged to be reasonable and necessary in order to provide the required intensity of service to ensure the patient's safety. The patient's presenting symptoms, physical exam findings, and initial radiographic and laboratory data in the context of their chronic comorbidities is felt to place them at high risk for further clinical deterioration. Furthermore, it is not anticipated that the patient will be medically stable for discharge from the hospital within 2  midnights of admission.   * I certify that at the point of admission it is my clinical judgment that the patient will require inpatient hospital care spanning beyond 2 midnights from the point of admission due to high intensity of service, high risk for further deterioration and high frequency of surveillance required.*  Author: Jonnie Finner, DO 10/25/2021 5:23 PM  For on call review [email or web address].

## 2021-10-25 NOTE — ED Triage Notes (Addendum)
Patient reports he had a virus this past week and feels like his is going into DKA, says he can taste it as well. Pain reported is 8/10. CBG in triage 502. Patient reports he has not had proper insulin in a few months. Says he had issue with insurance paying for the insulin he needs so he has been getting 70/30 from [facility].

## 2021-10-26 ENCOUNTER — Other Ambulatory Visit: Payer: Self-pay

## 2021-10-26 ENCOUNTER — Encounter (HOSPITAL_COMMUNITY): Payer: Self-pay | Admitting: Family Medicine

## 2021-10-26 DIAGNOSIS — R197 Diarrhea, unspecified: Secondary | ICD-10-CM

## 2021-10-26 DIAGNOSIS — E1065 Type 1 diabetes mellitus with hyperglycemia: Secondary | ICD-10-CM

## 2021-10-26 DIAGNOSIS — E039 Hypothyroidism, unspecified: Secondary | ICD-10-CM

## 2021-10-26 DIAGNOSIS — N179 Acute kidney failure, unspecified: Secondary | ICD-10-CM

## 2021-10-26 LAB — CBG MONITORING, ED
Glucose-Capillary: 111 mg/dL — ABNORMAL HIGH (ref 70–99)
Glucose-Capillary: 115 mg/dL — ABNORMAL HIGH (ref 70–99)
Glucose-Capillary: 140 mg/dL — ABNORMAL HIGH (ref 70–99)
Glucose-Capillary: 161 mg/dL — ABNORMAL HIGH (ref 70–99)
Glucose-Capillary: 161 mg/dL — ABNORMAL HIGH (ref 70–99)
Glucose-Capillary: 207 mg/dL — ABNORMAL HIGH (ref 70–99)
Glucose-Capillary: 237 mg/dL — ABNORMAL HIGH (ref 70–99)
Glucose-Capillary: 242 mg/dL — ABNORMAL HIGH (ref 70–99)
Glucose-Capillary: 245 mg/dL — ABNORMAL HIGH (ref 70–99)

## 2021-10-26 LAB — BASIC METABOLIC PANEL
Anion gap: 10 (ref 5–15)
Anion gap: 10 (ref 5–15)
Anion gap: 10 (ref 5–15)
Anion gap: 4 — ABNORMAL LOW (ref 5–15)
Anion gap: 6 (ref 5–15)
BUN: 13 mg/dL (ref 6–20)
BUN: 13 mg/dL (ref 6–20)
BUN: 14 mg/dL (ref 6–20)
BUN: 14 mg/dL (ref 6–20)
BUN: 14 mg/dL (ref 6–20)
CO2: 21 mmol/L — ABNORMAL LOW (ref 22–32)
CO2: 22 mmol/L (ref 22–32)
CO2: 23 mmol/L (ref 22–32)
CO2: 25 mmol/L (ref 22–32)
CO2: 26 mmol/L (ref 22–32)
Calcium: 8.6 mg/dL — ABNORMAL LOW (ref 8.9–10.3)
Calcium: 8.6 mg/dL — ABNORMAL LOW (ref 8.9–10.3)
Calcium: 8.6 mg/dL — ABNORMAL LOW (ref 8.9–10.3)
Calcium: 8.6 mg/dL — ABNORMAL LOW (ref 8.9–10.3)
Calcium: 8.7 mg/dL — ABNORMAL LOW (ref 8.9–10.3)
Chloride: 101 mmol/L (ref 98–111)
Chloride: 102 mmol/L (ref 98–111)
Chloride: 103 mmol/L (ref 98–111)
Chloride: 104 mmol/L (ref 98–111)
Chloride: 105 mmol/L (ref 98–111)
Creatinine, Ser: 0.96 mg/dL (ref 0.61–1.24)
Creatinine, Ser: 1.05 mg/dL (ref 0.61–1.24)
Creatinine, Ser: 1.06 mg/dL (ref 0.61–1.24)
Creatinine, Ser: 1.09 mg/dL (ref 0.61–1.24)
Creatinine, Ser: 1.19 mg/dL (ref 0.61–1.24)
GFR, Estimated: >60 mL/min (ref >60)
GFR, Estimated: >60 mL/min (ref >60)
GFR, Estimated: >60 mL/min (ref >60)
GFR, Estimated: >60 mL/min (ref >60)
GFR, Estimated: >60 mL/min (ref >60)
Glucose, Bld: 126 mg/dL — ABNORMAL HIGH (ref 70–99)
Glucose, Bld: 191 mg/dL — ABNORMAL HIGH (ref 70–99)
Glucose, Bld: 193 mg/dL — ABNORMAL HIGH (ref 70–99)
Glucose, Bld: 196 mg/dL — ABNORMAL HIGH (ref 70–99)
Glucose, Bld: 210 mg/dL — ABNORMAL HIGH (ref 70–99)
Potassium: 3.2 mmol/L — ABNORMAL LOW (ref 3.5–5.1)
Potassium: 3.6 mmol/L (ref 3.5–5.1)
Potassium: 3.8 mmol/L (ref 3.5–5.1)
Potassium: 3.8 mmol/L (ref 3.5–5.1)
Potassium: 4 mmol/L (ref 3.5–5.1)
Sodium: 134 mmol/L — ABNORMAL LOW (ref 135–145)
Sodium: 134 mmol/L — ABNORMAL LOW (ref 135–145)
Sodium: 134 mmol/L — ABNORMAL LOW (ref 135–145)
Sodium: 134 mmol/L — ABNORMAL LOW (ref 135–145)
Sodium: 136 mmol/L (ref 135–145)

## 2021-10-26 LAB — BETA-HYDROXYBUTYRIC ACID
Beta-Hydroxybutyric Acid: 2.92 mmol/L — ABNORMAL HIGH (ref 0.05–0.27)
Beta-Hydroxybutyric Acid: 2.96 mmol/L — ABNORMAL HIGH (ref 0.05–0.27)

## 2021-10-26 LAB — HEMOGLOBIN A1C
Hgb A1c MFr Bld: 12.1 % — ABNORMAL HIGH (ref 4.8–5.6)
Mean Plasma Glucose: 300.57 mg/dL

## 2021-10-26 MED ORDER — INSULIN ASPART 100 UNIT/ML IJ SOLN
0.0000 [IU] | Freq: Three times a day (TID) | INTRAMUSCULAR | Status: DC
  Administered 2021-10-26: 2 [IU] via SUBCUTANEOUS
  Filled 2021-10-26: qty 0.09

## 2021-10-26 MED ORDER — INSULIN GLARGINE-YFGN 100 UNIT/ML ~~LOC~~ SOLN
10.0000 [IU] | Freq: Every day | SUBCUTANEOUS | Status: DC
  Administered 2021-10-26: 10 [IU] via SUBCUTANEOUS
  Filled 2021-10-26: qty 0.1

## 2021-10-26 MED ORDER — HUMALOG KWIKPEN 100 UNIT/ML ~~LOC~~ SOPN: 0.0000 [IU] | PEN_INJECTOR | Freq: Three times a day (TID) | SUBCUTANEOUS | 2 refills | Status: DC

## 2021-10-26 MED ORDER — INSULIN GLARGINE 100 UNIT/ML SOLOSTAR PEN: 35.0000 [IU] | PEN_INJECTOR | Freq: Every day | SUBCUTANEOUS | 3 refills | Status: DC

## 2021-10-26 MED ORDER — LEVOTHYROXINE SODIUM 150 MCG PO TABS: 150.0000 ug | ORAL_TABLET | Freq: Every day | ORAL | 1 refills | Status: DC

## 2021-10-26 MED ORDER — GABAPENTIN 300 MG PO CAPS: 300.0000 mg | ORAL_CAPSULE | Freq: Three times a day (TID) | ORAL | 0 refills | Status: DC

## 2021-10-26 MED ORDER — OXYCODONE HCL 5 MG PO TABS: 5.0000 mg | ORAL_TABLET | Freq: Four times a day (QID) | ORAL | 0 refills | Status: DC | PRN

## 2021-10-26 MED ORDER — GABAPENTIN 300 MG PO CAPS
300.0000 mg | ORAL_CAPSULE | Freq: Once | ORAL | Status: AC
  Administered 2021-10-26: 300 mg via ORAL
  Filled 2021-10-26: qty 1

## 2021-10-26 MED ORDER — OXYCODONE HCL 5 MG PO TABS
5.0000 mg | ORAL_TABLET | Freq: Four times a day (QID) | ORAL | Status: DC | PRN
  Administered 2021-10-26: 5 mg via ORAL
  Filled 2021-10-26: qty 1

## 2021-10-26 NOTE — Plan of Care (Signed)
°  Problem: Education: Goal: Ability to describe self-care measures that may prevent or decrease complications (Diabetes Survival Skills Education) will improve Outcome: Progressing   Problem: Coping: Goal: Ability to adjust to condition or change in health will improve Outcome: Progressing   Problem: Clinical Measurements: Goal: Ability to maintain clinical measurements within normal limits will improve Outcome: Progressing   Problem: Coping: Goal: Level of anxiety will decrease Outcome: Progressing   Problem: Safety: Goal: Ability to remain free from injury will improve Outcome: Progressing

## 2021-10-26 NOTE — Plan of Care (Signed)
°  Problem: Education: Goal: Ability to describe self-care measures that may prevent or decrease complications (Diabetes Survival Skills Education) will improve 10/26/2021 1728 by Mariann Laster, RN Outcome: Adequate for Discharge 10/26/2021 1404 by Mariann Laster, RN Outcome: Progressing Goal: Individualized Educational Video(s) Outcome: Adequate for Discharge   Problem: Coping: Goal: Ability to adjust to condition or change in health will improve 10/26/2021 1728 by Mariann Laster, RN Outcome: Adequate for Discharge 10/26/2021 1404 by Mariann Laster, RN Outcome: Progressing   Problem: Fluid Volume: Goal: Ability to maintain a balanced intake and output will improve Outcome: Adequate for Discharge   Problem: Health Behavior/Discharge Planning: Goal: Ability to identify and utilize available resources and services will improve Outcome: Adequate for Discharge Goal: Ability to manage health-related needs will improve Outcome: Adequate for Discharge   Problem: Metabolic: Goal: Ability to maintain appropriate glucose levels will improve Outcome: Adequate for Discharge   Problem: Nutritional: Goal: Maintenance of adequate nutrition will improve Outcome: Adequate for Discharge Goal: Progress toward achieving an optimal weight will improve Outcome: Adequate for Discharge   Problem: Skin Integrity: Goal: Risk for impaired skin integrity will decrease Outcome: Adequate for Discharge   Problem: Tissue Perfusion: Goal: Adequacy of tissue perfusion will improve Outcome: Adequate for Discharge   Problem: Education: Goal: Knowledge of General Education information will improve Description: Including pain rating scale, medication(s)/side effects and non-pharmacologic comfort measures Outcome: Adequate for Discharge   Problem: Health Behavior/Discharge Planning: Goal: Ability to manage health-related needs will improve Outcome: Adequate for Discharge   Problem: Clinical  Measurements: Goal: Ability to maintain clinical measurements within normal limits will improve 10/26/2021 1728 by Mariann Laster, RN Outcome: Adequate for Discharge 10/26/2021 1403 by Mariann Laster, RN Outcome: Progressing Goal: Will remain free from infection Outcome: Adequate for Discharge Goal: Diagnostic test results will improve Outcome: Adequate for Discharge Goal: Respiratory complications will improve Outcome: Adequate for Discharge Goal: Cardiovascular complication will be avoided Outcome: Adequate for Discharge   Problem: Activity: Goal: Risk for activity intolerance will decrease Outcome: Adequate for Discharge   Problem: Nutrition: Goal: Adequate nutrition will be maintained Outcome: Adequate for Discharge   Problem: Coping: Goal: Level of anxiety will decrease 10/26/2021 1728 by Mariann Laster, RN Outcome: Adequate for Discharge 10/26/2021 1403 by Mariann Laster, RN Outcome: Progressing   Problem: Elimination: Goal: Will not experience complications related to bowel motility Outcome: Adequate for Discharge Goal: Will not experience complications related to urinary retention Outcome: Adequate for Discharge   Problem: Pain Managment: Goal: General experience of comfort will improve Outcome: Adequate for Discharge   Problem: Safety: Goal: Ability to remain free from injury will improve 10/26/2021 1728 by Mariann Laster, RN Outcome: Adequate for Discharge 10/26/2021 1403 by Mariann Laster, RN Outcome: Progressing   Problem: Skin Integrity: Goal: Risk for impaired skin integrity will decrease Outcome: Adequate for Discharge

## 2021-10-26 NOTE — ED Notes (Signed)
Patient continues to take blood pressure cuff and pulse oximeter off, also took hospital I bracelet off

## 2021-10-26 NOTE — ED Notes (Signed)
Pt transitioned off insuling gtt and D5LR.

## 2021-10-26 NOTE — Care Management (Signed)
Consult for PCP and medication assistance. PCP on patient instruction sheet. Patient has Insurance, therefore is ineligible for medication assistance.

## 2021-10-26 NOTE — Discharge Summary (Signed)
Physician Discharge Summary   Patient: David Ramsey MRN: [id number] DOB: [birth year]  Admit date:     10/25/2021  Discharge date: 10/26/21  Discharge Physician: Oswald Hillock   PCP: Patient, No Pcp Per (Inactive)   Recommendations at discharge:   Follow-up [facility], will call to make an appointment  Discharge Diagnoses: Principal Problem:   DKA (diabetic ketoacidosis) ([place]) Active Problems:   Hypothyroidism (acquired)   Type 1 diabetes mellitus with hyperglycemia ([place])   AKI (acute kidney injury) ([place])   Diarrhea  Resolved Problems:   * No resolved hospital problems. *   Hospital Course: 26 year old male with a history of diabetes mellitus type 1, hypothyroidism, anxiety came with diarrhea.  Also has nausea vomiting diarrhea for past 1 week.  Patient noticed that his blood glucose was up.  He had difficulty acquiring insulin so he has been using Novolin 70/30.  In the ED he was found to be in DKA and started on IV insulin Endo tool.  Assessment and Plan:  Diabetic ketoacidosis -Resolved -Patient is off IV insulin -Tolerating carb modified diet  Diabetes mellitus type 1 -He has been out of his medications we will give prescription for Humalog and Lantus as requested by patient -I have told him to use Novolin 70/30 in the meantime, 15 units subcu twice daily till he gets his prescription filled -He can follow-up with PCP in 1 week  Diarrhea -Resolved -Patient did not have BM in the hospital  Acute kidney injury -Resolved with IV fluids  Chronic pain syndrome -Continue gabapentin 300 g p.o. 3 times daily -We will give oxycodone 5 mg p.o. every 6 hours as needed, total 15 tablets  Hypothyroidism -Continue Synthroid         Consultants:  Procedures performed:   Disposition: Home Diet recommendation: Carb modified diet Discharge Diet Orders (From admission, onward)     Start     Ordered   10/26/21 0000  Diet Carb Modified         10/26/21 1554           Carb modified diet  DISCHARGE MEDICATION: Allergies as of 10/26/2021       Reactions   Tape Rash   Blisters- can use kerlix tape        Medication List     STOP taking these medications    naproxen 375 MG tablet Commonly known as: NAPROSYN   naproxen sodium 220 MG tablet Commonly known as: ALEVE       TAKE these medications    acetaminophen 500 MG tablet Commonly known as: TYLENOL Take 1,000 mg by mouth every 6 (six) hours as needed for moderate pain or headache.   blood glucose meter kit and supplies Kit Dispense based on patient and insurance preference. Use up to four times daily as directed.   FLUoxetine 20 MG capsule Commonly known as: PROZAC Take 1 capsule (20 mg total) by mouth daily.   gabapentin 300 MG capsule Commonly known as: NEURONTIN Take 1 capsule (300 mg total) by mouth 3 (three) times daily.   HumaLOG KwikPen 100 UNIT/ML KwikPen Generic drug: insulin lispro Inject 0-9 Units into the skin 3 (three) times daily. Sliding scale insulin Less than 70 initiate hypoglycemia protocol 70-120  0 units 120-150 1 unit 151-200 2 units 201-250 3 units 251-300 5 units 301-350 7 units 351-400 9 units Greater than 400 call MD What changed:  how much to take when to take this additional instructions  insulin glargine 100 UNIT/ML Solostar Pen Commonly known as: LANTUS Inject 35 Units into the skin daily.   levothyroxine 150 MCG tablet Commonly known as: SYNTHROID Take 1 tablet (150 mcg total) by mouth daily.   oxyCODONE 5 MG immediate release tablet Commonly known as: Oxy IR/ROXICODONE Take 1 tablet (5 mg total) by mouth every 6 (six) hours as needed for severe pain.   Pen Needles 3/16" 31G X 5 MM Misc 1 Container by Does not apply route 4 (four) times daily -  before meals and at bedtime.   triamcinolone cream 0.1 % Commonly known as: KENALOG Apply topically 2 (two) times daily.        Follow-up [facility] up.   Why: please call and set up appointment to establish primary care MD Contact information: [address] [place] [address] [phone]                Discharge Exam: [name] Weights   10/25/21 1242  Weight: 72.6 kg   General-appears in no acute distress Heart-S1-S2, regular, no murmur auscultated Lungs-clear to auscultation bilaterally, no wheezing or crackles auscultated Abdomen-soft, nontender, no organomegaly Extremities-no edema in the lower extremities Neuro-alert, oriented x3, no focal deficit noted  Condition at discharge: good  The results of significant diagnostics from this hospitalization (including imaging, microbiology, ancillary and laboratory) are listed below for reference.   Imaging Studies: No results found.  Microbiology: Results for orders placed or performed during the hospital encounter of 10/25/21  Resp Panel by RT-PCR (Flu A&B, Covid) Nasopharyngeal Swab     Status: None   Collection Time: 10/25/21  1:49 PM   Specimen: Nasopharyngeal Swab; Nasopharyngeal(NP) swabs in vial transport medium  Result Value Ref Range Status   SARS Coronavirus 2 by RT PCR NEGATIVE NEGATIVE Final    Comment: (NOTE) SARS-CoV-2 target nucleic acids are NOT DETECTED.  The SARS-CoV-2 RNA is generally detectable in upper respiratory specimens during the acute phase of infection. The lowest concentration of SARS-CoV-2 viral copies this assay can detect is 138 copies/mL. A negative result does not preclude SARS-Cov-2 infection and should not be used as the sole basis for treatment or other patient management decisions. A negative result may occur with  improper specimen collection/handling, submission of specimen other than nasopharyngeal swab, presence of viral mutation(s) within the areas targeted by this assay, and inadequate number of viral copies(<138 copies/mL). A negative result must be  combined with clinical observations, patient history, and epidemiological information. The expected result is Negative.  Fact Sheet for Patients:  EntrepreneurPulse.com.au  Fact Sheet for Healthcare Providers:  IncredibleEmployment.be  This test is no t yet approved or cleared by the [place] FDA and  has been authorized for detection and/or diagnosis of SARS-CoV-2 by FDA under an Emergency Use Authorization (EUA). This EUA will remain  in effect (meaning this test can be used) for the duration of the COVID-19 declaration under Section 564(b)(1) of the Act, 21 U.S.C.section 360bbb-3(b)(1), unless the authorization is terminated  or revoked sooner.       Influenza A by PCR NEGATIVE NEGATIVE Final   Influenza B by PCR NEGATIVE NEGATIVE Final    Comment: (NOTE) The Xpert Xpress SARS-CoV-2/FLU/RSV plus assay is intended as an aid in the diagnosis of influenza from Nasopharyngeal swab specimens and should not be used as a sole basis for treatment. Nasal washings and aspirates are unacceptable for Xpert Xpress SARS-CoV-2/FLU/RSV testing.  Fact  Sheet for Patients: EntrepreneurPulse.com.au  Fact Sheet for Healthcare Providers: IncredibleEmployment.be  This test is not yet approved or cleared by the [place] FDA and has been authorized for detection and/or diagnosis of SARS-CoV-2 by FDA under an Emergency Use Authorization (EUA). This EUA will remain in effect (meaning this test can be used) for the duration of the COVID-19 declaration under Section 564(b)(1) of the Act, 21 U.S.C. section 360bbb-3(b)(1), unless the authorization is terminated or revoked.  Performed at [facility], [place] [address], [place], [place] [address]     Labs: CBC: Recent Labs  Lab 10/25/21 1323 10/25/21 1421 10/25/21 2213  WBC 8.0  --  10.2  HGB 15.8 15.6 12.7*  HCT 46.9 46.0 36.6*  MCV 90.5  --   87.4  PLT 412*  --  938   Basic Metabolic Panel: Recent Labs  Lab 10/25/21 1638 10/25/21 2213 10/26/21 0015 10/26/21 0450 10/26/21 0801  NA 131* 134*   134* 134* 134* 136  K 4.2 3.8   3.8 4.0 3.2* 3.6  CL 99 103   102 101 104 105  CO2 11* 21*   _0 GLUCOSE 407* 193*   191* 210* 196* 126*  BUN _1 CREATININE 1.39* 1.09   1.19 1.06 1.05 0.96  CALCIUM 8.7* 8.7*   8.6* 8.6* 8.6* 8.6*   Liver Function Tests: No results for input(s): AST, ALT, ALKPHOS, BILITOT, PROT, ALBUMIN in the last 168 hours. CBG: Recent Labs  Lab 10/26/21 0457 10/26/21 0611 10/26/21 0717 10/26/21 0904 10/26/21 1128  GLUCAP 161* 115* 111* 140* 161*    Discharge time spent: greater than 30 minutes.  Signed: Oswald Hillock, MD [facility] 10/26/2021

## 2021-11-04 ENCOUNTER — Inpatient Hospital Stay: Payer: BC Managed Care – PPO

## 2021-11-04 ENCOUNTER — Emergency Department: Payer: BC Managed Care – PPO

## 2021-11-04 ENCOUNTER — Inpatient Hospital Stay
Admission: EM | Admit: 2021-11-04 | Discharge: 2021-11-08 | DRG: 438 | Disposition: A | Discharge status: Home / Self Care | Payer: BC Managed Care – PPO | Attending: Internal Medicine | Admitting: Internal Medicine

## 2021-11-04 ENCOUNTER — Other Ambulatory Visit: Payer: Self-pay

## 2021-11-04 DIAGNOSIS — R1110 Vomiting, unspecified: Secondary | ICD-10-CM | POA: Diagnosis not present

## 2021-11-04 DIAGNOSIS — K859 Acute pancreatitis without necrosis or infection, unspecified: Principal | ICD-10-CM | POA: Diagnosis present

## 2021-11-04 DIAGNOSIS — R7989 Other specified abnormal findings of blood chemistry: Secondary | ICD-10-CM | POA: Diagnosis not present

## 2021-11-04 DIAGNOSIS — E1010 Type 1 diabetes mellitus with ketoacidosis without coma: Secondary | ICD-10-CM | POA: Diagnosis not present

## 2021-11-04 DIAGNOSIS — M79605 Pain in left leg: Secondary | ICD-10-CM | POA: Diagnosis not present

## 2021-11-04 DIAGNOSIS — N179 Acute kidney failure, unspecified: Secondary | ICD-10-CM | POA: Diagnosis not present

## 2021-11-04 DIAGNOSIS — Z794 Long term (current) use of insulin: Secondary | ICD-10-CM

## 2021-11-04 DIAGNOSIS — J189 Pneumonia, unspecified organism: Secondary | ICD-10-CM | POA: Diagnosis not present

## 2021-11-04 DIAGNOSIS — E063 Autoimmune thyroiditis: Secondary | ICD-10-CM | POA: Diagnosis present

## 2021-11-04 DIAGNOSIS — R0781 Pleurodynia: Secondary | ICD-10-CM | POA: Diagnosis not present

## 2021-11-04 DIAGNOSIS — K858 Other acute pancreatitis without necrosis or infection: Secondary | ICD-10-CM | POA: Diagnosis not present

## 2021-11-04 DIAGNOSIS — K861 Other chronic pancreatitis: Secondary | ICD-10-CM | POA: Diagnosis not present

## 2021-11-04 DIAGNOSIS — E1042 Type 1 diabetes mellitus with diabetic polyneuropathy: Secondary | ICD-10-CM | POA: Diagnosis not present

## 2021-11-04 DIAGNOSIS — E039 Hypothyroidism, unspecified: Secondary | ICD-10-CM

## 2021-11-04 DIAGNOSIS — R739 Hyperglycemia, unspecified: Secondary | ICD-10-CM | POA: Diagnosis not present

## 2021-11-04 DIAGNOSIS — Z91048 Other nonmedicinal substance allergy status: Secondary | ICD-10-CM

## 2021-11-04 DIAGNOSIS — R110 Nausea: Secondary | ICD-10-CM | POA: Diagnosis not present

## 2021-11-04 DIAGNOSIS — Z20822 Contact with and (suspected) exposure to covid-19: Secondary | ICD-10-CM | POA: Diagnosis present

## 2021-11-04 DIAGNOSIS — M79604 Pain in right leg: Secondary | ICD-10-CM | POA: Diagnosis not present

## 2021-11-04 DIAGNOSIS — K449 Diaphragmatic hernia without obstruction or gangrene: Secondary | ICD-10-CM | POA: Diagnosis not present

## 2021-11-04 DIAGNOSIS — E1065 Type 1 diabetes mellitus with hyperglycemia: Secondary | ICD-10-CM | POA: Diagnosis not present

## 2021-11-04 DIAGNOSIS — E860 Dehydration: Secondary | ICD-10-CM | POA: Diagnosis present

## 2021-11-04 DIAGNOSIS — E876 Hypokalemia: Secondary | ICD-10-CM

## 2021-11-04 DIAGNOSIS — R109 Unspecified abdominal pain: Secondary | ICD-10-CM | POA: Diagnosis not present

## 2021-11-04 DIAGNOSIS — Z28310 Unvaccinated for covid-19: Secondary | ICD-10-CM | POA: Diagnosis not present

## 2021-11-04 DIAGNOSIS — D72829 Elevated white blood cell count, unspecified: Secondary | ICD-10-CM | POA: Diagnosis present

## 2021-11-04 DIAGNOSIS — E1040 Type 1 diabetes mellitus with diabetic neuropathy, unspecified: Secondary | ICD-10-CM | POA: Diagnosis not present

## 2021-11-04 DIAGNOSIS — J188 Other pneumonia, unspecified organism: Secondary | ICD-10-CM

## 2021-11-04 DIAGNOSIS — Z79890 Hormone replacement therapy (postmenopausal): Secondary | ICD-10-CM | POA: Diagnosis not present

## 2021-11-04 DIAGNOSIS — R000 Tachycardia, unspecified: Secondary | ICD-10-CM | POA: Diagnosis not present

## 2021-11-04 DIAGNOSIS — I10 Essential (primary) hypertension: Secondary | ICD-10-CM | POA: Diagnosis not present

## 2021-11-04 DIAGNOSIS — R112 Nausea with vomiting, unspecified: Secondary | ICD-10-CM | POA: Diagnosis not present

## 2021-11-04 DIAGNOSIS — F17290 Nicotine dependence, other tobacco product, uncomplicated: Secondary | ICD-10-CM | POA: Diagnosis present

## 2021-11-04 DIAGNOSIS — R079 Chest pain, unspecified: Secondary | ICD-10-CM | POA: Diagnosis not present

## 2021-11-04 DIAGNOSIS — Z2839 Other underimmunization status: Secondary | ICD-10-CM | POA: Diagnosis not present

## 2021-11-04 DIAGNOSIS — Z833 Family history of diabetes mellitus: Secondary | ICD-10-CM

## 2021-11-04 DIAGNOSIS — E1110 Type 2 diabetes mellitus with ketoacidosis without coma: Secondary | ICD-10-CM | POA: Diagnosis present

## 2021-11-04 DIAGNOSIS — Z79899 Other long term (current) drug therapy: Secondary | ICD-10-CM | POA: Diagnosis not present

## 2021-11-04 DIAGNOSIS — J90 Pleural effusion, not elsewhere classified: Secondary | ICD-10-CM | POA: Diagnosis not present

## 2021-11-04 DIAGNOSIS — E1140 Type 2 diabetes mellitus with diabetic neuropathy, unspecified: Secondary | ICD-10-CM

## 2021-11-04 LAB — HEPATIC FUNCTION PANEL
ALT: 39 U/L (ref 0–44)
AST: 32 U/L (ref 15–41)
Albumin: 4.7 g/dL (ref 3.5–5.0)
Alkaline Phosphatase: 84 U/L (ref 38–126)
Bilirubin, Direct: 0.1 mg/dL (ref 0.0–0.2)
Indirect Bilirubin: 1.7 mg/dL — ABNORMAL HIGH (ref 0.3–0.9)
Total Bilirubin: 1.8 mg/dL — ABNORMAL HIGH (ref 0.3–1.2)
Total Protein: 8.4 g/dL — ABNORMAL HIGH (ref 6.5–8.1)

## 2021-11-04 LAB — BASIC METABOLIC PANEL
Anion gap: 24 — ABNORMAL HIGH (ref 5–15)
Anion gap: 18 — ABNORMAL HIGH (ref 5–15)
Anion gap: 14 (ref 5–15)
BUN: 22 mg/dL — ABNORMAL HIGH (ref 6–20)
BUN: 19 mg/dL (ref 6–20)
BUN: 17 mg/dL (ref 6–20)
CO2: 12 mmol/L — ABNORMAL LOW (ref 22–32)
CO2: 14 mmol/L — ABNORMAL LOW (ref 22–32)
CO2: 17 mmol/L — ABNORMAL LOW (ref 22–32)
Calcium: 9.6 mg/dL (ref 8.9–10.3)
Calcium: 9.2 mg/dL (ref 8.9–10.3)
Calcium: 9 mg/dL (ref 8.9–10.3)
Chloride: 100 mmol/L (ref 98–111)
Chloride: 106 mmol/L (ref 98–111)
Chloride: 108 mmol/L (ref 98–111)
Creatinine, Ser: 1.55 mg/dL — ABNORMAL HIGH (ref 0.61–1.24)
Creatinine, Ser: 1.29 mg/dL — ABNORMAL HIGH (ref 0.61–1.24)
Creatinine, Ser: 1.39 mg/dL — ABNORMAL HIGH (ref 0.61–1.24)
GFR, Estimated: >60 mL/min (ref >60)
GFR, Estimated: >60 mL/min (ref >60)
GFR, Estimated: >60 mL/min (ref >60)
Glucose, Bld: 302 mg/dL — ABNORMAL HIGH (ref 70–99)
Glucose, Bld: 236 mg/dL — ABNORMAL HIGH (ref 70–99)
Glucose, Bld: 190 mg/dL — ABNORMAL HIGH (ref 70–99)
Potassium: 4.6 mmol/L (ref 3.5–5.1)
Potassium: 5.1 mmol/L (ref 3.5–5.1)
Potassium: 4.3 mmol/L (ref 3.5–5.1)
Sodium: 136 mmol/L (ref 135–145)
Sodium: 138 mmol/L (ref 135–145)
Sodium: 139 mmol/L (ref 135–145)

## 2021-11-04 LAB — CBG MONITORING, ED
Glucose-Capillary: 366 mg/dL — ABNORMAL HIGH (ref 70–99)
Glucose-Capillary: 278 mg/dL — ABNORMAL HIGH (ref 70–99)
Glucose-Capillary: 171 mg/dL — ABNORMAL HIGH (ref 70–99)
Glucose-Capillary: 234 mg/dL — ABNORMAL HIGH (ref 70–99)

## 2021-11-04 LAB — CBC
HCT: 46.2 % (ref 39.0–52.0)
Hemoglobin: 15.5 g/dL (ref 13.0–17.0)
MCH: 30 pg (ref 26.0–34.0)
MCHC: 33.5 g/dL (ref 30.0–36.0)
MCV: 89.5 fL (ref 80.0–100.0)
Platelets: 482 10*3/uL — ABNORMAL HIGH (ref 150–400)
RBC: 5.16 MIL/uL (ref 4.22–5.81)
RDW: 12.6 % (ref 11.5–15.5)
WBC: 14.6 10*3/uL — ABNORMAL HIGH (ref 4.0–10.5)
nRBC: 0 % (ref 0.0–0.2)

## 2021-11-04 LAB — URINE DRUG SCREEN, QUALITATIVE (ARMC ONLY)
Amphetamines, Ur Screen: NOT DETECTED
Barbiturates, Ur Screen: NOT DETECTED
Benzodiazepine, Ur Scrn: NOT DETECTED
Cannabinoid 50 Ng, Ur ~~LOC~~: NOT DETECTED
Cocaine Metabolite,Ur ~~LOC~~: NOT DETECTED
MDMA (Ecstasy)Ur Screen: NOT DETECTED
Methadone Scn, Ur: NOT DETECTED
Opiate, Ur Screen: POSITIVE — AB
Phencyclidine (PCP) Ur S: NOT DETECTED
Tricyclic, Ur Screen: NOT DETECTED

## 2021-11-04 LAB — URINALYSIS, ROUTINE W REFLEX MICROSCOPIC
Bacteria, UA: NONE SEEN
Bilirubin Urine: NEGATIVE
Glucose, UA: >=500 mg/dL — AB
Hgb urine dipstick: NEGATIVE
Ketones, ur: 80 mg/dL — AB
Leukocytes,Ua: NEGATIVE
Nitrite: NEGATIVE
Protein, ur: NEGATIVE mg/dL
Specific Gravity, Urine: 1.025 (ref 1.005–1.030)
Squamous Epithelial / HPF: NONE SEEN (ref 0–5)
WBC, UA: NONE SEEN WBC/hpf (ref 0–5)
pH: 5 (ref 5.0–8.0)

## 2021-11-04 LAB — BLOOD GAS, VENOUS
Acid-base deficit: 13.6 mmol/L — ABNORMAL HIGH (ref 0.0–2.0)
Bicarbonate: 12.6 mmol/L — ABNORMAL LOW (ref 20.0–28.0)
O2 Saturation: 91.1 %
Patient temperature: 37
pCO2, Ven: 30 mmHg — ABNORMAL LOW (ref 44–60)
pH, Ven: 7.23 — ABNORMAL LOW (ref 7.25–7.43)
pO2, Ven: 65 mmHg — ABNORMAL HIGH (ref 32–45)

## 2021-11-04 LAB — GLUCOSE, CAPILLARY
Glucose-Capillary: 216 mg/dL — ABNORMAL HIGH (ref 70–99)
Glucose-Capillary: 209 mg/dL — ABNORMAL HIGH (ref 70–99)
Glucose-Capillary: 164 mg/dL — ABNORMAL HIGH (ref 70–99)

## 2021-11-04 LAB — LIPASE, BLOOD: Lipase: 254 U/L — ABNORMAL HIGH (ref 11–51)

## 2021-11-04 LAB — RESP PANEL BY RT-PCR (FLU A&B, COVID) ARPGX2
Influenza A by PCR: NEGATIVE
Influenza B by PCR: NEGATIVE
SARS Coronavirus 2 by RT PCR: NEGATIVE

## 2021-11-04 LAB — MRSA NEXT GEN BY PCR, NASAL: MRSA by PCR Next Gen: NOT DETECTED

## 2021-11-04 LAB — BETA-HYDROXYBUTYRIC ACID
Beta-Hydroxybutyric Acid: >8 mmol/L — ABNORMAL HIGH (ref 0.05–0.27)
Beta-Hydroxybutyric Acid: 4.93 mmol/L — ABNORMAL HIGH (ref 0.05–0.27)

## 2021-11-04 LAB — TROPONIN I (HIGH SENSITIVITY): Troponin I (High Sensitivity): 3 ng/L (ref <18)

## 2021-11-04 LAB — ETHANOL: Alcohol, Ethyl (B): <10 mg/dL (ref <10)

## 2021-11-04 MED ORDER — POTASSIUM CHLORIDE 10 MEQ/100ML IV SOLN: 10.0000 meq | INTRAVENOUS | Status: DC

## 2021-11-04 MED ORDER — LACTATED RINGERS IV SOLN: INTRAVENOUS | Status: DC

## 2021-11-04 MED ORDER — INSULIN REGULAR(HUMAN) IN NACL 100-0.9 UT/100ML-% IV SOLN
INTRAVENOUS | Status: DC
  Administered 2021-11-04: 8.5 [IU]/h via INTRAVENOUS
  Filled 2021-11-04 (×2): qty 100

## 2021-11-04 MED ORDER — IOHEXOL 300 MG/ML  SOLN
100.0000 mL | Freq: Once | INTRAMUSCULAR | Status: AC | PRN
  Administered 2021-11-04: 100 mL via INTRAVENOUS

## 2021-11-04 MED ORDER — DEXTROSE IN LACTATED RINGERS 5 % IV SOLN: INTRAVENOUS | Status: DC

## 2021-11-04 MED ORDER — MORPHINE SULFATE (PF) 4 MG/ML IV SOLN
4.0000 mg | Freq: Once | INTRAVENOUS | Status: AC
  Administered 2021-11-04: 4 mg via INTRAVENOUS
  Filled 2021-11-04: qty 1

## 2021-11-04 MED ORDER — ENOXAPARIN SODIUM 40 MG/0.4ML IJ SOSY
40.0000 mg | PREFILLED_SYRINGE | INTRAMUSCULAR | Status: DC
  Administered 2021-11-04 – 2021-11-07 (×4): 40 mg via SUBCUTANEOUS
  Filled 2021-11-04 (×4): qty 0.4

## 2021-11-04 MED ORDER — INSULIN GLARGINE-YFGN 100 UNIT/ML ~~LOC~~ SOLN
20.0000 [IU] | Freq: Once | SUBCUTANEOUS | Status: DC
  Filled 2021-11-04: qty 0.2

## 2021-11-04 MED ORDER — INSULIN REGULAR(HUMAN) IN NACL 100-0.9 UT/100ML-% IV SOLN: INTRAVENOUS | Status: DC

## 2021-11-04 MED ORDER — LEVOTHYROXINE SODIUM 50 MCG PO TABS
150.0000 ug | ORAL_TABLET | Freq: Every day | ORAL | Status: DC
  Administered 2021-11-05 – 2021-11-08 (×4): 150 ug via ORAL
  Filled 2021-11-04 (×3): qty 3
  Filled 2021-11-04: qty 1

## 2021-11-04 MED ORDER — SODIUM CHLORIDE 0.9 % IV SOLN
1000.0000 mL | Freq: Once | INTRAVENOUS | Status: AC
  Administered 2021-11-04: 1000 mL via INTRAVENOUS

## 2021-11-04 MED ORDER — POTASSIUM CHLORIDE 10 MEQ/100ML IV SOLN
10.0000 meq | INTRAVENOUS | Status: AC
  Administered 2021-11-04 (×2): 10 meq via INTRAVENOUS
  Filled 2021-11-04 (×2): qty 100

## 2021-11-04 MED ORDER — ONDANSETRON HCL 4 MG/2ML IJ SOLN
4.0000 mg | Freq: Once | INTRAMUSCULAR | Status: AC
  Administered 2021-11-04: 4 mg via INTRAVENOUS
  Filled 2021-11-04: qty 2

## 2021-11-04 MED ORDER — DEXTROSE 50 % IV SOLN: 0.0000 mL | INTRAVENOUS | Status: DC | PRN

## 2021-11-04 MED ORDER — MORPHINE SULFATE (PF) 2 MG/ML IV SOLN
2.0000 mg | INTRAVENOUS | Status: AC | PRN
  Administered 2021-11-05 (×3): 2 mg via INTRAVENOUS
  Filled 2021-11-04 (×3): qty 1

## 2021-11-04 MED ORDER — ONDANSETRON HCL 4 MG/2ML IJ SOLN: 4.0000 mg | Freq: Once | INTRAMUSCULAR | Status: DC

## 2021-11-04 MED ORDER — FENTANYL CITRATE PF 50 MCG/ML IJ SOSY: 12.5000 ug | PREFILLED_SYRINGE | INTRAMUSCULAR | Status: DC | PRN

## 2021-11-04 MED ORDER — DROPERIDOL 2.5 MG/ML IJ SOLN
2.5000 mg | Freq: Once | INTRAMUSCULAR | Status: AC
  Administered 2021-11-04: 2.5 mg via INTRAVENOUS
  Filled 2021-11-04: qty 2

## 2021-11-04 MED ORDER — GABAPENTIN 300 MG PO CAPS
300.0000 mg | ORAL_CAPSULE | Freq: Three times a day (TID) | ORAL | Status: DC
  Administered 2021-11-04 – 2021-11-08 (×11): 300 mg via ORAL
  Filled 2021-11-04 (×11): qty 1

## 2021-11-04 MED ORDER — LACTATED RINGERS IV BOLUS
1000.0000 mL | Freq: Once | INTRAVENOUS | Status: AC
Start: 2021-11-04 — End: 2021-11-04
  Administered 2021-11-04: 1000 mL via INTRAVENOUS

## 2021-11-04 MED ORDER — CHLORHEXIDINE GLUCONATE CLOTH 2 % EX PADS
6.0000 | MEDICATED_PAD | Freq: Every day | CUTANEOUS | Status: DC
  Administered 2021-11-04 – 2021-11-06 (×2): 6 via TOPICAL

## 2021-11-04 NOTE — ED Triage Notes (Signed)
Pt comes via EMS with c/o N\/V since 6am. Pt states he is diabetic and hx of DKA. Pt CBG 330 per EMS. Pt vomiting in room at this time.   VSS

## 2021-11-04 NOTE — ED Notes (Signed)
Called EDP re: VBG results and pt status. He is on way to see pt.

## 2021-11-04 NOTE — ED Notes (Signed)
CBG 366 

## 2021-11-04 NOTE — ED Notes (Signed)
Updated David Ramsey in ICU regarding blood redraw, BGL, and insulin adjustment.

## 2021-11-04 NOTE — Plan of Care (Signed)
°  Problem: Education: Goal: Knowledge of General Education information will improve Description: Including pain rating scale, medication(s)/side effects and non-pharmacologic comfort measures Outcome: Not Progressing Note: Patient is drowsy, unable to complete patient profile. Patient arouses to verbal and physical stimuli. No complaints of pain. Skin intact. Insulin drip in place.

## 2021-11-04 NOTE — ED Notes (Signed)
Had to turn down potassium as it was burning pt and pt was screaming.

## 2021-11-04 NOTE — ED Notes (Signed)
Sent med message to have insulin verified.

## 2021-11-04 NOTE — ED Notes (Signed)
Pt in CT.

## 2021-11-04 NOTE — H&P (Addendum)
History and Physical   David Ramsey:[id number] DOB: 04-24-1996 DOA: 11/04/2021  PCP: Patient, No Pcp Per (Inactive)  Outpatient Specialists: patient is looking for endocrinologist Patient coming from: Home via EMS  I have personally briefly reviewed patient's old medical records in [place].  Chief Concern: Nausea, vomiting  HPI: David Ramsey is a 26 year old male with history of hypothyroid, insulin-dependent diabetes mellitus, chronic pancreatitis, presents emergency department for chief concerns of nausea and vomiting.  Initial vitals in the emergency department show temperature of 98, respiration rate of 30 and improved to 20, heart rate of 105, blood pressure 140/93, SPO2 of 99% on room air.  VBG was 7.2 12/05/1963.  Serum sodium 136, potassium 4.6, chloride 100, bicarb 12, nonfasting blood glucose 302, BUN of 22, serum creatinine of 1.55, anion gap elevated at 24.  Lipase was elevated at 254.  WBC elevated at 14.6, hemoglobin 15.5, platelets of 482.  Beta hydroxybutyrate elevated at greater than 8.0.  CT of abdomen and pelvis read as concerning for acute pancreatitis.  ED treatment: Droperidol 2.5 mg IV, morphine 4 mg IV, ondansetron 4 mg IV.  Patient received sodium chloride 1 L bolus, LR 1 L bolus.  Patient was started on LR infusion at 125 mL/h.  Patient received potassium chloride 10 mill equivalent x2.  Patient was started on insulin GTT.  At bedside, patient is able to tell me his name, age, and current calendar year 2023.  He appears sleepy and tired.  He is fully arousable with verbal stimuli, and follows commands.  He endorses occasional EtOH use.  Per spouse at around 6 am, he vomited 6-7x. He then fell asleep and stayed asleep until he needed to vomit. Initially the vomitus was 'chunky orange' and then became dark green in color. He couldn't keep liquid down.  Per spouse his last etoh drink was about 3-4 days ago. He went to a bar with his friends.  Spouse was not there. He vapes daily.   He denies known fever, diarrhea, shortness of breath, chest pain.   Spouse states he did not take his insulin on day of admission. His blood glucose check at home was 238. Two weeks ago, he was sick with a gi 'bug' and it broought on dka.   Social history: Lives at home with his wife.  He endorses having children.  He endorses current occasional EtOH use. He works for [employer].   Vaccination history: He is not vaccinated for covid 19 or influenza.   ROS: Constitutional: no weight change, no fever ENT/Mouth: no sore throat, no rhinorrhea Eyes: no eye pain, no vision changes Cardiovascular: no chest pain, no dyspnea,  no edema, no palpitations Respiratory: no cough, no sputum, no wheezing Gastrointestinal: no nausea, no vomiting, no diarrhea, no constipation Genitourinary: no urinary incontinence, no dysuria, no hematuria Musculoskeletal: no arthralgias, no myalgias Skin: no skin lesions, no pruritus, Neuro: + weakness, no loss of consciousness, no syncope Psych: no anxiety, no depression, + decrease appetite Heme/Lymph: no bruising, no bleeding  ED Course: Discussed with emergency medicine provider, patient requiring hospitalization for chief concerns of DKA.  Assessment/Plan  Principal Problem:   DKA (diabetic ketoacidosis) ([place]) Active Problems:   Hypothyroid   Type 1 diabetes mellitus with hyperglycemia (HCC)   AKI (acute kidney injury) ([place])   DKA, type 1, not at goal [employer] [facility] [employer])   Nausea & vomiting   Leukocytosis   Acute pancreatitis  * DKA (diabetic ketoacidosis) (HCC) Anion gap metabolic acidosis  Assessment & Plan - Etiology work-up in progress, portable chest x-ray ordered, UDS, ethanol level - Troponin initial level is in process - N.p.o. until patient has completed 2 hours of insulin subcutaneous bridge - Continue insulin GTT at which time we will bridge with insulin subcutaneous - Continue aggressive fluid  administration - Admit to stepdown, inpatient   Acute pancreatitis Assessment & Plan - Etiology work-up in progress, UDS, ethanol level obtained - Both patient and his spouse endorses that patient occasionally drinks EtOH - Extensive counseling with patient that given his prior history of acute pancreatitis and chronic pancreatitis, patient will need to stop drinking alcohol completely - Morphine 2 mg IV every 4 hours as needed for moderate pain; fentanyl 12.5 mcg IV every 3 hours as needed for severe pain, 4 doses ordered - Continue aggressive fluid administration  Hypothyroid Assessment & Plan - Acquired - Resumed home levothyroxine 150 mcg daily before breakfast  AKI (acute kidney injury) ([place]) Assessment & Plan - Serum creatinine is 1.55, compared to 0.96 on 10/26/2021 - Presumed secondary to DKA - Treat DKA  Leukocytosis-likely reactive and or volume contraction given dehydration state  Patient met SIRS criteria however this is secondary to DKA in setting of acute on chronic pancreatitis - Low clinical suspicion of infectious etiology at this time  Chart reviewed.   DVT prophylaxis: Enoxaparin Code Status: Full code Diet: N.p.o. Family Communication: Updated spouse over the phone Disposition Plan: Pending clinical course Consults called: None at this time Admission status: Stepdown, inpatient  Past Medical History:  Diagnosis Date   Diabetes mellitus without complication ([place])    INSULIN DEPENDENT   SINCE AGE 33   DKA (diabetic ketoacidoses) 02/17/2017   Hashimoto's disease    MRSA infection    Pneumonia    Liver contusion, lung effusion, sepsis multiorgan failure, no clear source   Past Surgical History:  Procedure Laterality Date   CHEST TUBE INSERTION     Social History:  reports that he quit smoking about 4 years ago. His smoking use included cigarettes. He has a 2.00 pack-year smoking history. He has never used smokeless tobacco. He reports current alcohol  use. He reports that he does not use drugs.  Allergies  Allergen Reactions   Tape Rash    Blisters- can use kerlix tape   Family History  Problem Relation Age of Onset   Diabetes Cousin    Heart disease Other    Cancer Other    Thyroid disease Other    Healthy Mother    Family history: Family history reviewed and not pertinent  Prior to Admission medications   Medication Sig Start Date End Date Taking? Authorizing Provider  acetaminophen (TYLENOL) 500 MG tablet Take 1,000 mg by mouth every 6 (six) hours as needed for moderate pain or headache.    [provider]  blood glucose meter kit and supplies KIT Dispense based on patient and insurance preference. Use up to four times daily as directed. 11/03/20   Raiford Noble Latif, DO  FLUoxetine (PROZAC) 20 MG capsule Take 1 capsule (20 mg total) by mouth daily. 11/03/20 10/25/21  Raiford Noble Latif, DO  gabapentin (NEURONTIN) 300 MG capsule Take 1 capsule (300 mg total) by mouth 3 (three) times daily. 10/26/21   Oswald Hillock, MD  HUMALOG KWIKPEN 100 UNIT/ML KwikPen Inject 0-9 Units into the skin 3 (three) times daily. Sliding scale insulin Less than 70 initiate hypoglycemia protocol 70-120  0 units 120-150 1 unit 151-200 2 units 201-250 3  units 251-300 5 units 301-350 7 units 351-400 9 units Greater than 400 call MD 10/26/21   Oswald Hillock, MD  insulin glargine (LANTUS) 100 UNIT/ML Solostar Pen Inject 35 Units into the skin daily. 10/26/21   Oswald Hillock, MD  Insulin Pen Needle (PEN NEEDLES 3/16") 31G X 5 MM MISC 1 Container by Does not apply route 4 (four) times daily -  before meals and at bedtime. 11/03/20   Raiford Noble Latif, DO  levothyroxine (SYNTHROID) 150 MCG tablet Take 1 tablet (150 mcg total) by mouth daily. 10/26/21 12/25/21  Oswald Hillock, MD  oxyCODONE (OXY IR/ROXICODONE) 5 MG immediate release tablet Take 1 tablet (5 mg total) by mouth every 6 (six) hours as needed for severe pain. 10/26/21   Oswald Hillock, MD   triamcinolone (KENALOG) 0.1 % Apply topically 2 (two) times daily. 11/03/20   Raiford Noble Latif, DO  pantoprazole (PROTONIX) 40 MG tablet Take 1 tablet (40 mg total) by mouth daily. 02/18/17 06/06/20  Jonetta Osgood, MD   Physical Exam: Vitals:   11/04/21 1730 11/04/21 1800 11/04/21 1830 11/04/21 1900  BP: (!) 157/97 (!) 156/97 122/80 133/85  Pulse: (!) 123 (!) 122 (!) 109 (!) 111  Resp: 18 17 17 15   Temp:      SpO2: 98% 98% 99% 98%   Constitutional: appears age-appropriate, frail, NAD, calm, comfortable Eyes: PERRL, lids and conjunctivae normal ENMT: Mucous membranes are moist. Posterior pharynx clear of any exudate or lesions. Age-appropriate dentition. Hearing appropriate Neck: normal, supple, no masses, no thyromegaly Respiratory: clear to auscultation bilaterally, no wheezing, no crackles. Normal respiratory effort. No accessory muscle use.  Cardiovascular: Regular rate and rhythm, no murmurs / rubs / gallops. No extremity edema. 2+ pedal pulses. No carotid bruits.  Abdomen: no tenderness, no masses palpated, no hepatosplenomegaly. Bowel sounds positive.  Musculoskeletal: no clubbing / cyanosis. No joint deformity upper and lower extremities. Good ROM, no contractures, no atrophy. Normal muscle tone.  Skin: no rashes, lesions, ulcers. No induration Neurologic: Sensation intact. Strength 5/5 in all 4.  Psychiatric: Normal judgment and insight. Alert and oriented x 3. Normal mood.   EKG: independently reviewed, showing sinus tachycardia with rate of 103, QTc 442  Chest x-ray on Admission: I personally reviewed and I agree with radiologist reading as below.  CT ABDOMEN PELVIS W CONTRAST  Result Date: 11/04/2021 CLINICAL DATA:  Abdominal pain, acute, nonlocalized. Nausea and vomiting. EXAM: CT ABDOMEN AND PELVIS WITH CONTRAST TECHNIQUE: Multidetector CT imaging of the abdomen and pelvis was performed using the standard protocol following bolus administration of intravenous  contrast. RADIATION DOSE REDUCTION: This exam was performed according to the departmental dose-optimization program which includes automated exposure control, adjustment of the mA and/or kV according to patient size and/or use of iterative reconstruction technique. CONTRAST:  154m OMNIPAQUE IOHEXOL 300 MG/ML  SOLN COMPARISON:  12/04/2016 FINDINGS: Lower chest: Small patchy densities at the right lung base are suggestive for atelectasis. No pleural effusions. Hepatobiliary: Normal appearance of the liver, gallbladder and portal venous system. Pancreas: Small calcifications involving the pancreatic head and uncinate process are suggestive for prior inflammation. Edema and low-density around the pancreatic body best seen on sequence 2 image 25 and sequence 5 image 43. No significant pancreatic duct dilatation. Spleen: Normal in size without focal abnormality. Adrenals/Urinary Tract: Normal adrenal glands. Urinary bladder is mildly distended. Contrast in both renal collecting systems. No hydronephrosis. Small amount of streak artifact in the left kidney related to a structure external to  the patient. No suspicious renal lesions. Stomach/Bowel: Normal appearance of the stomach. Moderate amount of stool in the colon. There is no evidence for acute bowel inflammation. Normal appendix. Vascular/Lymphatic: No significant vascular findings are present. No enlarged abdominal or pelvic lymph nodes. Reproductive: Prostate is unremarkable. Other: Negative for free fluid.  Negative for free air. Musculoskeletal: No acute bone abnormality. IMPRESSION: 1. Peripancreatic edema around the pancreatic body. Findings are concerning for acute pancreatitis. 2. Calcifications at the pancreatic head and uncinate process are compatible with prior pancreatic inflammation. 3. Urinary bladder distension. Electronically Signed   By: Markus Daft M.D.   On: 11/04/2021 16:34   Portable chest x-ray (1 view)  Result Date: 11/04/2021 CLINICAL DATA:   Nausea and vomiting, diabetes, hyperglycemia EXAM: PORTABLE CHEST 1 VIEW COMPARISON:  03/26/2021 FINDINGS: Single frontal view of the chest demonstrates an unremarkable cardiac silhouette. No acute airspace disease, effusion, or pneumothorax. No acute bony abnormalities. IMPRESSION: 1. No acute intrathoracic process. Electronically Signed   By: Randa Ngo M.D.   On: 11/04/2021 18:40    Labs on Admission: I have personally reviewed following labs  CBC: Recent Labs  Lab 11/04/21 1439  WBC 14.6*  HGB 15.5  HCT 46.2  MCV 89.5  PLT 681*   Basic Metabolic Panel: Recent Labs  Lab 11/04/21 1439  NA 136  K 4.6  CL 100  CO2 12*  GLUCOSE 302*  BUN 22*  CREATININE 1.55*  CALCIUM 9.6   GFR: Estimated Creatinine Clearance: 66.3 mL/min (A) (by C-G formula based on SCr of 1.55 mg/dL (H)).  Liver Function Tests: Recent Labs  Lab 11/04/21 1446  AST 32  ALT 39  ALKPHOS 84  BILITOT 1.8*  PROT 8.4*  ALBUMIN 4.7   Recent Labs  Lab 11/04/21 1446  LIPASE 254*   CBG: Recent Labs  Lab 11/04/21 1616 11/04/21 1722 11/04/21 1822  GLUCAP 366* 278* 171*   Urine analysis:    Component Value Date/Time   COLORURINE STRAW (A) 10/25/2021 1638   APPEARANCEUR CLEAR 10/25/2021 1638   LABSPEC 1.016 10/25/2021 1638   PHURINE 5.0 10/25/2021 1638   GLUCOSEU >=500 (A) 10/25/2021 1638   HGBUR NEGATIVE 10/25/2021 1638   BILIRUBINUR NEGATIVE 10/25/2021 1638   KETONESUR 80 (A) 10/25/2021 1638   PROTEINUR NEGATIVE 10/25/2021 1638   NITRITE NEGATIVE 10/25/2021 1638   LEUKOCYTESUR NEGATIVE 10/25/2021 1638   CRITICAL CARE Performed by: Briant Cedar Jarvis Knodel  Total critical care time: 35 minutes  Critical care time was exclusive of separately billable procedures and treating other patients.  Critical care was necessary to treat or prevent imminent or life-threatening deterioration. Endocrine crisis  Critical care was time spent personally by me on the following activities: development of treatment  plan with patient and/or surrogate as well as nursing, discussions with consultants, evaluation of patient's response to treatment, examination of patient, obtaining history from patient or surrogate, ordering and performing treatments and interventions, ordering and review of laboratory studies, ordering and review of radiographic studies, pulse oximetry and re-evaluation of patient's condition.  Dr. Tobie Poet [facility]  If 7PM-7AM, please contact overnight-coverage provider If 7AM-7PM, please contact day coverage provider [email or web address]  11/04/2021, 7:12 PM

## 2021-11-04 NOTE — ED Notes (Signed)
Pt was calling out "help, help!" But not verbalizing what he needed. Pt had endorsed pain earlier. EDP placed order for pain med.

## 2021-11-04 NOTE — ED Notes (Signed)
Pt groaning, nods head when asked if has pain in chest and abdomen but unable to assign a number. Pt also intermittently speaking coherently then again groaning. Told pt will ask for pain meds from provider.

## 2021-11-04 NOTE — ED Notes (Signed)
Pt sleeping after droperidol. Potassium turned back to 100 mL/hr and LR bolus placed on pump to infuse more quickly and dilute potassium.

## 2021-11-04 NOTE — ED Notes (Signed)
Report received from Reina, RN 

## 2021-11-04 NOTE — Assessment & Plan Note (Addendum)
-   Etiology work-up in progress, UDS, ethanol level obtained - Both patient and his spouse endorses that patient occasionally drinks EtOH - Extensive counseling with patient that given his prior history of acute pancreatitis and chronic pancreatitis, patient will need to stop drinking alcohol completely - Morphine 2 mg IV every 4 hours as needed for moderate pain; fentanyl 12.5 mcg IV every 3 hours as needed for severe pain, 4 doses ordered

## 2021-11-04 NOTE — ED Notes (Signed)
EMS IV not working to draw blood, will start new line. Meds given.

## 2021-11-04 NOTE — Assessment & Plan Note (Addendum)
-   Etiology work-up in progress, portable chest x-ray ordered, UDS, ethanol level - Troponin initial level is in process - N.p.o. - Continue insulin GTT, aggressive fluid - Admit to stepdown, inpatient

## 2021-11-04 NOTE — ED Provider Notes (Signed)
[facility] Provider Note    Event Date/Time   First MD Initiated Contact with Patient 11/04/21 1503     (approximate)   History   Hyperglycemia   HPI  David Ramsey is a 26 y.o. male with history of type 1 diabetes, recurrent DKA, here with nausea, vomiting.  The patient states that over the last 12 hours, he is developed grossly worsening nausea, vomiting, and has had difficulty tolerating p.o.  He was just admitted fairly recently for DKA.  States he has been taking his insulin.  He describes pain "all over" as well as epigastric burning/heartburn sensation and vomiting.  He states he gets vomiting when he goes into DKA.  Denies any diarrhea.  No known specific sick contacts.  States has been unable to keep anything down for the last 24 hours.  He said polyuria.  He feels dry mouth.  No other complaints.  No known fevers.     Physical Exam   Triage Vital Signs: ED Triage Vitals  Enc Vitals Group     BP 11/04/21 1433 134/78     Pulse Rate 11/04/21 1433 97     Resp 11/04/21 1433 18     Temp 11/04/21 1433 98 F (36.7 C)     Temp src --      SpO2 11/04/21 1433 99 %     Weight --      Height --      Head Circumference --      Peak Flow --      Pain Score 11/04/21 1432 5     Pain Loc --      Pain Edu? --      Excl. in GC? --     Most recent vital signs: Vitals:   11/04/21 1630 11/04/21 1700  BP: (!) 150/91 (!) 163/101  Pulse: (!) 122 (!) 128  Resp: 13 20  Temp:    SpO2: 97% 99%     General: Awake, no distress.  CV:  Tachycardic, no murmurs or rubs.  Normal cap refill. Resp:  Tachypneic but with clear breath sounds. Abd:  No distention.  Mild diffuse tenderness, no specific rebound or guarding. Other:  Dry mucous membranes.   ED Results / Procedures / Treatments   Labs (all labs ordered are listed, but only abnormal results are displayed) Labs Reviewed  BASIC METABOLIC PANEL - Abnormal; Notable for the following components:       Result Value   CO2 12 (*)    Glucose, Bld 302 (*)    BUN 22 (*)    Creatinine, Ser 1.55 (*)    Anion gap 24 (*)    All other components within normal limits  CBC - Abnormal; Notable for the following components:   WBC 14.6 (*)    Platelets 482 (*)    All other components within normal limits  BLOOD GAS, VENOUS - Abnormal; Notable for the following components:   pH, Ven 7.23 (*)    pCO2, Ven 30 (*)    pO2, Ven 65 (*)    Bicarbonate 12.6 (*)    Acid-base deficit 13.6 (*)    All other components within normal limits  BETA-HYDROXYBUTYRIC ACID - Abnormal; Notable for the following components:   Beta-Hydroxybutyric Acid >8.00 (*)    All other components within normal limits  HEPATIC FUNCTION PANEL - Abnormal; Notable for the following components:   Total Protein 8.4 (*)    Total Bilirubin 1.8 (*)  Indirect Bilirubin 1.7 (*)    All other components within normal limits  LIPASE, BLOOD - Abnormal; Notable for the following components:   Lipase 254 (*)    All other components within normal limits  CBG MONITORING, ED - Abnormal; Notable for the following components:   Glucose-Capillary 366 (*)    All other components within normal limits  CBG MONITORING, ED - Abnormal; Notable for the following components:   Glucose-Capillary 278 (*)    All other components within normal limits  URINALYSIS, ROUTINE W REFLEX MICROSCOPIC  BASIC METABOLIC PANEL  BASIC METABOLIC PANEL  BETA-HYDROXYBUTYRIC ACID  BASIC METABOLIC PANEL  TROPONIN I (HIGH SENSITIVITY)  TROPONIN I (HIGH SENSITIVITY)     EKG Sinus tachycardia, ventricular 103.  PR 116, QRS 90, QTc 442.  Nonspecific T wave changes.  No acute ST elevations or depressions.  No EKG evidence of acute ischemia or infarct.   RADIOLOGY CT A/P: Peripancreatic edema c/w pancreatitis   I also independently reviewed and agree wit radiologist interpretations.   PROCEDURES:  Critical Care performed: Yes, see critical care procedure  note(s)  .Critical Care Performed by: Shaune Pollack, MD Authorized by: Shaune Pollack, MD   Critical care provider statement:    Critical care time (minutes):  30   Critical care time was exclusive of:  Separately billable procedures and treating other patients   Critical care was necessary to treat or prevent imminent or life-threatening deterioration of the following conditions:  Cardiac failure, circulatory failure, respiratory failure, dehydration and metabolic crisis   Critical care was time spent personally by me on the following activities:  Development of treatment plan with patient or surrogate, discussions with consultants, evaluation of patient's response to treatment, examination of patient, ordering and review of laboratory studies, ordering and review of radiographic studies, ordering and performing treatments and interventions, pulse oximetry, re-evaluation of patient's condition and review of old charts    MEDICATIONS ORDERED IN ED: Medications  lactated ringers infusion (has no administration in time range)  dextrose 5 % in lactated ringers infusion (has no administration in time range)  dextrose 50 % solution 0-50 mL (has no administration in time range)  insulin regular, human (MYXREDLIN) 100 units/ 100 mL infusion (7 Units/hr Intravenous Rate/Dose Change 11/04/21 1722)  potassium chloride 10 mEq in 100 mL IVPB (10 mEq Intravenous New Bag/Given 11/04/21 1719)  morphine (PF) 4 MG/ML injection 4 mg (has no administration in time range)  0.9 %  sodium chloride infusion (1,000 mLs Intravenous New Bag/Given 11/04/21 1459)  ondansetron (ZOFRAN) injection 4 mg (4 mg Intravenous Given 11/04/21 1500)  lactated ringers bolus 1,000 mL (0 mLs Intravenous Stopped 11/04/21 1719)  iohexol (OMNIPAQUE) 300 MG/ML solution 100 mL (100 mLs Intravenous Contrast Given 11/04/21 1558)  droperidol (INAPSINE) 2.5 MG/ML injection 2.5 mg (2.5 mg Intravenous Given 11/04/21 1621)     IMPRESSION / MDM  / ASSESSMENT AND PLAN / ED COURSE  I reviewed the triage vital signs and the nursing notes.                               The patient is on the cardiac monitor to evaluate for evidence of arrhythmia and/or significant heart rate changes.   MDM:  26 year old male with history of type 1 diabetes here with nausea, vomiting, abdominal pain.  Patient ill-appearing but nontoxic on exam.  He is able to answer questions appropriately.  Lab work reviewed and is consistent with  severe, recurrent DKA with bicarb of 12, mild AKI, leukocytosis, and anion gap of 24 with pH 7.2.  Beta hydroxybutyric acid is greater than 8.  Patient will be started on IV fluids, IV insulin, and CT scan obtained secondary to his degree of abdominal pain.  CT shows likely acute pancreatitis.  Patient has a history of the same.  No signs of complication.  No signs of sepsis.  Patient given fluids, insulin, and will admit to medicine.  Mental status remains intact.  Per review of records, patient has history of similar presentations, including recent admission for DKA on 2/18.   MEDICATIONS GIVEN IN ED: Medications  lactated ringers infusion (has no administration in time range)  dextrose 5 % in lactated ringers infusion (has no administration in time range)  dextrose 50 % solution 0-50 mL (has no administration in time range)  insulin regular, human (MYXREDLIN) 100 units/ 100 mL infusion (7 Units/hr Intravenous Rate/Dose Change 11/04/21 1722)  potassium chloride 10 mEq in 100 mL IVPB (10 mEq Intravenous New Bag/Given 11/04/21 1719)  morphine (PF) 4 MG/ML injection 4 mg (has no administration in time range)  0.9 %  sodium chloride infusion (1,000 mLs Intravenous New Bag/Given 11/04/21 1459)  ondansetron (ZOFRAN) injection 4 mg (4 mg Intravenous Given 11/04/21 1500)  lactated ringers bolus 1,000 mL (0 mLs Intravenous Stopped 11/04/21 1719)  iohexol (OMNIPAQUE) 300 MG/ML solution 100 mL (100 mLs Intravenous Contrast Given 11/04/21  1558)  droperidol (INAPSINE) 2.5 MG/ML injection 2.5 mg (2.5 mg Intravenous Given 11/04/21 1621)     Consults:     EMR reviewed  Multiple prior ED visits, ED to hospital admission 2/18 for DKA, reviewed H&P and DC summary     FINAL CLINICAL IMPRESSION(S) / ED DIAGNOSES   Final diagnoses:  Type 1 diabetes mellitus with ketoacidosis without coma (HCC)  Recurrent pancreatitis     Rx / DC Orders   ED Discharge Orders     None        Note:  This document was prepared using Dragon voice recognition software and may include unintentional dictation errors.   Shaune Pollack, MD 11/04/21 [phone]

## 2021-11-04 NOTE — Hospital Course (Signed)
Mr. Roberth Kumari is a 26 year old male with history of hypothyroid, insulin-dependent diabetes mellitus, chronic pancreatitis, presents emergency department for chief concerns of nausea and vomiting.  Initial vitals in the emergency department show temperature of 98, respiration rate of 30 and improved to 20, heart rate of 105, blood pressure 140/93, SPO2 of 99% on room air.  VBG was 7.2 12/05/1962.  Serum sodium 136, potassium 4.6, chloride 100, bicarb 12, nonfasting blood glucose 302, BUN of 22, serum creatinine of 1.55, anion gap elevated at 24.  Lipase was elevated at 254.  WBC elevated at 14.6, hemoglobin 15.5, platelets of 482.  Beta hydroxybutyrate elevated at greater than 8.0.  CT of abdomen and pelvis read as concerning for acute pancreatitis.  ED treatment: Droperidol 2.5 mg IV, morphine 4 mg IV, ondansetron 4 mg IV.  Patient received sodium chloride 1 L bolus, LR 1 L bolus.  Patient was started on LR infusion at 125 mL/h.  Patient received potassium chloride 10 mill equivalent x2.  Patient was started on insulin GTT.

## 2021-11-04 NOTE — ED Notes (Signed)
Lab called stating that the BMP and Betahydroxy were hemolyzed. Will redraw.

## 2021-11-04 NOTE — Assessment & Plan Note (Signed)
-   Serum creatinine is 1.55, compared to 0.96 on 10/26/2021 - Presumed secondary to DKA - Treat DKA

## 2021-11-04 NOTE — Assessment & Plan Note (Addendum)
-   Acquired - Resumed home levothyroxine 150 mcg daily before breakfast

## 2021-11-04 NOTE — ED Notes (Signed)
One set of cultures and lactic sent with other labs.

## 2021-11-05 ENCOUNTER — Inpatient Hospital Stay: Payer: BC Managed Care – PPO

## 2021-11-05 LAB — HIV ANTIBODY (ROUTINE TESTING W REFLEX): HIV Screen 4th Generation wRfx: NONREACTIVE

## 2021-11-05 LAB — BASIC METABOLIC PANEL
Anion gap: 11 (ref 5–15)
Anion gap: 11 (ref 5–15)
Anion gap: 8 (ref 5–15)
BUN: 15 mg/dL (ref 6–20)
BUN: 12 mg/dL (ref 6–20)
BUN: 12 mg/dL (ref 6–20)
CO2: 20 mmol/L — ABNORMAL LOW (ref 22–32)
CO2: 22 mmol/L (ref 22–32)
CO2: 24 mmol/L (ref 22–32)
Calcium: 8.9 mg/dL (ref 8.9–10.3)
Calcium: 8.7 mg/dL — ABNORMAL LOW (ref 8.9–10.3)
Calcium: 8.8 mg/dL — ABNORMAL LOW (ref 8.9–10.3)
Chloride: 108 mmol/L (ref 98–111)
Chloride: 106 mmol/L (ref 98–111)
Chloride: 108 mmol/L (ref 98–111)
Creatinine, Ser: 1.19 mg/dL (ref 0.61–1.24)
Creatinine, Ser: 1.21 mg/dL (ref 0.61–1.24)
Creatinine, Ser: 1.12 mg/dL (ref 0.61–1.24)
GFR, Estimated: >60 mL/min (ref >60)
GFR, Estimated: >60 mL/min (ref >60)
GFR, Estimated: >60 mL/min (ref >60)
Glucose, Bld: 168 mg/dL — ABNORMAL HIGH (ref 70–99)
Glucose, Bld: 210 mg/dL — ABNORMAL HIGH (ref 70–99)
Glucose, Bld: 194 mg/dL — ABNORMAL HIGH (ref 70–99)
Potassium: 3.9 mmol/L (ref 3.5–5.1)
Potassium: 3.8 mmol/L (ref 3.5–5.1)
Potassium: 3.6 mmol/L (ref 3.5–5.1)
Sodium: 139 mmol/L (ref 135–145)
Sodium: 139 mmol/L (ref 135–145)
Sodium: 140 mmol/L (ref 135–145)

## 2021-11-05 LAB — LIPASE, BLOOD: Lipase: 518 U/L — ABNORMAL HIGH (ref 11–51)

## 2021-11-05 LAB — GLUCOSE, CAPILLARY
Glucose-Capillary: 134 mg/dL — ABNORMAL HIGH (ref 70–99)
Glucose-Capillary: 163 mg/dL — ABNORMAL HIGH (ref 70–99)
Glucose-Capillary: 166 mg/dL — ABNORMAL HIGH (ref 70–99)
Glucose-Capillary: 168 mg/dL — ABNORMAL HIGH (ref 70–99)
Glucose-Capillary: 174 mg/dL — ABNORMAL HIGH (ref 70–99)
Glucose-Capillary: 177 mg/dL — ABNORMAL HIGH (ref 70–99)
Glucose-Capillary: 177 mg/dL — ABNORMAL HIGH (ref 70–99)
Glucose-Capillary: 177 mg/dL — ABNORMAL HIGH (ref 70–99)
Glucose-Capillary: 178 mg/dL — ABNORMAL HIGH (ref 70–99)
Glucose-Capillary: 180 mg/dL — ABNORMAL HIGH (ref 70–99)
Glucose-Capillary: 181 mg/dL — ABNORMAL HIGH (ref 70–99)
Glucose-Capillary: 195 mg/dL — ABNORMAL HIGH (ref 70–99)
Glucose-Capillary: 199 mg/dL — ABNORMAL HIGH (ref 70–99)
Glucose-Capillary: 202 mg/dL — ABNORMAL HIGH (ref 70–99)
Glucose-Capillary: 203 mg/dL — ABNORMAL HIGH (ref 70–99)
Glucose-Capillary: 237 mg/dL — ABNORMAL HIGH (ref 70–99)
Glucose-Capillary: 265 mg/dL — ABNORMAL HIGH (ref 70–99)

## 2021-11-05 LAB — BETA-HYDROXYBUTYRIC ACID
Beta-Hydroxybutyric Acid: 3 mmol/L — ABNORMAL HIGH (ref 0.05–0.27)
Beta-Hydroxybutyric Acid: 0.43 mmol/L — ABNORMAL HIGH (ref 0.05–0.27)

## 2021-11-05 LAB — TRIGLYCERIDES: Triglycerides: 212 mg/dL — ABNORMAL HIGH (ref <150)

## 2021-11-05 MED ORDER — ONDANSETRON HCL 4 MG/2ML IJ SOLN
4.0000 mg | Freq: Four times a day (QID) | INTRAMUSCULAR | Status: DC | PRN
  Administered 2021-11-05: 4 mg via INTRAVENOUS
  Filled 2021-11-05: qty 2

## 2021-11-05 MED ORDER — INSULIN ASPART 100 UNIT/ML IJ SOLN: 0.0000 [IU] | Freq: Three times a day (TID) | INTRAMUSCULAR | Status: DC

## 2021-11-05 MED ORDER — INSULIN ASPART 100 UNIT/ML IJ SOLN
0.0000 [IU] | Freq: Every day | INTRAMUSCULAR | Status: DC
  Administered 2021-11-05 – 2021-11-07 (×2): 3 [IU] via SUBCUTANEOUS
  Filled 2021-11-05 (×2): qty 1

## 2021-11-05 MED ORDER — ADULT MULTIVITAMIN W/MINERALS CH
1.0000 | ORAL_TABLET | Freq: Every day | ORAL | Status: DC
Start: 2021-11-05 — End: 2021-11-08
  Administered 2021-11-05 – 2021-11-08 (×4): 1 via ORAL
  Filled 2021-11-05 (×4): qty 1

## 2021-11-05 MED ORDER — INSULIN ASPART 100 UNIT/ML IJ SOLN
2.0000 [IU] | Freq: Three times a day (TID) | INTRAMUSCULAR | Status: DC
  Administered 2021-11-05: 2 [IU] via SUBCUTANEOUS
  Filled 2021-11-05: qty 1

## 2021-11-05 MED ORDER — INSULIN GLARGINE-YFGN 100 UNIT/ML ~~LOC~~ SOLN
24.0000 [IU] | Freq: Every day | SUBCUTANEOUS | Status: DC
  Administered 2021-11-05 – 2021-11-08 (×3): 24 [IU] via SUBCUTANEOUS
  Filled 2021-11-05 (×4): qty 0.24

## 2021-11-05 MED ORDER — INSULIN ASPART 100 UNIT/ML IJ SOLN: 2.0000 [IU] | Freq: Three times a day (TID) | INTRAMUSCULAR | Status: DC

## 2021-11-05 MED ORDER — INSULIN ASPART 100 UNIT/ML IJ SOLN
0.0000 [IU] | Freq: Three times a day (TID) | INTRAMUSCULAR | Status: DC
  Administered 2021-11-05: 3 [IU] via SUBCUTANEOUS
  Administered 2021-11-06: 5 [IU] via SUBCUTANEOUS
  Administered 2021-11-06 (×2): 2 [IU] via SUBCUTANEOUS
  Administered 2021-11-07: 5 [IU] via SUBCUTANEOUS
  Administered 2021-11-07: 2 [IU] via SUBCUTANEOUS
  Administered 2021-11-07: 9 [IU] via SUBCUTANEOUS
  Administered 2021-11-08: 7 [IU] via SUBCUTANEOUS
  Filled 2021-11-05 (×7): qty 1

## 2021-11-05 MED ORDER — SODIUM CHLORIDE 0.9 % IV SOLN
INTRAVENOUS | Status: DC
Start: 2021-11-05 — End: 2021-11-07

## 2021-11-05 MED ORDER — OXYCODONE HCL 5 MG PO TABS
5.0000 mg | ORAL_TABLET | ORAL | Status: DC | PRN
  Administered 2021-11-05 – 2021-11-06 (×5): 5 mg via ORAL
  Filled 2021-11-05 (×5): qty 1

## 2021-11-05 NOTE — Progress Notes (Addendum)
Pt for transfer to 1A. Day RN unable to receive report for now, requested to call back after 1930 for night shift. Pt updated with plan of care. ?

## 2021-11-05 NOTE — Progress Notes (Addendum)
Inpatient Diabetes Program Recommendations ? ?AACE/ADA: New Consensus Statement on Inpatient Glycemic Control  ? ?Target Ranges:  Prepandial:   less than 140 mg/dL ?     Peak postprandial:   less than 180 mg/dL (1-2 hours) ?     Critically ill patients:  140 - 180 mg/dL  ? ? Latest Reference Range & Units 11/05/21 00:59 11/05/21 01:59 11/05/21 03:00 11/05/21 03:59 11/05/21 04:57 11/05/21 05:59  ? ? Latest Reference Range & Units 11/04/21 14:39  ?CO2 22 - 32 mmol/L 12 (L)  ?Glucose 70 - 99 mg/dL 948 (H)  ?Anion gap 5 - 15  24 (H)  ? ? Latest Reference Range & Units 10/29/20 03:03 10/26/21 08:39  ?Hemoglobin A1C 4.8 - 5.6 % 11.7 (H) 12.1 (H)  ? ?Review of Glycemic Control ? ?Diabetes history: DM1 (makes NO insulin; requires basal, correction, and carbohydrate coverage insulin) ?Outpatient Diabetes medications: Lantus 30 units daily, Humalog 1 unit for every 8 grams of carbs, Humalog 1 unit for every 20 mg/dl above target of 546 mg/dl ?Current orders for Inpatient glycemic control: IV insulin ? ?Inpatient Diabetes Program Recommendations:   ? ?Insulin: IV insulin should be continued until acidosis has completely resolved. Once acidosis resolved and provider is ready to transition from IV to SQ insulin, please consider ordering Semglee 24 units Q24H, CBGs Q4H, Novolog 0-9 units Q4H, and Novolog 4 units TID with meals for meal coverage if patient eats at least 50% of meals. ? ?Outpatient: Please provide Rx for box of Levemir Flexpens [phone]) and box of Humalog Flexpens [phone]). ? ?NOTE: Per chart, patient has DM1 and admitted with DKA on 11/04/21 and acute pancreatitis. Initial glucose 302 mg/dl and IV insulin was ordered which is still ordered at this time. In reviewing chart, noted patient was recently inpatient 10/25/21-10/26/21 at [facility] Per discharge summary on 10/26/21, "He has been out of his medications we will give prescription for Humalog and Lantus as requested by patient. I have told him to use  Novolin 70/30 in the meantime, 15 units subcu twice daily till he gets his prescription filled. He can follow-up with PCP in 1 week. [facility], will call to make an appointment" ? ?Addendum 11/05/21@13 :30-Spoke with patient about diabetes and home regimen for diabetes control. Patient lying in bed with eyes closed, easily awoke to name but kept eyes closed during most of the conversation. Patient reports he does have [facility] but no PCP. Patient reports he is currently taking Lantus 30 units daily, Humalog 1 unit for every 8 grams of carbs, Humalog 1 unit for every 20 mg/dl above target of 182 mg/dl for DM control. Patient states that his insurance no longer covers Lantus so he is having an issue trying to get his Lantus. Patient states that his insurance still covers Humalog. Inquired about 70/30 and patient states that he has used 70/30 insulin but it does not work as well for him as Lantus and Humalog. Informed patient that I would check to see which basal insulin is preferred with insurance. Patient states this his copay for insulins are affordable. Stressed to patient that he will definitely need to get established with a PCP so he can continue to get Rx for insulins consistently. Patient states he lives in [place]. Asked if he recalls from last discharge being asked to call [facility] to get an appointment to establish care (per hospital discharge on 10/26/21) and patient states he does not recall being  told about [facility] or remember seeing anything on discharge paperwork about it. Discussed glucose and A1C goals. Discussed importance of checking CBGs and maintaining good CBG control to prevent long-term and short-term complications. Stressed to the patient the importance of improving glycemic control to prevent further complications from uncontrolled diabetes. Informed patient that it would be requested that he be provided with Rx for preferred  basal insulin and Humalog insulin pens at discharge.  Patient verbalized understanding of information discussed and reports no further questions at this time related to diabetes. Checked insurance basal insulin coverage and Levemir is preferred basal insulin. ? ?Thanks, ?Orlando Penner, RN, MSN, CDE ?Diabetes Coordinator ?Inpatient Diabetes Program ?[phone] (Team Pager from 8am to 5pm) ? ? ? ?

## 2021-11-05 NOTE — Progress Notes (Signed)
Initial Nutrition Assessment ? ?DOCUMENTATION CODES:  ? ?Not applicable ? ?INTERVENTION:  ? ?-MVI with minerals daily ?-RD will follow for diet advancement and add supplements as appropriate ? ?NUTRITION DIAGNOSIS:  ? ?Increased nutrient needs related to acute illness as evidenced by estimated needs. ? ?GOAL:  ? ?Patient will meet greater than or equal to 90% of their needs ? ?MONITOR:  ? ?PO intake, Supplement acceptance, Diet advancement, Labs, Weight trends, Skin, I & O's ? ?REASON FOR ASSESSMENT:  ? ?Malnutrition Screening Tool ?  ? ?ASSESSMENT:  ? ?[name] is a 26 year old male with history of hypothyroid, insulin-dependent diabetes mellitus, chronic pancreatitis, presents emergency department for chief concerns of nausea and vomiting. ? ?Pt admitted with DKA.  ? ?Reviewed I/O's: +2.9 L x 24 hours  ? ?UOP: 900 ml x 24 hours ? ?Pt out of room at time of visit. No family at bedside.  ? ?Pt NPO and was advanced to clear liquid diet after RD visit.   ? ?Reviewed wt hx; pt has experienced a 14.3% wt loss over the past year. This is not significant for time frame. Suspect uncontrolled DM is contributing to weight loss.  ? ?Medications reviewed and include dextrose 5% in lactated ringers infusions @ 125 ml/hr and IV insulin drip.  ? ?Lab Results  ?Component Value Date  ? HGBA1C 12.1 (H) 10/26/2021  ?PTA DM medications are 0-9 units insulin lisro TID with meals and 35 units insulin glargine daily. Per DM coordinator note, pt with insurance but does not have a PCP and has been using 70/30 insulin.  ? ?Labs reviewed: CBGS: 168-199 (inpatient orders for glycemic control are 24 units insulin glargine-yfgn daily).   ? ?Diet Order:   ?Diet Order   ? ?       ?  Diet clear liquid Room service appropriate? Yes; Fluid consistency: Thin  Diet effective now       ?  ? ?  ?  ? ?  ? ? ?EDUCATION NEEDS:  ? ?No education needs have been identified at this time ? ?Skin:  Skin Assessment: Reviewed RN Assessment ? ?Last BM:   Unknown ? ?Height:  ? ?Ht Readings from Last 1 Encounters:  ?11/04/21 5\' 6"  (1.676 m)  ? ? ?Weight:  ? ?Wt Readings from Last 1 Encounters:  ?11/04/21 66.8 kg  ? ? ?Ideal Body Weight:  64.5 kg ? ?BMI:  Body mass index is 23.77 kg/m?. ? ?Estimated Nutritional Needs:  ? ?Kcal:  1800-2000 ? ?Protein:  100-115 grams ? ?Fluid:  > 1.8 L ? ? ? ?11/06/21, RD, LDN, CDCES ?Registered Dietitian II ?Certified Diabetes Care and Education Specialist ?Please refer to [facility] for RD and/or RD on-call/weekend/after hours pager  ?

## 2021-11-05 NOTE — Assessment & Plan Note (Addendum)
Patient's lipase down to 96 as of yesterday from a peak of over 500.  Patient tolerated advance diet from yesterday and stable for disposition. ?

## 2021-11-05 NOTE — Assessment & Plan Note (Signed)
Hypothyroidism unspecified.  Continue levothyroxine ?

## 2021-11-05 NOTE — Assessment & Plan Note (Addendum)
Patient on Semglee insulin 24 units while here.  His insurance covers Levemir insulin so that was prescribed.  The patient will go back on his Humalog sliding scale prior to meals ?

## 2021-11-05 NOTE — Progress Notes (Signed)
?  Progress Note ? ? ?Patient: David Ramsey LEX:[id number] DOB: 1996-05-09 DOA: 11/04/2021     1 ?DOS: the patient was seen and examined on 11/05/2021 ?  ? ?Assessment and Plan: ?* DKA (diabetic ketoacidosis) (HCC)- (present on admission) ?DKA with type 1 diabetes mellitus.  This morning was not feeling well a lot of nausea and was not ready to start diet at this time.  Patient will be converted off insulin drip shortly and started on Semglee insulin 24 units and then will adjust sliding scale after that. ? ?Acute pancreatitis- (present on admission) ?Today's lipase in the 500s.  As needed pain medications.  Patient states he does not drink alcohol.  The patient's wife states that he drinks occasionally but has not drinking any alcohol in a couple months.  Ultrasound of the gallbladder today ordered by me is negative.  Only new medication was oxycodone only a few pills..  Continue to monitor closely.  Triglycerides not high enough to cause pancreatitis.  Continue IV fluids and switch over to normal saline once off insulin drip.  Check IgG4, ANA and CPK in the morning. ? ?AKI (acute kidney injury) (HCC)- (present on admission) ?Creatinine 1.55 on presentation and down to 1.12. ? ?Hypothyroid ?Hypothyroidism unspecified.  Continue levothyroxine ? ? ? ? ? ? ? ?Subjective: Patient seen this morning and not feeling well at all.  Had quite a bit of nausea and some abdominal pain.  Admitted with DKA and also found to have pancreatitis ? ?Physical Exam: ?Vitals:  ? 11/05/21 1000 11/05/21 1100 11/05/21 1200 11/05/21 1300  ?BP: 122/73 139/86 126/82 130/82  ?Pulse: (!) 110 (!) 102 (!) 112 (!) 107  ?Resp: 18 14 15 16   ?Temp:   99 ?F (37.2 ?C)   ?TempSrc:   Oral   ?SpO2: 99% 97% 97% 98%  ?Weight:      ?Height:      ? ?Physical Exam ?HENT:  ?   Head: Normocephalic.  ?   Mouth/Throat:  ?   Pharynx: No oropharyngeal exudate.  ?Eyes:  ?   General: Lids are normal.  ?   Conjunctiva/sclera: Conjunctivae normal.  ?Cardiovascular:  ?    Rate and Rhythm: Normal rate and regular rhythm.  ?   Heart sounds: Normal heart sounds, S1 normal and S2 normal.  ?Pulmonary:  ?   Breath sounds: Normal breath sounds. No decreased breath sounds, wheezing, rhonchi or rales.  ?Abdominal:  ?   Palpations: Abdomen is soft.  ?   Tenderness: There is abdominal tenderness in the epigastric area.  ?Musculoskeletal:  ?   Right lower leg: No swelling.  ?   Left lower leg: No swelling.  ?Skin: ?   General: Skin is warm.  ?   Findings: No rash.  ?Neurological:  ?   Mental Status: He is alert and oriented to person, place, and time.  ?  ? ?Data Reviewed: ?CT scan of the abdomen pelvis showing peripancreatic edema around the pancreatic body consistent with acute pancreatitis.  Also seen calcifications at the pancreatic head and uncinate process are compatible with prior pancreatic inflammation.  Today's creatinine 1.12, lipase 518, last beta hydroxybutyrate acid 0.43 ? ?Family Communication: Updated patient's wife on the phone ? ?Disposition: ?Status is: Inpatient ?Remains inpatient appropriate because: Patient being treated for pancreatitis ? ?Planned Discharge Destination: Home ? ? ?Author: ? , MD ?11/05/2021 2:22 PM ? ?For on call review [email or web address].  ? ?

## 2021-11-05 NOTE — Assessment & Plan Note (Addendum)
Creatinine 1.55 on presentation and down to 0.83. ?

## 2021-11-05 NOTE — Plan of Care (Signed)
?  Problem: Education: ?Goal: Knowledge of General Education information will improve ?Description: Including pain rating scale, medication(s)/side effects and non-pharmacologic comfort measures ?Outcome: Progressing ?  ?Problem: Nutritional: ?Goal: Maintenance of adequate nutrition will improve ?Outcome: Progressing ?  ?Problem: Respiratory: ?Goal: Will regain and/or maintain adequate ventilation ?Outcome: Progressing ?  ?Problem: Urinary Elimination: ?Goal: Ability to achieve and maintain adequate renal perfusion and functioning will improve ?Outcome: Progressing ?  ?

## 2021-11-06 ENCOUNTER — Inpatient Hospital Stay: Payer: BC Managed Care – PPO

## 2021-11-06 ENCOUNTER — Encounter: Payer: Self-pay | Admitting: Internal Medicine

## 2021-11-06 DIAGNOSIS — R0781 Pleurodynia: Secondary | ICD-10-CM

## 2021-11-06 DIAGNOSIS — E1140 Type 2 diabetes mellitus with diabetic neuropathy, unspecified: Secondary | ICD-10-CM

## 2021-11-06 DIAGNOSIS — E876 Hypokalemia: Secondary | ICD-10-CM

## 2021-11-06 DIAGNOSIS — E1042 Type 1 diabetes mellitus with diabetic polyneuropathy: Secondary | ICD-10-CM

## 2021-11-06 LAB — BASIC METABOLIC PANEL
Anion gap: 5 (ref 5–15)
BUN: 6 mg/dL (ref 6–20)
CO2: 25 mmol/L (ref 22–32)
Calcium: 8.1 mg/dL — ABNORMAL LOW (ref 8.9–10.3)
Chloride: 110 mmol/L (ref 98–111)
Creatinine, Ser: 0.81 mg/dL (ref 0.61–1.24)
GFR, Estimated: >60 mL/min (ref >60)
Glucose, Bld: 109 mg/dL — ABNORMAL HIGH (ref 70–99)
Potassium: 2.9 mmol/L — ABNORMAL LOW (ref 3.5–5.1)
Sodium: 140 mmol/L (ref 135–145)

## 2021-11-06 LAB — CBC
HCT: 34 % — ABNORMAL LOW (ref 39.0–52.0)
Hemoglobin: 11.5 g/dL — ABNORMAL LOW (ref 13.0–17.0)
MCH: 30.1 pg (ref 26.0–34.0)
MCHC: 33.8 g/dL (ref 30.0–36.0)
MCV: 89 fL (ref 80.0–100.0)
Platelets: 305 10*3/uL (ref 150–400)
RBC: 3.82 MIL/uL — ABNORMAL LOW (ref 4.22–5.81)
RDW: 12.8 % (ref 11.5–15.5)
WBC: 7.1 10*3/uL (ref 4.0–10.5)
nRBC: 0 % (ref 0.0–0.2)

## 2021-11-06 LAB — MAGNESIUM: Magnesium: 1.9 mg/dL (ref 1.7–2.4)

## 2021-11-06 LAB — GLUCOSE, CAPILLARY
Glucose-Capillary: 185 mg/dL — ABNORMAL HIGH (ref 70–99)
Glucose-Capillary: 256 mg/dL — ABNORMAL HIGH (ref 70–99)
Glucose-Capillary: 166 mg/dL — ABNORMAL HIGH (ref 70–99)
Glucose-Capillary: 111 mg/dL — ABNORMAL HIGH (ref 70–99)

## 2021-11-06 LAB — CK: Total CK: 44 U/L — ABNORMAL LOW (ref 49–397)

## 2021-11-06 LAB — LIPASE, BLOOD: Lipase: 302 U/L — ABNORMAL HIGH (ref 11–51)

## 2021-11-06 LAB — D-DIMER, QUANTITATIVE: D-Dimer, Quant: 1.82 ug/mL-FEU — ABNORMAL HIGH (ref 0.00–0.50)

## 2021-11-06 MED ORDER — BUDESONIDE 0.5 MG/2ML IN SUSP
0.5000 mg | Freq: Two times a day (BID) | RESPIRATORY_TRACT | Status: DC
  Administered 2021-11-06 – 2021-11-08 (×5): 0.5 mg via RESPIRATORY_TRACT
  Filled 2021-11-06 (×5): qty 2

## 2021-11-06 MED ORDER — KETOROLAC TROMETHAMINE 15 MG/ML IJ SOLN
15.0000 mg | Freq: Three times a day (TID) | INTRAMUSCULAR | Status: DC | PRN
  Administered 2021-11-06 – 2021-11-08 (×5): 15 mg via INTRAVENOUS
  Filled 2021-11-06 (×5): qty 1

## 2021-11-06 MED ORDER — POTASSIUM CHLORIDE 10 MEQ/100ML IV SOLN
10.0000 meq | INTRAVENOUS | Status: AC
  Administered 2021-11-06 (×2): 10 meq via INTRAVENOUS
  Filled 2021-11-06 (×2): qty 100

## 2021-11-06 MED ORDER — MORPHINE SULFATE (PF) 2 MG/ML IV SOLN
2.0000 mg | Freq: Once | INTRAVENOUS | Status: AC
  Administered 2021-11-06: 2 mg via INTRAVENOUS
  Filled 2021-11-06: qty 1

## 2021-11-06 MED ORDER — PIPERACILLIN-TAZOBACTAM 3.375 G IVPB
3.3750 g | Freq: Three times a day (TID) | INTRAVENOUS | Status: DC
  Administered 2021-11-06 – 2021-11-08 (×5): 3.375 g via INTRAVENOUS
  Filled 2021-11-06 (×6): qty 50

## 2021-11-06 MED ORDER — IPRATROPIUM-ALBUTEROL 0.5-2.5 (3) MG/3ML IN SOLN
3.0000 mL | Freq: Four times a day (QID) | RESPIRATORY_TRACT | Status: DC
Start: 2021-11-06 — End: 2021-11-08
  Administered 2021-11-06 – 2021-11-08 (×7): 3 mL via RESPIRATORY_TRACT
  Filled 2021-11-06 (×7): qty 3

## 2021-11-06 MED ORDER — POTASSIUM CHLORIDE CRYS ER 20 MEQ PO TBCR
40.0000 meq | EXTENDED_RELEASE_TABLET | Freq: Once | ORAL | Status: AC
  Administered 2021-11-06: 40 meq via ORAL
  Filled 2021-11-06: qty 2

## 2021-11-06 MED ORDER — INSULIN ASPART 100 UNIT/ML IJ SOLN
4.0000 [IU] | Freq: Three times a day (TID) | INTRAMUSCULAR | Status: DC
  Administered 2021-11-06 (×3): 4 [IU] via SUBCUTANEOUS
  Filled 2021-11-06 (×3): qty 1

## 2021-11-06 MED ORDER — IOHEXOL 350 MG/ML SOLN
75.0000 mL | Freq: Once | INTRAVENOUS | Status: AC | PRN
  Administered 2021-11-06: 75 mL via INTRAVENOUS

## 2021-11-06 MED ORDER — IPRATROPIUM BROMIDE 0.06 % NA SOLN
2.0000 | Freq: Two times a day (BID) | NASAL | Status: DC
  Administered 2021-11-06 – 2021-11-07 (×4): 2 via NASAL
  Filled 2021-11-06 (×2): qty 15

## 2021-11-06 MED ORDER — OXYCODONE HCL 5 MG PO TABS
5.0000 mg | ORAL_TABLET | ORAL | Status: DC | PRN
  Administered 2021-11-06 – 2021-11-08 (×9): 5 mg via ORAL
  Filled 2021-11-06 (×9): qty 1

## 2021-11-06 NOTE — Progress Notes (Signed)
Inpatient Diabetes Program Recommendations ? ?AACE/ADA: New Consensus Statement on Inpatient Glycemic Control  ? ?Target Ranges:  Prepandial:   less than 140 mg/dL ?     Peak postprandial:   less than 180 mg/dL (1-2 hours) ?     Critically ill patients:  140 - 180 mg/dL  ? ? Latest Reference Range & Units 11/06/21 04:29  ?Glucose 70 - 99 mg/dL 638 (H)  ? ? Latest Reference Range & Units 11/05/21 09:21 11/05/21 10:21 11/05/21 11:43 11/05/21 12:40 11/05/21 13:49 11/05/21 14:51 11/05/21 16:56 11/05/21 20:49  ?Glucose-Capillary 70 - 99 mg/dL 756 (H) 433 (H) 295 (H) 168 (H) 180 (H) 166 (H) 237 (H) 265 (H)  ? ?Review of Glycemic Control ? ?Diabetes history: DM1 (makes NO insulin; requires basal, correction, and carbohydrate coverage insulin) ?Outpatient Diabetes medications: Lantus 30 units daily, Humalog 1 unit for every 8 grams of carbs, Humalog 1 unit for every 20 mg/dl above target of 188 mg/dl ?Current orders for Inpatient glycemic control: Semglee 24 units daily, Novolog 0-9 units TID with meals, Novolog 0-5 units QHS, Novolog 2 units TID with meals ? ?Inpatient Diabetes Program Recommendations:   ? ?Insulin: Please consider increasing meal coverage insulin to Novolog 4 units TID with meals if patient is eating at least 50% of meals. ?  ?Outpatient: Patient needs to get established with a PCP so he can continue to get Rx for insulins consistently.  At time of discharge, please provide Rx for box of Levemir Flexpens [phone]) and box of Humalog Flexpens [phone]). ? ?NOTE: Patient has been transitioned to SQ insulin and received Semglee 24 units at 15:17 on 11/05/21. Lab glucose at 4:29 am today was 109 mg/dl.  ? ?Thanks, ?Orlando Penner, RN, MSN, CDE ?Diabetes Coordinator ?Inpatient Diabetes Program ?[phone] (Team Pager from 8am to 5pm) ? ? ? ?

## 2021-11-06 NOTE — Assessment & Plan Note (Deleted)
We will start nebulizer treatments with slight wheeze at the bases.  We will send off a D-dimer in the a.m. ?

## 2021-11-06 NOTE — Consult Note (Signed)
Pharmacy Antibiotic Note ? ?David Ramsey is a 26 y.o. male admitted on 11/04/2021 with HCAP.  Pharmacy has been consulted for zosyn dosing. ? ?Plan: ?Zosyn 3.375 gm IV q8h (4 hour infusion) ?Monitor clinical picture and renal function ?F/U C&S, abx deescalation / LOT ? ? ?Height: 5\' 6"  (167.6 cm) ?Weight: 66.8 kg (147 lb 4.3 oz) ?IBW/kg (Calculated) : 63.8 ? ?Temp (24hrs), Avg:98.2 ?F (36.8 ?C), Min:97.5 ?F (36.4 ?C), Max:98.9 ?F (37.2 ?C) ? ?Recent Labs  ?Lab 11/04/21 ?1439 11/04/21 ?1958 11/04/21 ?2231 11/05/21 ?0216 11/05/21 ?0719 11/05/21 ?1016 11/06/21 ?0429  ?WBC 14.6*  --   --   --   --   --  7.1  ?CREATININE 1.55*   < > 1.39* 1.19 1.21 1.12 0.81  ? < > = values in this interval not displayed.  ?  ?Estimated Creatinine Clearance: 126.9 mL/min (by C-G formula based on SCr of 0.81 mg/dL).   ? ?Allergies  ?Allergen Reactions  ? Tape Rash  ?  Blisters- can use kerlix tape  ? ? ?Antimicrobials this admission: ?3/2 Zosyn >>  ? ? ?Dose adjustments this admission: ?N/A ? ?Microbiology results: ?3/2 MRSA PCR: negative ? ?Thank you for allowing pharmacy to be a part of this patient?s care. ? ?01/06/22, PharmD  ?11/06/2021 8:11 PM ? ?

## 2021-11-06 NOTE — Assessment & Plan Note (Signed)
Continue gabapentin.

## 2021-11-06 NOTE — Assessment & Plan Note (Addendum)
Potassium replaced into the normal range ?

## 2021-11-06 NOTE — Progress Notes (Signed)
Patient complaint of increasing rib, abdominal, and chest pain. Dr Renae Gloss notified. Morphine X1 ordered and given, D-dimer to be collected STAT. VSS. Will continue to monitor closely. ?

## 2021-11-06 NOTE — Progress Notes (Signed)
?  Progress Note ? ? ?Patient: David Ramsey YBO:[id number] DOB: 07/17/1996 DOA: 11/04/2021     2 ?DOS: the patient was seen and examined on 11/06/2021 ?  ? ? ?Assessment and Plan: ?* Acute pancreatitis ?Today's lipase is down to 302.  Was in the 500s yesterday.  Patient had worsening pain with having full liquid diet today and wants to go back to clear liquid diet.  Continue IV fluids.  As needed pain and nausea medications.  Advise no further alcohol. ? ?DKA (diabetic ketoacidosis) (HCC) ?Patient on Semglee insulin 24 units.  4 units of NovoLog 3 times daily with meals plus sliding scale.  Patient was on insulin drip initially. ? ?AKI (acute kidney injury) (HCC) ?Creatinine 1.55 on presentation and down to 0.81. ? ?Hypothyroid ?Hypothyroidism unspecified.  Continue levothyroxine ? ?Pleuritic chest pain ?We will start nebulizer treatments with slight wheeze at the bases.  We will send off a D-dimer in the a.m. ? ?Hypokalemia ?Potassium 2.9.  Replace potassium orally and IV.  Magnesium normal range. ? ?Diabetic neuropathy (HCC) ?Continue gabapentin ? ? ? ? ? ? ? ?Subjective: Patient still not feeling well.  Still has some nausea.  Still has some abdominal pain.  Had some diarrhea after the liquid today.  Had some worsening pain after the full liquid diet this morning.  Wanted to go back to clear liquids.  Having some shortness of breath and some pain in the lower chest area. ? ?Physical Exam: ?Vitals:  ? 11/06/21 0811 11/06/21 1135 11/06/21 1357 11/06/21 1525  ?BP: 103/60 112/81  124/90  ?Pulse: 79 85  100  ?Resp: 16 18  18   ?Temp: 98.6 ?F (37 ?C) 98.9 ?F (37.2 ?C)  98.4 ?F (36.9 ?C)  ?TempSrc:      ?SpO2: 95% 100% 100% 100%  ?Weight:      ?Height:      ? ?Physical Exam ?HENT:  ?   Head: Normocephalic.  ?   Mouth/Throat:  ?   Pharynx: No oropharyngeal exudate.  ?Eyes:  ?   General: Lids are normal.  ?   Conjunctiva/sclera: Conjunctivae normal.  ?Cardiovascular:  ?   Rate and Rhythm: Normal rate and regular rhythm.  ?    Heart sounds: Normal heart sounds, S1 normal and S2 normal.  ?Pulmonary:  ?   Breath sounds: Examination of the right-lower field reveals decreased breath sounds and wheezing. Examination of the left-lower field reveals decreased breath sounds and wheezing. Decreased breath sounds and wheezing present. No rhonchi or rales.  ?Abdominal:  ?   Palpations: Abdomen is soft.  ?   Tenderness: There is abdominal tenderness in the epigastric area.  ?Musculoskeletal:  ?   Right lower leg: No swelling.  ?   Left lower leg: No swelling.  ?Skin: ?   General: Skin is warm.  ?   Findings: No rash.  ?Neurological:  ?   Mental Status: He is alert and oriented to person, place, and time.  ?  ? ?Data Reviewed: ?Lipase down to 302.  Potassium 2.9. ? ?Family Communication: Spoke with wife at the bedside ? ?Disposition: ?Status is: Inpatient ?Remains inpatient appropriate because: Still having abdominal pain ? ?Planned Discharge Destination: Home ? ?Author: ? , MD ?11/06/2021 4:21 PM ? ?For on call review [email or web address].  ? ?

## 2021-11-07 ENCOUNTER — Inpatient Hospital Stay: Payer: BC Managed Care – PPO

## 2021-11-07 DIAGNOSIS — J189 Pneumonia, unspecified organism: Secondary | ICD-10-CM

## 2021-11-07 DIAGNOSIS — R7989 Other specified abnormal findings of blood chemistry: Secondary | ICD-10-CM

## 2021-11-07 LAB — BASIC METABOLIC PANEL
Anion gap: 2 — ABNORMAL LOW (ref 5–15)
BUN: <5 mg/dL — ABNORMAL LOW (ref 6–20)
CO2: 27 mmol/L (ref 22–32)
Calcium: 8.1 mg/dL — ABNORMAL LOW (ref 8.9–10.3)
Chloride: 109 mmol/L (ref 98–111)
Creatinine, Ser: 0.83 mg/dL (ref 0.61–1.24)
GFR, Estimated: >60 mL/min (ref >60)
Glucose, Bld: 308 mg/dL — ABNORMAL HIGH (ref 70–99)
Potassium: 5 mmol/L (ref 3.5–5.1)
Sodium: 138 mmol/L (ref 135–145)

## 2021-11-07 LAB — ANA W/REFLEX IF POSITIVE: Anti Nuclear Antibody (ANA): NEGATIVE

## 2021-11-07 LAB — GLUCOSE, CAPILLARY
Glucose-Capillary: 410 mg/dL — ABNORMAL HIGH (ref 70–99)
Glucose-Capillary: 257 mg/dL — ABNORMAL HIGH (ref 70–99)
Glucose-Capillary: 187 mg/dL — ABNORMAL HIGH (ref 70–99)
Glucose-Capillary: 257 mg/dL — ABNORMAL HIGH (ref 70–99)

## 2021-11-07 LAB — LIPASE, BLOOD: Lipase: 96 U/L — ABNORMAL HIGH (ref 11–51)

## 2021-11-07 LAB — IGG 4: IgG, Subclass 4: 23 mg/dL (ref 2–96)

## 2021-11-07 MED ORDER — INSULIN ASPART 100 UNIT/ML IJ SOLN
6.0000 [IU] | Freq: Three times a day (TID) | INTRAMUSCULAR | Status: DC
  Administered 2021-11-07 – 2021-11-08 (×4): 6 [IU] via SUBCUTANEOUS
  Filled 2021-11-07 (×4): qty 1

## 2021-11-07 NOTE — Progress Notes (Signed)
Inpatient Diabetes Program Recommendations ? ?AACE/ADA: New Consensus Statement on Inpatient Glycemic Control (2015) ? ?Target Ranges:  Prepandial:   less than 140 mg/dL ?     Peak postprandial:   less than 180 mg/dL (1-2 hours) ?     Critically ill patients:  140 - 180 mg/dL  ? ? Latest Reference Range & Units 11/06/21 08:11 11/06/21 13:04 11/06/21 17:17 11/06/21 19:51  ?Glucose-Capillary 70 - 99 mg/dL 734 (H) ? ?6 units Novolog ? 256 (H) ? ?9 units Novolog ? 166 (H) ? ?6 units Novolog ? 111 (H)  ? ? Latest Reference Range & Units 11/07/21 08:00  ?Glucose-Capillary 70 - 99 mg/dL 193 (H) ? ?15 units Novolog ? ?24 units Semglee  ? ? ?Diabetes history: DM1 (makes NO insulin; requires basal, correction, and carbohydrate coverage insulin) ? ?Home DM Meds: Lantus 30 units daily ?    Humalog 1 unit for every 8 grams of carbs ?    Humalog 1 unit for every 20 mg/dl above target of 790 mg/dl ? ?Current Orders: Semglee 24 units daily ?  Novolog 0-9 units TID AC + HS ?  Novolog 6 units TID with meals ? ? ? ? ?MD- Per MAR review, pt did NOT get Semglee insulin yesterday.  Per [facility], RN charted: "HOLD--Have to retrieve medication from ICU" ? ?CBG likely severely elevated this AM due to NOT getting Semglee yesterday ? ?Note Novolog Meal Coverage increased to 6 units TID this AM ? ?Patient needs to get established with a PCP so he can continue to get Rx for insulins consistently.  At time of discharge, please provide Rx for box of  ?Levemir Flexpens [phone]) and box of  ?Humalog Flexpens [phone]) ? ? ? ? ?--Will follow patient during hospitalization-- ? ?Ambrose Finland RN, MSN, CDE ?Diabetes Coordinator ?Inpatient Glycemic Control Team ?Team Pager: [phone] (8a-5p) ? ? ? ? ?

## 2021-11-07 NOTE — Progress Notes (Signed)
?Progress Note ? ? ?Patient: David Ramsey [id number] DOB: [birth year] DOA: 11/04/2021     3 ?DOS: the patient was seen and examined on 11/07/2021 ?  ? ? ?Assessment and Plan: ?* Multifocal pneumonia ?Patient with multifocal pneumonia and pleuritic chest pain.  CT scan showing multifocal pneumonia.  I do not want to start steroids since the patient was admitted with DKA.  Zosyn started last evening after CT scan resulted.  As needed Toradol. ? ?Acute pancreatitis ?Patient's lipase down to 96.  Still having some abdominal pain but wants to start diet today.  We will discontinue IV fluids.  Continue to monitor on advance diet. ? ?DKA (diabetic ketoacidosis) ([place]) ?Patient on Semglee insulin 24 units.  6 units of NovoLog 3 times daily with meals plus sliding scale.  Patient was on insulin drip initially.  Sugar was elevated this morning secondary to the patient not getting Semglee insulin yesterday as per diabetes coordinator note.. ? ?AKI (acute kidney injury) ([place]) ?Creatinine 1.55 on presentation and down to 0.83. ? ?Hypothyroid ?Hypothyroidism unspecified.  Continue levothyroxine ? ?Hypokalemia ?Potassium replaced into the normal range. ? ?Elevated d-dimer ?CT scan of the chest negative for PE, ultrasound bilateral lower extremity negative for DVT. ? ?Diabetic neuropathy ([place]) ?Continue gabapentin ? ? ? ? ? ? ? ?Subjective: Patient feeling a little bit better today.  Still having a little bit of chest pain on bilateral ribs.  Diagnosed with pneumonia last night and started on antibiotics.  Abdominal pain doing better and wanting to try advance diet today. ? ?Physical Exam: ?Vitals:  ? 11/06/21 2319 11/07/21 0343 11/07/21 0814 11/07/21 1606  ?BP: 137/88 121/76 127/90 (!) 106/58  ?Pulse: (!) 110 89 74 (!) 102  ?Resp: 18 18 18 18   ?Temp: 98.5 ?F (36.9 ?C) 98 ?F (36.7 ?C) 98 ?F (36.7 ?C) 98.2 ?F (36.8 ?C)  ?TempSrc:   Oral   ?SpO2: 100% 99% 97% 96%  ?Weight:      ?Height:      ? ?Physical Exam ?HENT:  ?   Head:  Normocephalic.  ?   Mouth/Throat:  ?   Pharynx: No oropharyngeal exudate.  ?Eyes:  ?   General: Lids are normal.  ?   Conjunctiva/sclera: Conjunctivae normal.  ?Cardiovascular:  ?   Rate and Rhythm: Normal rate and regular rhythm.  ?   Heart sounds: Normal heart sounds, S1 normal and S2 normal.  ?Pulmonary:  ?   Breath sounds: Examination of the right-lower field reveals decreased breath sounds and rhonchi. Examination of the left-lower field reveals decreased breath sounds and rhonchi. Decreased breath sounds and rhonchi present. No wheezing or rales.  ?Abdominal:  ?   Palpations: Abdomen is soft.  ?   Tenderness: There is abdominal tenderness in the epigastric area.  ?Musculoskeletal:  ?   Right lower leg: No swelling.  ?   Left lower leg: No swelling.  ?Skin: ?   General: Skin is warm.  ?   Findings: No rash.  ?Neurological:  ?   Mental Status: He is alert and oriented to person, place, and time.  ?  ? ?Data Reviewed: ?CT scan of the chest reviewed shows multifocal pneumonia.  No pulmonary embolism.  Lipase down to 96.  D-dimer elevated 1.82. ? ?Family Communication: Updated patient's wife today ? ?Disposition: ?Status is: Inpatient ?Remains inpatient appropriate because: Still having pleuritic chest pain.  Diagnosed with pneumonia last night.  Will advance diet and see how he does.  If does well with diet and  feeling better tomorrow likely disposition. ? ?Planned Discharge Destination: Home ? ?Author: ?Loletha Grayer, MD ?11/07/2021 6:12 PM ? ?For on call review [email or web address].  ? ?

## 2021-11-07 NOTE — Assessment & Plan Note (Signed)
CT scan of the chest negative for PE, ultrasound bilateral lower extremity negative for DVT. ?

## 2021-11-07 NOTE — Assessment & Plan Note (Addendum)
Patient received a few days of Zosyn and will be discharged on a few more days of Augmentin. ?

## 2021-11-07 NOTE — Plan of Care (Signed)
Patient sleeping between care. Aox4. Right sided chest/abdominal pain ongoing. Frequent request for pain medication overnight. No new changes in assessment. Call bell within reach. ? ? ? ? ?Problem: Education: ?Goal: Knowledge of General Education information will improve ?Description: Including pain rating scale, medication(s)/side effects and non-pharmacologic comfort measures ?Outcome: Progressing ?  ?Problem: Health Behavior/Discharge Planning: ?Goal: Ability to manage health-related needs will improve ?Outcome: Progressing ?  ?Problem: Clinical Measurements: ?Goal: Ability to maintain clinical measurements within normal limits will improve ?Outcome: Progressing ?Goal: Will remain free from infection ?Outcome: Progressing ?Goal: Diagnostic test results will improve ?Outcome: Progressing ?  ?Problem: Education: ?Goal: Ability to describe self-care measures that may prevent or decrease complications (Diabetes Survival Skills Education) will improve ?Outcome: Progressing ?Goal: Individualized Educational Video(s) ?Outcome: Progressing ?  ?Problem: Cardiac: ?Goal: Ability to maintain an adequate cardiac output will improve ?Outcome: Progressing ?  ?Problem: Health Behavior/Discharge Planning: ?Goal: Ability to identify and utilize available resources and services will improve ?Outcome: Progressing ?Goal: Ability to manage health-related needs will improve ?Outcome: Progressing ?  ?Problem: Fluid Volume: ?Goal: Ability to achieve a balanced intake and output will improve ?Outcome: Progressing ?  ?Problem: Metabolic: ?Goal: Ability to maintain appropriate glucose levels will improve ?Outcome: Progressing ?  ?Problem: Nutritional: ?Goal: Maintenance of adequate nutrition will improve ?Outcome: Progressing ?Goal: Maintenance of adequate weight for body size and type will improve ?Outcome: Progressing ?  ?Problem: Respiratory: ?Goal: Will regain and/or maintain adequate ventilation ?Outcome: Progressing ?  ?Problem:  Urinary Elimination: ?Goal: Ability to achieve and maintain adequate renal perfusion and functioning will improve ?Outcome: Progressing ?  ?

## 2021-11-08 LAB — GLUCOSE, CAPILLARY: Glucose-Capillary: 341 mg/dL — ABNORMAL HIGH (ref 70–99)

## 2021-11-08 MED ORDER — HUMALOG KWIKPEN 100 UNIT/ML ~~LOC~~ SOPN: 0.0000 [IU] | PEN_INJECTOR | Freq: Three times a day (TID) | SUBCUTANEOUS | 0 refills | Status: DC

## 2021-11-08 MED ORDER — FLUOXETINE HCL 20 MG PO CAPS: 20.0000 mg | ORAL_CAPSULE | Freq: Every day | ORAL | 0 refills | Status: DC

## 2021-11-08 MED ORDER — AMOXICILLIN-POT CLAVULANATE 875-125 MG PO TABS: 1.0000 | ORAL_TABLET | Freq: Two times a day (BID) | ORAL | 0 refills | Status: AC

## 2021-11-08 MED ORDER — INSULIN DETEMIR 100 UNIT/ML FLEXPEN: 24.0000 [IU] | PEN_INJECTOR | Freq: Every day | SUBCUTANEOUS | 0 refills | Status: DC

## 2021-11-08 MED ORDER — GABAPENTIN 300 MG PO CAPS: 300.0000 mg | ORAL_CAPSULE | Freq: Three times a day (TID) | ORAL | 0 refills | Status: DC

## 2021-11-08 MED ORDER — OXYCODONE HCL 5 MG PO TABS: 5.0000 mg | ORAL_TABLET | Freq: Four times a day (QID) | ORAL | 0 refills | Status: DC | PRN

## 2021-11-08 MED ORDER — IPRATROPIUM BROMIDE 0.06 % NA SOLN: 2.0000 | Freq: Two times a day (BID) | NASAL | 0 refills | Status: DC

## 2021-11-08 MED ORDER — ALBUTEROL SULFATE HFA 108 (90 BASE) MCG/ACT IN AERS: 2.0000 | INHALATION_SPRAY | Freq: Four times a day (QID) | RESPIRATORY_TRACT | 0 refills | Status: DC | PRN

## 2021-11-08 MED ORDER — LEVOTHYROXINE SODIUM 150 MCG PO TABS: 150.0000 ug | ORAL_TABLET | Freq: Every day | ORAL | 0 refills | Status: DC

## 2021-11-08 NOTE — Progress Notes (Signed)
Patient given discharge instructions and work note. Patient verbalized understanding. IV removed, cath tip intact. Provided patient disposable scrubs to wear home. All patient belongings returned. ?

## 2021-11-08 NOTE — Discharge Summary (Signed)
Physician Discharge Summary   Patient: David Ramsey MRN: [id number] DOB: 01-11-1996  Admit date:     11/04/2021  Discharge date: 11/08/21  Discharge Physician: Loletha Grayer   PCP: Patient, No Pcp Per (Inactive)   Recommendations at discharge:   Patient given the name of Dr. Etta Quill office to set up appointment for PCP  Discharge Diagnoses: Principal Problem:   Multifocal pneumonia Active Problems:   Acute pancreatitis   DKA (diabetic ketoacidosis) ([place])   AKI (acute kidney injury) ([place])   Hypothyroid   Hypokalemia   Type 1 diabetes mellitus with hyperglycemia ([place])   DKA, type 1, not at goal [facility])   Nausea & vomiting   Leukocytosis   Diabetic neuropathy ([place])   Elevated d-dimer    Hospital Course: Patient was admitted to the hospital on 11/04/2021 and discharged on 11/08/2021.  Initially came in with diabetic ketoacidosis and started on insulin drip.  The patient was converted over to [facility] insulin.  His insurance covers Levemir insulin.    The patient was also diagnosed with acute pancreatitis and received aggressive IV fluid hydration.  Patient does have history of drinking alcohol in the past and was advised not to drink any further alcohol.  Triglycerides normal range.  ANA negative, IgG4 normal range.  At the time of discharge the patient's abdominal pain was much improved and discharged home in stable condition.  A few pain pills prescribed.  During the hospital course the patient did have some pleuritic chest pain and was started on nebulizer treatments.  A CT angio of the chest was negative for pulmonary embolism but did show multifocal pneumonia.  The patient was started on Zosyn and discharged on Augmentin.  Assessment and Plan: * Multifocal pneumonia Patient received a few days of Zosyn and will be discharged on a few more days of Augmentin.  Acute pancreatitis Patient's lipase down to 96 as of yesterday from a peak of over 500.  Patient tolerated  advance diet from yesterday and stable for disposition.  DKA (diabetic ketoacidosis) ([place]) Patient on Semglee insulin 24 units while here.  His insurance covers Levemir insulin so that was prescribed.  The patient will go back on his Humalog sliding scale prior to meals  AKI (acute kidney injury) ([place]) Creatinine 1.55 on presentation and down to 0.83.  Hypothyroid Hypothyroidism unspecified.  Continue levothyroxine  Hypokalemia Potassium replaced into the normal range.  Elevated d-dimer CT scan of the chest negative for PE, ultrasound bilateral lower extremity negative for DVT.  Diabetic neuropathy ([place]) Continue gabapentin           Consultants: None Procedures performed: None Disposition: Home Diet recommendation:  Carb modified diet  DISCHARGE MEDICATION: Allergies as of 11/08/2021       Reactions   Tape Rash   Blisters- can use kerlix tape        Medication List     STOP taking these medications    insulin glargine 100 UNIT/ML Solostar Pen Commonly known as: LANTUS       TAKE these medications    acetaminophen 500 MG tablet Commonly known as: TYLENOL Take 1,000 mg by mouth every 6 (six) hours as needed for moderate pain or headache.   albuterol 108 (90 Base) MCG/ACT inhaler Commonly known as: VENTOLIN HFA Inhale 2 puffs into the lungs every 6 (six) hours as needed for wheezing or shortness of breath.   amoxicillin-clavulanate 875-125 MG tablet Commonly known as: Augmentin Take 1 tablet by mouth 2 (two) times daily  for 3 days.   blood glucose meter kit and supplies Kit Dispense based on patient and insurance preference. Use up to four times daily as directed.   FLUoxetine 20 MG capsule Commonly known as: PROZAC Take 1 capsule (20 mg total) by mouth daily.   gabapentin 300 MG capsule Commonly known as: NEURONTIN Take 1 capsule (300 mg total) by mouth 3 (three) times daily.   HumaLOG KwikPen 100 UNIT/ML KwikPen Generic drug: insulin  lispro Inject 0-9 Units into the skin 3 (three) times daily. Sliding scale insulin Less than 70 initiate hypoglycemia protocol 70-120  0 units 120-150 1 unit 151-200 2 units 201-250 3 units 251-300 5 units 301-350 7 units 351-400 9 units Greater than 400 call MD   insulin detemir 100 UNIT/ML FlexPen Commonly known as: LEVEMIR Inject 24 Units into the skin daily.   ipratropium 0.06 % nasal spray Commonly known as: ATROVENT Place 2 sprays into both nostrils 2 (two) times daily.   levothyroxine 150 MCG tablet Commonly known as: SYNTHROID Take 1 tablet (150 mcg total) by mouth daily.   oxyCODONE 5 MG immediate release tablet Commonly known as: Oxy IR/ROXICODONE Take 1 tablet (5 mg total) by mouth every 6 (six) hours as needed for severe pain.   Pen Needles 3/16" 31G X 5 MM Misc 1 Container by Does not apply route 4 (four) times daily -  before meals and at bedtime.   triamcinolone cream 0.1 % Commonly known as: KENALOG Apply topically 2 (two) times daily.        Follow-up Information     Lavera Guise, MD Follow up in 1 week(s).   Specialties: Internal Medicine, Cardiology Why: hospital follow up Contact information: [place] [address] [phone]                 Discharge Exam: Filed Weights   11/04/21 2035  Weight: 66.8 kg   Physical Exam HENT:     Head: Normocephalic.     Mouth/Throat:     Pharynx: No oropharyngeal exudate.  Eyes:     General: Lids are normal.     Conjunctiva/sclera: Conjunctivae normal.  Cardiovascular:     Rate and Rhythm: Normal rate and regular rhythm.     Heart sounds: Normal heart sounds, S1 normal and S2 normal.  Pulmonary:     Breath sounds: Examination of the right-lower field reveals decreased breath sounds. Examination of the left-lower field reveals decreased breath sounds. Decreased breath sounds present. No wheezing, rhonchi or rales.  Abdominal:     Palpations: Abdomen is soft.     Tenderness: There  is no abdominal tenderness.  Musculoskeletal:     Right lower leg: No swelling.     Left lower leg: No swelling.  Skin:    General: Skin is warm.     Findings: No rash.  Neurological:     Mental Status: He is alert and oriented to person, place, and time.     Condition at discharge: stable  The results of significant diagnostics from this hospitalization (including imaging, microbiology, ancillary and laboratory) are listed below for reference.   Imaging Studies: CT Angio Chest Pulmonary Embolism (PE) W or WO Contrast  Result Date: 11/06/2021 CLINICAL DATA:  Chest pain EXAM: CT ANGIOGRAPHY CHEST WITH CONTRAST TECHNIQUE: Multidetector CT imaging of the chest was performed using the standard protocol during bolus administration of intravenous contrast. Multiplanar CT image reconstructions and MIPs were obtained to evaluate the vascular anatomy. RADIATION DOSE REDUCTION: This exam  was performed according to the departmental dose-optimization program which includes automated exposure control, adjustment of the mA and/or kV according to patient size and/or use of iterative reconstruction technique. CONTRAST:  72m OMNIPAQUE IOHEXOL 350 MG/ML SOLN COMPARISON:  Chest radiographs done on 11/04/2021 FINDINGS: Cardiovascular: There is homogeneous enhancement in thoracic aorta. There are no intraluminal filling defects in the pulmonary artery branches. Evaluation of small peripheral branches in the lower lung fields is limited by infiltrates and motion. Mediastinum/Nodes: There are slightly enlarged lymph nodes in mediastinum and hilar regions. There are slightly enlarged lymph nodes in both axillary regions. Lungs/Pleura: There are small patchy infiltrates in the posterior right upper lobe, lateral segment of right middle lobe and in the right lower lobe. There are patchy infiltrates in the left lower lobe. Minimal bilateral pleural effusions are seen. There is no pneumothorax. Upper Abdomen: Small hiatal  hernia is seen. Musculoskeletal: Unremarkable. Review of the MIP images confirms the above findings. IMPRESSION: There is no evidence of pulmonary artery embolism. There is no evidence of thoracic aortic dissection. There are small patchy infiltrates in the right upper lobe, right middle lobe and both lower lobes suggesting possible multifocal pneumonia. Minimal bilateral pleural effusions.  Small hiatal hernia. Electronically Signed   By: PElmer PickerM.D.   On: 11/06/2021 19:39   CT ABDOMEN PELVIS W CONTRAST  Result Date: 11/04/2021 CLINICAL DATA:  Abdominal pain, acute, nonlocalized. Nausea and vomiting. EXAM: CT ABDOMEN AND PELVIS WITH CONTRAST TECHNIQUE: Multidetector CT imaging of the abdomen and pelvis was performed using the standard protocol following bolus administration of intravenous contrast. RADIATION DOSE REDUCTION: This exam was performed according to the departmental dose-optimization program which includes automated exposure control, adjustment of the mA and/or kV according to patient size and/or use of iterative reconstruction technique. CONTRAST:  1072mOMNIPAQUE IOHEXOL 300 MG/ML  SOLN COMPARISON:  12/04/2016 FINDINGS: Lower chest: Small patchy densities at the right lung base are suggestive for atelectasis. No pleural effusions. Hepatobiliary: Normal appearance of the liver, gallbladder and portal venous system. Pancreas: Small calcifications involving the pancreatic head and uncinate process are suggestive for prior inflammation. Edema and low-density around the pancreatic body best seen on sequence 2 image 25 and sequence 5 image 43. No significant pancreatic duct dilatation. Spleen: Normal in size without focal abnormality. Adrenals/Urinary Tract: Normal adrenal glands. Urinary bladder is mildly distended. Contrast in both renal collecting systems. No hydronephrosis. Small amount of streak artifact in the left kidney related to a structure external to the patient. No suspicious  renal lesions. Stomach/Bowel: Normal appearance of the stomach. Moderate amount of stool in the colon. There is no evidence for acute bowel inflammation. Normal appendix. Vascular/Lymphatic: No significant vascular findings are present. No enlarged abdominal or pelvic lymph nodes. Reproductive: Prostate is unremarkable. Other: Negative for free fluid.  Negative for free air. Musculoskeletal: No acute bone abnormality. IMPRESSION: 1. Peripancreatic edema around the pancreatic body. Findings are concerning for acute pancreatitis. 2. Calcifications at the pancreatic head and uncinate process are compatible with prior pancreatic inflammation. 3. Urinary bladder distension. Electronically Signed   By: AdMarkus Daft.D.   On: 11/04/2021 16:34   [place] Img Lower Bilateral (DVT)  Result Date: 11/07/2021 CLINICAL DATA:  Elevated D-dimer.  Leg pain. EXAM: BILATERAL LOWER EXTREMITY VENOUS DOPPLER ULTRASOUND TECHNIQUE: Gray-scale sonography with graded compression, as well as color Doppler and duplex ultrasound were performed to evaluate the lower extremity deep venous systems from the level of the common femoral vein and including the common  femoral, femoral, profunda femoral, popliteal and calf veins including the posterior tibial, peroneal and gastrocnemius veins when visible. The superficial great saphenous vein was also interrogated. Spectral Doppler was utilized to evaluate flow at rest and with distal augmentation maneuvers in the common femoral, femoral and popliteal veins. COMPARISON:  None. FINDINGS: RIGHT LOWER EXTREMITY Common Femoral Vein: No evidence of thrombus. Normal compressibility, respiratory phasicity and response to augmentation. Saphenofemoral Junction: No evidence of thrombus. Normal compressibility and flow on color Doppler imaging. Profunda Femoral Vein: No evidence of thrombus. Normal compressibility and flow on color Doppler imaging. Femoral Vein: No evidence of thrombus. Normal compressibility,  respiratory phasicity and response to augmentation. Popliteal Vein: No evidence of thrombus. Normal compressibility, respiratory phasicity and response to augmentation. Calf Veins: No evidence of thrombus. Normal compressibility and flow on color Doppler imaging. LEFT LOWER EXTREMITY Common Femoral Vein: No evidence of thrombus. Normal compressibility, respiratory phasicity and response to augmentation. Saphenofemoral Junction: No evidence of thrombus. Normal compressibility and flow on color Doppler imaging. Profunda Femoral Vein: No evidence of thrombus. Normal compressibility and flow on color Doppler imaging. Femoral Vein: No evidence of thrombus. Normal compressibility, respiratory phasicity and response to augmentation. Popliteal Vein: No evidence of thrombus. Normal compressibility, respiratory phasicity and response to augmentation. Calf Veins: No evidence of thrombus. Normal compressibility and flow on color Doppler imaging. IMPRESSION: No evidence of deep venous thrombosis in either lower extremity. Electronically Signed   By: Markus Daft M.D.   On: 11/07/2021 17:18   Portable chest x-ray (1 view)  Result Date: 11/04/2021 CLINICAL DATA:  Nausea and vomiting, diabetes, hyperglycemia EXAM: PORTABLE CHEST 1 VIEW COMPARISON:  03/26/2021 FINDINGS: Single frontal view of the chest demonstrates an unremarkable cardiac silhouette. No acute airspace disease, effusion, or pneumothorax. No acute bony abnormalities. IMPRESSION: 1. No acute intrathoracic process. Electronically Signed   By: Randa Ngo M.D.   On: 11/04/2021 18:40   US Abdomen Limited RUQ (LIVER/GB)  Result Date: 11/05/2021 CLINICAL DATA:  Acute pancreatitis EXAM: ULTRASOUND ABDOMEN LIMITED RIGHT UPPER QUADRANT COMPARISON:  Abdominal ultrasound 11/01/2020 FINDINGS: Gallbladder: No gallstones or wall thickening visualized. No sonographic Murphy sign noted by sonographer. Common bile duct: Diameter: 3 mm Liver: No focal lesion identified. Within  normal limits in parenchymal echogenicity. Portal vein is patent on color Doppler imaging with normal direction of blood flow towards the liver. Other: None. IMPRESSION: Unremarkable right upper quadrant sonogram. Electronically Signed   By: Ofilia Neas M.D.   On: 11/05/2021 12:34    Microbiology: Results for orders placed or performed during the hospital encounter of 11/04/21  Resp Panel by RT-PCR (Flu A&B, Covid) Nasopharyngeal Swab     Status: None   Collection Time: 11/04/21  6:22 PM   Specimen: Nasopharyngeal Swab; Nasopharyngeal(NP) swabs in vial transport medium  Result Value Ref Range Status   SARS Coronavirus 2 by RT PCR NEGATIVE NEGATIVE Final    Comment: (NOTE) SARS-CoV-2 target nucleic acids are NOT DETECTED.  The SARS-CoV-2 RNA is generally detectable in upper respiratory specimens during the acute phase of infection. The lowest concentration of SARS-CoV-2 viral copies this assay can detect is 138 copies/mL. A negative result does not preclude SARS-Cov-2 infection and should not be used as the sole basis for treatment or other patient management decisions. A negative result may occur with  improper specimen collection/handling, submission of specimen other than nasopharyngeal swab, presence of viral mutation(s) within the areas targeted by this assay, and inadequate number of viral copies(<138 copies/mL). A negative result must  be combined with clinical observations, patient history, and epidemiological information. The expected result is Negative.  Fact Sheet for Patients:  EntrepreneurPulse.com.au  Fact Sheet for Healthcare Providers:  IncredibleEmployment.be  This test is no t yet approved or cleared by the [place] FDA and  has been authorized for detection and/or diagnosis of SARS-CoV-2 by FDA under an Emergency Use Authorization (EUA). This EUA will remain  in effect (meaning this test can be used) for the duration of  the COVID-19 declaration under Section 564(b)(1) of the Act, 21 U.S.C.section 360bbb-3(b)(1), unless the authorization is terminated  or revoked sooner.       Influenza A by PCR NEGATIVE NEGATIVE Final   Influenza B by PCR NEGATIVE NEGATIVE Final    Comment: (NOTE) The Xpert Xpress SARS-CoV-2/FLU/RSV plus assay is intended as an aid in the diagnosis of influenza from Nasopharyngeal swab specimens and should not be used as a sole basis for treatment. Nasal washings and aspirates are unacceptable for Xpert Xpress SARS-CoV-2/FLU/RSV testing.  Fact Sheet for Patients: EntrepreneurPulse.com.au  Fact Sheet for Healthcare Providers: IncredibleEmployment.be  This test is not yet approved or cleared by the [place] FDA and has been authorized for detection and/or diagnosis of SARS-CoV-2 by FDA under an Emergency Use Authorization (EUA). This EUA will remain in effect (meaning this test can be used) for the duration of the COVID-19 declaration under Section 564(b)(1) of the Act, 21 U.S.C. section 360bbb-3(b)(1), unless the authorization is terminated or revoked.  Performed at [facility], [address] [place], [place] [address]   MRSA Next Gen by PCR, Nasal     Status: None   Collection Time: 11/04/21  8:35 PM   Specimen: Nasal Mucosa; Nasal Swab  Result Value Ref Range Status   MRSA by PCR Next Gen NOT DETECTED NOT DETECTED Final    Comment: (NOTE) The GeneXpert MRSA Assay (FDA approved for NASAL specimens only), is one component of a comprehensive MRSA colonization surveillance program. It is not intended to diagnose MRSA infection nor to guide or monitor treatment for MRSA infections. Test performance is not FDA approved in patients less than 71 years old. Performed at [facility], [address] [place], [place] [address]     Labs: CBC: Recent Labs  Lab 11/04/21 1439 11/06/21 0429  WBC 14.6* 7.1  HGB  15.5 11.5*  HCT 46.2 34.0*  MCV 89.5 89.0  PLT 482* 277   Basic Metabolic Panel: Recent Labs  Lab 11/05/21 0216 11/05/21 0719 11/05/21 1016 11/06/21 0429 11/07/21 0331  NA 139 139 140 140 138  K 3.9 3.8 3.6 2.9* 5.0  CL 108 106 108 110 109  CO2 20* 22 24 25 27   GLUCOSE 168* 210* 194* 109* 308*  BUN 15 12 12 6  <5*  CREATININE 1.19 1.21 1.12 0.81 0.83  CALCIUM 8.9 8.7* 8.8* 8.1* 8.1*  MG  --   --   --  1.9  --    Liver Function Tests: Recent Labs  Lab 11/04/21 1446  AST 32  ALT 39  ALKPHOS 84  BILITOT 1.8*  PROT 8.4*  ALBUMIN 4.7   CBG: Recent Labs  Lab 11/07/21 0800 11/07/21 1202 11/07/21 1729 11/07/21 2220 11/08/21 0746  GLUCAP 410* 257* 187* 257* 341*    Discharge time spent: greater than 30 minutes.  Signed: Loletha Grayer, MD [facility] 11/08/2021

## 2021-11-08 NOTE — Plan of Care (Signed)
Patient sleeping between care. No new changes in assessment. ? ? ? ?Problem: Education: ?Goal: Knowledge of General Education information will improve ?Description: Including pain rating scale, medication(s)/side effects and non-pharmacologic comfort measures ?Outcome: Progressing ?  ?Problem: Health Behavior/Discharge Planning: ?Goal: Ability to manage health-related needs will improve ?Outcome: Progressing ?  ?Problem: Clinical Measurements: ?Goal: Ability to maintain clinical measurements within normal limits will improve ?Outcome: Progressing ?Goal: Will remain free from infection ?Outcome: Progressing ?Goal: Diagnostic test results will improve ?Outcome: Progressing ?  ?Problem: Education: ?Goal: Ability to describe self-care measures that may prevent or decrease complications (Diabetes Survival Skills Education) will improve ?Outcome: Progressing ?Goal: Individualized Educational Video(s) ?Outcome: Progressing ?  ?Problem: Cardiac: ?Goal: Ability to maintain an adequate cardiac output will improve ?Outcome: Progressing ?  ?Problem: Health Behavior/Discharge Planning: ?Goal: Ability to identify and utilize available resources and services will improve ?Outcome: Progressing ?Goal: Ability to manage health-related needs will improve ?Outcome: Progressing ?  ?Problem: Fluid Volume: ?Goal: Ability to achieve a balanced intake and output will improve ?Outcome: Progressing ?  ?Problem: Metabolic: ?Goal: Ability to maintain appropriate glucose levels will improve ?Outcome: Progressing ?  ?Problem: Nutritional: ?Goal: Maintenance of adequate nutrition will improve ?Outcome: Progressing ?Goal: Maintenance of adequate weight for body size and type will improve ?Outcome: Progressing ?  ?Problem: Respiratory: ?Goal: Will regain and/or maintain adequate ventilation ?Outcome: Progressing ?  ?Problem: Urinary Elimination: ?Goal: Ability to achieve and maintain adequate renal perfusion and functioning will improve ?Outcome:  Progressing ?  ?

## 2021-11-30 ENCOUNTER — Emergency Department: Payer: BC Managed Care – PPO

## 2021-11-30 ENCOUNTER — Other Ambulatory Visit: Payer: Self-pay

## 2021-11-30 ENCOUNTER — Observation Stay: Payer: BC Managed Care – PPO

## 2021-11-30 ENCOUNTER — Observation Stay
Admission: EM | Admit: 2021-11-30 | Discharge: 2021-12-01 | Disposition: A | Discharge status: Home / Self Care | Payer: BC Managed Care – PPO | Attending: Internal Medicine | Admitting: Internal Medicine

## 2021-11-30 DIAGNOSIS — R7989 Other specified abnormal findings of blood chemistry: Secondary | ICD-10-CM | POA: Diagnosis present

## 2021-11-30 DIAGNOSIS — E1010 Type 1 diabetes mellitus with ketoacidosis without coma: Secondary | ICD-10-CM | POA: Diagnosis not present

## 2021-11-30 DIAGNOSIS — R059 Cough, unspecified: Secondary | ICD-10-CM | POA: Diagnosis not present

## 2021-11-30 DIAGNOSIS — E1065 Type 1 diabetes mellitus with hyperglycemia: Secondary | ICD-10-CM | POA: Insufficient documentation

## 2021-11-30 DIAGNOSIS — R531 Weakness: Secondary | ICD-10-CM | POA: Diagnosis not present

## 2021-11-30 DIAGNOSIS — K861 Other chronic pancreatitis: Secondary | ICD-10-CM | POA: Diagnosis not present

## 2021-11-30 DIAGNOSIS — Z20822 Contact with and (suspected) exposure to covid-19: Secondary | ICD-10-CM | POA: Diagnosis not present

## 2021-11-30 DIAGNOSIS — Z7984 Long term (current) use of oral hypoglycemic drugs: Secondary | ICD-10-CM | POA: Insufficient documentation

## 2021-11-30 DIAGNOSIS — R1084 Generalized abdominal pain: Secondary | ICD-10-CM | POA: Diagnosis not present

## 2021-11-30 DIAGNOSIS — K859 Acute pancreatitis without necrosis or infection, unspecified: Secondary | ICD-10-CM | POA: Diagnosis not present

## 2021-11-30 DIAGNOSIS — E039 Hypothyroidism, unspecified: Secondary | ICD-10-CM | POA: Diagnosis not present

## 2021-11-30 DIAGNOSIS — Z87891 Personal history of nicotine dependence: Secondary | ICD-10-CM | POA: Diagnosis not present

## 2021-11-30 DIAGNOSIS — E1042 Type 1 diabetes mellitus with diabetic polyneuropathy: Secondary | ICD-10-CM | POA: Diagnosis not present

## 2021-11-30 DIAGNOSIS — E875 Hyperkalemia: Secondary | ICD-10-CM

## 2021-11-30 DIAGNOSIS — Z79899 Other long term (current) drug therapy: Secondary | ICD-10-CM | POA: Insufficient documentation

## 2021-11-30 DIAGNOSIS — N3289 Other specified disorders of bladder: Secondary | ICD-10-CM | POA: Diagnosis not present

## 2021-11-30 DIAGNOSIS — R509 Fever, unspecified: Secondary | ICD-10-CM | POA: Diagnosis not present

## 2021-11-30 DIAGNOSIS — R945 Abnormal results of liver function studies: Secondary | ICD-10-CM | POA: Insufficient documentation

## 2021-11-30 DIAGNOSIS — R109 Unspecified abdominal pain: Secondary | ICD-10-CM | POA: Diagnosis present

## 2021-11-30 DIAGNOSIS — R000 Tachycardia, unspecified: Secondary | ICD-10-CM | POA: Diagnosis not present

## 2021-11-30 DIAGNOSIS — E872 Acidosis, unspecified: Secondary | ICD-10-CM | POA: Diagnosis present

## 2021-11-30 DIAGNOSIS — Z794 Long term (current) use of insulin: Secondary | ICD-10-CM | POA: Insufficient documentation

## 2021-11-30 HISTORY — DX: Type 1 diabetes mellitus with diabetic polyneuropathy: E10.42

## 2021-11-30 LAB — COMPREHENSIVE METABOLIC PANEL
ALT: 107 U/L — ABNORMAL HIGH (ref 0–44)
AST: 134 U/L — ABNORMAL HIGH (ref 15–41)
Albumin: 4.2 g/dL (ref 3.5–5.0)
Alkaline Phosphatase: 91 U/L (ref 38–126)
Anion gap: 16 — ABNORMAL HIGH (ref 5–15)
BUN: 25 mg/dL — ABNORMAL HIGH (ref 6–20)
CO2: 23 mmol/L (ref 22–32)
Calcium: 9.8 mg/dL (ref 8.9–10.3)
Chloride: 92 mmol/L — ABNORMAL LOW (ref 98–111)
Creatinine, Ser: 1.35 mg/dL — ABNORMAL HIGH (ref 0.61–1.24)
GFR, Estimated: >60 mL/min (ref >60)
Glucose, Bld: 377 mg/dL — ABNORMAL HIGH (ref 70–99)
Potassium: 3.8 mmol/L (ref 3.5–5.1)
Sodium: 131 mmol/L — ABNORMAL LOW (ref 135–145)
Total Bilirubin: 1.3 mg/dL — ABNORMAL HIGH (ref 0.3–1.2)
Total Protein: 7.7 g/dL (ref 6.5–8.1)

## 2021-11-30 LAB — BASIC METABOLIC PANEL
Anion gap: 7 (ref 5–15)
Anion gap: 11 (ref 5–15)
Anion gap: 9 (ref 5–15)
Anion gap: 11 (ref 5–15)
Anion gap: 9 (ref 5–15)
Anion gap: 5 (ref 5–15)
BUN: 23 mg/dL — ABNORMAL HIGH (ref 6–20)
BUN: 19 mg/dL (ref 6–20)
BUN: 18 mg/dL (ref 6–20)
BUN: 19 mg/dL (ref 6–20)
BUN: 19 mg/dL (ref 6–20)
BUN: 17 mg/dL (ref 6–20)
CO2: 28 mmol/L (ref 22–32)
CO2: 27 mmol/L (ref 22–32)
CO2: 26 mmol/L (ref 22–32)
CO2: 25 mmol/L (ref 22–32)
CO2: 28 mmol/L (ref 22–32)
CO2: 32 mmol/L (ref 22–32)
Calcium: 9 mg/dL (ref 8.9–10.3)
Calcium: 8.8 mg/dL — ABNORMAL LOW (ref 8.9–10.3)
Calcium: 8.8 mg/dL — ABNORMAL LOW (ref 8.9–10.3)
Calcium: 9 mg/dL (ref 8.9–10.3)
Calcium: 9.3 mg/dL (ref 8.9–10.3)
Calcium: 8.7 mg/dL — ABNORMAL LOW (ref 8.9–10.3)
Chloride: 99 mmol/L (ref 98–111)
Chloride: 94 mmol/L — ABNORMAL LOW (ref 98–111)
Chloride: 92 mmol/L — ABNORMAL LOW (ref 98–111)
Chloride: 92 mmol/L — ABNORMAL LOW (ref 98–111)
Chloride: 99 mmol/L (ref 98–111)
Chloride: 103 mmol/L (ref 98–111)
Creatinine, Ser: 1.03 mg/dL (ref 0.61–1.24)
Creatinine, Ser: 1.15 mg/dL (ref 0.61–1.24)
Creatinine, Ser: 1.18 mg/dL (ref 0.61–1.24)
Creatinine, Ser: 1.23 mg/dL (ref 0.61–1.24)
Creatinine, Ser: 1.33 mg/dL — ABNORMAL HIGH (ref 0.61–1.24)
Creatinine, Ser: 1.13 mg/dL (ref 0.61–1.24)
GFR, Estimated: >60 mL/min (ref >60)
GFR, Estimated: >60 mL/min (ref >60)
GFR, Estimated: >60 mL/min (ref >60)
GFR, Estimated: >60 mL/min (ref >60)
GFR, Estimated: >60 mL/min (ref >60)
GFR, Estimated: >60 mL/min (ref >60)
Glucose, Bld: 141 mg/dL — ABNORMAL HIGH (ref 70–99)
Glucose, Bld: 308 mg/dL — ABNORMAL HIGH (ref 70–99)
Glucose, Bld: 524 mg/dL (ref 70–99)
Glucose, Bld: 467 mg/dL — ABNORMAL HIGH (ref 70–99)
Glucose, Bld: 231 mg/dL — ABNORMAL HIGH (ref 70–99)
Glucose, Bld: 162 mg/dL — ABNORMAL HIGH (ref 70–99)
Potassium: 3.6 mmol/L (ref 3.5–5.1)
Potassium: 4.2 mmol/L (ref 3.5–5.1)
Potassium: >7.5 mmol/L (ref 3.5–5.1)
Potassium: >7.5 mmol/L (ref 3.5–5.1)
Potassium: 4.2 mmol/L (ref 3.5–5.1)
Potassium: 4.4 mmol/L (ref 3.5–5.1)
Sodium: 134 mmol/L — ABNORMAL LOW (ref 135–145)
Sodium: 132 mmol/L — ABNORMAL LOW (ref 135–145)
Sodium: 127 mmol/L — ABNORMAL LOW (ref 135–145)
Sodium: 128 mmol/L — ABNORMAL LOW (ref 135–145)
Sodium: 136 mmol/L (ref 135–145)
Sodium: 140 mmol/L (ref 135–145)

## 2021-11-30 LAB — GLUCOSE, CAPILLARY
Glucose-Capillary: 508 mg/dL (ref 70–99)
Glucose-Capillary: 379 mg/dL — ABNORMAL HIGH (ref 70–99)
Glucose-Capillary: 284 mg/dL — ABNORMAL HIGH (ref 70–99)
Glucose-Capillary: 161 mg/dL — ABNORMAL HIGH (ref 70–99)
Glucose-Capillary: 78 mg/dL (ref 70–99)

## 2021-11-30 LAB — PROCALCITONIN: Procalcitonin: <0.1 ng/mL

## 2021-11-30 LAB — BETA-HYDROXYBUTYRIC ACID
Beta-Hydroxybutyric Acid: 1.29 mmol/L — ABNORMAL HIGH (ref 0.05–0.27)
Beta-Hydroxybutyric Acid: 0.19 mmol/L (ref 0.05–0.27)
Beta-Hydroxybutyric Acid: 2 mmol/L — ABNORMAL HIGH (ref 0.05–0.27)
Beta-Hydroxybutyric Acid: <0.05 mmol/L — ABNORMAL LOW (ref 0.05–0.27)

## 2021-11-30 LAB — RESP PANEL BY RT-PCR (FLU A&B, COVID) ARPGX2
Influenza A by PCR: NEGATIVE
Influenza B by PCR: NEGATIVE
SARS Coronavirus 2 by RT PCR: NEGATIVE

## 2021-11-30 LAB — CK: Total CK: 75 U/L (ref 49–397)

## 2021-11-30 LAB — BLOOD GAS, VENOUS
Acid-Base Excess: 1 mmol/L (ref 0.0–2.0)
Bicarbonate: 26 mmol/L (ref 20.0–28.0)
O2 Saturation: 73.2 %
Patient temperature: 37
pCO2, Ven: 42 mmHg — ABNORMAL LOW (ref 44–60)
pH, Ven: 7.4 (ref 7.25–7.43)
pO2, Ven: 42 mmHg (ref 32–45)

## 2021-11-30 LAB — CBG MONITORING, ED
Glucose-Capillary: 387 mg/dL — ABNORMAL HIGH (ref 70–99)
Glucose-Capillary: 248 mg/dL — ABNORMAL HIGH (ref 70–99)
Glucose-Capillary: 161 mg/dL — ABNORMAL HIGH (ref 70–99)
Glucose-Capillary: 106 mg/dL — ABNORMAL HIGH (ref 70–99)
Glucose-Capillary: 230 mg/dL — ABNORMAL HIGH (ref 70–99)

## 2021-11-30 LAB — CBC WITH DIFFERENTIAL/PLATELET
Abs Immature Granulocytes: 0.02 10*3/uL (ref 0.00–0.07)
Basophils Absolute: 0 10*3/uL (ref 0.0–0.1)
Basophils Relative: 1 %
Eosinophils Absolute: 0.2 10*3/uL (ref 0.0–0.5)
Eosinophils Relative: 3 %
HCT: 41.4 % (ref 39.0–52.0)
Hemoglobin: 14.1 g/dL (ref 13.0–17.0)
Immature Granulocytes: 0 %
Lymphocytes Relative: 28 %
Lymphs Abs: 1.8 10*3/uL (ref 0.7–4.0)
MCH: 30.3 pg (ref 26.0–34.0)
MCHC: 34.1 g/dL (ref 30.0–36.0)
MCV: 88.8 fL (ref 80.0–100.0)
Monocytes Absolute: 0.7 10*3/uL (ref 0.1–1.0)
Monocytes Relative: 11 %
Neutro Abs: 3.7 10*3/uL (ref 1.7–7.7)
Neutrophils Relative %: 57 %
Platelets: 442 10*3/uL — ABNORMAL HIGH (ref 150–400)
RBC: 4.66 MIL/uL (ref 4.22–5.81)
RDW: 12.2 % (ref 11.5–15.5)
WBC: 6.4 10*3/uL (ref 4.0–10.5)
nRBC: 0 % (ref 0.0–0.2)

## 2021-11-30 LAB — LACTIC ACID, PLASMA
Lactic Acid, Venous: 5.4 mmol/L (ref 0.5–1.9)
Lactic Acid, Venous: 3.3 mmol/L (ref 0.5–1.9)
Lactic Acid, Venous: 1.2 mmol/L (ref 0.5–1.9)

## 2021-11-30 LAB — TROPONIN I (HIGH SENSITIVITY)
Troponin I (High Sensitivity): 3 ng/L (ref <18)
Troponin I (High Sensitivity): 3 ng/L (ref <18)

## 2021-11-30 LAB — LIPASE, BLOOD: Lipase: 174 U/L — ABNORMAL HIGH (ref 11–51)

## 2021-11-30 MED ORDER — INSULIN ASPART 100 UNIT/ML IJ SOLN: 0.0000 [IU] | Freq: Every day | INTRAMUSCULAR | Status: DC

## 2021-11-30 MED ORDER — INSULIN DETEMIR 100 UNIT/ML FLEXPEN: 24.0000 [IU] | PEN_INJECTOR | Freq: Every day | SUBCUTANEOUS | Status: DC

## 2021-11-30 MED ORDER — FENTANYL CITRATE PF 50 MCG/ML IJ SOSY
50.0000 ug | PREFILLED_SYRINGE | Freq: Once | INTRAMUSCULAR | Status: AC
  Administered 2021-11-30: 50 ug via INTRAVENOUS
  Filled 2021-11-30: qty 1

## 2021-11-30 MED ORDER — INSULIN ASPART 100 UNIT/ML IJ SOLN
0.0000 [IU] | Freq: Three times a day (TID) | INTRAMUSCULAR | Status: DC
  Administered 2021-12-01: 2 [IU] via SUBCUTANEOUS
  Administered 2021-12-01: 1 [IU] via SUBCUTANEOUS
  Filled 2021-11-30 (×2): qty 1

## 2021-11-30 MED ORDER — GABAPENTIN 300 MG PO CAPS
300.0000 mg | ORAL_CAPSULE | Freq: Three times a day (TID) | ORAL | Status: DC
  Administered 2021-11-30 – 2021-12-01 (×5): 300 mg via ORAL
  Filled 2021-11-30 (×5): qty 1

## 2021-11-30 MED ORDER — LACTATED RINGERS IV BOLUS
1000.0000 mL | INTRAVENOUS | Status: AC
  Administered 2021-11-30: 1000 mL via INTRAVENOUS

## 2021-11-30 MED ORDER — FLUOXETINE HCL 20 MG PO CAPS
20.0000 mg | ORAL_CAPSULE | Freq: Every day | ORAL | Status: DC
Start: 2021-11-30 — End: 2021-12-02
  Administered 2021-11-30 – 2021-12-01 (×2): 20 mg via ORAL
  Filled 2021-11-30 (×2): qty 1

## 2021-11-30 MED ORDER — LACTATED RINGERS IV BOLUS
1000.0000 mL | Freq: Once | INTRAVENOUS | Status: AC
  Administered 2021-11-30: 1000 mL via INTRAVENOUS

## 2021-11-30 MED ORDER — LEVOTHYROXINE SODIUM 50 MCG PO TABS
150.0000 ug | ORAL_TABLET | Freq: Every day | ORAL | Status: DC
  Administered 2021-11-30: 150 ug via ORAL
  Filled 2021-11-30: qty 3

## 2021-11-30 MED ORDER — DEXTROSE IN LACTATED RINGERS 5 % IV SOLN: INTRAVENOUS | Status: DC

## 2021-11-30 MED ORDER — ACETAMINOPHEN 325 MG PO TABS
650.0000 mg | ORAL_TABLET | Freq: Four times a day (QID) | ORAL | Status: DC | PRN
Start: 2021-11-30 — End: 2021-12-02

## 2021-11-30 MED ORDER — INSULIN DETEMIR 100 UNIT/ML ~~LOC~~ SOLN
24.0000 [IU] | Freq: Every day | SUBCUTANEOUS | Status: DC
  Administered 2021-12-01: 24 [IU] via SUBCUTANEOUS
  Filled 2021-11-30: qty 0.24

## 2021-11-30 MED ORDER — INSULIN REGULAR(HUMAN) IN NACL 100-0.9 UT/100ML-% IV SOLN: INTRAVENOUS | Status: DC

## 2021-11-30 MED ORDER — INSULIN DETEMIR 100 UNIT/ML ~~LOC~~ SOLN: 15.0000 [IU] | Freq: Every day | SUBCUTANEOUS | Status: DC

## 2021-11-30 MED ORDER — INSULIN ASPART 100 UNIT/ML IJ SOLN
0.0000 [IU] | INTRAMUSCULAR | Status: DC
  Administered 2021-11-30: 5 [IU] via SUBCUTANEOUS

## 2021-11-30 MED ORDER — ALBUTEROL SULFATE (2.5 MG/3ML) 0.083% IN NEBU
10.0000 mg | INHALATION_SOLUTION | Freq: Once | RESPIRATORY_TRACT | Status: AC
  Administered 2021-11-30: 10 mg via RESPIRATORY_TRACT
  Filled 2021-11-30: qty 12

## 2021-11-30 MED ORDER — INSULIN ASPART 100 UNIT/ML IJ SOLN
6.0000 [IU] | Freq: Three times a day (TID) | INTRAMUSCULAR | Status: DC
  Administered 2021-12-01 (×2): 6 [IU] via SUBCUTANEOUS
  Filled 2021-11-30 (×2): qty 1

## 2021-11-30 MED ORDER — INSULIN ASPART 100 UNIT/ML IJ SOLN
6.0000 [IU] | Freq: Three times a day (TID) | INTRAMUSCULAR | Status: DC
  Administered 2021-11-30: 6 [IU] via SUBCUTANEOUS
  Filled 2021-11-30: qty 1

## 2021-11-30 MED ORDER — POTASSIUM CHLORIDE 10 MEQ/100ML IV SOLN
10.0000 meq | INTRAVENOUS | Status: AC
  Administered 2021-11-30 (×2): 10 meq via INTRAVENOUS
  Filled 2021-11-30 (×2): qty 100

## 2021-11-30 MED ORDER — METOCLOPRAMIDE HCL 5 MG/ML IJ SOLN
5.0000 mg | Freq: Three times a day (TID) | INTRAMUSCULAR | Status: DC
  Administered 2021-11-30 – 2021-12-01 (×2): 5 mg via INTRAVENOUS
  Filled 2021-11-30 (×2): qty 2

## 2021-11-30 MED ORDER — INSULIN DETEMIR 100 UNIT/ML ~~LOC~~ SOLN
24.0000 [IU] | Freq: Every day | SUBCUTANEOUS | Status: DC
  Administered 2021-11-30: 24 [IU] via SUBCUTANEOUS
  Filled 2021-11-30: qty 0.24

## 2021-11-30 MED ORDER — LACTATED RINGERS IV SOLN: INTRAVENOUS | Status: DC

## 2021-11-30 MED ORDER — IPRATROPIUM-ALBUTEROL 0.5-2.5 (3) MG/3ML IN SOLN
3.0000 mL | Freq: Once | RESPIRATORY_TRACT | Status: AC
  Administered 2021-11-30: 3 mL via RESPIRATORY_TRACT
  Filled 2021-11-30: qty 3

## 2021-11-30 MED ORDER — SODIUM BICARBONATE 8.4 % IV SOLN
50.0000 meq | Freq: Once | INTRAVENOUS | Status: AC
  Administered 2021-11-30: 50 meq via INTRAVENOUS
  Filled 2021-11-30: qty 50

## 2021-11-30 MED ORDER — MORPHINE SULFATE (PF) 2 MG/ML IV SOLN
1.0000 mg | INTRAVENOUS | Status: DC | PRN
  Administered 2021-11-30 – 2021-12-01 (×4): 1 mg via INTRAVENOUS
  Filled 2021-11-30 (×4): qty 1

## 2021-11-30 MED ORDER — ONDANSETRON HCL 4 MG/2ML IJ SOLN
4.0000 mg | Freq: Four times a day (QID) | INTRAMUSCULAR | Status: DC | PRN
  Administered 2021-11-30: 4 mg via INTRAVENOUS
  Filled 2021-11-30: qty 2

## 2021-11-30 MED ORDER — ONDANSETRON HCL 4 MG/2ML IJ SOLN
4.0000 mg | Freq: Once | INTRAMUSCULAR | Status: AC
  Administered 2021-11-30: 4 mg via INTRAVENOUS
  Filled 2021-11-30: qty 2

## 2021-11-30 MED ORDER — INSULIN ASPART 100 UNIT/ML IJ SOLN
0.0000 [IU] | INTRAMUSCULAR | Status: DC
  Administered 2021-11-30: 9 [IU] via SUBCUTANEOUS
  Administered 2021-11-30: 2 [IU] via SUBCUTANEOUS
  Filled 2021-11-30 (×2): qty 1

## 2021-11-30 MED ORDER — DEXTROSE 50 % IV SOLN: 0.0000 mL | INTRAVENOUS | Status: DC | PRN

## 2021-11-30 MED ORDER — INSULIN ASPART 100 UNIT/ML IJ SOLN
0.0000 [IU] | Freq: Three times a day (TID) | INTRAMUSCULAR | Status: DC
  Administered 2021-11-30: 9 [IU] via SUBCUTANEOUS
  Filled 2021-11-30: qty 1

## 2021-11-30 MED ORDER — LACTATED RINGERS IV SOLN
INTRAVENOUS | Status: AC
Start: 2021-11-30 — End: 2021-11-30

## 2021-11-30 MED ORDER — SODIUM ZIRCONIUM CYCLOSILICATE 10 G PO PACK: 10.0000 g | PACK | Freq: Every day | ORAL | Status: DC

## 2021-11-30 MED ORDER — ACETAMINOPHEN 650 MG RE SUPP: 650.0000 mg | Freq: Four times a day (QID) | RECTAL | Status: DC | PRN

## 2021-11-30 MED ORDER — SODIUM ZIRCONIUM CYCLOSILICATE 10 G PO PACK
10.0000 g | PACK | Freq: Two times a day (BID) | ORAL | Status: DC
  Administered 2021-11-30 (×2): 10 g via ORAL
  Filled 2021-11-30 (×3): qty 1

## 2021-11-30 MED ORDER — INSULIN REGULAR(HUMAN) IN NACL 100-0.9 UT/100ML-% IV SOLN
INTRAVENOUS | Status: DC
  Administered 2021-11-30: 6 [IU]/h via INTRAVENOUS
  Filled 2021-11-30: qty 100

## 2021-11-30 MED ORDER — IOHEXOL 300 MG/ML  SOLN
80.0000 mL | Freq: Once | INTRAMUSCULAR | Status: AC | PRN
  Administered 2021-11-30: 80 mL via INTRAVENOUS

## 2021-11-30 MED ORDER — ENOXAPARIN SODIUM 40 MG/0.4ML IJ SOSY
40.0000 mg | PREFILLED_SYRINGE | INTRAMUSCULAR | Status: DC
  Administered 2021-11-30 – 2021-12-01 (×2): 40 mg via SUBCUTANEOUS
  Filled 2021-11-30 (×2): qty 0.4

## 2021-11-30 MED ORDER — OXYCODONE-ACETAMINOPHEN 5-325 MG PO TABS
1.0000 | ORAL_TABLET | Freq: Once | ORAL | Status: AC
  Administered 2021-11-30: 1 via ORAL
  Filled 2021-11-30: qty 1

## 2021-11-30 MED ORDER — POLYETHYLENE GLYCOL 3350 17 G PO PACK: 17.0000 g | PACK | Freq: Every day | ORAL | Status: DC | PRN

## 2021-11-30 MED ORDER — ONDANSETRON HCL 4 MG PO TABS: 4.0000 mg | ORAL_TABLET | Freq: Four times a day (QID) | ORAL | Status: DC | PRN

## 2021-11-30 MED ORDER — CALCIUM GLUCONATE-NACL 1-0.675 GM/50ML-% IV SOLN
1.0000 g | Freq: Once | INTRAVENOUS | Status: AC
  Administered 2021-11-30: 1000 mg via INTRAVENOUS
  Filled 2021-11-30: qty 50

## 2021-11-30 NOTE — ED Notes (Signed)
Patient resting on stretcher in room. RR even and unlabored. Patient verbalizes no new complaints at this time. Patient offered warm blanket. ?

## 2021-11-30 NOTE — Assessment & Plan Note (Addendum)
?   Patient complaining of severe pain related to his neuropathy ?? Continue Neurontin .  ?

## 2021-11-30 NOTE — Assessment & Plan Note (Addendum)
-  K of >7.5 this visit, confirmed on repeat BMET ?-Pt given amp of bicarb, IV Ca, lokelma ?-Repeat K normalized  ?

## 2021-11-30 NOTE — Assessment & Plan Note (Addendum)
?   Patient complains of ongoing generalized abdominal pain that essentially has never resolved with recent hospitalization for acute pancreatitis and pneumonia ?? Continues with generalized abd pain, more so over epigastric region ?? CT reviewed, findings of chronic pancreatitis with circumferential thickening of bladder. However, UA is neg and pt has no leukocytosis and is afebrile ?? Improved with analgesia ?

## 2021-11-30 NOTE — ED Provider Notes (Signed)
? ?[facility] ?Provider Note ? ? ? Event Date/Time  ? First MD Initiated Contact with Patient 11/30/21 0130   ?  (approximate) ? ? ?History  ? ?Hyperglycemia ? ? ?HPI ? ?David Ramsey is a 26 y.o. male history of type 1 diabetes who presents for evaluation of hyperglycemia.  Patient was admitted to the hospital in the end of February with DKA and multifocal pneumonia.  Was discharged home with antibiotics which she finished several weeks ago.  He reports that he never really felt better and continues to have cough and some shortness of breath with ambulation.  Today he was feeling very weak and fatigued and after having dinner he checked his blood glucose which was greater than 600 and came to the hospital for evaluation.  He reports subjective fevers and chills, cough productive of thick phlegm and congestion.  He denies chest pain, vomiting or diarrhea ?  ? ? ?Past Medical History:  ?Diagnosis Date  ? Diabetes mellitus without complication (HCC)   ? INSULIN DEPENDENT   SINCE AGE 44  ? DKA (diabetic ketoacidoses) 02/17/2017  ? Hashimoto's disease   ? MRSA infection   ? Pneumonia   ? Liver contusion, lung effusion, sepsis multiorgan failure, no clear source  ? ? ?Past Surgical History:  ?Procedure Laterality Date  ? CHEST TUBE INSERTION    ? ? ? ?Physical Exam  ? ?Triage Vital Signs: ?ED Triage Vitals  ?Enc Vitals Group  ?   BP 11/30/21 0141 (!) 137/99  ?   Pulse Rate 11/30/21 0141 (!) 112  ?   Resp 11/30/21 0141 (!) 24  ?   Temp 11/30/21 0141 98.4 ?F (36.9 ?C)  ?   Temp Source 11/30/21 0141 Oral  ?   SpO2 11/30/21 0141 97 %  ?   Weight 11/30/21 0142 160 lb (72.6 kg)  ?   Height 11/30/21 0142 5\' 6"  (1.676 m)  ?   Head Circumference --   ?   Peak Flow --   ?   Pain Score 11/30/21 0142 6  ?   Pain Loc --   ?   Pain Edu? --   ?   Excl. in GC? --   ? ? ?Most recent vital signs: ?Vitals:  ? 11/30/21 0141  ?BP: (!) 137/99  ?Pulse: (!) 112  ?Resp: (!) 24  ?Temp: 98.4 ?F (36.9 ?C)  ?SpO2: 97%   ? ? ? ?Constitutional: Alert and oriented. no apparent distress. ?HEENT: ?     Head: Normocephalic and atraumatic.    ?     Eyes: Conjunctivae are normal. Sclera is non-icteric.  ?     Mouth/Throat: Mucous membranes are dry.  ?     Neck: Supple with no signs of meningismus. ?Cardiovascular: Tachycardic with regular ?Respiratory: Tachypneic with normal sats, no wheezing or crackles ?Gastrointestinal: Soft, non tender, and non distended with positive bowel sounds. No rebound or guarding. ?Genitourinary: No CVA tenderness. ?Musculoskeletal:  No edema, cyanosis, or erythema of extremities. ?Neurologic: Normal speech and language. Face is symmetric. Moving all extremities. No gross focal neurologic deficits are appreciated. ?Skin: Skin is warm, dry and intact. No rash noted. ?Psychiatric: Mood and affect are normal. Speech and behavior are normal. ? ?ED Results / Procedures / Treatments  ? ?Labs ?(all labs ordered are listed, but only abnormal results are displayed) ?Labs Reviewed  ?CBC WITH DIFFERENTIAL/PLATELET - Abnormal; Notable for the following components:  ?    Result Value  ? Platelets 442 (*)   ?  All other components within normal limits  ?COMPREHENSIVE METABOLIC PANEL - Abnormal; Notable for the following components:  ? Sodium 131 (*)   ? Chloride 92 (*)   ? Glucose, Bld 377 (*)   ? BUN 25 (*)   ? Creatinine, Ser 1.35 (*)   ? AST 134 (*)   ? ALT 107 (*)   ? Total Bilirubin 1.3 (*)   ? Anion gap 16 (*)   ? All other components within normal limits  ?BLOOD GAS, VENOUS - Abnormal; Notable for the following components:  ? pCO2, Ven 42 (*)   ? All other components within normal limits  ?LACTIC ACID, PLASMA - Abnormal; Notable for the following components:  ? Lactic Acid, Venous 5.4 (*)   ? All other components within normal limits  ?BETA-HYDROXYBUTYRIC ACID - Abnormal; Notable for the following components:  ? Beta-Hydroxybutyric Acid 1.29 (*)   ? All other components within normal limits  ?CBG MONITORING, ED -  Abnormal; Notable for the following components:  ? Glucose-Capillary 387 (*)   ? All other components within normal limits  ?CBG MONITORING, ED - Abnormal; Notable for the following components:  ? Glucose-Capillary 248 (*)   ? All other components within normal limits  ?RESP PANEL BY RT-PCR (FLU A&B, COVID) [id number]  ?CULTURE, BLOOD (ROUTINE X 2)  ?CULTURE, BLOOD (ROUTINE X 2)  ?PROCALCITONIN  ?LACTIC ACID, PLASMA  ?URINALYSIS, COMPLETE (UACMP) WITH MICROSCOPIC  ?BASIC METABOLIC PANEL  ?BASIC METABOLIC PANEL  ?BASIC METABOLIC PANEL  ?BASIC METABOLIC PANEL  ?BASIC METABOLIC PANEL  ?BETA-HYDROXYBUTYRIC ACID  ?BETA-HYDROXYBUTYRIC ACID  ?BETA-HYDROXYBUTYRIC ACID  ?TROPONIN I (HIGH SENSITIVITY)  ?TROPONIN I (HIGH SENSITIVITY)  ? ? ? ?EKG ? ?ED ECG REPORT ?I, Nita Sickle, the attending physician, personally viewed and interpreted this ECG. ? ?Sinus tachycardia with a rate of 104, T wave inversions in inferior leads with no ST elevations or depressions ? ?RADIOLOGY ?I, Nita Sickle, attending MD, have personally viewed and interpreted the images obtained during this visit as below: ? ?Chest x-ray negative for pneumonia ? ? ?___________________________________________________ ?Interpretation by Radiologist:  ?DG Chest Portable 1 View ? ?Result Date: 11/30/2021 ?CLINICAL DATA:  Cough and fever EXAM: PORTABLE CHEST 1 VIEW COMPARISON:  11/06/2021 FINDINGS: The heart size and mediastinal contours are within normal limits. Both lungs are clear. The visualized skeletal structures are unremarkable. IMPRESSION: No active disease. Electronically Signed   By: Alcide Clever M.D.   On: 11/30/2021 02:02   ? ? ? ?PROCEDURES: ? ?Critical Care performed: Yes, see critical care procedure note(s) ? ?.Critical Care ?Performed by: Nita Sickle, MD ?Authorized by: Nita Sickle, MD  ? ?Critical care provider statement:  ?  Critical care time (minutes):  40 ?  Critical care was necessary to treat or prevent imminent or  life-threatening deterioration of the following conditions:  Endocrine crisis, shock, sepsis, CNS failure or compromise and circulatory failure ?  Critical care was time spent personally by me on the following activities:  Development of treatment plan with patient or surrogate, discussions with consultants, evaluation of patient's response to treatment, examination of patient, ordering and review of laboratory studies, ordering and review of radiographic studies, ordering and performing treatments and interventions, pulse oximetry, re-evaluation of patient's condition and review of old charts ?  I assumed direction of critical care for this patient from another provider in my specialty: no   ?  Care discussed with: admitting provider   ? ? ? ?IMPRESSION / MDM / ASSESSMENT AND PLAN / ED  COURSE  ?I reviewed the triage vital signs and the nursing notes. ? ? 26 y.o. male history of type 1 diabetes who presents for evaluation of hyperglycemia.  Patient is tachycardic, tachypneic with no fever, normal sats and lungs are clear to auscultation.  He looks dry on exam.  Abdomen soft and nontender ? ?Ddx: DKA versus pneumonia versus sepsis versus COVID versus flu versus myocarditis versus pericarditis ? ? ?Plan: EKG, troponin, chest x-ray, COVID and flu swab, CBC, CMP, VBG, beta hydroxybutyric acid, urinalysis.  Will initiate IV hydration.  Patient is complaining of generalized body pain therefore will give fentanyl and Zofran. ? ? ?MEDICATIONS GIVEN IN ED: ?Medications  ?insulin regular, human (MYXREDLIN) 100 units/ 100 mL infusion (6 Units/hr Intravenous New Bag/Given 11/30/21 0320)  ?lactated ringers infusion (has no administration in time range)  ?dextrose 5 % in lactated ringers infusion (has no administration in time range)  ?dextrose 50 % solution 0-50 mL (has no administration in time range)  ?potassium chloride 10 mEq in 100 mL IVPB (10 mEq Intravenous New Bag/Given 11/30/21 0332)  ?lactated ringers bolus 1,000 mL (0  mLs Intravenous Stopped 11/30/21 0306)  ?ondansetron [facility](ZOFRAN) injection 4 mg (4 mg Intravenous Given 11/30/21 0154)  ?fentaNYL (SUBLIMAZE) injection 50 mcg (50 mcg Intravenous Given 11/30/21 0153)  ?ipratropium-albuterol (D

## 2021-11-30 NOTE — H&P (Addendum)
?History and Physical  ? ? ?Patient: David Ramsey MRN: [id number] DOA: 11/30/2021 ? ?Date of Service: the patient was seen and examined on 11/30/2021 ? ?Patient coming from: Home ? ?Chief Complaint:  ?Chief Complaint  ?Patient presents with  ? Hyperglycemia  ? ? ?HPI:  ? ?26 year old male with past medical history of diabetes mellitus type 1 diabetic polyneuropathy, hypothyroidism, who presents to [facility] emergency department with weakness, body aches and significant hyperglycemia and blood sugar on glucometer of 600. ? ?Of note, patient was recently hospitalized at [facility] from 2/28 until 3/4.  Patient initially presented with diabetic ketoacidosis and was also found to have acute pancreatitis with multifocal pneumonia.  Patient clinically improved and was discharged home on 3/4. ? ?Patient explains that since his recent hospitalization he has continued to feel generally weak despite finishing the course of oral antibiotics after discharge.  Patient has had multiple residual symptoms since that admission . Patient complains of ongoing generalized abdominal pain and poor appetite.  He also complains of some persisting mild nonproductive cough as well as some shortness of breath with exertion.  Particularly in the past several days patient has continued to experience generalized weakness and myalgias with rising blood sugars.  Due to worsening hyperglycemia the patient presented to [facility] emergency department for evaluation. ? ?Upon evaluation in the emergency department upon evaluation in the emergency department patient was found to have a substantial lactic acidosis of 5.4.  Patient was found to be COVID-19 PCR negative with no evidence of leukocytosis. ER provider felt patient was suffering from mild diabetic ketoacidosis with elevated Beta-hydroxybutyrate of 1.29.  ER provider initiated insulin infusion.  Hospitalist group was then called to assess the patient for admission to the hospital.   ? ? ?Review of Systems:  Review of Systems  ?Constitutional:  Positive for malaise/fatigue.  ?Musculoskeletal:  Positive for myalgias.  ?Neurological:  Positive for weakness.  ? ? ?Past Medical History:  ?Diagnosis Date  ? Diabetes mellitus without complication ([place])   ? INSULIN DEPENDENT   SINCE AGE 29  ? DKA (diabetic ketoacidoses) 02/17/2017  ? Hashimoto's disease   ? MRSA infection   ? Pneumonia   ? Liver contusion, lung effusion, sepsis multiorgan failure, no clear source  ? ? ?Past Surgical History:  ?Procedure Laterality Date  ? CHEST TUBE INSERTION    ? ? ?Social History:  reports that he quit smoking about 4 years ago. His smoking use included cigarettes. He has a 2.00 pack-year smoking history. He has never used smokeless tobacco. He reports current alcohol use. He reports that he does not use drugs. ? ?Allergies  ?Allergen Reactions  ? Tape Rash  ?  Blisters- can use kerlix tape  ? ? ?Family History  ?Problem Relation Age of Onset  ? Diabetes Cousin   ? Heart disease Other   ? Cancer Other   ? Thyroid disease Other   ? Healthy Mother   ? ? ?Prior to Admission medications   ?Medication Sig Start Date End Date Taking? Authorizing Provider  ?acetaminophen (TYLENOL) 500 MG tablet Take 1,000 mg by mouth every 6 (six) hours as needed for moderate pain or headache.   Yes [provider]  ?albuterol (VENTOLIN HFA) 108 (90 Base) MCG/ACT inhaler Inhale 2 puffs into the lungs every 6 (six) hours as needed for wheezing or shortness of breath. 11/08/21  Yes Loletha Grayer, MD  ?blood glucose meter kit and supplies KIT Dispense based on patient and insurance preference. Use up to four  times daily as directed. 11/03/20  Yes Sheikh, Omair Latif, DO  ?FLUoxetine (PROZAC) 20 MG capsule Take 1 capsule (20 mg total) by mouth daily. 11/08/21 12/08/21 Yes Wieting, Richard, MD  ?gabapentin (NEURONTIN) 300 MG capsule Take 1 capsule (300 mg total) by mouth 3 (three) times daily. 11/08/21  Yes Wieting, Richard, MD  ?Ruthell Rummage KWIKPEN 100 UNIT/ML KwikPen  Inject 0-9 Units into the skin 3 (three) times daily. Sliding scale insulin Less than 70 initiate hypoglycemia protocol 70-120  0 units 120-150 1 unit 151-200 2 units 201-250 3 units 251-300 5 units 301-350 7 units 351-400 9 units Greater than 400 call MD 11/08/21  Yes Wieting, Richard, MD  ?insulin detemir (LEVEMIR) 100 UNIT/ML FlexPen Inject 24 Units into the skin daily. 11/08/21  Yes Wieting, Richard, MD  ?Insulin Pen Needle (PEN NEEDLES 3/16") 31G X 5 MM MISC 1 Container by Does not apply route 4 (four) times daily -  before meals and at bedtime. 11/03/20  Yes Sheikh, Omair Latif, DO  ?ipratropium (ATROVENT) 0.06 % nasal spray Place 2 sprays into both nostrils 2 (two) times daily. 11/08/21  Yes Wieting, Richard, MD  ?levothyroxine (SYNTHROID) 150 MCG tablet Take 1 tablet (150 mcg total) by mouth daily. 11/08/21 12/08/21 Yes Wieting, Richard, MD  ?oxyCODONE (OXY IR/ROXICODONE) 5 MG immediate release tablet Take 1 tablet (5 mg total) by mouth every 6 (six) hours as needed for severe pain. 11/08/21  Yes Wieting, Richard, MD  ?triamcinolone (KENALOG) 0.1 % Apply topically 2 (two) times daily. ?Patient not taking: Reported on 11/30/2021 11/03/20   Raiford Noble Latif, DO  ?pantoprazole (PROTONIX) 40 MG tablet Take 1 tablet (40 mg total) by mouth daily. 02/18/17 06/06/20  GhimireHenreitta Leber, MD  ? ? ?Physical Exam: ? ?Vitals:  ? 11/30/21 0500 11/30/21 0530 11/30/21 0600 11/30/21 0630  ?BP: 114/77 112/78 118/73 122/75  ?Pulse: 92 92 88 90  ?Resp: (!) 23 (!) 23 17 (!) 23  ?Temp:      ?TempSrc:      ?SpO2: 98% 97% 99% 98%  ?Weight:      ?Height:      ? ? ?Constitutional: Awake alert and oriented x3, no associated distress.   ?Skin: no rashes, no lesions, poor skin turgor noted. ?Eyes: Pupils are equally reactive to light.  No evidence of scleral icterus or conjunctival pallor.  ?ENMT: dry mucous membranes noted.  Posterior pharynx clear of any exudate or lesions.   ?Neck: normal, supple, no masses, no thyromegaly.  No evidence of jugular  venous distension.   ?Respiratory: clear to auscultation bilaterally, no wheezing, no crackles. Normal respiratory effort. No accessory muscle use.  ?Cardiovascular: Tachycardic but regular, no murmurs / rubs / gallops. No extremity edema. 2+ pedal pulses. No carotid bruits.  ?Chest:   Nontender without crepitus or deformity.   ?Back:   Nontender without crepitus or deformity. ?Abdomen: Generalized abdominal tenderness, worst in the epigastric region .  No evidence of intra-abdominal masses.  Positive bowel sounds noted in all quadrants.   ?Musculoskeletal:  Notable tenderness of the distal bilateral lower extremities without deformity . No joint deformity upper and lower extremities. Good ROM, no contractures. Normal muscle tone.  ?Neurologic: CN 2-12 grossly intact. Sensation intact.  Patient moving all 4 extremities spontaneously.  Patient is following all commands.  Patient is responsive to verbal stimuli.   ?Psychiatric: Patient exhibits normal mood with appropriate affect.  Patient seems to possess insight as to their current situation.   ? ?Data Reviewed: ? ?I have  personally reviewed and interpreted labs, imaging. ? ?Significant findings are white blood cell count of 6.4, COVID-19 PCR negative.  Lactic acid of 5.4.  VBG revealing pH of 7.4 with PCO2 42. ? ?Chest x-ray personally reviewed revealing no evidence of acute cardiopulmonary disease. ? ?EKG: Personally reviewed.  Rhythm is sinus tachycardia with heart rate of 104 bpm.  No dynamic ST segment changes appreciated. ? ? ?Assessment and Plan: ?* Uncontrolled type 1 diabetes mellitus with hyperglycemia, with long-term current use of insulin ([place]) ?Patient presenting with severe hyperglycemia, elevated anion gap on chemistry and elevated beta hydroxybutyrate ?Actual bicarbonate levels on chemistry are normal however which makes diabetic ketoacidosis unlikely ?Furthermore, the widened anion gap is likely secondary to the concurrent lactic acidosis and not  due to diabetic ketoacidosis ?Regardless, patient is presenting with severe hyperglycemia.  We will continue the insulin infusion initiated by the ER provider temporarily before quickly transitioning to bas

## 2021-11-30 NOTE — Assessment & Plan Note (Addendum)
?   Likely secondary to volume depletion ?? Hydrating patient aggressively with intravenous isotonic fluids ?? Resolved with hydration ?

## 2021-11-30 NOTE — Progress Notes (Signed)
?  Progress Note ? ? ?Patient: David Ramsey [id number] DOB: 1996/06/18 DOA: 11/30/2021     0 ?DOS: the patient was seen and examined on 11/30/2021 ?  ?Brief hospital course: ?No notes on file ? ?Assessment and Plan: ?* Uncontrolled type 1 diabetes mellitus with hyperglycemia, with long-term current use of insulin ([place]) ?Patient presenting with severe hyperglycemia, elevated anion gap on chemistry and elevated beta hydroxybutyrate ?Insulin gtt was stopped this AM, was not given basal dose ?Subsequent glucose trended up to 500's with rising hydroxybutyric acid up to 2.0 ?Continued aggressive IVF hydration. Chart reviewed. At last recent admit, pt was continued on 24 units long-acting insulin with 6 units meal coverage and SSI. Would resume when tolerating PO ?Pt has received 24 units long-acting coverage this AM after insulin gtt was stopped ? ? ?Lactic acidosis ?Likely secondary to volume depletion ?Hydrating patient aggressively with intravenous isotonic fluids ?Improved with IVF hydration ? ?Abdominal pain, generalized ?Patient complains of ongoing generalized abdominal pain that essentially has never resolved with recent hospitalization for acute pancreatitis and pneumonia ?Continues with generalized abd pain, more so over epigastric region ?CT reviewed, findings of chronic pancreatitis with circumferential thickening of bladder. However, UA is neg and pt has no leukocytosis and is afebrile ? ?Abnormal LFTs ?Please see assessment above. ? ?Hypothyroidism ?Resume home regimen of Synthroid ? ? ? ?Diabetic polyneuropathy associated with type 1 diabetes mellitus ([place]) ?Patient complaining of severe pain related to his neuropathy ?Continue Neurontin .  ? ? ? ? ?  ? ?Subjective: Complaining of abd pains ? ?Physical Exam: ?Vitals:  ? 11/30/21 1135 11/30/21 1455 11/30/21 1524 11/30/21 1615  ?BP:    108/60  ?Pulse:    99  ?Resp: 18 18 17 16   ?Temp:    98.1 ?F (36.7 ?C)  ?TempSrc:      ?SpO2:    98%  ?Weight:       ?Height:      ? ?General exam: Awake, laying in bed, in nad ?Respiratory system: Normal respiratory effort, no wheezing ?Cardiovascular system: regular rate, s1, s2 ?Gastrointestinal system: Soft, nondistended, positive BS ?Central nervous system: CN2-12 grossly intact, strength intact ?Extremities: Perfused, no clubbing ?Skin: Normal skin turgor, no notable skin lesions seen ?Psychiatry: Mood normal // no visual hallucinations  ? ?Data Reviewed: ? ?Labs reviewed: K of >7.5, glucose up to 524, Na 128, beta-hydroxybutyric acid 2.0 ? ?Family Communication: Pt in room ? ?Disposition: ?Status is: Observation ?The patient remains OBS appropriate and will d/c before 2 midnights. ? Planned Discharge Destination: Home ? ? ? ?Author: ?Marylu Lund, MD ?11/30/2021 4:55 PM ? ?For on call review [email or web address].  ?

## 2021-11-30 NOTE — Assessment & Plan Note (Addendum)
?   Patient presenting with severe hyperglycemia, elevated anion gap on chemistry and elevated beta hydroxybutyrate ?? Insulin gtt was stopped this AM, was not given basal dose ?? Subsequent glucose trended up to 500's with rising hydroxybutyric acid up to 2.0 ?? Continued aggressive IVF hydration. Chart reviewed. At last recent admit, pt was continued on 24 units long-acting insulin with 6 units meal coverage and SSI. Had resumed with much improved glycemic control ?? Have provided refill of insulin to pharmacy ? ?

## 2021-11-30 NOTE — ED Notes (Signed)
CBG=[id number] 

## 2021-11-30 NOTE — ED Notes (Signed)
Admitting provider at bedside to evaluate pt. Verbal given to stop Endotool based on last BGL. ?

## 2021-11-30 NOTE — Assessment & Plan Note (Signed)
.   Resume home regimen of Synthroid 

## 2021-11-30 NOTE — Plan of Care (Signed)

## 2021-11-30 NOTE — Assessment & Plan Note (Addendum)
Please see assessment above. ?CT abd reviewed. Liver and GB appear normal ?

## 2021-11-30 NOTE — ED Notes (Signed)
Patient c/o pain in his legs. Notified provider. He states he feels some better after his breathing treatment. ?

## 2021-11-30 NOTE — ED Triage Notes (Signed)
Pt comes from home by POV. Pt states he was eating and after eating blood sugar spiked up to 600. Pt states he felt like he couldn't move. Pt complaining of pain in shoulders, left leg, and hands. Pt has hx of pneumonia, pleurisy, diabetes. Pt has infected ulcer on left hand. ?

## 2021-12-01 DIAGNOSIS — E1042 Type 1 diabetes mellitus with diabetic polyneuropathy: Secondary | ICD-10-CM | POA: Diagnosis not present

## 2021-12-01 DIAGNOSIS — R7989 Other specified abnormal findings of blood chemistry: Secondary | ICD-10-CM | POA: Diagnosis not present

## 2021-12-01 DIAGNOSIS — E1065 Type 1 diabetes mellitus with hyperglycemia: Secondary | ICD-10-CM | POA: Diagnosis not present

## 2021-12-01 LAB — COMPREHENSIVE METABOLIC PANEL
ALT: 106 U/L — ABNORMAL HIGH (ref 0–44)
AST: 151 U/L — ABNORMAL HIGH (ref 15–41)
Albumin: 3.4 g/dL — ABNORMAL LOW (ref 3.5–5.0)
Alkaline Phosphatase: 70 U/L (ref 38–126)
Anion gap: 8 (ref 5–15)
BUN: 12 mg/dL (ref 6–20)
CO2: 30 mmol/L (ref 22–32)
Calcium: 8.9 mg/dL (ref 8.9–10.3)
Chloride: 104 mmol/L (ref 98–111)
Creatinine, Ser: 1.04 mg/dL (ref 0.61–1.24)
GFR, Estimated: >60 mL/min (ref >60)
Glucose, Bld: 75 mg/dL (ref 70–99)
Potassium: 4 mmol/L (ref 3.5–5.1)
Sodium: 142 mmol/L (ref 135–145)
Total Bilirubin: 1.1 mg/dL (ref 0.3–1.2)
Total Protein: 6 g/dL — ABNORMAL LOW (ref 6.5–8.1)

## 2021-12-01 LAB — GLUCOSE, CAPILLARY
Glucose-Capillary: 126 mg/dL — ABNORMAL HIGH (ref 70–99)
Glucose-Capillary: 63 mg/dL — ABNORMAL LOW (ref 70–99)
Glucose-Capillary: 167 mg/dL — ABNORMAL HIGH (ref 70–99)
Glucose-Capillary: 138 mg/dL — ABNORMAL HIGH (ref 70–99)
Glucose-Capillary: 153 mg/dL — ABNORMAL HIGH (ref 70–99)
Glucose-Capillary: 54 mg/dL — ABNORMAL LOW (ref 70–99)
Glucose-Capillary: 77 mg/dL (ref 70–99)
Glucose-Capillary: 143 mg/dL — ABNORMAL HIGH (ref 70–99)

## 2021-12-01 LAB — MAGNESIUM: Magnesium: 1.9 mg/dL (ref 1.7–2.4)

## 2021-12-01 LAB — PROCALCITONIN: Procalcitonin: <0.1 ng/mL

## 2021-12-01 MED ORDER — INSULIN DETEMIR 100 UNIT/ML FLEXPEN: 24.0000 [IU] | PEN_INJECTOR | Freq: Every day | SUBCUTANEOUS | 0 refills | Status: DC

## 2021-12-01 MED ORDER — HUMALOG KWIKPEN 100 UNIT/ML ~~LOC~~ SOPN: 0.0000 [IU] | PEN_INJECTOR | Freq: Three times a day (TID) | SUBCUTANEOUS | 0 refills | Status: DC

## 2021-12-01 MED ORDER — MORPHINE SULFATE (PF) 2 MG/ML IV SOLN: 1.0000 mg | INTRAVENOUS | Status: DC | PRN

## 2021-12-01 MED ORDER — OXYCODONE HCL 5 MG PO TABS: 5.0000 mg | ORAL_TABLET | Freq: Four times a day (QID) | ORAL | 0 refills | Status: DC | PRN

## 2021-12-01 MED ORDER — OXYCODONE-ACETAMINOPHEN 5-325 MG PO TABS
1.0000 | ORAL_TABLET | Freq: Four times a day (QID) | ORAL | Status: DC | PRN
  Administered 2021-12-01 (×2): 1 via ORAL
  Filled 2021-12-01 (×2): qty 1

## 2021-12-01 MED ORDER — "PEN NEEDLES 3/16"" 31G X 5 MM MISC": 1.0000 | Freq: Three times a day (TID) | 0 refills | Status: DC

## 2021-12-01 MED ORDER — INSULIN LISPRO (1 UNIT DIAL) 100 UNIT/ML (KWIKPEN)
6.0000 [IU] | PEN_INJECTOR | Freq: Three times a day (TID) | SUBCUTANEOUS | 0 refills | Status: DC
Start: 2021-12-01 — End: 2022-01-22

## 2021-12-01 NOTE — TOC Initial Note (Signed)
Transition of Care (TOC) - Initial/Assessment Note  ? ? ?Patient Details  ?Name: ELSTON PASSERO ?MRN: [id number] ?Date of Birth: Mar 06, 1996 ? ?Transition of Care (TOC) CM/SW Contact:    ?Conception Oms, RN ?Phone Number: ?12/01/2021, 1:11 PM ? ?Clinical Narrative:                 ? ?Transition of Care (TOC) Screening Note ? ? ?Patient Details  ?Name: TRAVAS ORNDOFF ?Date of Birth: May 11, 1996 ? ? ?Transition of Care (TOC) CM/SW Contact:    ?Conception Oms, RN ?Phone Number: ?12/01/2021, 1:11 PM ? ? ? ?Transition of Care Department [facility]) has reviewed patient and no TOC needs have been identified at this time. We will continue to monitor patient advancement through interdisciplinary progression rounds. If new patient transition needs arise, please place a TOC consult. ?  ? ?  ?  ? ? ?Patient Goals and CMS Choice ?  ?  ?  ? ?Expected Discharge Plan and Services ?  ?  ?  ?  ?  ?                ?  ?  ?  ?  ?  ?  ?  ?  ?  ?  ? ?Prior Living Arrangements/Services ?  ?  ?  ?       ?  ?  ?  ?  ? ?Activities of Daily Living ?  ?  ? ?Permission Sought/Granted ?  ?  ?   ?   ?   ?   ? ?Emotional Assessment ?  ?  ?  ?  ?  ?  ? ?Admission diagnosis:  DKA (diabetic ketoacidosis) ([place]) [E11.10] ?Type 1 diabetes mellitus with ketoacidosis without coma ([place]) [E10.10] ?Uncontrolled type 1 diabetes mellitus with hyperglycemia, with long-term current use of insulin ([place]) [E10.65] ?Patient Active Problem List  ? Diagnosis Date Noted  ? Lactic acidosis 11/30/2021  ? Abnormal LFTs 11/30/2021  ? Abdominal pain, generalized 11/30/2021  ? Diabetic polyneuropathy associated with type 1 diabetes mellitus ([place]) 11/30/2021  ? Hyperkalemia 11/30/2021  ? Elevated d-dimer 11/07/2021  ? Hypokalemia 11/06/2021  ? Diabetic neuropathy ([place]) 11/06/2021  ? Diarrhea 10/25/2021  ? Leukocytosis 10/28/2020  ? Rash 10/28/2020  ? Uncontrolled type 1 diabetes mellitus with hyperglycemia, with long-term current use of insulin ([place]) 02/17/2017  ? Nausea &  vomiting 02/17/2017  ? DKA (diabetic ketoacidoses) 12/16/2016  ? Cellulitis 12/16/2016  ? AKI (acute kidney injury) ([place]) 12/16/2016  ? DKA, type 1, not at goal [facility]) 12/16/2016  ? Hypothyroidism 11/01/2014  ? Type 1 diabetes mellitus with hyperglycemia ([place]) 08/15/2001  ? ?PCP:  Patient, No Pcp Per (Inactive) ?Pharmacy:   ?[id number] - [place], [place] - [address] ?[place] ?[place] [address] ?Phone: [phone] Fax: [phone] ? ?[id number] [address], [place] [address] ?Phone: [phone] Fax: [phone] ? ? ? ? ?Social Determinants of Health (SDOH) Interventions ?  ? ?Readmission Risk Interventions ?   ? View : No data to display.  ?  ?  ?  ? ? ? ?

## 2021-12-01 NOTE — Progress Notes (Signed)
Inpatient Diabetes Program Recommendations ? ?AACE/ADA: New Consensus Statement on Inpatient Glycemic Control  ? ?Target Ranges:  Prepandial:   less than 140 mg/dL ?     Peak postprandial:   less than 180 mg/dL (1-2 hours) ?     Critically ill patients:  140 - 180 mg/dL  ? ? Latest Reference Range & Units 12/01/21 04:46 12/01/21 05:22 12/01/21 06:08 12/01/21 08:20  ?Glucose-Capillary 70 - 99 mg/dL 63 (L) 578 (H) 469 (H) 138 (H)  ? ? Latest Reference Range & Units 11/30/21 07:36 11/30/21 11:40 11/30/21 12:59 11/30/21 14:04 11/30/21 16:11 11/30/21 21:41  ?Glucose-Capillary 70 - 99 mg/dL 629 (H) 528 (HH) 413 (H) 284 (H) 161 (H) 78  ? ?Review of Glycemic Control ? ?Diabetes history:  DM1 (makes NO insulin; requires basal, correction, and carbohydrate coverage insulin) ?Outpatient Diabetes medications: Levemir 24 units daily, Humalog Humalog 1 unit for every 8 grams of carbs, Humalog 1 unit for every 20 mg/dl above target of 244 mg/dl ?Current orders for Inpatient glycemic control: Levemir 24 units daily, Novolog 6 units TID with meals, Novolog 0-9 units TID with meals, Novolog 0-5 units QHS ? ?NOTE: Patient admitted with hyperglycemia, lactic acidosis, and abdominal pain. Patient was recently admitted 11/04/21-11/08/21 and was seen by inpatient diabetes coordinator on 11/05/21 during that admission. Agree with current insulin orders; will follow along while inpatient. ? ?Thanks, ?Orlando Penner, RN, MSN, CDE ?Diabetes Coordinator ?Inpatient Diabetes Program ?[phone] (Team Pager from 8am to 5pm) ? ? ? ?

## 2021-12-01 NOTE — Hospital Course (Signed)
26 year old male with past medical history of diabetes mellitus type 1 diabetic polyneuropathy, hypothyroidism, who presents to [facility] emergency department with weakness, body aches and significant hyperglycemia and blood sugar on glucometer of 600. ?  ?Of note, patient was recently hospitalized at [facility] from 2/28 until 3/4.  Patient initially presented with diabetic ketoacidosis and was also found to have acute pancreatitis with multifocal pneumonia.  Patient clinically improved and was discharged home on 3/4. ?  ?Patient explains that since his recent hospitalization he has continued to feel generally weak despite finishing the course of oral antibiotics after discharge.  Patient has had multiple residual symptoms since that admission . Patient complains of ongoing generalized abdominal pain and poor appetite.  He also complains of some persisting mild nonproductive cough as well as some shortness of breath with exertion.  Particularly in the past several days patient has continued to experience generalized weakness and myalgias with rising blood sugars.  Due to worsening hyperglycemia the patient presented to [facility] emergency department for evaluation. ?

## 2021-12-01 NOTE — Discharge Summary (Signed)
?Physician Discharge Summary ?  ?Patient: David Ramsey MRN: [id number] DOB: [birth year]  ?Admit date:     11/30/2021  ?Discharge date: 12/01/21  ?Discharge Physician: Marylu Lund  ? ?PCP: Patient, No Pcp Per (Inactive)  ? ?Recommendations at discharge:  ? ? Follow up with PCP as scheduled ?[place] reviewed. Only limited quantity of controlled substance was prescribed earlier this month. Will prescribe very limited quantity of narcotic for acute pain ? ?Discharge Diagnoses: ?Principal Problem: ?  Uncontrolled type 1 diabetes mellitus with hyperglycemia, with long-term current use of insulin ([place]) ?Active Problems: ?  Lactic acidosis ?  Abdominal pain, generalized ?  Abnormal LFTs ?  Hypothyroidism ?  Diabetic polyneuropathy associated with type 1 diabetes mellitus ([place]) ?  Hyperkalemia ? ?Resolved Problems: ?  * No resolved hospital problems. * ? ?Hospital Course: ?26 year old male with past medical history of diabetes mellitus type 1 diabetic polyneuropathy, hypothyroidism, who presents to [facility] emergency department with weakness, body aches and significant hyperglycemia and blood sugar on glucometer of 600. ?  ?Of note, patient was recently hospitalized at [facility] from 2/28 until 3/4.  Patient initially presented with diabetic ketoacidosis and was also found to have acute pancreatitis with multifocal pneumonia.  Patient clinically improved and was discharged home on 3/4. ?  ?Patient explains that since his recent hospitalization he has continued to feel generally weak despite finishing the course of oral antibiotics after discharge.  Patient has had multiple residual symptoms since that admission . Patient complains of ongoing generalized abdominal pain and poor appetite.  He also complains of some persisting mild nonproductive cough as well as some shortness of breath with exertion.  Particularly in the past several days patient has continued to experience generalized weakness and myalgias with rising blood sugars.   Due to worsening hyperglycemia the patient presented to [facility] emergency department for evaluation. ? ?Assessment and Plan: ?* Uncontrolled type 1 diabetes mellitus with hyperglycemia, with long-term current use of insulin ([place]) ?Patient presenting with severe hyperglycemia, elevated anion gap on chemistry and elevated beta hydroxybutyrate ?Insulin gtt was stopped this AM, was not given basal dose ?Subsequent glucose trended up to 500's with rising hydroxybutyric acid up to 2.0 ?Continued aggressive IVF hydration. Chart reviewed. At last recent admit, pt was continued on 24 units long-acting insulin with 6 units meal coverage and SSI. Had resumed with much improved glycemic control ?Have provided refill of insulin to pharmacy ? ? ?Lactic acidosis ?Likely secondary to volume depletion ?Hydrating patient aggressively with intravenous isotonic fluids ?Resolved with hydration ? ?Abdominal pain, generalized ?Patient complains of ongoing generalized abdominal pain that essentially has never resolved with recent hospitalization for acute pancreatitis and pneumonia ?Continues with generalized abd pain, more so over epigastric region ?CT reviewed, findings of chronic pancreatitis with circumferential thickening of bladder. However, UA is neg and pt has no leukocytosis and is afebrile ?Improved with analgesia ? ?Abnormal LFTs ?Please see assessment above. ?CT abd reviewed. Liver and GB appear normal ? ?Hypothyroidism ?Resume home regimen of Synthroid ? ? ? ?Diabetic polyneuropathy associated with type 1 diabetes mellitus ([place]) ?Patient complaining of severe pain related to his neuropathy ?Continue Neurontin .  ? ?Hyperkalemia ?-K of >7.5 this visit, confirmed on repeat BMET ?-Pt given amp of bicarb, IV Ca, lokelma ?-Repeat K normalized  ? ? ? ? ?  ? ? ?Consultants:  ?Procedures performed:   ?Disposition: Home ?Diet recommendation:  ?Carb modified diet ?DISCHARGE MEDICATION: ?Allergies as of 12/01/2021   ? ?   Reactions  ?  Tape  Rash  ? Blisters- can use kerlix tape  ? ?  ? ?  ?Medication List  ?  ? ?STOP taking these medications   ? ?triamcinolone cream 0.1 % ?Commonly known as: KENALOG ?  ? ?  ? ?TAKE these medications   ? ?acetaminophen 500 MG tablet ?Commonly known as: TYLENOL ?Take 1,000 mg by mouth every 6 (six) hours as needed for moderate pain or headache. ?  ?albuterol 108 (90 Base) MCG/ACT inhaler ?Commonly known as: VENTOLIN HFA ?Inhale 2 puffs into the lungs every 6 (six) hours as needed for wheezing or shortness of breath. ?  ?blood glucose meter kit and supplies Kit ?Dispense based on patient and insurance preference. Use up to four times daily as directed. ?  ?FLUoxetine 20 MG capsule ?Commonly known as: PROZAC ?Take 1 capsule (20 mg total) by mouth daily. ?  ?gabapentin 300 MG capsule ?Commonly known as: NEURONTIN ?Take 1 capsule (300 mg total) by mouth 3 (three) times daily. ?  ?HumaLOG KwikPen 100 UNIT/ML KwikPen ?Generic drug: insulin lispro ?Inject 0-9 Units into the skin 3 (three) times daily. Sliding scale insulin Less than 70 initiate hypoglycemia protocol 70-120  0 units 120-150 1 unit 151-200 2 units 201-250 3 units 251-300 5 units 301-350 7 units 351-400 9 units Greater than 400 call MD ?What changed: Another medication with the same name was added. Make sure you understand how and when to take each. ?  ?insulin lispro 100 UNIT/ML KwikPen ?Commonly known as: HUMALOG ?Inject 6 Units into the skin 3 (three) times daily. ?What changed: You were already taking a medication with the same name, and this prescription was added. Make sure you understand how and when to take each. ?  ?insulin detemir 100 UNIT/ML FlexPen ?Commonly known as: LEVEMIR ?Inject 24 Units into the skin daily. ?  ?ipratropium 0.06 % nasal spray ?Commonly known as: ATROVENT ?Place 2 sprays into both nostrils 2 (two) times daily. ?  ?levothyroxine 150 MCG tablet ?Commonly known as: SYNTHROID ?Take 1 tablet (150 mcg total) by mouth daily. ?  ?oxyCODONE  5 MG immediate release tablet ?Commonly known as: Oxy IR/ROXICODONE ?Take 1 tablet (5 mg total) by mouth every 6 (six) hours as needed for severe pain. ?  ?Pen Needles 3/16" 31G X 5 MM Misc ?1 Container by Does not apply route 4 (four) times daily -  before meals and at bedtime. ?  ? ?  ? ? Follow-up Information   ? ? Follow up with PCP Follow up.   ?Why: Hospital follow up, as scheduled ? ?  ?  ? ?  ?  ? ?  ? ?Discharge Exam: ?Filed Weights  ? 11/30/21 0142  ?Weight: 72.6 kg  ? ?General exam: Awake, laying in bed, in nad ?Respiratory system: Normal respiratory effort, no wheezing ?Cardiovascular system: regular rate, s1, s2 ?Gastrointestinal system: Soft, nondistended, positive BS ?Central nervous system: CN2-12 grossly intact, strength intact ?Extremities: Perfused, no clubbing ?Skin: Normal skin turgor, no notable skin lesions seen ?Psychiatry: Mood normal // no visual hallucinations  ? ?Condition at discharge: good ? ?The results of significant diagnostics from this hospitalization (including imaging, microbiology, ancillary and laboratory) are listed below for reference.  ? ?Imaging Studies: ?CT Angio Chest Pulmonary Embolism (PE) W or WO Contrast ? ?Result Date: 11/06/2021 ?CLINICAL DATA:  Chest pain EXAM: CT ANGIOGRAPHY CHEST WITH CONTRAST TECHNIQUE: Multidetector CT imaging of the chest was performed using the standard protocol during bolus administration of intravenous contrast. Multiplanar CT image reconstructions  and MIPs were obtained to evaluate the vascular anatomy. RADIATION DOSE REDUCTION: This exam was performed according to the departmental dose-optimization program which includes automated exposure control, adjustment of the mA and/or kV according to patient size and/or use of iterative reconstruction technique. CONTRAST:  58m OMNIPAQUE IOHEXOL 350 MG/ML SOLN COMPARISON:  Chest radiographs done on 11/04/2021 FINDINGS: Cardiovascular: There is homogeneous enhancement in thoracic aorta. There are no  intraluminal filling defects in the pulmonary artery branches. Evaluation of small peripheral branches in the lower lung fields is limited by infiltrates and motion. Mediastinum/Nodes: There are slightly

## 2021-12-01 NOTE — Progress Notes (Signed)
Hypoglycemic Event ? ?CBG: 63 ? ?Treatment:2 apple juice  ? ?Symptoms: asymptomatic ? ?Follow-up CBG: Time 0516 CBG Result:126 ?Follow-up CBG:  Time 0608 CBG Result 167 ? ?Possible Reasons for Event: type 1 diabetic on clear liquid diet ? ?Comments/MD notified:Katie Foust notified ?Followed hypoglycemic protocol ?Charge nurse notified ? ? ? ?David Ramsey ? ? ?

## 2021-12-05 LAB — CULTURE, BLOOD (ROUTINE X 2)
Culture: NO GROWTH
Culture: NO GROWTH

## 2021-12-17 ENCOUNTER — Telehealth: Payer: BC Managed Care – PPO | Admitting: Physician Assistant

## 2021-12-17 DIAGNOSIS — E1069 Type 1 diabetes mellitus with other specified complication: Secondary | ICD-10-CM

## 2021-12-17 DIAGNOSIS — N390 Urinary tract infection, site not specified: Secondary | ICD-10-CM

## 2021-12-17 NOTE — Progress Notes (Signed)
E-Visit for Urinary Problems ? ?Based on what you shared with me, I feel your condition warrants further evaluation and I recommend that you be seen for a face to face office visit.  Male bladder infections are not very common.  We worry about prostate or kidney conditions.  The standard of care is to examine the abdomen and kidneys, and to do a urine and blood test to make sure that something more serious is not going on.  We recommend that you see a provider today.  If your doctor's office is closed [facility] has the following Urgent Cares: ? ?  ?NOTE: You will not be charged for this e-visit. ? ?If you are having a true medical emergency please call 911.   ? ?  ? For an urgent face to face visit, [facility] has six urgent care centers for your convenience:  ?  ? [facility] [place] ?Get Driving Directions ?[phone] ?[address] ?[place], [place] [address] ?  ? [facility] ([place]) ?Get Driving Directions ?[phone] ?[address] ?[place], [place] [address] ? ?[facility] ([place] [facility] [place]) ?Get Driving Directions ?[phone] ?[address] ?[place],  [place]  [address] ? ?[facility] ?Get Driving Directions ?[phone] ?[address] ?[place], [place] [address] ?  ? [facility] at [facility] [place] ?Get Driving Directions  ?[phone] ?[address] ?[address] ?[place], [place] [address] ?  ? [facility]gent [facility] at [place] ?Get Driving Directions ?[phone] ?[address] ?[place], [place] [address] ? ?Your MyChart E-visit questionnaire answers were reviewed by a board certified advanced clinical practitioner to complete your personal care plan based on your specific symptoms.  Thank you for using e-Visits. ?  ? ?I provided 5 minutes of non face-to-face time during this encounter for chart review and documentation.  ? ?

## 2021-12-23 ENCOUNTER — Ambulatory Visit: Payer: Self-pay | Admitting: Family Medicine

## 2021-12-31 ENCOUNTER — Ambulatory Visit: Payer: Self-pay | Admitting: Family Medicine

## 2022-01-20 ENCOUNTER — Inpatient Hospital Stay
Admission: EM | Admit: 2022-01-20 | Discharge: 2022-01-22 | DRG: 438 | Disposition: A | Discharge status: Home / Self Care | Payer: BC Managed Care – PPO | Attending: Internal Medicine | Admitting: Internal Medicine

## 2022-01-20 ENCOUNTER — Emergency Department: Payer: BC Managed Care – PPO

## 2022-01-20 ENCOUNTER — Other Ambulatory Visit: Payer: Self-pay

## 2022-01-20 ENCOUNTER — Inpatient Hospital Stay: Payer: BC Managed Care – PPO

## 2022-01-20 DIAGNOSIS — Z79899 Other long term (current) drug therapy: Secondary | ICD-10-CM | POA: Diagnosis not present

## 2022-01-20 DIAGNOSIS — R112 Nausea with vomiting, unspecified: Secondary | ICD-10-CM | POA: Diagnosis present

## 2022-01-20 DIAGNOSIS — E1010 Type 1 diabetes mellitus with ketoacidosis without coma: Secondary | ICD-10-CM | POA: Diagnosis not present

## 2022-01-20 DIAGNOSIS — E1110 Type 2 diabetes mellitus with ketoacidosis without coma: Secondary | ICD-10-CM | POA: Diagnosis not present

## 2022-01-20 DIAGNOSIS — R000 Tachycardia, unspecified: Secondary | ICD-10-CM | POA: Diagnosis not present

## 2022-01-20 DIAGNOSIS — E039 Hypothyroidism, unspecified: Secondary | ICD-10-CM | POA: Diagnosis not present

## 2022-01-20 DIAGNOSIS — E063 Autoimmune thyroiditis: Secondary | ICD-10-CM | POA: Diagnosis not present

## 2022-01-20 DIAGNOSIS — R1084 Generalized abdominal pain: Secondary | ICD-10-CM | POA: Diagnosis present

## 2022-01-20 DIAGNOSIS — K861 Other chronic pancreatitis: Secondary | ICD-10-CM | POA: Diagnosis not present

## 2022-01-20 DIAGNOSIS — Z91048 Other nonmedicinal substance allergy status: Secondary | ICD-10-CM

## 2022-01-20 DIAGNOSIS — N390 Urinary tract infection, site not specified: Secondary | ICD-10-CM | POA: Diagnosis present

## 2022-01-20 DIAGNOSIS — Z794 Long term (current) use of insulin: Secondary | ICD-10-CM

## 2022-01-20 DIAGNOSIS — K858 Other acute pancreatitis without necrosis or infection: Principal | ICD-10-CM | POA: Diagnosis present

## 2022-01-20 DIAGNOSIS — Z79890 Hormone replacement therapy (postmenopausal): Secondary | ICD-10-CM | POA: Diagnosis not present

## 2022-01-20 DIAGNOSIS — E1165 Type 2 diabetes mellitus with hyperglycemia: Secondary | ICD-10-CM | POA: Diagnosis not present

## 2022-01-20 DIAGNOSIS — E1042 Type 1 diabetes mellitus with diabetic polyneuropathy: Secondary | ICD-10-CM | POA: Diagnosis not present

## 2022-01-20 DIAGNOSIS — G8929 Other chronic pain: Secondary | ICD-10-CM | POA: Diagnosis present

## 2022-01-20 DIAGNOSIS — K859 Acute pancreatitis without necrosis or infection, unspecified: Secondary | ICD-10-CM | POA: Diagnosis not present

## 2022-01-20 DIAGNOSIS — E876 Hypokalemia: Secondary | ICD-10-CM | POA: Diagnosis not present

## 2022-01-20 DIAGNOSIS — Z87891 Personal history of nicotine dependence: Secondary | ICD-10-CM

## 2022-01-20 DIAGNOSIS — R109 Unspecified abdominal pain: Secondary | ICD-10-CM | POA: Diagnosis not present

## 2022-01-20 DIAGNOSIS — R188 Other ascites: Secondary | ICD-10-CM | POA: Diagnosis not present

## 2022-01-20 DIAGNOSIS — E1065 Type 1 diabetes mellitus with hyperglycemia: Secondary | ICD-10-CM | POA: Diagnosis present

## 2022-01-20 LAB — URINALYSIS, ROUTINE W REFLEX MICROSCOPIC
Bacteria, UA: NONE SEEN
Bilirubin Urine: NEGATIVE
Glucose, UA: >=500 mg/dL — AB
Hgb urine dipstick: NEGATIVE
Ketones, ur: 20 mg/dL — AB
Leukocytes,Ua: NEGATIVE
Nitrite: NEGATIVE
Protein, ur: NEGATIVE mg/dL
Specific Gravity, Urine: 1.021 (ref 1.005–1.030)
Squamous Epithelial / HPF: NONE SEEN (ref 0–5)
WBC, UA: NONE SEEN WBC/hpf (ref 0–5)
pH: 6 (ref 5.0–8.0)

## 2022-01-20 LAB — BASIC METABOLIC PANEL
Anion gap: 17 — ABNORMAL HIGH (ref 5–15)
Anion gap: 14 (ref 5–15)
BUN: 14 mg/dL (ref 6–20)
BUN: 15 mg/dL (ref 6–20)
CO2: 22 mmol/L (ref 22–32)
CO2: 21 mmol/L — ABNORMAL LOW (ref 22–32)
Calcium: 9.3 mg/dL (ref 8.9–10.3)
Calcium: 8.5 mg/dL — ABNORMAL LOW (ref 8.9–10.3)
Chloride: 91 mmol/L — ABNORMAL LOW (ref 98–111)
Chloride: 96 mmol/L — ABNORMAL LOW (ref 98–111)
Creatinine, Ser: 1.11 mg/dL (ref 0.61–1.24)
Creatinine, Ser: 1.09 mg/dL (ref 0.61–1.24)
GFR, Estimated: >60 mL/min (ref >60)
GFR, Estimated: >60 mL/min (ref >60)
Glucose, Bld: 618 mg/dL (ref 70–99)
Glucose, Bld: 485 mg/dL — ABNORMAL HIGH (ref 70–99)
Potassium: 4.6 mmol/L (ref 3.5–5.1)
Potassium: 4.2 mmol/L (ref 3.5–5.1)
Sodium: 130 mmol/L — ABNORMAL LOW (ref 135–145)
Sodium: 131 mmol/L — ABNORMAL LOW (ref 135–145)

## 2022-01-20 LAB — CBC
HCT: 44.8 % (ref 39.0–52.0)
Hemoglobin: 15.3 g/dL (ref 13.0–17.0)
MCH: 30.6 pg (ref 26.0–34.0)
MCHC: 34.2 g/dL (ref 30.0–36.0)
MCV: 89.6 fL (ref 80.0–100.0)
Platelets: 414 10*3/uL — ABNORMAL HIGH (ref 150–400)
RBC: 5 MIL/uL (ref 4.22–5.81)
RDW: 12 % (ref 11.5–15.5)
WBC: 9 10*3/uL (ref 4.0–10.5)
nRBC: 0 % (ref 0.0–0.2)

## 2022-01-20 LAB — LIPASE, BLOOD: Lipase: 2221 U/L — ABNORMAL HIGH (ref 11–51)

## 2022-01-20 LAB — BLOOD GAS, VENOUS
Acid-base deficit: 3.2 mmol/L — ABNORMAL HIGH (ref 0.0–2.0)
Bicarbonate: 22.7 mmol/L (ref 20.0–28.0)
O2 Saturation: 64.5 %
Patient temperature: 37
pCO2, Ven: 43 mmHg — ABNORMAL LOW (ref 44–60)
pH, Ven: 7.33 (ref 7.25–7.43)
pO2, Ven: 40 mmHg (ref 32–45)

## 2022-01-20 LAB — TROPONIN I (HIGH SENSITIVITY): Troponin I (High Sensitivity): 3 ng/L (ref <18)

## 2022-01-20 LAB — CBG MONITORING, ED
Glucose-Capillary: >600 mg/dL (ref 70–99)
Glucose-Capillary: 538 mg/dL (ref 70–99)

## 2022-01-20 LAB — BETA-HYDROXYBUTYRIC ACID: Beta-Hydroxybutyric Acid: 3.9 mmol/L — ABNORMAL HIGH (ref 0.05–0.27)

## 2022-01-20 MED ORDER — INSULIN REGULAR(HUMAN) IN NACL 100-0.9 UT/100ML-% IV SOLN
INTRAVENOUS | Status: DC
  Administered 2022-01-21: 10.5 [IU]/h via INTRAVENOUS
  Filled 2022-01-20: qty 100

## 2022-01-20 MED ORDER — DEXTROSE IN LACTATED RINGERS 5 % IV SOLN: INTRAVENOUS | Status: DC

## 2022-01-20 MED ORDER — INSULIN ASPART 100 UNIT/ML IJ SOLN
15.0000 [IU] | Freq: Once | INTRAMUSCULAR | Status: AC
  Administered 2022-01-20: 15 [IU] via INTRAVENOUS
  Filled 2022-01-20: qty 1

## 2022-01-20 MED ORDER — LACTATED RINGERS IV BOLUS
1000.0000 mL | Freq: Once | INTRAVENOUS | Status: AC
Start: 2022-01-20 — End: 2022-01-20
  Administered 2022-01-20: 1000 mL via INTRAVENOUS

## 2022-01-20 MED ORDER — IOHEXOL 300 MG/ML  SOLN
100.0000 mL | Freq: Once | INTRAMUSCULAR | Status: AC | PRN
  Administered 2022-01-20: 100 mL via INTRAVENOUS

## 2022-01-20 MED ORDER — ACETAMINOPHEN 500 MG PO TABS
1000.0000 mg | ORAL_TABLET | Freq: Once | ORAL | Status: AC
  Administered 2022-01-20: 1000 mg via ORAL
  Filled 2022-01-20: qty 2

## 2022-01-20 MED ORDER — KETOROLAC TROMETHAMINE 30 MG/ML IJ SOLN
15.0000 mg | Freq: Once | INTRAMUSCULAR | Status: AC
Start: 2022-01-20 — End: 2022-01-20
  Administered 2022-01-20: 15 mg via INTRAVENOUS
  Filled 2022-01-20: qty 1

## 2022-01-20 MED ORDER — SODIUM CHLORIDE 0.9 % IV SOLN
1.0000 g | INTRAVENOUS | Status: DC
  Administered 2022-01-21 (×2): 1 g via INTRAVENOUS
  Filled 2022-01-20: qty 10
  Filled 2022-01-20: qty 1

## 2022-01-20 MED ORDER — SODIUM CHLORIDE 0.9 % IV BOLUS
1000.0000 mL | Freq: Once | INTRAVENOUS | Status: AC
  Administered 2022-01-20: 1000 mL via INTRAVENOUS

## 2022-01-20 MED ORDER — HYDROMORPHONE HCL 1 MG/ML IJ SOLN
0.5000 mg | Freq: Once | INTRAMUSCULAR | Status: AC
  Administered 2022-01-20: 0.5 mg via INTRAVENOUS
  Filled 2022-01-20: qty 0.5

## 2022-01-20 MED ORDER — DEXTROSE 50 % IV SOLN: 0.0000 mL | INTRAVENOUS | Status: DC | PRN

## 2022-01-20 MED ORDER — LACTATED RINGERS IV SOLN: INTRAVENOUS | Status: DC

## 2022-01-20 MED ORDER — INSULIN ASPART 100 UNIT/ML IJ SOLN
10.0000 [IU] | Freq: Once | INTRAMUSCULAR | Status: AC
  Administered 2022-01-20: 10 [IU] via INTRAVENOUS
  Filled 2022-01-20: qty 1

## 2022-01-20 MED ORDER — ONDANSETRON HCL 4 MG/2ML IJ SOLN
4.0000 mg | Freq: Once | INTRAMUSCULAR | Status: AC
  Administered 2022-01-20: 4 mg via INTRAVENOUS
  Filled 2022-01-20: qty 2

## 2022-01-20 MED ORDER — LACTATED RINGERS IV BOLUS
500.0000 mL | Freq: Once | INTRAVENOUS | Status: AC
  Administered 2022-01-21: 500 mL via INTRAVENOUS

## 2022-01-20 MED ORDER — HEPARIN SODIUM (PORCINE) 5000 UNIT/ML IJ SOLN
5000.0000 [IU] | Freq: Three times a day (TID) | INTRAMUSCULAR | Status: DC
  Administered 2022-01-21 – 2022-01-22 (×5): 5000 [IU] via SUBCUTANEOUS
  Filled 2022-01-20 (×5): qty 1

## 2022-01-20 NOTE — Assessment & Plan Note (Signed)
Patient's home insulin pump discontinued currently on a protocol based insulin regimen. ? ?

## 2022-01-20 NOTE — Assessment & Plan Note (Signed)
We will start patient on Rocephin and follow cultures. ?

## 2022-01-20 NOTE — H&P (Signed)
?History and Physical  ? ? ?Patient: David Ramsey JGO:[id number] DOB: 1996-02-02 ?DOA: 01/20/2022 ?DOS: the patient was seen and examined on 01/21/2022 ?PCP: Patient, No Pcp Per (Inactive)  ? ?Patient coming from: Home ? ?Chief Complaint:  ?Chief Complaint  ?Patient presents with  ? Hyperglycemia  ? ?HPI: David Ramsey is a 26 y.o. male with medical history significant of DM I, n/v/abd pain. Ct abd shows: ?IMPRESSION: ?Acute on chronic pancreatitis.  No evidence of complication.  ?Thick-walled bladder, correlate for cystitis.  ?Heterogeneous perfusion of the kidneys bilaterally, nonspecific, but ?raising the possibility of pyelonephritis. ?D/W pt about his current condition of acute pancreatitis, and DKA, and cystitis.  ?D/W pt  about npo and plan to identify etiology of pancreatitis.  ?  ?Review of Systems  ?Gastrointestinal:  Positive for abdominal pain, nausea and vomiting.  ?All other systems reviewed and are negative. ? ?Past Medical History:  ?Diagnosis Date  ? AKI (acute kidney injury) ([place]) 12/16/2016  ? Diabetes mellitus without complication ([place])   ? INSULIN DEPENDENT   SINCE AGE 69  ? DKA (diabetic ketoacidoses) 02/17/2017  ? Hashimoto's disease   ? MRSA infection   ? Pneumonia   ? Liver contusion, lung effusion, sepsis multiorgan failure, no clear source  ? ?Past Surgical History:  ?Procedure Laterality Date  ? CHEST TUBE INSERTION    ? ?Social History:  reports that he quit smoking about 4 years ago. His smoking use included cigarettes. He has a 2.00 pack-year smoking history. He has never used smokeless tobacco. He reports current alcohol use. He reports that he does not use drugs. ? ?Allergies  ?Allergen Reactions  ? Tape Rash  ?  Blisters- can use kerlix tape  ? ? ?Family History  ?Problem Relation Age of Onset  ? Diabetes Cousin   ? Heart disease Other   ? Cancer Other   ? Thyroid disease Other   ? Healthy Mother   ? ? ?Prior to Admission medications   ?Medication Sig Start Date End Date Taking?  Authorizing Provider  ?HUMALOG KWIKPEN 100 UNIT/ML KwikPen Inject 0-9 Units into the skin 3 (three) times daily. Sliding scale insulin Less than 70 initiate hypoglycemia protocol 70-120  0 units 120-150 1 unit 151-200 2 units 201-250 3 units 251-300 5 units 301-350 7 units 351-400 9 units Greater than 400 call MD 12/01/21  Yes Donne Hazel, MD  ?insulin detemir (LEVEMIR) 100 UNIT/ML FlexPen Inject 24 Units into the skin daily. 12/01/21  Yes Donne Hazel, MD  ?insulin lispro (HUMALOG) 100 UNIT/ML KwikPen Inject 6 Units into the skin 3 (three) times daily. 12/01/21  Yes Donne Hazel, MD  ?acetaminophen (TYLENOL) 500 MG tablet Take 1,000 mg by mouth every 6 (six) hours as needed for moderate pain or headache.    [provider]  ?albuterol (VENTOLIN HFA) 108 (90 Base) MCG/ACT inhaler Inhale 2 puffs into the lungs every 6 (six) hours as needed for wheezing or shortness of breath. 11/08/21   Loletha Grayer, MD  ?blood glucose meter kit and supplies KIT Dispense based on patient and insurance preference. Use up to four times daily as directed. 11/03/20   Raiford Noble Latif, DO  ?FLUoxetine (PROZAC) 20 MG capsule Take 1 capsule (20 mg total) by mouth daily. 11/08/21 12/08/21  Loletha Grayer, MD  ?gabapentin (NEURONTIN) 300 MG capsule Take 1 capsule (300 mg total) by mouth 3 (three) times daily. ?Patient not taking: Reported on 01/20/2022 11/08/21   Loletha Grayer, MD  ?Insulin Pen  Needle (PEN NEEDLES 3/16") 31G X 5 MM MISC 1 Container by Does not apply route 4 (four) times daily -  before meals and at bedtime. 12/01/21   Donne Hazel, MD  ?ipratropium (ATROVENT) 0.06 % nasal spray Place 2 sprays into both nostrils 2 (two) times daily. ?Patient not taking: Reported on 01/20/2022 11/08/21   Loletha Grayer, MD  ?levothyroxine (SYNTHROID) 150 MCG tablet Take 1 tablet (150 mcg total) by mouth daily. 11/08/21 12/08/21  Loletha Grayer, MD  ?oxyCODONE (OXY IR/ROXICODONE) 5 MG immediate release tablet Take 1 tablet (5 mg  total) by mouth every 6 (six) hours as needed for severe pain. ?Patient not taking: Reported on 01/20/2022 12/01/21   Donne Hazel, MD  ?pantoprazole (PROTONIX) 40 MG tablet Take 1 tablet (40 mg total) by mouth daily. 02/18/17 06/06/20  GhimireHenreitta Leber, MD  ? ? ?Physical Exam: ?Vitals:  ? 01/20/22 2027 01/20/22 2100 01/20/22 2200 01/20/22 2300  ?BP: 135/89 (!) 136/93 (!) 145/94 (!) 142/79  ?Pulse: (!) 112 96 (!) 111 (!) 109  ?Resp: 18 14 (!) 21 (!) 22  ?Temp:      ?TempSrc:      ?SpO2: 99% 100% 98% 99%  ?Weight:      ?Height:      ? ?Physical Exam ?Vitals and nursing note reviewed.  ?Constitutional:   ?   General: He is not in acute distress. ?   Appearance: Normal appearance. He is not ill-appearing, toxic-appearing or diaphoretic.  ?HENT:  ?   Head: Normocephalic and atraumatic.  ?   Right Ear: Hearing and external ear normal.  ?   Left Ear: Hearing and external ear normal.  ?   Nose: Nose normal. No nasal deformity.  ?   Mouth/Throat:  ?   Lips: Pink.  ?   Mouth: Mucous membranes are moist.  ?   Tongue: No lesions.  ?   Pharynx: Oropharynx is clear.  ?Eyes:  ?   Extraocular Movements: Extraocular movements intact.  ?   Pupils: Pupils are equal, round, and reactive to light.  ?Neck:  ?   Vascular: No carotid bruit.  ?Cardiovascular:  ?   Rate and Rhythm: Normal rate and regular rhythm.  ?   Pulses: Normal pulses.  ?   Heart sounds: Normal heart sounds.  ?Pulmonary:  ?   Effort: Pulmonary effort is normal.  ?   Breath sounds: Normal breath sounds.  ?Abdominal:  ?   General: Bowel sounds are normal. There is no distension.  ?   Palpations: Abdomen is soft. There is no mass.  ?   Tenderness: There is no abdominal tenderness. There is no guarding.  ?   Hernia: No hernia is present.  ?Musculoskeletal:  ?   Right lower leg: No edema.  ?   Left lower leg: No edema.  ?Skin: ?   General: Skin is warm.  ?Neurological:  ?   General: No focal deficit present.  ?   Mental Status: He is alert and oriented to person, place,  and time.  ?   Cranial Nerves: Cranial nerves 2-12 are intact.  ?   Motor: Motor function is intact.  ?Psychiatric:     ?   Attention and Perception: Attention normal.     ?   Mood and Affect: Mood normal.     ?   Speech: Speech normal.     ?   Behavior: Behavior normal. Behavior is cooperative.     ?   Cognition and  Memory: Cognition normal.  ? ? ?Data Reviewed: ?Results for orders placed or performed during the hospital encounter of 01/20/22 (from the past 24 hour(s))  ?CBG monitoring, ED     Status: Abnormal  ? Collection Time: 01/20/22  6:10 PM  ?Result Value Ref Range  ? Glucose-Capillary >600 (HH) 70 - 99 mg/dL  ?Basic metabolic panel     Status: Abnormal  ? Collection Time: 01/20/22  6:19 PM  ?Result Value Ref Range  ? Sodium 130 (L) 135 - 145 mmol/L  ? Potassium 4.6 3.5 - 5.1 mmol/L  ? Chloride 91 (L) 98 - 111 mmol/L  ? CO2 22 22 - 32 mmol/L  ? Glucose, Bld 618 (HH) 70 - 99 mg/dL  ? BUN 14 6 - 20 mg/dL  ? Creatinine, Ser 1.11 0.61 - 1.24 mg/dL  ? Calcium 9.3 8.9 - 10.3 mg/dL  ? GFR, Estimated >60 >60 mL/min  ? Anion gap 17 (H) 5 - 15  ?CBC     Status: Abnormal  ? Collection Time: 01/20/22  6:19 PM  ?Result Value Ref Range  ? WBC 9.0 4.0 - 10.5 K/uL  ? RBC 5.00 4.22 - 5.81 MIL/uL  ? Hemoglobin 15.3 13.0 - 17.0 g/dL  ? HCT 44.8 39.0 - 52.0 %  ? MCV 89.6 80.0 - 100.0 fL  ? MCH 30.6 26.0 - 34.0 pg  ? MCHC 34.2 30.0 - 36.0 g/dL  ? RDW 12.0 11.5 - 15.5 %  ? Platelets 414 (H) 150 - 400 K/uL  ? nRBC 0.0 0.0 - 0.2 %  ?Beta-hydroxybutyric acid     Status: Abnormal  ? Collection Time: 01/20/22  6:19 PM  ?Result Value Ref Range  ? Beta-Hydroxybutyric Acid 3.90 (H) 0.05 - 0.27 mmol/L  ?Lipase, blood     Status: Abnormal  ? Collection Time: 01/20/22  6:22 PM  ?Result Value Ref Range  ? Lipase 2,221 (H) 11 - 51 U/L  ?Troponin I (High Sensitivity)     Status: None  ? Collection Time: 01/20/22  6:22 PM  ?Result Value Ref Range  ? Troponin I (High Sensitivity) 3 <18 ng/L  ?Blood gas, venous     Status: Abnormal  ? Collection  Time: 01/20/22  6:42 PM  ?Result Value Ref Range  ? pH, Ven 7.33 7.25 - 7.43  ? pCO2, Ven 43 (L) 44 - 60 mmHg  ? pO2, Ven 40 32 - 45 mmHg  ? Bicarbonate 22.7 20.0 - 28.0 mmol/L  ? Acid-base deficit 3.2 (H

## 2022-01-20 NOTE — Assessment & Plan Note (Signed)
Patient's most recent blood work was about a year ago we will however continue patient on his home regimen of Synthroid 150 mcg and check his thyroid studies. ?

## 2022-01-20 NOTE — ED Triage Notes (Signed)
Pt to ED via POV from home for possible DKA. Pt report high ketones for the past 2 days. Pt also reports nausea, CP, polyuria, and polydipsia.  ? ?CBG at home 120 and CBG in triage >500.  ?

## 2022-01-20 NOTE — Assessment & Plan Note (Signed)
Stat lipid panel and ultrasound of the abdomen. ?N.p.o. except sips with clear liquids admitted as tolerated. ?

## 2022-01-20 NOTE — Assessment & Plan Note (Signed)
IV insulin infusion with monitoring of anion gap serum bicarb and electrolytes especially magnesium potassium and phosphorus. ? ?Intake/Output Summary (Last 24 hours) at 01/20/2022 2334 ?Last data filed at 01/20/2022 2143 ?Gross per 24 hour  ?Intake 2000 ml  ?Output --  ?Net 2000 ml  ? ? ?

## 2022-01-20 NOTE — ED Provider Notes (Signed)
? ?[facility] ?Provider Note ? ? ? Event Date/Time  ? First MD Initiated Contact with Patient 01/20/22 1823   ?  (approximate) ? ? ?History  ? ?Hyperglycemia ? ? ?HPI ? ?David Ramsey is a 26 y.o. male who presents to the ED for evaluation of Hyperglycemia ?  ?I reviewed 3/27 medical DC summary where patient was admitted for DKA associated with poorly controlled type 1 diabetes. ? ?Patient presents to the ED for the evaluation of hyperglycemia and his "sugars randomly jumping up."  He reports his symptoms are starting today, generalized abdominal pain, chronic neuropathy and poor control of his glucose despite adherence to his insulin regimen.  Denies any fevers.  Does report nausea, no emesis or diarrhea. ? ? ?Physical Exam  ? ?Triage Vital Signs: ?ED Triage Vitals [01/20/22 1811]  ?Enc Vitals Group  ?   BP (!) 140/93  ?   Pulse Rate (!) 116  ?   Resp 18  ?   Temp 98.3 ?F (36.8 ?C)  ?   Temp Source Oral  ?   SpO2 96 %  ?   Weight 160 lb (72.6 kg)  ?   Height 5\' 6"  (1.676 m)  ?   Head Circumference   ?   Peak Flow   ?   Pain Score 7  ?   Pain Loc   ?   Pain Edu?   ?   Excl. in GC?   ? ? ?Most recent vital signs: ?Vitals:  ? 01/20/22 2200 01/20/22 2300  ?BP: (!) 145/94 (!) 142/79  ?Pulse: (!) 111 (!) 109  ?Resp: (!) 21 (!) 22  ?Temp:    ?SpO2: 98% 99%  ? ? ?General: Awake, no distress.  Appears uncomfortable.  Dry mucous membranes. ?CV:  Good peripheral perfusion.  Tachycardic and regular ?Resp:  Normal effort.  CTA B ?Abd:  No distention.  Periumbilical tenderness without peritoneal features. ?MSK:  No deformity noted.  ?Neuro:  No focal deficits appreciated. ?Other:   ? ? ?ED Results / Procedures / Treatments  ? ?Labs ?(all labs ordered are listed, but only abnormal results are displayed) ?Labs Reviewed  ?BASIC METABOLIC PANEL - Abnormal; Notable for the following components:  ?    Result Value  ? Sodium 130 (*)   ? Chloride 91 (*)   ? Glucose, Bld 618 (*)   ? Anion gap 17 (*)   ? All other  components within normal limits  ?CBC - Abnormal; Notable for the following components:  ? Platelets 414 (*)   ? All other components within normal limits  ?URINALYSIS, ROUTINE W REFLEX MICROSCOPIC - Abnormal; Notable for the following components:  ? Color, Urine COLORLESS (*)   ? APPearance CLEAR (*)   ? Glucose, UA >=500 (*)   ? Ketones, ur 20 (*)   ? All other components within normal limits  ?BETA-HYDROXYBUTYRIC ACID - Abnormal; Notable for the following components:  ? Beta-Hydroxybutyric Acid 3.90 (*)   ? All other components within normal limits  ?BLOOD GAS, VENOUS - Abnormal; Notable for the following components:  ? pCO2, Ven 43 (*)   ? Acid-base deficit 3.2 (*)   ? All other components within normal limits  ?LIPASE, BLOOD - Abnormal; Notable for the following components:  ? Lipase 2,221 (*)   ? All other components within normal limits  ?BASIC METABOLIC PANEL - Abnormal; Notable for the following components:  ? Sodium 131 (*)   ? Chloride 96 (*)   ?  CO2 21 (*)   ? Glucose, Bld 485 (*)   ? Calcium 8.5 (*)   ? All other components within normal limits  ?CBG MONITORING, ED - Abnormal; Notable for the following components:  ? Glucose-Capillary >600 (*)   ? All other components within normal limits  ?CBG MONITORING, ED - Abnormal; Notable for the following components:  ? Glucose-Capillary 538 (*)   ? All other components within normal limits  ?LIPID PANEL  ?BASIC METABOLIC PANEL  ?BASIC METABOLIC PANEL  ?BASIC METABOLIC PANEL  ?BASIC METABOLIC PANEL  ?CBC  ?HEPATIC FUNCTION PANEL  ?CBG MONITORING, ED  ?TROPONIN I (HIGH SENSITIVITY)  ? ? ?EKG ?Sinus tachycardia with rate of 117 bpm.  Rightward axis and incomplete right bundle.  T wave inversions inferiorly and laterally without STEMI. ?Similar to EKG from March which also had T wave inversions inferiorly. ? ?RADIOLOGY ?CXR interpreted by me without evidence of acute cardiopulmonary pathology. ?CT abdomen/pelvis interpreted by me without evidence of SBO ? ?Official  radiology report(s): ?CT ABDOMEN PELVIS W CONTRAST ? ?Result Date: 01/20/2022 ?CLINICAL DATA:  Acute pancreatitis EXAM: CT ABDOMEN AND PELVIS WITH CONTRAST TECHNIQUE: Multidetector CT imaging of the abdomen and pelvis was performed using the standard protocol following bolus administration of intravenous contrast. RADIATION DOSE REDUCTION: This exam was performed according to the departmental dose-optimization program which includes automated exposure control, adjustment of the mA and/or kV according to patient size and/or use of iterative reconstruction technique. CONTRAST:  100mL OMNIPAQUE IOHEXOL 300 MG/ML  SOLN COMPARISON:  11/30/2021 FINDINGS: Lower chest: Mild right basilar scarring/atelectasis. Hepatobiliary: Liver is within normal limits. Gallbladder is decompressed. No intrahepatic or extrahepatic duct dilatation. Pancreas: Mild peripancreatic inflammatory stranding, reflecting acute pancreatitis. No drainable fluid collection/walled-off necrosis. Parenchymal calcifications along the posterior aspect of the pancreatic head/uncinate process, reflecting sequela of prior/chronic pancreatitis. No pancreatic ductal dilatation. Spleen: Within normal limits. Adrenals/Urinary Tract: Adrenal glands are within normal limits. Heterogeneous perfusion of the kidneys bilaterally, nonspecific but at least raising the possibility of pyelonephritis. No hydronephrosis. Thick-walled bladder. Stomach/Bowel: Stomach is within normal limits. No evidence of bowel obstruction. Normal appendix (series 2/image 46). No colonic wall thickening or inflammatory changes. Vascular/Lymphatic: No evidence of abdominal aortic aneurysm. No suspicious abdominopelvic lymphadenopathy. Reproductive: Prostate is unremarkable. Other: Small volume pelvic ascites. Musculoskeletal: Visualized osseous structures are within normal limits. IMPRESSION: Acute on chronic pancreatitis.  No evidence of complication. Thick-walled bladder, correlate for  cystitis. Heterogeneous perfusion of the kidneys bilaterally, nonspecific, but raising the possibility of pyelonephritis. Electronically Signed   By: Charline BillsSriyesh  Krishnan M.D.   On: 01/20/2022 23:12  ? ?DG Chest Portable 1 View ? ?Result Date: 01/20/2022 ?CLINICAL DATA:  DKA EXAM: PORTABLE CHEST 1 VIEW COMPARISON:  11/30/2021 FINDINGS: The heart size and mediastinal contours are within normal limits. Both lungs are clear. The visualized skeletal structures are unremarkable. IMPRESSION: No active disease. Electronically Signed   By: Alcide CleverMark  Lukens M.D.   On: 01/20/2022 19:01   ? ?PROCEDURES and INTERVENTIONS: ? ?.1-3 Lead EKG Interpretation ?Performed by: Delton PrairieSmith, Alaney Witter, MD ?Authorized by: Delton PrairieSmith, Merrill Villarruel, MD  ? ?  Interpretation: abnormal   ?  ECG rate:  110 ?  ECG rate assessment: tachycardic   ?  Rhythm: sinus tachycardia   ?  Ectopy: none   ?  Conduction: normal   ?.Critical Care ?Performed by: Delton PrairieSmith, Coleston Dirosa, MD ?Authorized by: Delton PrairieSmith, Mikel Hardgrove, MD  ? ?Critical care provider statement:  ?  Critical care time (minutes):  30 ?  Critical care time was exclusive  of:  Separately billable procedures and treating other patients ?  Critical care was necessary to treat or prevent imminent or life-threatening deterioration of the following conditions:  Metabolic crisis and endocrine crisis ?  Critical care was time spent personally by me on the following activities:  Development of treatment plan with patient or surrogate, discussions with consultants, evaluation of patient's response to treatment, examination of patient, ordering and review of laboratory studies, ordering and review of radiographic studies, ordering and performing treatments and interventions, pulse oximetry, re-evaluation of patient's condition and review of old charts ? ?Medications  ?lactated ringers infusion ( Intravenous New Bag/Given 01/20/22 2303)  ?heparin injection 5,000 Units (has no administration in time range)  ?lactated ringers bolus 500 mL (has no  administration in time range)  ?insulin regular, human (MYXREDLIN) 100 units/ 100 mL infusion (has no administration in time range)  ?dextrose 5 % in lactated ringers infusion (has no administration in time range)

## 2022-01-20 NOTE — Assessment & Plan Note (Signed)
Currently we will continue patient on DKA protocol and insulin regimen. ?Home insulin pump has been stopped. ? ?

## 2022-01-20 NOTE — Assessment & Plan Note (Signed)
Attribute to combination of acidosis and pancreatitis. ?Supportive care with IV fluid hydration and GI consult as deemed appropriate. ?Lipid panel.  Ultrasound pending. ?

## 2022-01-20 NOTE — Assessment & Plan Note (Signed)
Supportive care with as needed Zofran, IV PPI therapy, Carafate as needed. ?

## 2022-01-21 DIAGNOSIS — E1065 Type 1 diabetes mellitus with hyperglycemia: Secondary | ICD-10-CM

## 2022-01-21 DIAGNOSIS — K858 Other acute pancreatitis without necrosis or infection: Secondary | ICD-10-CM | POA: Diagnosis not present

## 2022-01-21 DIAGNOSIS — E1010 Type 1 diabetes mellitus with ketoacidosis without coma: Secondary | ICD-10-CM | POA: Diagnosis not present

## 2022-01-21 LAB — BASIC METABOLIC PANEL
Anion gap: 10 (ref 5–15)
Anion gap: 11 (ref 5–15)
Anion gap: 6 (ref 5–15)
Anion gap: 7 (ref 5–15)
Anion gap: 9 (ref 5–15)
BUN: 12 mg/dL (ref 6–20)
BUN: 12 mg/dL (ref 6–20)
BUN: 13 mg/dL (ref 6–20)
BUN: 14 mg/dL (ref 6–20)
BUN: 14 mg/dL (ref 6–20)
CO2: 23 mmol/L (ref 22–32)
CO2: 25 mmol/L (ref 22–32)
CO2: 25 mmol/L (ref 22–32)
CO2: 27 mmol/L (ref 22–32)
CO2: 27 mmol/L (ref 22–32)
Calcium: 8.3 mg/dL — ABNORMAL LOW (ref 8.9–10.3)
Calcium: 8.4 mg/dL — ABNORMAL LOW (ref 8.9–10.3)
Calcium: 8.4 mg/dL — ABNORMAL LOW (ref 8.9–10.3)
Calcium: 8.4 mg/dL — ABNORMAL LOW (ref 8.9–10.3)
Calcium: 8.5 mg/dL — ABNORMAL LOW (ref 8.9–10.3)
Chloride: 101 mmol/L (ref 98–111)
Chloride: 103 mmol/L (ref 98–111)
Chloride: 105 mmol/L (ref 98–111)
Chloride: 97 mmol/L — ABNORMAL LOW (ref 98–111)
Chloride: 99 mmol/L (ref 98–111)
Creatinine, Ser: 0.82 mg/dL (ref 0.61–1.24)
Creatinine, Ser: 0.91 mg/dL (ref 0.61–1.24)
Creatinine, Ser: 0.99 mg/dL (ref 0.61–1.24)
Creatinine, Ser: 1.06 mg/dL (ref 0.61–1.24)
Creatinine, Ser: 1.09 mg/dL (ref 0.61–1.24)
GFR, Estimated: >60 mL/min (ref >60)
GFR, Estimated: >60 mL/min (ref >60)
GFR, Estimated: >60 mL/min (ref >60)
GFR, Estimated: >60 mL/min (ref >60)
GFR, Estimated: >60 mL/min (ref >60)
Glucose, Bld: 133 mg/dL — ABNORMAL HIGH (ref 70–99)
Glucose, Bld: 141 mg/dL — ABNORMAL HIGH (ref 70–99)
Glucose, Bld: 260 mg/dL — ABNORMAL HIGH (ref 70–99)
Glucose, Bld: 359 mg/dL — ABNORMAL HIGH (ref 70–99)
Glucose, Bld: 484 mg/dL — ABNORMAL HIGH (ref 70–99)
Potassium: 3.3 mmol/L — ABNORMAL LOW (ref 3.5–5.1)
Potassium: 3.3 mmol/L — ABNORMAL LOW (ref 3.5–5.1)
Potassium: 3.4 mmol/L — ABNORMAL LOW (ref 3.5–5.1)
Potassium: 4.1 mmol/L (ref 3.5–5.1)
Potassium: 4.9 mmol/L (ref 3.5–5.1)
Sodium: 132 mmol/L — ABNORMAL LOW (ref 135–145)
Sodium: 133 mmol/L — ABNORMAL LOW (ref 135–145)
Sodium: 135 mmol/L (ref 135–145)
Sodium: 137 mmol/L (ref 135–145)
Sodium: 138 mmol/L (ref 135–145)

## 2022-01-21 LAB — CBG MONITORING, ED
Glucose-Capillary: 113 mg/dL — ABNORMAL HIGH (ref 70–99)
Glucose-Capillary: 119 mg/dL — ABNORMAL HIGH (ref 70–99)
Glucose-Capillary: 127 mg/dL — ABNORMAL HIGH (ref 70–99)
Glucose-Capillary: 128 mg/dL — ABNORMAL HIGH (ref 70–99)
Glucose-Capillary: 132 mg/dL — ABNORMAL HIGH (ref 70–99)
Glucose-Capillary: 138 mg/dL — ABNORMAL HIGH (ref 70–99)
Glucose-Capillary: 157 mg/dL — ABNORMAL HIGH (ref 70–99)
Glucose-Capillary: 168 mg/dL — ABNORMAL HIGH (ref 70–99)
Glucose-Capillary: 217 mg/dL — ABNORMAL HIGH (ref 70–99)
Glucose-Capillary: 359 mg/dL — ABNORMAL HIGH (ref 70–99)

## 2022-01-21 LAB — BETA-HYDROXYBUTYRIC ACID: Beta-Hydroxybutyric Acid: 0.51 mmol/L — ABNORMAL HIGH (ref 0.05–0.27)

## 2022-01-21 LAB — LIPID PANEL
Cholesterol: 205 mg/dL — ABNORMAL HIGH (ref 0–200)
HDL: 43 mg/dL (ref >40)
LDL Cholesterol: 111 mg/dL — ABNORMAL HIGH (ref 0–99)
Total CHOL/HDL Ratio: 4.8 RATIO
Triglycerides: 253 mg/dL — ABNORMAL HIGH (ref <150)
VLDL: 51 mg/dL — ABNORMAL HIGH (ref 0–40)

## 2022-01-21 LAB — HEPATIC FUNCTION PANEL
ALT: 20 U/L (ref 0–44)
AST: 20 U/L (ref 15–41)
Albumin: 3.1 g/dL — ABNORMAL LOW (ref 3.5–5.0)
Alkaline Phosphatase: 78 U/L (ref 38–126)
Bilirubin, Direct: 0.1 mg/dL (ref 0.0–0.2)
Indirect Bilirubin: 1.4 mg/dL — ABNORMAL HIGH (ref 0.3–0.9)
Total Bilirubin: 1.5 mg/dL — ABNORMAL HIGH (ref 0.3–1.2)
Total Protein: 6 g/dL — ABNORMAL LOW (ref 6.5–8.1)

## 2022-01-21 LAB — GLUCOSE, CAPILLARY
Glucose-Capillary: 341 mg/dL — ABNORMAL HIGH (ref 70–99)
Glucose-Capillary: 447 mg/dL — ABNORMAL HIGH (ref 70–99)
Glucose-Capillary: 327 mg/dL — ABNORMAL HIGH (ref 70–99)
Glucose-Capillary: 268 mg/dL — ABNORMAL HIGH (ref 70–99)

## 2022-01-21 LAB — TSH: TSH: 90.979 u[IU]/mL — ABNORMAL HIGH (ref 0.350–4.500)

## 2022-01-21 LAB — T4, FREE: Free T4: 0.62 ng/dL (ref 0.61–1.12)

## 2022-01-21 MED ORDER — POTASSIUM CHLORIDE CRYS ER 20 MEQ PO TBCR
40.0000 meq | EXTENDED_RELEASE_TABLET | Freq: Once | ORAL | Status: AC
  Administered 2022-01-21: 40 meq via ORAL
  Filled 2022-01-21: qty 2

## 2022-01-21 MED ORDER — FLUOXETINE HCL 20 MG PO CAPS
20.0000 mg | ORAL_CAPSULE | Freq: Every day | ORAL | Status: DC
  Administered 2022-01-21 – 2022-01-22 (×2): 20 mg via ORAL
  Filled 2022-01-21 (×2): qty 1

## 2022-01-21 MED ORDER — MORPHINE SULFATE (PF) 2 MG/ML IV SOLN
2.0000 mg | Freq: Once | INTRAVENOUS | Status: AC
  Administered 2022-01-21: 2 mg via INTRAVENOUS
  Filled 2022-01-21: qty 1

## 2022-01-21 MED ORDER — INSULIN ASPART 100 UNIT/ML IJ SOLN
4.0000 [IU] | Freq: Three times a day (TID) | INTRAMUSCULAR | Status: DC
  Administered 2022-01-21 (×2): 4 [IU] via SUBCUTANEOUS
  Filled 2022-01-21 (×2): qty 1

## 2022-01-21 MED ORDER — ONDANSETRON HCL 4 MG/2ML IJ SOLN
4.0000 mg | Freq: Four times a day (QID) | INTRAMUSCULAR | Status: DC | PRN
  Administered 2022-01-22: 4 mg via INTRAVENOUS
  Filled 2022-01-21: qty 2

## 2022-01-21 MED ORDER — INSULIN ASPART 100 UNIT/ML IJ SOLN
0.0000 [IU] | Freq: Three times a day (TID) | INTRAMUSCULAR | Status: DC
  Administered 2022-01-21: 7 [IU] via SUBCUTANEOUS
  Filled 2022-01-21: qty 1

## 2022-01-21 MED ORDER — INSULIN ASPART 100 UNIT/ML IJ SOLN
0.0000 [IU] | Freq: Every day | INTRAMUSCULAR | Status: DC
  Administered 2022-01-21: 3 [IU] via SUBCUTANEOUS
  Filled 2022-01-21: qty 1

## 2022-01-21 MED ORDER — MORPHINE SULFATE (PF) 2 MG/ML IV SOLN
1.0000 mg | INTRAVENOUS | Status: DC | PRN
  Administered 2022-01-21 – 2022-01-22 (×2): 1 mg via INTRAVENOUS
  Filled 2022-01-21 (×2): qty 1

## 2022-01-21 MED ORDER — INSULIN ASPART 100 UNIT/ML IJ SOLN
0.0000 [IU] | Freq: Three times a day (TID) | INTRAMUSCULAR | Status: DC
  Administered 2022-01-21: 15 [IU] via SUBCUTANEOUS
  Administered 2022-01-22: 8 [IU] via SUBCUTANEOUS
  Filled 2022-01-21 (×2): qty 1

## 2022-01-21 MED ORDER — LEVOTHYROXINE SODIUM 50 MCG PO TABS
150.0000 ug | ORAL_TABLET | Freq: Every day | ORAL | Status: DC
  Administered 2022-01-21 – 2022-01-22 (×2): 150 ug via ORAL
  Filled 2022-01-21: qty 1
  Filled 2022-01-21: qty 3

## 2022-01-21 MED ORDER — INSULIN DETEMIR 100 UNIT/ML ~~LOC~~ SOLN
11.0000 [IU] | Freq: Two times a day (BID) | SUBCUTANEOUS | Status: DC
  Administered 2022-01-21: 11 [IU] via SUBCUTANEOUS
  Filled 2022-01-21 (×2): qty 0.11

## 2022-01-21 MED ORDER — GABAPENTIN 300 MG PO CAPS
300.0000 mg | ORAL_CAPSULE | Freq: Three times a day (TID) | ORAL | Status: DC
  Administered 2022-01-21 – 2022-01-22 (×4): 300 mg via ORAL
  Filled 2022-01-21 (×4): qty 1

## 2022-01-21 MED ORDER — ALBUTEROL SULFATE HFA 108 (90 BASE) MCG/ACT IN AERS: 2.0000 | INHALATION_SPRAY | Freq: Four times a day (QID) | RESPIRATORY_TRACT | Status: DC | PRN

## 2022-01-21 MED ORDER — INSULIN ASPART 100 UNIT/ML IJ SOLN: 0.0000 [IU] | Freq: Three times a day (TID) | INTRAMUSCULAR | Status: DC

## 2022-01-21 MED ORDER — INSULIN DETEMIR 100 UNIT/ML ~~LOC~~ SOLN
10.0000 [IU] | Freq: Every day | SUBCUTANEOUS | Status: DC
  Administered 2022-01-21: 10 [IU] via SUBCUTANEOUS
  Filled 2022-01-21: qty 0.1

## 2022-01-21 MED ORDER — ALBUTEROL SULFATE (2.5 MG/3ML) 0.083% IN NEBU: 2.5000 mg | INHALATION_SOLUTION | Freq: Four times a day (QID) | RESPIRATORY_TRACT | Status: DC | PRN

## 2022-01-21 MED ORDER — OXYCODONE-ACETAMINOPHEN 5-325 MG PO TABS
1.0000 | ORAL_TABLET | Freq: Four times a day (QID) | ORAL | Status: DC | PRN
Start: 2022-01-21 — End: 2022-01-22
  Administered 2022-01-21 – 2022-01-22 (×3): 1 via ORAL
  Filled 2022-01-21 (×3): qty 1

## 2022-01-21 NOTE — Progress Notes (Addendum)
Inpatient Diabetes Program Recommendations ? ?AACE/ADA: New Consensus Statement on Inpatient Glycemic Control  ? ?Target Ranges:  Prepandial:   less than 140 mg/dL ?     Peak postprandial:   less than 180 mg/dL (1-2 hours) ?     Critically ill patients:  140 - 180 mg/dL  ? ? Latest Reference Range & Units 01/21/22 00:27 01/21/22 01:56 01/21/22 02:39 01/21/22 03:42 01/21/22 04:42 01/21/22 05:49 01/21/22 06:59  ?Glucose-Capillary 70 - 99 mg/dL 088 (H) 110 (H) 315 (H) 119 (H) 132 (H) 138 (H) 128 (H)  ? ? Latest Reference Range & Units 01/20/22 18:19  ?CO2 22 - 32 mmol/L 22  ?Glucose 70 - 99 mg/dL 945 (HH)  ?BUN 6 - 20 mg/dL 14  ?Creatinine 0.61 - 1.24 mg/dL 8.59  ?Calcium 8.9 - 10.3 mg/dL 9.3  ?Anion gap 5 - 15  17 (H)  ? ? Latest Reference Range & Units 01/20/22 18:19  ?Beta-Hydroxybutyric Acid 0.05 - 0.27 mmol/L 3.90 (H)  ? ?Review of Glycemic Control ? ?Diabetes history: DM1 (makes NO insulin; requires basal, correction, and carbohydrate coverage insulin) ?Outpatient Diabetes medications: Levemir 25 units daily, Humalog 1 unit for every 8 grams of carbs, Humalog 1 unit for every 20 mg/dl above target of 292 mg/dl ?Current orders for Inpatient glycemic control: Levemir 10 units daily, Novolog 0-9 units TID with meals ?  ?Inpatient Diabetes Program Recommendations:   ?  ?Insulin:  Patient has been transitioned from IV to SQ insulin and given Levemir 10 units at 10:20 am today. Please consider changing Levemir to 11 units BID and adding Novolog 4 units TID with meals for meal coverage if patient eats at least 50% of meals. ? ?Outpatient DM needs: At time of discharge, please provide Rx for Levemir Flextouch pens [phone]), Humalog Kwikpens [phone]), glucometer test strips (#[id number]), and FreeStyle Libre2 sensors [phone]). ? ?NOTE: Patient is well known to inpatient diabetes team due to frequent hospital admissions. Patient was last inpatient 11/30/21-12/01/21 and inpatient diabetes coordinator last spoke with patient on  11/05/21 during admission 11/04/21-11/08/21. Per chart, patient has insurance but no PCP. Patient has been advised to get established with a PCP on multiple occassions. Will plan to follow up with patient today. ? ?01/21/22@11 :30-Spoke with patient at bedside regarding DM. Patient confirms that he is taking Levemir 25 units daily and Humalog 1 units per 8 grams of carbs and 1 unit per 20 mg/dl above target glucose of 120 mg/dl. Patient states that he still does not have a PCP and has not made an appointments to establish care. Patient states he is low on all of his medications and asked that he be provided with Rx for all outpatient medications. Patient is using Levemir and Humalog insulin pens. Patient states that he draws insulin out of the insulin pens with a syringe because it is easier to handle for him personally. Patient states he would prefer to continue to get Rx for insulin pens. Patient states that his glucose is running high most of the time but much higher over the past 2 days. Patient reports he was not able to eat or drink much over the past 2 days.  ?Patient reports he needs Rx for test strips as well (has a One Touch and Contour glucometer and not sure which one insurance will cover).  Discussed importance of checking CBGs and maintaining good CBG control to prevent long-term and short-term complications. Explained how hyperglycemia leads to damage within blood vessels which lead to the common complications seen  with uncontrolled diabetes. Stressed to the patient the importance of improving glycemic control to prevent further complications from uncontrolled diabetes. Stressed to patient the need to get established with a PCP so he can get help with getting DM under control and so that he can consistently get medications refilled. Patient states that he has seen an adult Endocrinologist a couple of times but it has been a long time. Discussed local Endocrinologist at [facility] (Dr. Tedd Sias and Dr.  Gershon Crane) as well as Almon Register for PCP. Discussed [facility] in [place] as well (has been given information for clinic in past).  Informed patient that contact information for [facility] would be added to discharge instructions. Asked patient to call as soon as possible to get appointments to establish care with a PCP and an Endocrinologist. Patient asked about getting Rx for Dexcom G6 CGM. Explained that he will need to get established with an Endocrinologist to get Rx for Dexcom. Discussed FreeStyle Libre2 CGM and informed patient that our team has samples of the FreeStyle Libre2 CGM and patient requested samples (order by Dr. Mayford Knife to provide samples of sensors).  Educated patient on FreeStyle Libre2 CGM regarding application and changing CGM sensor (alternate every 14 days on back of arms), 1 hour warm-up, how to scan sensor for glucose reading and information for PCP. Patient has been given Freestyle Libre2 sensor samples.  Provided educational packet regarding FreeStyle Libre2 CGM. Patient applied sensor to back of upper right arm at 12:25.  Explained that glucose readings will not be available until 1 hour after application. Patient plans to use android phone FreeStyle Libre2 app to read [employer] sensor.  Asked patient to be sure to let PCP and Endocrinologist (once established) know about Truitt Merle and allow provider to review reports from [place] app so the provider can use the information to continue to make adjustments with DM medications if needed. Patient verbalized understanding of information and has no questions at this time. ? ? ?Thanks, ?Orlando Penner, RN, MSN, CDE ?Diabetes Coordinator ?Inpatient Diabetes Program ?[phone] (Team Pager from 8am to 5pm) ? ? ?

## 2022-01-21 NOTE — Progress Notes (Signed)
?PROGRESS NOTE ? ? ? ?David Ramsey  [id number] DOB: 04-25-1996 DOA: 01/20/2022 ?PCP: Patient, No Pcp Per (Inactive)  ? ?Assessment & Plan: ?  ?Principal Problem: ?  Nausea & vomiting ?Active Problems: ?  Uncontrolled type 1 diabetes mellitus with hyperglycemia, with long-term current use of insulin ([place]) ?  Acute pancreatitis ?  Abdominal pain, generalized ?  Hypothyroidism ?  DKA (diabetic ketoacidosis) ([place]) ?  UTI (urinary tract infection) ? ?Assessment and Plan: ?DKA: anion gap is closed. D/c IV insulin drip and start levemir, SSI w/ accuchecks ? ?DM1: poorly controlled. Started on levemir, SSI w/ accuchecks  ?  ?Acute on chronic pancreatitis: as per CT. Continue on IVFs  ?  ?Hypothyroidism: continue on home dose of levothyroxine  ? ?Possible UTI: urine cx ordered. Continue on IV rocephin  ? ?Peripheral neuropathy: restarted home dose of gabapentin  ? ?  ? ? ? ? ? ?DVT prophylaxis: heparin  ?Code Status: full  ?Family Communication:  ?Disposition Plan: likely d/c back home tomorrow  ? ?Level of care: Med-Surg ? ?Status is: Inpatient ?Remains inpatient appropriate because: severity of illness ? ? ?Consultants:  ? ? ?Procedures: ? ?Antimicrobials:  rocephin  ? ?Subjective: ?Pt c/o peripheral neuropathy pain  ? ?Objective: ?Vitals:  ? 01/21/22 0900 01/21/22 1100 01/21/22 1130 01/21/22 1227  ?BP: 116/77 116/72 (!) 128/99 125/81  ?Pulse: 75 73 79 88  ?Resp: 12 12 16 17   ?Temp:    98.1 ?F (36.7 ?C)  ?TempSrc:    Oral  ?SpO2: 95% 97% 100% 100%  ?Weight:      ?Height:      ? ? ?Intake/Output Summary (Last 24 hours) at 01/21/2022 1335 ?Last data filed at 01/21/2022 0205 ?Gross per 24 hour  ?Intake 2785.47 ml  ?Output --  ?Net 2785.47 ml  ? ?Filed Weights  ? 01/20/22 1811  ?Weight: 72.6 kg  ? ? ?Examination: ? ?General exam: Appears calm but uncomfortable  ?Respiratory system: Clear to auscultation. Respiratory effort normal. ?Cardiovascular system: S1 & S2 +. No JVD, murmurs, rubs, gallops or clicks.   ?Gastrointestinal system: Abdomen is nondistended, soft and nontender. Normal bowel sounds heard. ?Central nervous system: Alert and oriented. Moves all extremities  ?Psychiatry: Judgement and insight appear normal. Flat mood and affect  ? ? ? ?Data Reviewed: I have personally reviewed following labs and imaging studies ? ?CBC: ?Recent Labs  ?Lab 01/20/22 ?1819  ?WBC 9.0  ?HGB 15.3  ?HCT 44.8  ?MCV 89.6  ?PLT 414*  ? ?Basic Metabolic Panel: ?Recent Labs  ?Lab 01/20/22 ?2143 01/21/22 ?0025 01/21/22 ?0147 01/21/22 ?[id number] 01/21/22 ?[id number]  ?NA 131* 133* 135 138 137  ?K 4.2 4.1 3.3* 3.4* 3.3*  ?CL 96* 101 99 105 103  ?CO2 21* 23 25 27 27   ?GLUCOSE 485* 359* 260* 141* 133*  ?BUN 15 14 14 12 12   ?CREATININE 1.09 1.09 0.99 0.91 0.82  ?CALCIUM 8.5* 8.4* 8.5* 8.4* 8.3*  ? ?GFR: ?Estimated Creatinine Clearance: 125.4 mL/min (by C-G formula based on SCr of 0.82 mg/dL). ?Liver Function Tests: ?Recent Labs  ?Lab 01/21/22 ?0025  ?AST 20  ?ALT 20  ?ALKPHOS 78  ?BILITOT 1.5*  ?PROT 6.0*  ?ALBUMIN 3.1*  ? ?Recent Labs  ?Lab 01/20/22 ?1822  ?LIPASE 2,221*  ? ?No results for input(s): AMMONIA in the last 168 hours. ?Coagulation Profile: ?No results for input(s): INR, PROTIME in the last 168 hours. ?Cardiac Enzymes: ?No results for input(s): CKTOTAL, CKMB, CKMBINDEX, TROPONINI in the last 168 hours. ?BNP (last 3 results) ?  No results for input(s): PROBNP in the last 8760 hours. ?HbA1C: ?No results for input(s): HGBA1C in the last 72 hours. ?CBG: ?Recent Labs  ?Lab 01/21/22 ?0659 01/21/22 ?0803 01/21/22 ?0913 01/21/22 ?1020 01/21/22 ?1259  ?GLUCAP 128* 127* 157* 113* 341*  ? ?Lipid Profile: ?Recent Labs  ?  01/20/22 ?2143  ?CHOL 205*  ?HDL 43  ?LDLCALC 111*  ?TRIG 253*  ?CHOLHDL 4.8  ? ?Thyroid Function Tests: ?Recent Labs  ?  01/20/22 ?2143  ?TSH 90.979*  ?FREET4 0.62  ? ?Anemia Panel: ?No results for input(s): VITAMINB12, FOLATE, FERRITIN, TIBC, IRON, RETICCTPCT in the last 72 hours. ?Sepsis Labs: ?No results for input(s): PROCALCITON,  LATICACIDVEN in the last 168 hours. ? ?No results found for this or any previous visit (from the past 240 hour(s)).  ? ? ? ? ? ?Radiology Studies: ?CT ABDOMEN PELVIS W CONTRAST ? ?Result Date: 01/20/2022 ?CLINICAL DATA:  Acute pancreatitis EXAM: CT ABDOMEN AND PELVIS WITH CONTRAST TECHNIQUE: Multidetector CT imaging of the abdomen and pelvis was performed using the standard protocol following bolus administration of intravenous contrast. RADIATION DOSE REDUCTION: This exam was performed according to the departmental dose-optimization program which includes automated exposure control, adjustment of the mA and/or kV according to patient size and/or use of iterative reconstruction technique. CONTRAST:  133mL OMNIPAQUE IOHEXOL 300 MG/ML  SOLN COMPARISON:  11/30/2021 FINDINGS: Lower chest: Mild right basilar scarring/atelectasis. Hepatobiliary: Liver is within normal limits. Gallbladder is decompressed. No intrahepatic or extrahepatic duct dilatation. Pancreas: Mild peripancreatic inflammatory stranding, reflecting acute pancreatitis. No drainable fluid collection/walled-off necrosis. Parenchymal calcifications along the posterior aspect of the pancreatic head/uncinate process, reflecting sequela of prior/chronic pancreatitis. No pancreatic ductal dilatation. Spleen: Within normal limits. Adrenals/Urinary Tract: Adrenal glands are within normal limits. Heterogeneous perfusion of the kidneys bilaterally, nonspecific but at least raising the possibility of pyelonephritis. No hydronephrosis. Thick-walled bladder. Stomach/Bowel: Stomach is within normal limits. No evidence of bowel obstruction. Normal appendix (series 2/image 46). No colonic wall thickening or inflammatory changes. Vascular/Lymphatic: No evidence of abdominal aortic aneurysm. No suspicious abdominopelvic lymphadenopathy. Reproductive: Prostate is unremarkable. Other: Small volume pelvic ascites. Musculoskeletal: Visualized osseous structures are within normal  limits. IMPRESSION: Acute on chronic pancreatitis.  No evidence of complication. Thick-walled bladder, correlate for cystitis. Heterogeneous perfusion of the kidneys bilaterally, nonspecific, but raising the possibility of pyelonephritis. Electronically Signed   By: Julian Hy M.D.   On: 01/20/2022 23:12  ? ?DG Chest Portable 1 View ? ?Result Date: 01/20/2022 ?CLINICAL DATA:  DKA EXAM: PORTABLE CHEST 1 VIEW COMPARISON:  11/30/2021 FINDINGS: The heart size and mediastinal contours are within normal limits. Both lungs are clear. The visualized skeletal structures are unremarkable. IMPRESSION: No active disease. Electronically Signed   By: Inez Catalina M.D.   On: 01/20/2022 19:01  ? ?US Abdomen Limited RUQ (LIVER/GB) ? ?Result Date: 01/21/2022 ?CLINICAL DATA:  Abdominal pain. EXAM: ULTRASOUND ABDOMEN LIMITED RIGHT UPPER QUADRANT COMPARISON:  Right upper quadrant ultrasound dated 11/05/2021 and CT dated 01/20/2022. FINDINGS: Gallbladder: No gallstones or wall thickening visualized. No sonographic Murphy sign noted by sonographer. Common bile duct: Diameter: 2 mm Liver: No focal lesion identified. Within normal limits in parenchymal echogenicity. Portal vein is patent on color Doppler imaging with normal direction of blood flow towards the liver. Other: None. IMPRESSION: Unremarkable right upper quadrant ultrasound. Electronically Signed   By: Anner Crete M.D.   On: 01/21/2022 00:13   ? ? ? ? ? ?Scheduled Meds: ? FLUoxetine  20 mg Oral Daily  ? gabapentin  300  mg Oral TID  ? heparin  5,000 Units Subcutaneous Q8H  ? insulin aspart  0-5 Units Subcutaneous QHS  ? insulin aspart  0-9 Units Subcutaneous TID WC  ? insulin aspart  4 Units Subcutaneous TID WC  ? insulin detemir  11 Units Subcutaneous BID  ? levothyroxine  150 mcg Oral Q0600  ? ?Continuous Infusions: ? cefTRIAXone (ROCEPHIN)  IV Stopped (01/21/22 0104)  ? dextrose 5% lactated ringers 125 mL/hr at 01/21/22 1302  ? ? ? LOS: 1 day  ? ? ?Time spent: 35 mins   ? ? ? ?Wyvonnia Dusky, MD ?[facility] ?Pager [phone] ? ?If 7PM-7AM, please contact night-coverage ?01/21/2022, 1:35 PM  ? ?

## 2022-01-21 NOTE — Discharge Instructions (Signed)
Please call to get an appointment to establish care with a primary care provider and with an Endocrinologist. ? ?[facility] ?[address] [place], [place], [place] [address] ?Phone: [phone] ? ?[facility] (providers Dr. Carlena Sax and Dr. Verdis Frederickson) ?[address] [place], [place] [address] ?Phone: [phone] ? ?[facility] ?[address], [place], [place] [address] ?Phone: [phone] ?

## 2022-01-21 NOTE — ED Notes (Signed)
Pt complaining of chronic leg pain, pt requests daily gabapentin, MD aware.  ?

## 2022-01-21 NOTE — ED Notes (Signed)
Informed RN bed assigned 

## 2022-01-21 NOTE — ED Notes (Signed)
Endotool notification to consider/contact provider for transition at this time. Docia Furl, NP notified via secure chat ?

## 2022-01-22 DIAGNOSIS — K858 Other acute pancreatitis without necrosis or infection: Secondary | ICD-10-CM | POA: Diagnosis not present

## 2022-01-22 DIAGNOSIS — E1010 Type 1 diabetes mellitus with ketoacidosis without coma: Secondary | ICD-10-CM | POA: Diagnosis not present

## 2022-01-22 DIAGNOSIS — E1065 Type 1 diabetes mellitus with hyperglycemia: Secondary | ICD-10-CM | POA: Diagnosis not present

## 2022-01-22 LAB — CBC
HCT: 35.8 % — ABNORMAL LOW (ref 39.0–52.0)
Hemoglobin: 12.2 g/dL — ABNORMAL LOW (ref 13.0–17.0)
MCH: 29.7 pg (ref 26.0–34.0)
MCHC: 34.1 g/dL (ref 30.0–36.0)
MCV: 87.1 fL (ref 80.0–100.0)
Platelets: 323 10*3/uL (ref 150–400)
RBC: 4.11 MIL/uL — ABNORMAL LOW (ref 4.22–5.81)
RDW: 11.9 % (ref 11.5–15.5)
WBC: 5.1 10*3/uL (ref 4.0–10.5)
nRBC: 0 % (ref 0.0–0.2)

## 2022-01-22 LAB — BASIC METABOLIC PANEL
Anion gap: 9 (ref 5–15)
BUN: 15 mg/dL (ref 6–20)
CO2: 28 mmol/L (ref 22–32)
Calcium: 8.7 mg/dL — ABNORMAL LOW (ref 8.9–10.3)
Chloride: 101 mmol/L (ref 98–111)
Creatinine, Ser: 0.88 mg/dL (ref 0.61–1.24)
GFR, Estimated: >60 mL/min (ref >60)
Glucose, Bld: 285 mg/dL — ABNORMAL HIGH (ref 70–99)
Potassium: 4.1 mmol/L (ref 3.5–5.1)
Sodium: 138 mmol/L (ref 135–145)

## 2022-01-22 LAB — GLUCOSE, CAPILLARY
Glucose-Capillary: 259 mg/dL — ABNORMAL HIGH (ref 70–99)
Glucose-Capillary: 103 mg/dL — ABNORMAL HIGH (ref 70–99)

## 2022-01-22 MED ORDER — OXYCODONE-ACETAMINOPHEN 5-325 MG PO TABS
1.0000 | ORAL_TABLET | ORAL | Status: DC | PRN
  Administered 2022-01-22: 2 via ORAL
  Filled 2022-01-22: qty 2

## 2022-01-22 MED ORDER — INSULIN ASPART 100 UNIT/ML IJ SOLN
7.0000 [IU] | Freq: Three times a day (TID) | INTRAMUSCULAR | Status: DC
  Administered 2022-01-22: 7 [IU] via SUBCUTANEOUS
  Filled 2022-01-22: qty 1

## 2022-01-22 MED ORDER — HYDRALAZINE HCL 50 MG PO TABS: 50.0000 mg | ORAL_TABLET | Freq: Four times a day (QID) | ORAL | Status: DC | PRN

## 2022-01-22 MED ORDER — GLUCOSE BLOOD VI STRP: ORAL_STRIP | 0 refills | Status: DC

## 2022-01-22 MED ORDER — GABAPENTIN 400 MG PO CAPS: 400.0000 mg | ORAL_CAPSULE | Freq: Three times a day (TID) | ORAL | 0 refills | Status: DC

## 2022-01-22 MED ORDER — LEVEMIR FLEXTOUCH 100 UNIT/ML ~~LOC~~ SOPN: 15.0000 [IU] | PEN_INJECTOR | Freq: Two times a day (BID) | SUBCUTANEOUS | 0 refills | Status: DC

## 2022-01-22 MED ORDER — FREESTYLE LIBRE 2 SENSOR MISC
0 refills | Status: DC
Start: 2022-01-22 — End: 2022-03-16

## 2022-01-22 MED ORDER — INSULIN LISPRO (1 UNIT DIAL) 100 UNIT/ML (KWIKPEN): 7.0000 [IU] | PEN_INJECTOR | Freq: Three times a day (TID) | SUBCUTANEOUS | 0 refills | Status: DC

## 2022-01-22 MED ORDER — INSULIN DETEMIR 100 UNIT/ML ~~LOC~~ SOLN
15.0000 [IU] | Freq: Two times a day (BID) | SUBCUTANEOUS | Status: DC
  Administered 2022-01-22: 15 [IU] via SUBCUTANEOUS
  Filled 2022-01-22 (×2): qty 0.15

## 2022-01-22 NOTE — Discharge Summary (Signed)
Physician Discharge Summary  David Ramsey CBJ:[id number] DOB: 1995-12-18 DOA: 01/20/2022  PCP: Patient, No Pcp Per (Inactive)  Admit date: 01/20/2022 Discharge date: 01/22/2022  Admitted From: home  Disposition:  home   Recommendations for Outpatient Follow-up:  Follow up with PCP in 1 week Needs to f/u w/ endocrinology but will likely need a referral from PCP   Home Health: no  Equipment/Devices: no   Discharge Condition: stable  CODE STATUS: full  Diet recommendation: Carb Modified  Brief/Interim Summary: HPI was taken from Dr. Florina Ramsey: David Ramsey is a 26 y.o. male with medical history significant of DM I, n/v/abd pain. Ct abd shows: IMPRESSION: Acute on chronic pancreatitis.  No evidence of complication.  Thick-walled bladder, correlate for cystitis.  Heterogeneous perfusion of the kidneys bilaterally, nonspecific, but raising the possibility of pyelonephritis. D/W pt about his current condition of acute pancreatitis, and DKA, and cystitis.  D/W pt  about npo and plan to identify etiology of pancreatitis.    Review of Systems  Gastrointestinal:  Positive for abdominal pain, nausea and vomiting.  All other systems reviewed and are negative.   As per Dr. Jimmye Ramsey 5/17-5/18/23: Pt presented w/ DKA and was initially put on IV insulin drip. Anion gap closed and pt was weaned off of the insulin drip and started on sq insulin. Of note, pt had significant peripheral neuropathy symptoms so pt's home dose of gabapentin was increased. Pt will need to f/u outpatient w/ endocrinology. Pt verbalized his understanding  Discharge Diagnoses:  Principal Problem:   Nausea & vomiting Active Problems:   Uncontrolled type 1 diabetes mellitus with hyperglycemia, with long-term current use of insulin (HCC)   Acute pancreatitis   Abdominal pain, generalized   Hypothyroidism   DKA (diabetic ketoacidosis) ([place])   UTI (urinary tract infection)  DKA: anion gap is closed. Resolved    DM1: poorly controlled. Continue on levemir, SSI w/ accuchecks    Acute on chronic pancreatitis: as per CT. Continue on IVFs. Much improved    Hypothyroidism: continue on home dose of levothyroxine    R/o UTI: UA was neg. No GU symptoms    Peripheral neuropathy: increased home dose of gabapentin   Discharge Instructions  Discharge Instructions     Diet Carb Modified   Complete by: As directed    Discharge instructions   Complete by: As directed    F/u w/ PCP in 1 week. You will likely need to get a referral from you PCP to see an endocrinologist.   Increase activity slowly   Complete by: As directed       Allergies as of 01/22/2022       Reactions   Tape Rash   Blisters- can use kerlix tape        Medication List     STOP taking these medications    oxyCODONE 5 MG immediate release tablet Commonly known as: Oxy IR/ROXICODONE       TAKE these medications    acetaminophen 500 MG tablet Commonly known as: TYLENOL Take 1,000 mg by mouth every 6 (six) hours as needed for moderate pain or headache.   albuterol 108 (90 Base) MCG/ACT inhaler Commonly known as: VENTOLIN HFA Inhale 2 puffs into the lungs every 6 (six) hours as needed for wheezing or shortness of breath.   blood glucose meter kit and supplies Kit Dispense based on patient and insurance preference. Use up to four times daily as directed.   FLUoxetine 20 MG capsule Commonly known as:  PROZAC Take 1 capsule (20 mg total) by mouth daily.   FreeStyle Libre 2 Sensor Misc Change freestyle libre 2 sensor every 2 weeks x 30 days. No refills   gabapentin 400 MG capsule Commonly known as: Neurontin Take 1 capsule (400 mg total) by mouth 3 (three) times daily. What changed:  medication strength how much to take   glucose blood test strip Use as instructed   insulin lispro 100 UNIT/ML KwikPen Commonly known as: HumaLOG KwikPen Inject 7 Units into the skin with breakfast, with lunch, and with  evening meal. What changed:  how much to take when to take this additional instructions Another medication with the same name was removed. Continue taking this medication, and follow the directions you see here.   ipratropium 0.06 % nasal spray Commonly known as: ATROVENT Place 2 sprays into both nostrils 2 (two) times daily.   Levemir FlexTouch 100 UNIT/ML FlexPen Generic drug: insulin detemir Inject 15 Units into the skin 2 (two) times daily. What changed:  how much to take when to take this   levothyroxine 150 MCG tablet Commonly known as: SYNTHROID Take 1 tablet (150 mcg total) by mouth daily.   Pen Needles 3/16" 31G X 5 MM Misc 1 Container by Does not apply route 4 (four) times daily -  before meals and at bedtime.        Allergies  Allergen Reactions   Tape Rash    Blisters- can use kerlix tape    Consultations:    Procedures/Studies: CT ABDOMEN PELVIS W CONTRAST  Result Date: 01/20/2022 CLINICAL DATA:  Acute pancreatitis EXAM: CT ABDOMEN AND PELVIS WITH CONTRAST TECHNIQUE: Multidetector CT imaging of the abdomen and pelvis was performed using the standard protocol following bolus administration of intravenous contrast. RADIATION DOSE REDUCTION: This exam was performed according to the departmental dose-optimization program which includes automated exposure control, adjustment of the mA and/or kV according to patient size and/or use of iterative reconstruction technique. CONTRAST:  160m OMNIPAQUE IOHEXOL 300 MG/ML  SOLN COMPARISON:  11/30/2021 FINDINGS: Lower chest: Mild right basilar scarring/atelectasis. Hepatobiliary: Liver is within normal limits. Gallbladder is decompressed. No intrahepatic or extrahepatic duct dilatation. Pancreas: Mild peripancreatic inflammatory stranding, reflecting acute pancreatitis. No drainable fluid collection/walled-off necrosis. Parenchymal calcifications along the posterior aspect of the pancreatic head/uncinate process, reflecting  sequela of prior/chronic pancreatitis. No pancreatic ductal dilatation. Spleen: Within normal limits. Adrenals/Urinary Tract: Adrenal glands are within normal limits. Heterogeneous perfusion of the kidneys bilaterally, nonspecific but at least raising the possibility of pyelonephritis. No hydronephrosis. Thick-walled bladder. Stomach/Bowel: Stomach is within normal limits. No evidence of bowel obstruction. Normal appendix (series 2/image 46). No colonic wall thickening or inflammatory changes. Vascular/Lymphatic: No evidence of abdominal aortic aneurysm. No suspicious abdominopelvic lymphadenopathy. Reproductive: Prostate is unremarkable. Other: Small volume pelvic ascites. Musculoskeletal: Visualized osseous structures are within normal limits. IMPRESSION: Acute on chronic pancreatitis.  No evidence of complication. Thick-walled bladder, correlate for cystitis. Heterogeneous perfusion of the kidneys bilaterally, nonspecific, but raising the possibility of pyelonephritis. Electronically Signed   By: SJulian HyM.D.   On: 01/20/2022 23:12   DG Chest Portable 1 View  Result Date: 01/20/2022 CLINICAL DATA:  DKA EXAM: PORTABLE CHEST 1 VIEW COMPARISON:  11/30/2021 FINDINGS: The heart size and mediastinal contours are within normal limits. Both lungs are clear. The visualized skeletal structures are unremarkable. IMPRESSION: No active disease. Electronically Signed   By: MInez CatalinaM.D.   On: 01/20/2022 19:01   UK[place]Abdomen Limited RUQ (LIVER/GB)  Result Date:  01/21/2022 CLINICAL DATA:  Abdominal pain. EXAM: ULTRASOUND ABDOMEN LIMITED RIGHT UPPER QUADRANT COMPARISON:  Right upper quadrant ultrasound dated 11/05/2021 and CT dated 01/20/2022. FINDINGS: Gallbladder: No gallstones or wall thickening visualized. No sonographic Murphy sign noted by sonographer. Common bile duct: Diameter: 2 mm Liver: No focal lesion identified. Within normal limits in parenchymal echogenicity. Portal vein is patent on color  Doppler imaging with normal direction of blood flow towards the liver. Other: None. IMPRESSION: Unremarkable right upper quadrant ultrasound. Electronically Signed   By: Anner Crete M.D.   On: 01/21/2022 00:13   (Echo, Carotid, EGD, Colonoscopy, ERCP)    Subjective: Pt c/o fatigue    Discharge Exam: Vitals:   01/22/22 0730 01/22/22 0911  BP: (!) 136/103 128/87  Pulse: 78 88  Resp: 18   Temp: 98 F (36.7 C)   SpO2: 98%    Vitals:   01/21/22 1949 01/22/22 0545 01/22/22 0730 01/22/22 0911  BP: (!) 131/93 (!) 138/101 (!) 136/103 128/87  Pulse: (!) 101 73 78 88  Resp: 20 18 18    Temp: 98.1 F (36.7 C) 97.7 F (36.5 C) 98 F (36.7 C)   TempSrc: Oral Oral    SpO2: 97% 97% 98%   Weight:      Height:        General: Pt is alert, awake, not in acute distress Cardiovascular: S1/S2 +, no rubs, no gallops Respiratory: CTA bilaterally, no wheezing, no rhonchi Abdominal: Soft, NT, ND, bowel sounds + Extremities: no edema, no cyanosis    The results of significant diagnostics from this hospitalization (including imaging, microbiology, ancillary and laboratory) are listed below for reference.     Microbiology: No results found for this or any previous visit (from the past 240 hour(s)).   Labs: BNP (last 3 results) No results for input(s): BNP in the last 8760 hours. Basic Metabolic Panel: Recent Labs  Lab 01/21/22 0147 01/21/22 0349 01/21/22 0625 01/21/22 1521 01/22/22 0557  NA 135 138 137 132* 138  K 3.3* 3.4* 3.3* 4.9 4.1  CL 99 105 103 97* 101  CO2 25 27 27 25 28   GLUCOSE 260* 141* 133* 484* 285*  BUN 14 12 12 13 15   CREATININE 0.99 0.91 0.82 1.06 0.88  CALCIUM 8.5* 8.4* 8.3* 8.4* 8.7*   Liver Function Tests: Recent Labs  Lab 01/21/22 0025  AST 20  ALT 20  ALKPHOS 78  BILITOT 1.5*  PROT 6.0*  ALBUMIN 3.1*   Recent Labs  Lab 01/20/22 1822  LIPASE 2,221*   No results for input(s): AMMONIA in the last 168 hours. CBC: Recent Labs  Lab  01/20/22 1819 01/22/22 0557  WBC 9.0 5.1  HGB 15.3 12.2*  HCT 44.8 35.8*  MCV 89.6 87.1  PLT 414* 323   Cardiac Enzymes: No results for input(s): CKTOTAL, CKMB, CKMBINDEX, TROPONINI in the last 168 hours. BNP: Invalid input(s): POCBNP CBG: Recent Labs  Lab 01/21/22 1259 01/21/22 1634 01/21/22 1854 01/21/22 2057 01/22/22 0731  GLUCAP 341* 447* 327* 268* 259*   D-Dimer No results for input(s): DDIMER in the last 72 hours. Hgb A1c No results for input(s): HGBA1C in the last 72 hours. Lipid Profile Recent Labs    01/20/22 2143  CHOL 205*  HDL 43  LDLCALC 111*  TRIG 253*  CHOLHDL 4.8   Thyroid function studies Recent Labs    01/20/22 2143  TSH 90.979*   Anemia work up No results for input(s): VITAMINB12, FOLATE, FERRITIN, TIBC, IRON, RETICCTPCT in the last 72 hours. Urinalysis  Component Value Date/Time   COLORURINE COLORLESS (A) 01/20/2022 [place] (A) 01/20/2022 1854   LABSPEC 1.021 01/20/2022 1854   PHURINE 6.0 01/20/2022 1854   GLUCOSEU >=500 (A) 01/20/2022 1854   HGBUR NEGATIVE 01/20/2022 [place] 01/20/2022 1854   KETONESUR 20 (A) 01/20/2022 [place] 01/20/2022 1854   NITRITE NEGATIVE 01/20/2022 1854   LEUKOCYTESUR NEGATIVE 01/20/2022 1854   Sepsis Labs Invalid input(s): PROCALCITONIN,  WBC,  LACTICIDVEN Microbiology No results found for this or any previous visit (from the past 240 hour(s)).   Time coordinating discharge: Over 30 minutes  SIGNED:   Wyvonnia Dusky, MD  [facility] 01/22/2022, 11:18 AM Pager   If 7PM-7AM, please contact night-coverage

## 2022-01-22 NOTE — Progress Notes (Addendum)
Inpatient Diabetes Program Recommendations  AACE/ADA: New Consensus Statement on Inpatient Glycemic Control   Target Ranges:  Prepandial:   less than 140 mg/dL      Peak postprandial:   less than 180 mg/dL (1-2 hours)      Critically ill patients:  140 - 180 mg/dL    Latest Reference Range & Units 01/22/22 05:57  Glucose 70 - 99 mg/dL 616 (H)    Latest Reference Range & Units 01/21/22 08:03 01/21/22 09:13 01/21/22 10:20 01/21/22 12:59 01/21/22 16:34 01/21/22 18:54 01/21/22 20:57  Glucose-Capillary 70 - 99 mg/dL 073 (H) 710 (H) 626 (H) 341 (H) 447 (H) 327 (H) 268 (H)   Review of Glycemic Control  Diabetes history: DM1 (makes NO insulin; requires basal, correction, and carbohydrate coverage insulin) Outpatient Diabetes medications: Levemir 25 units daily, Humalog 1 unit for every 8 grams of carbs, Humalog 1 unit for every 20 mg/dl above target of 948 mg/dl Current orders for Inpatient glycemic control: Levemir 11 units BID, Novolog 0-15 units TID with meals, Novolog 0-5 units QHS, Novolog 4 units TID with meals  Inpatient Diabetes Program Recommendations:    Insulin: Please consider increasing Levemir to 15 units BID and meal coverage to Novolog 7 units TID with meals.  Outpatient DM needs: At time of discharge, please provide Rx for Levemir Flextouch pens [phone]), Humalog Kwikpens [phone] glucometer test strips (#[id number]), and FreeStyle Libre2 sensors [phone]). Thanks, Orlando Penner, RN, MSN, CDE Diabetes Coordinator Inpatient Diabetes Program [phone] (Team Pager from 8am to 5pm)

## 2022-01-22 NOTE — Progress Notes (Signed)
Pt discharged to home. DC instructions given. No concerns voiced. Pt encouraged to establish care and follow with new Provider as soon as possible. Voiced understanding. Pt left unit in wheelchair pushed by hospital volunteer. Left in stable condition.

## 2022-01-22 NOTE — Progress Notes (Signed)
TOC consulted for PCP needs and medication assistance.   CSW has met with patient at bedside prior to discharge.   Patient reports no problems getting his medications, he does endorse needing a PCP and not being established yet. CSW confirmed with patient he has [place], [place] encouraged him to go on [employer] an look at [employer] that are in network with his insurance for his area and schedule a new patient apt.   No further discharge needs noted.   [place], [place]

## 2022-02-16 ENCOUNTER — Ambulatory Visit: Payer: BC Managed Care – PPO | Admitting: Family Medicine

## 2022-02-23 ENCOUNTER — Emergency Department: Payer: BC Managed Care – PPO

## 2022-02-23 ENCOUNTER — Emergency Department
Admission: EM | Admit: 2022-02-23 | Discharge: 2022-02-23 | Disposition: A | Discharge status: Home / Self Care | Payer: BC Managed Care – PPO | Attending: Emergency Medicine | Admitting: Emergency Medicine

## 2022-02-23 ENCOUNTER — Other Ambulatory Visit: Payer: Self-pay

## 2022-02-23 DIAGNOSIS — R109 Unspecified abdominal pain: Secondary | ICD-10-CM | POA: Diagnosis not present

## 2022-02-23 DIAGNOSIS — R0789 Other chest pain: Secondary | ICD-10-CM | POA: Insufficient documentation

## 2022-02-23 DIAGNOSIS — R739 Hyperglycemia, unspecified: Secondary | ICD-10-CM

## 2022-02-23 DIAGNOSIS — R000 Tachycardia, unspecified: Secondary | ICD-10-CM | POA: Insufficient documentation

## 2022-02-23 DIAGNOSIS — R079 Chest pain, unspecified: Secondary | ICD-10-CM | POA: Diagnosis not present

## 2022-02-23 DIAGNOSIS — E1165 Type 2 diabetes mellitus with hyperglycemia: Secondary | ICD-10-CM | POA: Insufficient documentation

## 2022-02-23 DIAGNOSIS — N289 Disorder of kidney and ureter, unspecified: Secondary | ICD-10-CM | POA: Insufficient documentation

## 2022-02-23 DIAGNOSIS — R112 Nausea with vomiting, unspecified: Secondary | ICD-10-CM | POA: Diagnosis not present

## 2022-02-23 DIAGNOSIS — Z794 Long term (current) use of insulin: Secondary | ICD-10-CM | POA: Insufficient documentation

## 2022-02-23 LAB — BASIC METABOLIC PANEL
Anion gap: 5 (ref 5–15)
BUN: 20 mg/dL (ref 6–20)
CO2: 24 mmol/L (ref 22–32)
Calcium: 7.7 mg/dL — ABNORMAL LOW (ref 8.9–10.3)
Chloride: 110 mmol/L (ref 98–111)
Creatinine, Ser: 1.13 mg/dL (ref 0.61–1.24)
GFR, Estimated: >60 mL/min (ref >60)
Glucose, Bld: 230 mg/dL — ABNORMAL HIGH (ref 70–99)
Potassium: 3.3 mmol/L — ABNORMAL LOW (ref 3.5–5.1)
Sodium: 139 mmol/L (ref 135–145)

## 2022-02-23 LAB — COMPREHENSIVE METABOLIC PANEL
ALT: 36 U/L (ref 0–44)
AST: 24 U/L (ref 15–41)
Albumin: 3.9 g/dL (ref 3.5–5.0)
Alkaline Phosphatase: 88 U/L (ref 38–126)
Anion gap: 17 — ABNORMAL HIGH (ref 5–15)
BUN: 27 mg/dL — ABNORMAL HIGH (ref 6–20)
CO2: 19 mmol/L — ABNORMAL LOW (ref 22–32)
Calcium: 9.1 mg/dL (ref 8.9–10.3)
Chloride: 97 mmol/L — ABNORMAL LOW (ref 98–111)
Creatinine, Ser: 1.63 mg/dL — ABNORMAL HIGH (ref 0.61–1.24)
GFR, Estimated: 60 mL/min — ABNORMAL LOW (ref >60)
Glucose, Bld: 498 mg/dL — ABNORMAL HIGH (ref 70–99)
Potassium: 4.2 mmol/L (ref 3.5–5.1)
Sodium: 133 mmol/L — ABNORMAL LOW (ref 135–145)
Total Bilirubin: 2.6 mg/dL — ABNORMAL HIGH (ref 0.3–1.2)
Total Protein: 7 g/dL (ref 6.5–8.1)

## 2022-02-23 LAB — CBC
HCT: 44.5 % (ref 39.0–52.0)
Hemoglobin: 15 g/dL (ref 13.0–17.0)
MCH: 30.1 pg (ref 26.0–34.0)
MCHC: 33.7 g/dL (ref 30.0–36.0)
MCV: 89.2 fL (ref 80.0–100.0)
Platelets: 473 10*3/uL — ABNORMAL HIGH (ref 150–400)
RBC: 4.99 MIL/uL (ref 4.22–5.81)
RDW: 12.3 % (ref 11.5–15.5)
WBC: 7.9 10*3/uL (ref 4.0–10.5)
nRBC: 0 % (ref 0.0–0.2)

## 2022-02-23 LAB — CBG MONITORING, ED
Glucose-Capillary: 522 mg/dL (ref 70–99)
Glucose-Capillary: 375 mg/dL — ABNORMAL HIGH (ref 70–99)

## 2022-02-23 LAB — BLOOD GAS, VENOUS
Acid-Base Excess: 0.6 mmol/L (ref 0.0–2.0)
Bicarbonate: 26.6 mmol/L (ref 20.0–28.0)
O2 Saturation: 57.7 %
Patient temperature: 37
pCO2, Ven: 47 mmHg (ref 44–60)
pH, Ven: 7.36 (ref 7.25–7.43)
pO2, Ven: 32 mmHg (ref 32–45)

## 2022-02-23 LAB — LIPASE, BLOOD: Lipase: 50 U/L (ref 11–51)

## 2022-02-23 LAB — TROPONIN I (HIGH SENSITIVITY)
Troponin I (High Sensitivity): 4 ng/L (ref <18)
Troponin I (High Sensitivity): 4 ng/L (ref <18)

## 2022-02-23 MED ORDER — SODIUM CHLORIDE 0.9 % IV BOLUS
1000.0000 mL | Freq: Once | INTRAVENOUS | Status: AC
Start: 2022-02-23 — End: 2022-02-23
  Administered 2022-02-23: 1000 mL via INTRAVENOUS

## 2022-02-23 MED ORDER — ONDANSETRON HCL 4 MG/2ML IJ SOLN
4.0000 mg | Freq: Once | INTRAMUSCULAR | Status: AC
  Administered 2022-02-23: 4 mg via INTRAVENOUS
  Filled 2022-02-23: qty 2

## 2022-02-23 MED ORDER — SODIUM CHLORIDE 0.9 % IV BOLUS
1000.0000 mL | Freq: Once | INTRAVENOUS | Status: AC
  Administered 2022-02-23: 1000 mL via INTRAVENOUS

## 2022-02-23 MED ORDER — MORPHINE SULFATE (PF) 4 MG/ML IV SOLN
4.0000 mg | Freq: Once | INTRAVENOUS | Status: AC
  Administered 2022-02-23: 4 mg via INTRAVENOUS
  Filled 2022-02-23: qty 1

## 2022-02-23 MED ORDER — ONDANSETRON 4 MG PO TBDP: 4.0000 mg | ORAL_TABLET | Freq: Three times a day (TID) | ORAL | 0 refills | Status: DC | PRN

## 2022-02-23 MED ORDER — HYDROCODONE-ACETAMINOPHEN 5-325 MG PO TABS
1.0000 | ORAL_TABLET | ORAL | 0 refills | Status: DC | PRN
Start: 2022-02-23 — End: 2022-04-14

## 2022-02-23 MED ORDER — INSULIN ASPART 100 UNIT/ML IJ SOLN
10.0000 [IU] | Freq: Once | INTRAMUSCULAR | Status: AC
  Administered 2022-02-23: 10 [IU] via INTRAVENOUS
  Filled 2022-02-23: qty 1

## 2022-02-23 NOTE — ED Triage Notes (Signed)
Pt presents to ER c/o chest pain to left lower side of chest and high blood sugar for a couple days.  Pt states he is T1 Diabetic and his CBG has been >600 at home.  Pt states he had been feeling sick with a cold-symptoms and was not able to control his blood sugar with his insulin which he states has been compliant with.  Pt states chest pain feels similar to his pain he has w/his pancreatitis.  Pt is A&O x4 at this time in NAD in triage, but does appear weak.    CBG in triage 522.

## 2022-02-23 NOTE — ED Provider Notes (Signed)
-----------------------------------------   11:38 PM on 02/23/2022 -----------------------------------------   Anion gap has normalized on repeat BMP.  Patient desires to be discharged home.  Strict return precautions given.  Patient verbalizes understanding and agrees with plan of care.   Irean Hong, MD 02/24/22 [phone]

## 2022-02-23 NOTE — ED Provider Notes (Incomplete)
-----------------------------------------   11:38 PM on 02/23/2022 -----------------------------------------   Anion gap has normalized on repeat BMP.  Patient desires to be discharged home.  Strict return precautions given.  Patient verbalizes understanding and agrees with plan of care.

## 2022-02-23 NOTE — ED Provider Notes (Signed)
[facility] Provider Note    Event Date/Time   First MD Initiated Contact with Patient 02/23/22 2047     (approximate)  History   Chief Complaint: Hyperglycemia and Chest Pain  HPI  David Ramsey is a 26 y.o. male with a past medical history of diabetes, presents to the emergency department for nausea vomiting high blood sugars at home as well as some chest abdominal discomfort.  According to the patient over the past 2 to 3 days he has been experiencing nausea with vomiting vague chest and abdominal discomfort.  Patient states he has been using his insulin as prescribed but his blood sugars have been elevated at home greater than 600 at times.  Patient has had DKA previously and states this feels like he is heading into DKA.  Patient denies any fever.  Does states some slight burning upon urination.  Physical Exam   Triage Vital Signs: ED Triage Vitals  Enc Vitals Group     BP 02/23/22 1928 132/89     Pulse Rate 02/23/22 1928 (!) 121     Resp 02/23/22 1928 (!) 22     Temp 02/23/22 1928 98.2 F (36.8 C)     Temp Source 02/23/22 1928 Oral     SpO2 02/23/22 1928 98 %     Weight 02/23/22 1932 160 lb (72.6 kg)     Height 02/23/22 1932 5\' 6"  (1.676 m)     Head Circumference --      Peak Flow --      Pain Score 02/23/22 1931 8     Pain Loc --      Pain Edu? --      Excl. in GC? --     Most recent vital signs: Vitals:   02/23/22 1928  BP: 132/89  Pulse: (!) 121  Resp: (!) 22  Temp: 98.2 F (36.8 C)  SpO2: 98%    General: Awake, no distress.  CV:  Good peripheral perfusion.  Regular rate and rhythm  Resp:  Normal effort.  Equal breath sounds bilaterally.  Abd:  No distention.  Soft, mild tenderness but no focal tenderness.  No rebound or guarding.   ED Results / Procedures / Treatments   EKG  EKG viewed and interpreted by myself shows sinus tachycardia 119 bpm with a narrow QRS, normal axis, normal intervals, nonspecific but no concerning  ST changes.  RADIOLOGY  I have interpreted the chest x-ray images, no concerning findings on my evaluation. Radiology is read the chest x-ray is negative  MEDICATIONS ORDERED IN ED: Medications  sodium chloride 0.9 % bolus 1,000 mL (has no administration in time range)  sodium chloride 0.9 % bolus 1,000 mL (has no administration in time range)  insulin aspart (novoLOG) injection 10 Units (has no administration in time range)  morphine (PF) 4 MG/ML injection 4 mg (has no administration in time range)  ondansetron (ZOFRAN) injection 4 mg (has no administration in time range)     IMPRESSION / MDM / ASSESSMENT AND PLAN / ED COURSE  I reviewed the triage vital signs and the nursing notes.  Patient's presentation is most consistent with acute presentation with potential threat to life or bodily function.  Patient presents to the emergency department for nausea vomiting abdominal discomfort and high blood sugars.  Patient has a history of diabetes and has a history of DKA in the past.  Patient's work-up thus far shows hyperglycemia with a chemistry glucose of 498, anion gap of 17  indicating possible/borderline DKA.  Mild renal insufficiency as well.  Potassium 4.2.  CBC is normal.  Troponin negative, lipase negative.  Patient's findings are borderline for DKA.  We will IV hydrate with 2 L of normal saline dose 10 units of IV insulin, treat pain and nausea and obtain a urine sample.  We will obtain a VBG to help differentiate whether the patient is truly in DKA.  Otherwise we will IV hydrate dose insulin pain nausea and reassess.  Patient agreeable to plan of care.  Patient states he is feeling much better after IV fluids.  Pulse rate is down.  Patient states the pain is gone.  Patient's VBG came back showing a pH of 7.36.  Patient has since received 2 L of normal saline as well as 10 units of insulin.  We will recheck a blood sugar.  Patient states he is feeling better wishes to go home to continue  drinking fluids.  I discussed with the patient over the next 2 days he needs to drink plenty of nonsugar fluids and stay on top of his insulin administration.  Patient agreeable to plan.  Discussed return precautions if he begins vomiting unable to keep down fluids or if his abdominal pain returns or blood sugar runs high again.  Patient agreeable to plan.  FINAL CLINICAL IMPRESSION(S) / ED DIAGNOSES   Hyperglycemia Nausea vomiting  Note:  This document was prepared using Dragon voice recognition software and may include unintentional dictation errors.

## 2022-02-23 NOTE — Discharge Instructions (Addendum)
Take your medicines daily as directed by your doctor.  Drink plenty of nonsugar he fluids daily.  Return to the ER for worsening symptoms, persistent vomiting, difficulty breathing or other concerns.

## 2022-02-24 ENCOUNTER — Ambulatory Visit: Payer: BC Managed Care – PPO | Admitting: Family Medicine

## 2022-03-16 ENCOUNTER — Ambulatory Visit: Payer: BC Managed Care – PPO | Admitting: Family Medicine

## 2022-03-16 ENCOUNTER — Encounter: Payer: Self-pay | Admitting: Family Medicine

## 2022-03-16 VITALS — BP 125/77 | HR 115 | Ht 66.0 in | Wt 165.0 lb

## 2022-03-16 DIAGNOSIS — E1065 Type 1 diabetes mellitus with hyperglycemia: Secondary | ICD-10-CM | POA: Diagnosis not present

## 2022-03-16 DIAGNOSIS — E038 Other specified hypothyroidism: Secondary | ICD-10-CM | POA: Diagnosis not present

## 2022-03-16 DIAGNOSIS — E063 Autoimmune thyroiditis: Secondary | ICD-10-CM

## 2022-03-16 DIAGNOSIS — F419 Anxiety disorder, unspecified: Secondary | ICD-10-CM

## 2022-03-16 DIAGNOSIS — E1042 Type 1 diabetes mellitus with diabetic polyneuropathy: Secondary | ICD-10-CM | POA: Diagnosis not present

## 2022-03-16 DIAGNOSIS — F32A Depression, unspecified: Secondary | ICD-10-CM

## 2022-03-16 LAB — COMPREHENSIVE METABOLIC PANEL
ALT: 29 U/L (ref 0–53)
AST: 23 U/L (ref 0–37)
Albumin: 4.6 g/dL (ref 3.5–5.2)
Alkaline Phosphatase: 81 U/L (ref 39–117)
BUN: 29 mg/dL — ABNORMAL HIGH (ref 6–23)
CO2: 30 mEq/L (ref 19–32)
Calcium: 10.6 mg/dL — ABNORMAL HIGH (ref 8.4–10.5)
Chloride: 96 mEq/L (ref 96–112)
Creatinine, Ser: 1.32 mg/dL (ref 0.40–1.50)
GFR: 75.36 mL/min (ref >60.00)
Glucose, Bld: 364 mg/dL — ABNORMAL HIGH (ref 70–99)
Potassium: 5.3 mEq/L — ABNORMAL HIGH (ref 3.5–5.1)
Sodium: 136 mEq/L (ref 135–145)
Total Bilirubin: 1.2 mg/dL (ref 0.2–1.2)
Total Protein: 7.4 g/dL (ref 6.0–8.3)

## 2022-03-16 LAB — CBC
HCT: 42.3 % (ref 39.0–52.0)
Hemoglobin: 14.5 g/dL (ref 13.0–17.0)
MCHC: 34.4 g/dL (ref 30.0–36.0)
MCV: 90.5 fl (ref 78.0–100.0)
Platelets: 412 10*3/uL — ABNORMAL HIGH (ref 150.0–400.0)
RBC: 4.67 Mil/uL (ref 4.22–5.81)
RDW: 12.9 % (ref 11.5–15.5)
WBC: 7.8 10*3/uL (ref 4.0–10.5)

## 2022-03-16 LAB — TSH: TSH: 42.88 u[IU]/mL — ABNORMAL HIGH (ref 0.35–5.50)

## 2022-03-16 LAB — POC URINALSYSI DIPSTICK (AUTOMATED)
Bilirubin, UA: NEGATIVE
Blood, UA: NEGATIVE
Glucose, UA: POSITIVE — AB
Ketones, UA: 15
Leukocytes, UA: NEGATIVE
Nitrite, UA: NEGATIVE
Protein, UA: NEGATIVE
Spec Grav, UA: 1.015 (ref 1.010–1.025)
Urobilinogen, UA: 0.2 E.U./dL
pH, UA: 5 (ref 5.0–8.0)

## 2022-03-16 LAB — MICROALBUMIN / CREATININE URINE RATIO
Creatinine,U: 41 mg/dL
Microalb Creat Ratio: 5.1 mg/g (ref 0.0–30.0)
Microalb, Ur: 2.1 mg/dL — ABNORMAL HIGH (ref 0.0–1.9)

## 2022-03-16 LAB — T4, FREE: Free T4: 0.79 ng/dL (ref 0.60–1.60)

## 2022-03-16 LAB — GLUCOSE, POCT (MANUAL RESULT ENTRY): POC Glucose: 368 mg/dl — AB (ref 70–99)

## 2022-03-16 MED ORDER — INSULIN LISPRO (1 UNIT DIAL) 100 UNIT/ML (KWIKPEN): 7.0000 [IU] | PEN_INJECTOR | Freq: Three times a day (TID) | SUBCUTANEOUS | 0 refills | Status: DC

## 2022-03-16 MED ORDER — FLUOXETINE HCL 40 MG PO CAPS: 40.0000 mg | ORAL_CAPSULE | Freq: Every day | ORAL | 3 refills | Status: DC

## 2022-03-16 MED ORDER — LEVEMIR FLEXTOUCH 100 UNIT/ML ~~LOC~~ SOPN: 15.0000 [IU] | PEN_INJECTOR | Freq: Two times a day (BID) | SUBCUTANEOUS | 0 refills | Status: DC

## 2022-03-16 MED ORDER — DEXCOM G6 RECEIVER DEVI: 1.0000 | Freq: Every day | 0 refills | Status: DC

## 2022-03-16 MED ORDER — GABAPENTIN 400 MG PO CAPS: 400.0000 mg | ORAL_CAPSULE | Freq: Three times a day (TID) | ORAL | 0 refills | Status: DC

## 2022-03-16 MED ORDER — DEXCOM G6 SENSOR MISC
1.0000 | Freq: Every day | 0 refills | Status: DC
Start: 2022-03-16 — End: 2022-04-15

## 2022-03-16 NOTE — Patient Instructions (Addendum)
Diabetes: - Start increasing your nighttime insulin (detemir) by 2 units every 3-4 days as long fasting glucose level is higher than 130's and you are not having any hypoglycemic events. Once you get to 40 units daily, maintain and hopefully we can get you in with endocrinology by then. If not, follow-up here as we may need to split into twice daily dosing. Continue with meal time coverage (Humalog).  - Referring to endocrinology - Trying to order you another Dexcom, we will see what insurance does - Neuro referral for diabetic neuropathy pain  Hypothyroidism: - Updating labs today - Continue current levothyroxine for now - Referring to endocrinology   Depression/Anxiety: - Let's try increasing your Prozac - Monitor for any signs of suicidal thoughts and seek help immediately if these thoughts develop - Referring to behavioral health for med management and counseling  [place] [facility] - Urgent care services - Outpatient services: Individual Therapy Partial Hospitalization Program (PHP) Substance Abuse Intensive Outpatient Program [facility]) Specialized Intensive Adult Group Therapy Medication Management Peer Living Room  Phone: [phone]  Address: [address] [place], [place] [address]  Hours: Open 24/7, No appointment required.  -------------------------------------------------  Thank you for choosing [facility] for your Primary Care needs. I am excited for the opportunity to partner with you to meet your health care goals. It was a pleasure meeting you today!   Information on diet, exercise, and health maintenance recommendations are listed below. This is information to help you be sure you are on track for optimal health and monitoring.   Please look over this and let us know if you have any questions or if you have completed any of the health maintenance outside of [facility] so that we can be sure  your records are up to date.  ___________________________________________________________  MyChart:  For all urgent or time sensitive needs we ask that you please call the office to avoid delays. Our number is [phone]. MyChart is not constantly monitored and due to the large volume of messages a day, replies may take up to 72 business hours.  MyChart Policy: MyChart allows for you to see your visit notes, after visit summary, provider recommendations, lab and tests results, make an appointment, request refills, and contact your provider or the office for non-urgent questions or concerns. Providers are seeing patients during normal business hours and do not have built in time to review MyChart messages.  We ask that you allow a minimum of 3 business days for responses to [employer]. For this reason, please do not send urgent requests through MyChart. Please call the office at [phone]. New and ongoing conditions may require a visit. We have virtual and in-person visits available for your convenience.  Complex MyChart concerns may require a visit. Your provider may request you schedule a virtual or in-person visit to ensure we are providing the best care possible. MyChart messages sent after 11:00 AM on Friday will not be received by the provider until Monday morning.    Lab and Test Results: You will receive your lab and test results on MyChart as soon as they are completed and results have been sent by the lab or testing facility. Due to this service, you will receive your results BEFORE your provider.  I review lab and test results each morning prior to seeing patients. Some results require collaboration with other providers to ensure you are receiving the most appropriate care. For this  reason, we ask that you please allow a minimum of 3-5 business days from the time that ALL results have been received for your provider to receive and review lab and test results and contact you  about these.  Most lab and test result comments from the provider will be sent through MyChart. Your provider may recommend changes to the plan of care, follow-up visits, repeat testing, ask questions, or request an office visit to discuss these results. You may reply directly to this message or call the office to provide information for the provider or set up an appointment. In some instances, you will be called with test results and recommendations. Please let us know if this is preferred and we will make note of this in your chart to provide this for you.    If you have not heard a response to your lab or test results in 5 business days from all results returning to MyChart, please call the office to let us know. We ask that you please avoid calling prior to this time unless there is an emergent concern. Due to high call volumes, this can delay the resulting process.  After Hours: For all non-emergency after hours needs, please call the office at [phone] and select the option to reach the on-call  service. On-call services are shared between multiple [facility] offices and therefore it will not be possible to speak directly with your provider. On-call providers may provide medical advice and recommendations, but are unable to provide refills for maintenance medications.  For all emergency or urgent medical needs after normal business hours, we recommend that you seek care at the closest Urgent Care or Emergency Department to ensure appropriate treatment in a timely manner.  [facility] [place] has a 24 hour emergency room located on the ground floor for your convenience.   Urgent Concerns During the Business Day Providers are seeing patients from 8AM to 5PM with a busy schedule and are most often not able to respond to non-urgent calls until the end of the day or the next business day. If you should have URGENT concerns during the day, please call and speak to the nurse or  schedule a same day appointment so that we can address your concern without delay.   Thank you, again, for choosing me as your health care partner. I appreciate your trust and look forward to learning more about you.   Lollie Marrow Reola Calkins, DNP, FNP-C  ___________________________________________________________  Health Maintenance Recommendations Screening Testing Mammogram Every 1-2 years based on history and risk factors Starting at age 23 Pap Smear Ages 21-39 every 3 years Ages 64-65 every 5 years with HPV testing More frequent testing may be required based on results and history Colon Cancer Screening Every 1-10 years based on test performed, risk factors, and history Starting at age 16 Bone Density Screening Every 2-10 years based on history Starting at age 46 for women Recommendations for men differ based on medication usage, history, and risk factors AAA Screening One time ultrasound Men 45-50 years old who have ever smoked Lung Cancer Screening Low Dose Lung CT every 12 months Age 43-80 years with a 20 pack-year smoking history who still smoke or who have quit within the last 15 years  Screening Labs Routine  Labs: Complete Blood Count (CBC), Complete Metabolic Panel (CMP), Cholesterol (Lipid Panel) Every 6-12 months based on history and medications May be recommended more frequently based on current conditions or previous results Hemoglobin A1c Lab Every 3-12  months based on history and previous results Starting at age 47 or earlier with diagnosis of diabetes, high cholesterol, BMI >26, and/or risk factors Frequent monitoring for patients with diabetes to ensure blood sugar control Thyroid Panel (TSH w/ T3 & T4) Every 6 months based on history, symptoms, and risk factors May be repeated more often if on medication HIV One time testing for all patients 66 and older May be repeated more frequently for patients with increased risk factors or exposure Hepatitis C One time  testing for all patients 38 and older May be repeated more frequently for patients with increased risk factors or exposure Gonorrhea, Chlamydia Every 12 months for all sexually active persons 13-24 years Additional monitoring may be recommended for those who are considered high risk or who have symptoms PSA Men 68-42 years old with risk factors Additional screening may be recommended from age 22-69 based on risk factors, symptoms, and history  Vaccine Recommendations Tetanus Booster All adults every 10 years Flu Vaccine All patients 6 months and older every year COVID Vaccine All patients 12 years and older Initial dosing with booster May recommend additional booster based on age and health history HPV Vaccine 2 doses all patients age 56-26 Dosing may be considered for patients over 26 Shingles Vaccine (Shingrix) 2 doses all adults 50 years and older Pneumonia (Pneumovax 23) All adults 65 years and older May recommend earlier dosing based on health history Pneumonia (Prevnar 39) All adults 65 years and older Dosed 1 year after Pneumovax 23 Pneumonia (Prevnar 20) All adults 65 years and older (adults 19-64 with certain conditions or risk factors) 1 dose  For those who have no received Prevnar 13 vaccine previously   Additional Screening, Testing, and Vaccinations may be recommended on an individualized basis based on family history, health history, risk factors, and/or exposure.  __________________________________________________________  Diet Recommendations for All Patients  I recommend that all patients maintain a diet low in saturated fats, carbohydrates, and cholesterol. While this can be challenging at first, it is not impossible and small changes can make big differences.  Things to try: Decreasing the amount of soda, sweet tea, and/or juice to one or less per day and replace with water While water is always the first choice, if you do not like water you may  consider adding a water additive without sugar to improve the taste other sugar free drinks Replace potatoes with a brightly colored vegetable at dinner Use healthy oils, such as canola oil or olive oil, instead of butter or hard margarine Limit your bread intake to two pieces or less a day Replace regular pasta with low carb pasta options Bake, broil, or grill foods instead of frying Monitor portion sizes  Eat smaller, more frequent meals throughout the day instead of large meals  An important thing to remember is, if you love foods that are not great for your health, you don't have to give them up completely. Instead, allow these foods to be a reward when you have done well. Allowing yourself to still have special treats every once in a while is a nice way to tell yourself thank you for working hard to keep yourself healthy.   Also remember that every day is a new day. If you have a bad day and "fall off the wagon", you can still climb right back up and keep moving along on your journey!  We have resources available to help you!  Some websites that may be helpful include: www.http://www.wall-moore.info/  Www.VeryWellFit.com  _____________________________________________________________  Activity Recommendations for All Patients  I recommend that all adults get at least 20 minutes of moderate physical activity that elevates your heart rate at least 5 days out of the week.  Some examples include: Walking or jogging at a pace that allows you to carry on a conversation Cycling (stationary bike or outdoors) Water aerobics Yoga Weight lifting Dancing If physical limitations prevent you from putting stress on your joints, exercise in a pool or seated in a chair are excellent options.  Do determine your MAXIMUM heart rate for activity: 220 - YOUR AGE = MAX Heart Rate   Remember! Do not push yourself too hard.  Start slowly and build up your pace, speed, weight, time in exercise, etc.  Allow your body  to rest between exercise and get good sleep. You will need more water than normal when you are exerting yourself. Do not wait until you are thirsty to drink. Drink with a purpose of getting in at least 8, 8 ounce glasses of water a day plus more depending on how much you exercise and sweat.    If you begin to develop dizziness, chest pain, abdominal pain, jaw pain, shortness of breath, headache, vision changes, lightheadedness, or other concerning symptoms, stop the activity and allow your body to rest. If your symptoms are severe, seek emergency evaluation immediately. If your symptoms are concerning, but not severe, please let us know so that we can recommend further evaluation.

## 2022-03-16 NOTE — Progress Notes (Signed)
New Patient Office Visit  Subjective    Patient ID: David Ramsey, male    DOB: June 22, 1996  Age: 26 y.o. MRN: [id number]  CC: establish care   HPI [name] presents to establish care. He has not had PCP recently.     Type 1 Diabetes:  - Meds: Insulin detemir 25 units daily, Humalog with meals, carb counting  - He does not have an insulin pump - He was in the hospital 2 months ago with pancreatitis, DKA,  - He hasn't seen endocrinology in years. Moved back here from [place] and hasn't found anyone yet.  - Glucose has not been well controlled - states fasting can be in the 600s at times - last DM eye exam was 4-5 months ago - Reports significant diabetic neuropathy      Hypothyroidism/Hashimoto's: - Levothyroxine 150 mcg daily  - Constantly feels horrible: fatigue (feels like he has to run off of energy drinks), diabetic neuropathy, constant pain,    Depression/Anxiety: - Prozac 20 mg daily (doesn't feel like that is working well) - would like referral for counseling and med management  - No SI/HI or active plans    Generalized pain/Neuropathy: - Gabapentin 400 mg TID, did Norco short term after recent ED visit   - mostly abdominal and lower extremity, if blood sugar spikes he feels like he becomes paralyzed from the worsening neuropathy         03/16/2022   11:53 AM  PHQ9 SCORE ONLY  PHQ-9 Total Score 24      03/16/2022   11:53 AM  GAD 7 : Generalized Anxiety Score  Nervous, Anxious, on Edge 2  Control/stop worrying 3  Worry too much - different things 3  Trouble relaxing 3  Restless 3  Easily annoyed or irritable 3  Afraid - awful might happen 2  Total GAD 7 Score 19  Anxiety Difficulty Extremely difficult       Outpatient Encounter Medications as of 03/16/2022  Medication Sig   Continuous Blood Gluc Receiver (DEXCOM G6 RECEIVER) DEVI 1 each by Does not apply route daily.   Continuous Blood Gluc Sensor (DEXCOM G6 SENSOR) MISC 1 each by  Does not apply route daily.   albuterol (VENTOLIN HFA) 108 (90 Base) MCG/ACT inhaler Inhale 2 puffs into the lungs every 6 (six) hours as needed for wheezing or shortness of breath.   blood glucose meter kit and supplies KIT Dispense based on patient and insurance preference. Use up to four times daily as directed.   FLUoxetine (PROZAC) 40 MG capsule Take 1 capsule (40 mg total) by mouth daily.   gabapentin (NEURONTIN) 400 MG capsule Take 1 capsule (400 mg total) by mouth 3 (three) times daily.   glucose blood test strip Use as instructed   HYDROcodone-acetaminophen (NORCO/VICODIN) 5-325 MG tablet Take 1 tablet by mouth every 4 (four) hours as needed for moderate pain.   insulin detemir (LEVEMIR FLEXTOUCH) 100 UNIT/ML FlexPen Inject 15 Units into the skin 2 (two) times daily.   insulin lispro (HUMALOG KWIKPEN) 100 UNIT/ML KwikPen Inject 7 Units into the skin with breakfast, with lunch, and with evening meal.   levothyroxine (SYNTHROID) 150 MCG tablet Take 1 tablet (150 mcg total) by mouth daily.   ondansetron (ZOFRAN-ODT) 4 MG disintegrating tablet Take 1 tablet (4 mg total) by mouth every 8 (eight) hours as needed for nausea or vomiting.   [DISCONTINUED] acetaminophen (TYLENOL) 500 MG tablet Take 1,000 mg by mouth every 6 (six) hours as  needed for moderate pain or headache.   [DISCONTINUED] Continuous Blood Gluc Sensor (FREESTYLE LIBRE 2 SENSOR) MISC Change freestyle libre 2 sensor every 2 weeks x 30 days. No refills   [DISCONTINUED] FLUoxetine (PROZAC) 20 MG capsule Take 1 capsule (20 mg total) by mouth daily.   [DISCONTINUED] gabapentin (NEURONTIN) 400 MG capsule Take 1 capsule (400 mg total) by mouth 3 (three) times daily.   [DISCONTINUED] insulin detemir (LEVEMIR FLEXTOUCH) 100 UNIT/ML FlexPen Inject 15 Units into the skin 2 (two) times daily.   [DISCONTINUED] insulin lispro (HUMALOG KWIKPEN) 100 UNIT/ML KwikPen Inject 7 Units into the skin with breakfast, with lunch, and with evening meal.    [DISCONTINUED] Insulin Pen Needle (PEN NEEDLES 3/16") 31G X 5 MM MISC 1 Container by Does not apply route 4 (four) times daily -  before meals and at bedtime.   [DISCONTINUED] ipratropium (ATROVENT) 0.06 % nasal spray Place 2 sprays into both nostrils 2 (two) times daily. (Patient not taking: Reported on 01/20/2022)   [DISCONTINUED] pantoprazole (PROTONIX) 40 MG tablet Take 1 tablet (40 mg total) by mouth daily.   No facility-administered encounter medications on file as of 03/16/2022.    Past Medical History:  Diagnosis Date   AKI (acute kidney injury) ([facility]) 12/16/2016   Diabetes mellitus without complication ([facility])    INSULIN DEPENDENT   SINCE AGE 62   DKA (diabetic ketoacidoses) 02/17/2017   Hashimoto's disease    MRSA infection    Pneumonia    Liver contusion, lung effusion, sepsis multiorgan failure, no clear source    Past Surgical History:  Procedure Laterality Date   CHEST TUBE INSERTION      Family History  Problem Relation Age of Onset   Diabetes Cousin    Heart disease Other    Cancer Other    Thyroid disease Other    Healthy Mother     Social History   Socioeconomic History   Marital status: Married    Spouse name: Not on file   Number of children: Not on file   Years of education: Not on file   Highest education level: Not on file  Occupational History   Not on file  Tobacco Use   Smoking status: Former    Packs/day: 0.50    Years: 4.00    Total pack years: 2.00    Types: Cigarettes    Quit date: 05/23/2017    Years since quitting: 4.8   Smokeless tobacco: Never  Vaping Use   Vaping Use: Every day   Substances: Nicotine, Flavoring  Substance and Sexual Activity   Alcohol use: Yes    Comment: occ   Drug use: No   Sexual activity: Yes  Other Topics Concern   Not on file  Social History Narrative   Not on file   Social Determinants of Health   Financial Resource Strain: Not on file  Food Insecurity: Not on file  Transportation Needs: Not on  file  Physical Activity: Not on file  Stress: Not on file  Social Connections: Not on file  Intimate Partner Violence: Not on file    ROS All review of systems negative except what is listed in the HPI      Objective    BP 125/77   Pulse (!) 115   Ht 5' 6"  (1.676 m)   Wt 165 lb (74.8 kg)   BMI 26.63 kg/m   Physical Exam Vitals reviewed.  Constitutional:      Appearance: Normal appearance.  Cardiovascular:  Rate and Rhythm: Normal rate and regular rhythm.  Pulmonary:     Effort: Pulmonary effort is normal.     Breath sounds: Normal breath sounds.  Musculoskeletal:     Cervical back: Normal range of motion and neck supple. No rigidity or tenderness.     Right lower leg: No edema.     Left lower leg: No edema.  Lymphadenopathy:     Cervical: No cervical adenopathy.  Skin:    General: Skin is warm and dry.  Neurological:     General: No focal deficit present.     Mental Status: He is alert and oriented to person, place, and time. Mental status is at baseline.  Psychiatric:        Mood and Affect: Mood normal.        Behavior: Behavior normal.        Thought Content: Thought content normal.        Judgment: Judgment normal.         Assessment & Plan:   Problem List Items Addressed This Visit       Endocrine   Hypothyroidism    - Updating labs today - Continue current levothyroxine for now - Referring to endocrinology       Relevant Orders   T4, free (Completed)   TSH (Completed)   Uncontrolled type 1 diabetes mellitus with hyperglycemia, with long-term current use of insulin ([place]) - Primary    - Start increasing your nighttime insulin (detemir) by 2 units every 3-4 days as long fasting glucose level is higher than 130's and you are not having any hypoglycemic events. Once you get to 40 units daily, maintain and hopefully we can get you in with endocrinology by then. If not, follow-up here as we may need to split into twice daily dosing. Continue with  meal time coverage (Humalog).  - Referring to endocrinology - Trying to order you another Dexcom, we will see what insurance does - Neuro referral for diabetic neuropathy pain      Relevant Medications   Continuous Blood Gluc Sensor (DEXCOM G6 SENSOR) MISC   Continuous Blood Gluc Receiver (DEXCOM G6 RECEIVER) DEVI   insulin detemir (LEVEMIR FLEXTOUCH) 100 UNIT/ML FlexPen   insulin lispro (HUMALOG KWIKPEN) 100 UNIT/ML KwikPen   Other Relevant Orders   Ambulatory referral to Endocrinology   Ambulatory referral to Neurology   Microalbumin / creatinine urine ratio   CBC (Completed)   Comprehensive metabolic panel (Completed)   POCT Urinalysis Dipstick (Automated) (Completed)   POCT Glucose (CBG) (Completed)   Diabetic polyneuropathy associated with type 1 diabetes mellitus (HCC)   Relevant Medications   FLUoxetine (PROZAC) 40 MG capsule   gabapentin (NEURONTIN) 400 MG capsule   insulin detemir (LEVEMIR FLEXTOUCH) 100 UNIT/ML FlexPen   insulin lispro (HUMALOG KWIKPEN) 100 UNIT/ML KwikPen   Other Relevant Orders   Ambulatory referral to Neurology     Other   Anxiety and depression    - Let's try increasing your Prozac - Monitor for any signs of suicidal thoughts and seek help immediately if these thoughts develop - Referring to behavioral health for med management and counseling - Information provided for [facility] urgent care - No SI/HI today.      Relevant Medications   FLUoxetine (PROZAC) 40 MG capsule   Other Relevant Orders   Ambulatory referral to [place]    Return for pending results and referrals; if you cannot get in with endo in the next month, f/u here in 1  month.   Terrilyn Saver, NP

## 2022-03-16 NOTE — Assessment & Plan Note (Signed)
-   Updating labs today - Continue current levothyroxine for now - Referring to endocrinology

## 2022-03-16 NOTE — Assessment & Plan Note (Signed)
-   Let's try increasing your Prozac - Monitor for any signs of suicidal thoughts and seek help immediately if these thoughts develop - Referring to behavioral health for med management and counseling - Information provided for [facility] urgent care - No SI/HI today.

## 2022-03-16 NOTE — Assessment & Plan Note (Signed)
-   Start increasing your nighttime insulin (detemir) by 2 units every 3-4 days as long fasting glucose level is higher than 130's and you are not having any hypoglycemic events. Once you get to 40 units daily, maintain and hopefully we can get you in with endocrinology by then. If not, follow-up here as we may need to split into twice daily dosing. Continue with meal time coverage (Humalog).  - Referring to endocrinology - Trying to order you another Dexcom, we will see what insurance does - Neuro referral for diabetic neuropathy pain

## 2022-03-17 MED ORDER — LISINOPRIL 5 MG PO TABS: 5.0000 mg | ORAL_TABLET | Freq: Every day | ORAL | 3 refills | Status: DC

## 2022-03-17 MED ORDER — LEVOTHYROXINE SODIUM 175 MCG PO TABS: 175.0000 ug | ORAL_TABLET | Freq: Every day | ORAL | 2 refills | Status: DC

## 2022-03-17 NOTE — Addendum Note (Signed)
Addended by: Hyman Hopes B on: 03/17/2022 12:57 PM   Modules accepted: Orders

## 2022-03-20 ENCOUNTER — Other Ambulatory Visit: Payer: Self-pay

## 2022-03-20 ENCOUNTER — Encounter: Payer: Self-pay | Admitting: *Deleted

## 2022-03-20 ENCOUNTER — Observation Stay
Admission: EM | Admit: 2022-03-20 | Discharge: 2022-03-22 | Disposition: A | Discharge status: Home / Self Care | Payer: BC Managed Care – PPO | Attending: Internal Medicine | Admitting: Internal Medicine

## 2022-03-20 ENCOUNTER — Emergency Department: Payer: BC Managed Care – PPO

## 2022-03-20 DIAGNOSIS — E860 Dehydration: Secondary | ICD-10-CM | POA: Diagnosis present

## 2022-03-20 DIAGNOSIS — Z8719 Personal history of other diseases of the digestive system: Secondary | ICD-10-CM | POA: Diagnosis not present

## 2022-03-20 DIAGNOSIS — N179 Acute kidney failure, unspecified: Secondary | ICD-10-CM | POA: Diagnosis present

## 2022-03-20 DIAGNOSIS — Z833 Family history of diabetes mellitus: Secondary | ICD-10-CM

## 2022-03-20 DIAGNOSIS — I129 Hypertensive chronic kidney disease with stage 1 through stage 4 chronic kidney disease, or unspecified chronic kidney disease: Secondary | ICD-10-CM | POA: Diagnosis present

## 2022-03-20 DIAGNOSIS — R0602 Shortness of breath: Secondary | ICD-10-CM | POA: Diagnosis not present

## 2022-03-20 DIAGNOSIS — Z91120 Patient's intentional underdosing of medication regimen due to financial hardship: Secondary | ICD-10-CM | POA: Diagnosis not present

## 2022-03-20 DIAGNOSIS — E1040 Type 1 diabetes mellitus with diabetic neuropathy, unspecified: Secondary | ICD-10-CM | POA: Diagnosis present

## 2022-03-20 DIAGNOSIS — I10 Essential (primary) hypertension: Secondary | ICD-10-CM | POA: Diagnosis present

## 2022-03-20 DIAGNOSIS — D75839 Thrombocytosis, unspecified: Secondary | ICD-10-CM | POA: Diagnosis present

## 2022-03-20 DIAGNOSIS — E663 Overweight: Secondary | ICD-10-CM | POA: Diagnosis not present

## 2022-03-20 DIAGNOSIS — E1022 Type 1 diabetes mellitus with diabetic chronic kidney disease: Secondary | ICD-10-CM | POA: Diagnosis present

## 2022-03-20 DIAGNOSIS — Z794 Long term (current) use of insulin: Secondary | ICD-10-CM

## 2022-03-20 DIAGNOSIS — N182 Chronic kidney disease, stage 2 (mild): Secondary | ICD-10-CM | POA: Diagnosis present

## 2022-03-20 DIAGNOSIS — F329 Major depressive disorder, single episode, unspecified: Secondary | ICD-10-CM | POA: Diagnosis not present

## 2022-03-20 DIAGNOSIS — Z87891 Personal history of nicotine dependence: Secondary | ICD-10-CM | POA: Diagnosis not present

## 2022-03-20 DIAGNOSIS — T383X6A Underdosing of insulin and oral hypoglycemic [antidiabetic] drugs, initial encounter: Secondary | ICD-10-CM | POA: Diagnosis present

## 2022-03-20 DIAGNOSIS — Z79899 Other long term (current) drug therapy: Secondary | ICD-10-CM

## 2022-03-20 DIAGNOSIS — Z87440 Personal history of urinary (tract) infections: Secondary | ICD-10-CM

## 2022-03-20 DIAGNOSIS — E1065 Type 1 diabetes mellitus with hyperglycemia: Principal | ICD-10-CM

## 2022-03-20 DIAGNOSIS — R300 Dysuria: Secondary | ICD-10-CM | POA: Diagnosis present

## 2022-03-20 DIAGNOSIS — R000 Tachycardia, unspecified: Secondary | ICD-10-CM | POA: Diagnosis not present

## 2022-03-20 DIAGNOSIS — R112 Nausea with vomiting, unspecified: Secondary | ICD-10-CM

## 2022-03-20 DIAGNOSIS — E038 Other specified hypothyroidism: Secondary | ICD-10-CM | POA: Diagnosis present

## 2022-03-20 DIAGNOSIS — K861 Other chronic pancreatitis: Secondary | ICD-10-CM | POA: Diagnosis present

## 2022-03-20 DIAGNOSIS — Z8614 Personal history of Methicillin resistant Staphylococcus aureus infection: Secondary | ICD-10-CM | POA: Diagnosis not present

## 2022-03-20 DIAGNOSIS — E1010 Type 1 diabetes mellitus with ketoacidosis without coma: Secondary | ICD-10-CM | POA: Diagnosis not present

## 2022-03-20 DIAGNOSIS — R0600 Dyspnea, unspecified: Secondary | ICD-10-CM | POA: Diagnosis present

## 2022-03-20 DIAGNOSIS — R0789 Other chest pain: Secondary | ICD-10-CM | POA: Diagnosis present

## 2022-03-20 DIAGNOSIS — E063 Autoimmune thyroiditis: Secondary | ICD-10-CM | POA: Diagnosis not present

## 2022-03-20 DIAGNOSIS — T381X6A Underdosing of thyroid hormones and substitutes, initial encounter: Secondary | ICD-10-CM | POA: Diagnosis present

## 2022-03-20 DIAGNOSIS — F32A Depression, unspecified: Secondary | ICD-10-CM | POA: Diagnosis present

## 2022-03-20 DIAGNOSIS — F419 Anxiety disorder, unspecified: Secondary | ICD-10-CM

## 2022-03-20 DIAGNOSIS — E039 Hypothyroidism, unspecified: Secondary | ICD-10-CM | POA: Diagnosis not present

## 2022-03-20 LAB — BASIC METABOLIC PANEL
Anion gap: 24 — ABNORMAL HIGH (ref 5–15)
Anion gap: 19 — ABNORMAL HIGH (ref 5–15)
Anion gap: 10 (ref 5–15)
BUN: 21 mg/dL — ABNORMAL HIGH (ref 6–20)
BUN: 19 mg/dL (ref 6–20)
BUN: 17 mg/dL (ref 6–20)
CO2: 18 mmol/L — ABNORMAL LOW (ref 22–32)
CO2: 16 mmol/L — ABNORMAL LOW (ref 22–32)
CO2: 24 mmol/L (ref 22–32)
Calcium: 9.6 mg/dL (ref 8.9–10.3)
Calcium: 8.9 mg/dL (ref 8.9–10.3)
Calcium: 9.2 mg/dL (ref 8.9–10.3)
Chloride: 92 mmol/L — ABNORMAL LOW (ref 98–111)
Chloride: 99 mmol/L (ref 98–111)
Chloride: 103 mmol/L (ref 98–111)
Creatinine, Ser: 1.62 mg/dL — ABNORMAL HIGH (ref 0.61–1.24)
Creatinine, Ser: 1.34 mg/dL — ABNORMAL HIGH (ref 0.61–1.24)
Creatinine, Ser: 1.25 mg/dL — ABNORMAL HIGH (ref 0.61–1.24)
GFR, Estimated: >60 mL/min (ref >60)
GFR, Estimated: >60 mL/min (ref >60)
GFR, Estimated: >60 mL/min (ref >60)
Glucose, Bld: 662 mg/dL (ref 70–99)
Glucose, Bld: 473 mg/dL — ABNORMAL HIGH (ref 70–99)
Glucose, Bld: 193 mg/dL — ABNORMAL HIGH (ref 70–99)
Potassium: 5.1 mmol/L (ref 3.5–5.1)
Potassium: 4.2 mmol/L (ref 3.5–5.1)
Potassium: 4.4 mmol/L (ref 3.5–5.1)
Sodium: 134 mmol/L — ABNORMAL LOW (ref 135–145)
Sodium: 134 mmol/L — ABNORMAL LOW (ref 135–145)
Sodium: 137 mmol/L (ref 135–145)

## 2022-03-20 LAB — BLOOD GAS, VENOUS
Acid-base deficit: 11.1 mmol/L — ABNORMAL HIGH (ref 0.0–2.0)
Bicarbonate: 15.4 mmol/L — ABNORMAL LOW (ref 20.0–28.0)
O2 Saturation: 85 %
Patient temperature: 37
pCO2, Ven: 36 mmHg — ABNORMAL LOW (ref 44–60)
pH, Ven: 7.24 — ABNORMAL LOW (ref 7.25–7.43)
pO2, Ven: 56 mmHg — ABNORMAL HIGH (ref 32–45)

## 2022-03-20 LAB — CBC
HCT: 45.5 % (ref 39.0–52.0)
Hemoglobin: 15 g/dL (ref 13.0–17.0)
MCH: 29.9 pg (ref 26.0–34.0)
MCHC: 33 g/dL (ref 30.0–36.0)
MCV: 90.6 fL (ref 80.0–100.0)
Platelets: 475 10*3/uL — ABNORMAL HIGH (ref 150–400)
RBC: 5.02 MIL/uL (ref 4.22–5.81)
RDW: 12.2 % (ref 11.5–15.5)
WBC: 8.6 10*3/uL (ref 4.0–10.5)
nRBC: 0 % (ref 0.0–0.2)

## 2022-03-20 LAB — HEPATIC FUNCTION PANEL
ALT: 31 U/L (ref 0–44)
AST: 24 U/L (ref 15–41)
Albumin: 4.6 g/dL (ref 3.5–5.0)
Alkaline Phosphatase: 85 U/L (ref 38–126)
Bilirubin, Direct: 0.2 mg/dL (ref 0.0–0.2)
Indirect Bilirubin: 2.5 mg/dL — ABNORMAL HIGH (ref 0.3–0.9)
Total Bilirubin: 2.7 mg/dL — ABNORMAL HIGH (ref 0.3–1.2)
Total Protein: 8.2 g/dL — ABNORMAL HIGH (ref 6.5–8.1)

## 2022-03-20 LAB — URINALYSIS, ROUTINE W REFLEX MICROSCOPIC
Bacteria, UA: NONE SEEN
Bilirubin Urine: NEGATIVE
Glucose, UA: >=500 mg/dL — AB
Hgb urine dipstick: NEGATIVE
Ketones, ur: 80 mg/dL — AB
Leukocytes,Ua: NEGATIVE
Nitrite: NEGATIVE
Protein, ur: NEGATIVE mg/dL
Specific Gravity, Urine: 1.024 (ref 1.005–1.030)
Squamous Epithelial / HPF: NONE SEEN (ref 0–5)
pH: 5 (ref 5.0–8.0)

## 2022-03-20 LAB — CBG MONITORING, ED
Glucose-Capillary: >600 mg/dL (ref 70–99)
Glucose-Capillary: 587 mg/dL (ref 70–99)
Glucose-Capillary: 517 mg/dL (ref 70–99)
Glucose-Capillary: 349 mg/dL — ABNORMAL HIGH (ref 70–99)
Glucose-Capillary: 443 mg/dL — ABNORMAL HIGH (ref 70–99)
Glucose-Capillary: 259 mg/dL — ABNORMAL HIGH (ref 70–99)

## 2022-03-20 LAB — MAGNESIUM: Magnesium: 2.5 mg/dL — ABNORMAL HIGH (ref 1.7–2.4)

## 2022-03-20 LAB — LIPASE, BLOOD: Lipase: 58 U/L — ABNORMAL HIGH (ref 11–51)

## 2022-03-20 LAB — PHOSPHORUS: Phosphorus: 4.2 mg/dL (ref 2.5–4.6)

## 2022-03-20 LAB — BETA-HYDROXYBUTYRIC ACID
Beta-Hydroxybutyric Acid: 7.71 mmol/L — ABNORMAL HIGH (ref 0.05–0.27)
Beta-Hydroxybutyric Acid: 2.25 mmol/L — ABNORMAL HIGH (ref 0.05–0.27)

## 2022-03-20 LAB — GLUCOSE, CAPILLARY
Glucose-Capillary: 184 mg/dL — ABNORMAL HIGH (ref 70–99)
Glucose-Capillary: 170 mg/dL — ABNORMAL HIGH (ref 70–99)

## 2022-03-20 LAB — TROPONIN I (HIGH SENSITIVITY): Troponin I (High Sensitivity): 4 ng/L (ref <18)

## 2022-03-20 MED ORDER — LACTATED RINGERS IV BOLUS
2000.0000 mL | Freq: Once | INTRAVENOUS | Status: AC
  Administered 2022-03-20: 2000 mL via INTRAVENOUS

## 2022-03-20 MED ORDER — HYDROCODONE-ACETAMINOPHEN 5-325 MG PO TABS
1.0000 | ORAL_TABLET | ORAL | Status: DC | PRN
  Administered 2022-03-21 – 2022-03-22 (×4): 1 via ORAL
  Filled 2022-03-20 (×4): qty 1

## 2022-03-20 MED ORDER — INSULIN REGULAR(HUMAN) IN NACL 100-0.9 UT/100ML-% IV SOLN: INTRAVENOUS | Status: DC

## 2022-03-20 MED ORDER — MORPHINE SULFATE (PF) 4 MG/ML IV SOLN
4.0000 mg | Freq: Once | INTRAVENOUS | Status: AC
  Administered 2022-03-20: 4 mg via INTRAVENOUS
  Filled 2022-03-20: qty 1

## 2022-03-20 MED ORDER — ONDANSETRON HCL 4 MG/2ML IJ SOLN
4.0000 mg | INTRAMUSCULAR | Status: DC | PRN
  Administered 2022-03-21 (×4): 4 mg via INTRAVENOUS
  Filled 2022-03-20 (×4): qty 2

## 2022-03-20 MED ORDER — ALBUTEROL SULFATE (2.5 MG/3ML) 0.083% IN NEBU: 3.0000 mL | INHALATION_SOLUTION | Freq: Four times a day (QID) | RESPIRATORY_TRACT | Status: DC | PRN

## 2022-03-20 MED ORDER — TRAZODONE HCL 50 MG PO TABS: 25.0000 mg | ORAL_TABLET | Freq: Every evening | ORAL | Status: DC | PRN

## 2022-03-20 MED ORDER — MAGNESIUM HYDROXIDE 400 MG/5ML PO SUSP: 30.0000 mL | Freq: Every day | ORAL | Status: DC | PRN

## 2022-03-20 MED ORDER — ACETAMINOPHEN 325 MG PO TABS: 650.0000 mg | ORAL_TABLET | ORAL | Status: DC | PRN

## 2022-03-20 MED ORDER — LISINOPRIL 5 MG PO TABS
5.0000 mg | ORAL_TABLET | Freq: Every day | ORAL | Status: DC
  Administered 2022-03-20 – 2022-03-22 (×3): 5 mg via ORAL
  Filled 2022-03-20 (×4): qty 1

## 2022-03-20 MED ORDER — LEVOTHYROXINE SODIUM 50 MCG PO TABS
175.0000 ug | ORAL_TABLET | Freq: Every day | ORAL | Status: DC
  Administered 2022-03-21 – 2022-03-22 (×2): 175 ug via ORAL
  Filled 2022-03-20 (×2): qty 2

## 2022-03-20 MED ORDER — DEXTROSE 50 % IV SOLN: 0.0000 mL | INTRAVENOUS | Status: DC | PRN

## 2022-03-20 MED ORDER — POTASSIUM CHLORIDE 10 MEQ/100ML IV SOLN
10.0000 meq | INTRAVENOUS | Status: AC
  Administered 2022-03-20: 10 meq via INTRAVENOUS
  Filled 2022-03-20: qty 100

## 2022-03-20 MED ORDER — GABAPENTIN 400 MG PO CAPS
400.0000 mg | ORAL_CAPSULE | Freq: Three times a day (TID) | ORAL | Status: DC
  Administered 2022-03-20 – 2022-03-22 (×5): 400 mg via ORAL
  Filled 2022-03-20 (×5): qty 1

## 2022-03-20 MED ORDER — ONDANSETRON HCL 4 MG/2ML IJ SOLN
INTRAMUSCULAR | Status: AC
  Filled 2022-03-20: qty 2

## 2022-03-20 MED ORDER — DEXTROSE IN LACTATED RINGERS 5 % IV SOLN: INTRAVENOUS | Status: DC

## 2022-03-20 MED ORDER — FLUOXETINE HCL 20 MG PO CAPS
40.0000 mg | ORAL_CAPSULE | Freq: Every day | ORAL | Status: DC
  Administered 2022-03-20 – 2022-03-22 (×3): 40 mg via ORAL
  Filled 2022-03-20 (×3): qty 2

## 2022-03-20 MED ORDER — ENOXAPARIN SODIUM 40 MG/0.4ML IJ SOSY
40.0000 mg | PREFILLED_SYRINGE | INTRAMUSCULAR | Status: DC
Start: 2022-03-20 — End: 2022-03-22
  Administered 2022-03-20 – 2022-03-21 (×2): 40 mg via SUBCUTANEOUS
  Filled 2022-03-20 (×2): qty 0.4

## 2022-03-20 MED ORDER — LACTATED RINGERS IV SOLN: INTRAVENOUS | Status: DC

## 2022-03-20 MED ORDER — ONDANSETRON HCL 4 MG/2ML IJ SOLN
4.0000 mg | Freq: Once | INTRAMUSCULAR | Status: AC
  Administered 2022-03-20: 4 mg via INTRAVENOUS

## 2022-03-20 MED ORDER — INSULIN REGULAR(HUMAN) IN NACL 100-0.9 UT/100ML-% IV SOLN
INTRAVENOUS | Status: DC
Start: 2022-03-20 — End: 2022-03-21
  Administered 2022-03-20: 9 [IU]/h via INTRAVENOUS
  Filled 2022-03-20: qty 100

## 2022-03-20 MED ORDER — LACTATED RINGERS IV BOLUS
20.0000 mL/kg | Freq: Once | INTRAVENOUS | Status: AC
  Administered 2022-03-20: 1496 mL via INTRAVENOUS

## 2022-03-20 MED ORDER — METOCLOPRAMIDE HCL 5 MG/ML IJ SOLN
10.0000 mg | Freq: Once | INTRAMUSCULAR | Status: AC
  Administered 2022-03-20: 10 mg via INTRAVENOUS
  Filled 2022-03-20: qty 2

## 2022-03-20 NOTE — ED Triage Notes (Signed)
Patient to ER via [facility], reports hyperglycemia over the last several days. Patient reports being a T1 diabetic and has been unable to afford his insulin. States his CBG has been >600 at home. States he has been dizzy and nauseated.  Cbg in triage reads HI.

## 2022-03-20 NOTE — ED Provider Notes (Signed)
[facility] Provider Note    Event Date/Time   First MD Initiated Contact with Patient 03/20/22 1601     (approximate)   History   Hyperglycemia   HPI  David Ramsey is a 26 y.o. male with past medical history of type 1 diabetes hypothyroidism since with nausea vomiting abdominal pain diffuse body aches.  Symptoms started about 4 days ago.  He endorses pain rather diffusely in the extremities back abdomen and chest.  His abdominal pain is located in the left upper quadrant feels similar to episodes of pancreatitis he has had.  Today started vomiting has not been able to tolerate p.o.  Denies fevers chills.  Is having some mild dyspnea.  Feels similar to prior episodes of DKA.  Patient symptoms started prior to running out of his insulin which was 2 days ago.  He tried to get the prescriptions filled by his primary doctor but he when he went to the pharmacy the price that increase of his not able to purchase them.  He does endorse intermittent dysuria has been told he had UTIs in the past.  Back pain is diffuse thoracic and lumbar bilaterally.    Past Medical History:  Diagnosis Date   AKI (acute kidney injury) (HCC) 12/16/2016   Diabetes mellitus without complication (HCC)    INSULIN DEPENDENT   SINCE AGE 47   DKA (diabetic ketoacidoses) 02/17/2017   Hashimoto's disease    MRSA infection    Pneumonia    Liver contusion, lung effusion, sepsis multiorgan failure, no clear source    Patient Active Problem List   Diagnosis Date Noted   DKA, type 1 (HCC) 03/20/2022   Anxiety and depression 03/16/2022   UTI (urinary tract infection) 01/20/2022   Lactic acidosis 11/30/2021   Abnormal LFTs 11/30/2021   Abdominal pain, generalized 11/30/2021   Diabetic polyneuropathy associated with type 1 diabetes mellitus (HCC) 11/30/2021   Hyperkalemia 11/30/2021   Elevated d-dimer 11/07/2021   Hypokalemia 11/06/2021   Diabetic neuropathy (HCC) 11/06/2021   Acute  pancreatitis 11/04/2021   Diarrhea 10/25/2021   Leukocytosis 10/28/2020   Rash 10/28/2020   Uncontrolled type 1 diabetes mellitus with hyperglycemia, with long-term current use of insulin (HCC) 02/17/2017   Nausea & vomiting 02/17/2017   DKA (diabetic ketoacidosis) (HCC) 12/16/2016   Cellulitis 12/16/2016   DKA, type 1, not at goal [facility]) 12/16/2016   Hypothyroidism 11/01/2014   Type 1 diabetes mellitus with hyperglycemia (HCC) 08/15/2001     Physical Exam  Triage Vital Signs: ED Triage Vitals  Enc Vitals Group     BP 03/20/22 1548 130/87     Pulse Rate 03/20/22 1548 (!) 114     Resp 03/20/22 1548 20     Temp 03/20/22 1548 98.2 F (36.8 C)     Temp Source 03/20/22 1548 Oral     SpO2 03/20/22 1548 99 %     Weight 03/20/22 1547 165 lb (74.8 kg)     Height 03/20/22 1547 5\' 6"  (1.676 m)     Head Circumference --      Peak Flow --      Pain Score 03/20/22 1547 9     Pain Loc --      Pain Edu? --      Excl. in GC? --     Most recent vital signs: Vitals:   03/20/22 2218 03/20/22 2300  BP: (!) 140/92 136/89  Pulse: (!) 113 (!) 103  Resp: 20 14  Temp:  98 F (36.7 C)   SpO2: 100% 98%     General: Awake, no distress.  CV:  Good peripheral perfusion.  Resp:  Normal effort.  No increased work of breathing Abd:  No distention.  Tender to palpation epigastric region and left upper quadrant with voluntary guarding Neuro:             Awake, Alert, Oriented x 3  Other:  Mild CVA tenderness bilaterally was diffusely in the lumbar paraspinal region Dry mucous membranes   ED Results / Procedures / Treatments  Labs (all labs ordered are listed, but only abnormal results are displayed) Labs Reviewed  BASIC METABOLIC PANEL - Abnormal; Notable for the following components:      Result Value   Sodium 134 (*)    Chloride 92 (*)    CO2 18 (*)    Glucose, Bld 662 (*)    BUN 21 (*)    Creatinine, Ser 1.62 (*)    Anion gap 24 (*)    All other components within normal limits  CBC  - Abnormal; Notable for the following components:   Platelets 475 (*)    All other components within normal limits  URINALYSIS, ROUTINE W REFLEX MICROSCOPIC - Abnormal; Notable for the following components:   Color, Urine STRAW (*)    APPearance CLEAR (*)    Glucose, UA >=500 (*)    Ketones, ur 80 (*)    All other components within normal limits  BLOOD GAS, VENOUS - Abnormal; Notable for the following components:   pH, Ven 7.24 (*)    pCO2, Ven 36 (*)    pO2, Ven 56 (*)    Bicarbonate 15.4 (*)    Acid-base deficit 11.1 (*)    All other components within normal limits  BETA-HYDROXYBUTYRIC ACID - Abnormal; Notable for the following components:   Beta-Hydroxybutyric Acid 7.71 (*)    All other components within normal limits  MAGNESIUM - Abnormal; Notable for the following components:   Magnesium 2.5 (*)    All other components within normal limits  LIPASE, BLOOD - Abnormal; Notable for the following components:   Lipase 58 (*)    All other components within normal limits  HEPATIC FUNCTION PANEL - Abnormal; Notable for the following components:   Total Protein 8.2 (*)    Total Bilirubin 2.7 (*)    Indirect Bilirubin 2.5 (*)    All other components within normal limits  BASIC METABOLIC PANEL - Abnormal; Notable for the following components:   Sodium 134 (*)    CO2 16 (*)    Glucose, Bld 473 (*)    Creatinine, Ser 1.34 (*)    Anion gap 19 (*)    All other components within normal limits  BASIC METABOLIC PANEL - Abnormal; Notable for the following components:   Glucose, Bld 193 (*)    Creatinine, Ser 1.25 (*)    All other components within normal limits  BETA-HYDROXYBUTYRIC ACID - Abnormal; Notable for the following components:   Beta-Hydroxybutyric Acid 2.25 (*)    All other components within normal limits  GLUCOSE, CAPILLARY - Abnormal; Notable for the following components:   Glucose-Capillary 184 (*)    All other components within normal limits  GLUCOSE, CAPILLARY -  Abnormal; Notable for the following components:   Glucose-Capillary 170 (*)    All other components within normal limits  CBG MONITORING, ED - Abnormal; Notable for the following components:   Glucose-Capillary >600 (*)    All other components within normal  limits  CBG MONITORING, ED - Abnormal; Notable for the following components:   Glucose-Capillary 587 (*)    All other components within normal limits  CBG MONITORING, ED - Abnormal; Notable for the following components:   Glucose-Capillary 517 (*)    All other components within normal limits  CBG MONITORING, ED - Abnormal; Notable for the following components:   Glucose-Capillary 443 (*)    All other components within normal limits  CBG MONITORING, ED - Abnormal; Notable for the following components:   Glucose-Capillary 349 (*)    All other components within normal limits  CBG MONITORING, ED - Abnormal; Notable for the following components:   Glucose-Capillary 259 (*)    All other components within normal limits  PHOSPHORUS  BASIC METABOLIC PANEL  BASIC METABOLIC PANEL  BASIC METABOLIC PANEL  BETA-HYDROXYBUTYRIC ACID  BASIC METABOLIC PANEL  BETA-HYDROXYBUTYRIC ACID  TROPONIN I (HIGH SENSITIVITY)     EKG  EKG interpreted by myself shows sinus tachycardia with a right axis deviation 2 inversions in inferior and lateral precordial leads   RADIOLOGY I reviewed and interpreted the CXR which does not show any acute cardiopulmonary process    PROCEDURES:  Critical Care performed: Yes, see critical care procedure note(s)  .1-3 Lead EKG Interpretation  Performed by: Georga Hacking, MD Authorized by: Georga Hacking, MD     Interpretation: abnormal     ECG rate assessment: tachycardic     Rhythm: sinus tachycardia     Ectopy: none     Conduction: normal     The patient is on the cardiac monitor to evaluate for evidence of arrhythmia and/or significant heart rate changes.   MEDICATIONS ORDERED IN  ED: Medications  insulin regular, human (MYXREDLIN) 100 units/ 100 mL infusion (3.2 Units/hr Intravenous Rate/Dose Change 03/20/22 2113)  lactated ringers infusion ( Intravenous New Bag/Given 03/20/22 1803)  dextrose 5 % in lactated ringers infusion ( Intravenous New Bag/Given 03/20/22 2228)  HYDROcodone-acetaminophen (NORCO/VICODIN) 5-325 MG per tablet 1 tablet (has no administration in time range)  lisinopril (ZESTRIL) tablet 5 mg (5 mg Oral Given 03/20/22 2306)  FLUoxetine (PROZAC) capsule 40 mg (40 mg Oral Given 03/20/22 2306)  levothyroxine (SYNTHROID) tablet 175 mcg (has no administration in time range)  gabapentin (NEURONTIN) capsule 400 mg (400 mg Oral Given 03/20/22 2305)  albuterol (PROVENTIL) (2.5 MG/3ML) 0.083% nebulizer solution 3 mL (has no administration in time range)  enoxaparin (LOVENOX) injection 40 mg (40 mg Subcutaneous Given 03/20/22 2305)  dextrose 50 % solution 0-50 mL (has no administration in time range)  potassium chloride 10 mEq in 100 mL IVPB (10 mEq Intravenous New Bag/Given 03/20/22 2313)  acetaminophen (TYLENOL) tablet 650 mg (has no administration in time range)  ondansetron (ZOFRAN) injection 4 mg (has no administration in time range)  magnesium hydroxide (MILK OF MAGNESIA) suspension 30 mL (has no administration in time range)  traZODone (DESYREL) tablet 25 mg (has no administration in time range)  lactated ringers bolus 2,000 mL (0 mLs Intravenous Stopped 03/20/22 1751)  ondansetron (ZOFRAN) injection 4 mg ( Intravenous Canceled Entry 03/20/22 2309)  morphine (PF) 4 MG/ML injection 4 mg (4 mg Intravenous Given 03/20/22 1742)  metoCLOPramide (REGLAN) injection 10 mg (10 mg Intravenous Given 03/20/22 1802)  morphine (PF) 4 MG/ML injection 4 mg (4 mg Intravenous Given 03/20/22 2011)  lactated ringers bolus 1,496 mL (1,496 mLs Intravenous New Bag/Given 03/20/22 2154)     IMPRESSION / MDM / ASSESSMENT AND PLAN / ED COURSE  I reviewed the  triage vital signs and the nursing  notes.                              Patient's presentation is most consistent with acute presentation with potential threat to life or bodily function.  Differential diagnosis includes, but is not limited to, DKA, HHS, pancreatitis, sepsis, UTI  The patient is a 26 year old male with known type 1 diabetes presents today with nausea vomiting diffuse body aches abdominal pain and being out of his insulin for 2 days.  He is tachycardic mildly tachypneic blood sugar is reading high suspect DKA.  Labs confirm he has an anion gap of 24 bicarb of 18 but was 34 days ago, pH is 724.  Creatinine is mildly bumped to 1.6 from 1.3 BUN elevated at 21.  Sodium is 134.  Patient clinically looks dehydrated he is ordered for 2 L of LR was given Zofran.  Suspect that his DKA is in the setting of insulin noncompliance.  He says that his symptoms started about 4 days ago, I see labs from his primary care doctor which did show hyperglycemia but no anion gap.  He was unable to fill the prescription for his insulin because it was too expensive as insurance changed.  Patient does have history of pancreatitis so we will send lipase is abdominal pain could be in the setting of acidosis versus recurrent pancreatitis.  Losses and hepatic function panel mag phos.  Potassium is 5.1 so he is able to start his insulin drip.  We will also check UA given his intermittent dysuria and back pain.  Patient will need admission.  Patient's lipase is normal.  EKG shows T wave inversions in the inferior leads which are not new.  It was complaining of some chest pain so we will check troponin.  Think more likely his chest pain is in setting of his diffuse body pain he is feeling from the DKA.  I discussed with the hospitalist who agrees for admission     FINAL CLINICAL IMPRESSION(S) / ED DIAGNOSES   Final diagnoses:  Diabetic ketoacidosis without coma associated with type 1 diabetes mellitus (HCC)     Rx / DC Orders   ED Discharge  Orders     None        Note:  This document was prepared using Dragon voice recognition software and may include unintentional dictation errors.   Georga Hacking, MD 03/20/22 [phone]

## 2022-03-20 NOTE — ED Notes (Signed)
Pt given urinal and instructed on use for sample 

## 2022-03-20 NOTE — H&P (Signed)
[place]   PATIENT NAME: David Ramsey    MR#:  [id number]  DATE OF BIRTH:  June 18, 1996  DATE OF ADMISSION:  03/20/2022  PRIMARY CARE PHYSICIAN: Terrilyn Saver, NP   Patient is coming from: Home  REQUESTING/REFERRING PHYSICIAN: Rada Hay, MD  CHIEF COMPLAINT:   Chief Complaint  Patient presents with   Hyperglycemia    HISTORY OF PRESENT ILLNESS:  David Ramsey is a 26 y.o. male with medical history significant for type 1 diabetes mellitus and Hashimoto's disease as well as chronic pancreatitis, who presented to the emergency room with acute onset of intractable nausea and vomiting for the last 4 days with associated epigastric and left upper quadrant abdominal pain as well as diffuse body aches.  He denied any bilious vomitus or hematemesis.  No melena or bright red bleeding per rectum.  No diarrhea or constipation.  He has been having mild dysuria, polyuria and polydipsia.  He admitted to right-sided chest as well as left-sided chest pain increasing with deep breathing.  No cough or wheezing or hemoptysis.  No other bleeding diathesis.  ED Course: When he came to the emergency room upon, heart rate was 115 with otherwise normal vital signs.  Labs revealed a blood glucose of 662 and creatinine 1.62 with anion gap of 24 and CO2 of 18 and later 16.  Beta hydroxy butyrate was 7.71 and later 2.25.  UA showed more than 500 glucose and 80 ketones.  CBC showed thrombocytosis of 435.  VBG showed pH 7.24 and bicarbonate 15.4.  Lipase was 58.  EKG as reviewed by me : EKG showed sinus tachycardia with a rate of 106 and borderline right axis deviation and Q waves anteroseptally as well as T wave inversion inferiorly. Imaging: Portable chest x-ray showed no acute cardiopulmonary disease.  The patient was given a bolus of 2 L of IV lactated Ringer at 125 mill per hour and was started on IV insulin drip per Endo tool as well as 10 mg of IV Reglan, 4 mg of IV morphine sulfate twice  and 4 mg of IV Zofran.  He will be admitted to a stepdown unit bed for further evaluation and management. PAST MEDICAL HISTORY:   Past Medical History:  Diagnosis Date   AKI (acute kidney injury) ([place]) 12/16/2016   Diabetes mellitus without complication (HCC)    INSULIN DEPENDENT   SINCE AGE 48   DKA (diabetic ketoacidoses) 02/17/2017   Hashimoto's disease    MRSA infection    Pneumonia    Liver contusion, lung effusion, sepsis multiorgan failure, no clear source    PAST SURGICAL HISTORY:   Past Surgical History:  Procedure Laterality Date   CHEST TUBE INSERTION      SOCIAL HISTORY:   Social History   Tobacco Use   Smoking status: Former    Packs/day: 0.50    Years: 4.00    Total pack years: 2.00    Types: Cigarettes    Quit date: 05/23/2017    Years since quitting: 4.8   Smokeless tobacco: Never  Substance Use Topics   Alcohol use: Yes    Comment: occ    FAMILY HISTORY:   Family History  Problem Relation Age of Onset   Diabetes Cousin    Heart disease Other    Cancer Other    Thyroid disease Other    Healthy Mother     DRUG ALLERGIES:   Allergies  Allergen Reactions   Tape Rash  Blisters- can use kerlix tape    REVIEW OF SYSTEMS:   ROS As per history of present illness. All pertinent systems were reviewed above. Constitutional, HEENT, cardiovascular, respiratory, GI, GU, musculoskeletal, neuro, psychiatric, endocrine, integumentary and hematologic systems were reviewed and are otherwise negative/unremarkable except for positive findings mentioned above in the HPI.   MEDICATIONS AT HOME:   Prior to Admission medications   Medication Sig Start Date End Date Taking? Authorizing Provider  gabapentin (NEURONTIN) 400 MG capsule Take 1 capsule (400 mg total) by mouth 3 (three) times daily. 03/16/22 04/15/22 Yes Terrilyn Saver, NP  ondansetron (ZOFRAN-ODT) 4 MG disintegrating tablet Take 1 tablet (4 mg total) by mouth every 8 (eight) hours as needed for  nausea or vomiting. 02/23/22  Yes Harvest Dark, MD  albuterol (VENTOLIN HFA) 108 (90 Base) MCG/ACT inhaler Inhale 2 puffs into the lungs every 6 (six) hours as needed for wheezing or shortness of breath. 11/08/21   Loletha Grayer, MD  blood glucose meter kit and supplies KIT Dispense based on patient and insurance preference. Use up to four times daily as directed. 11/03/20   Raiford Noble Latif, DO  Continuous Blood Gluc Receiver ([place]) [place] 1 each by Does not apply route daily. 03/16/22   Terrilyn Saver, NP  Continuous Blood Gluc Sensor (DEXCOM G6 SENSOR) MISC 1 each by Does not apply route daily. 03/16/22   Terrilyn Saver, NP  FLUoxetine (PROZAC) 40 MG capsule Take 1 capsule (40 mg total) by mouth daily. 03/16/22   Caleen Jobs B, NP  glucose blood test strip Use as instructed 01/22/22   Wyvonnia Dusky, MD  HYDROcodone-acetaminophen (NORCO/VICODIN) 5-325 MG tablet Take 1 tablet by mouth every 4 (four) hours as needed for moderate pain. 02/23/22 02/23/23  Harvest Dark, MD  insulin detemir (LEVEMIR FLEXTOUCH) 100 UNIT/ML FlexPen Inject 15 Units into the skin 2 (two) times daily. 03/16/22   Terrilyn Saver, NP  insulin lispro (HUMALOG KWIKPEN) 100 UNIT/ML KwikPen Inject 7 Units into the skin with breakfast, with lunch, and with evening meal. 03/16/22   Terrilyn Saver, NP  levothyroxine (SYNTHROID) 175 MCG tablet Take 1 tablet (175 mcg total) by mouth daily. 03/17/22   Terrilyn Saver, NP  lisinopril (ZESTRIL) 5 MG tablet Take 1 tablet (5 mg total) by mouth daily. 03/17/22   Terrilyn Saver, NP  pantoprazole (PROTONIX) 40 MG tablet Take 1 tablet (40 mg total) by mouth daily. 02/18/17 06/06/20  Ghimire, Henreitta Leber, MD      VITAL SIGNS:  Blood pressure (!) 142/103, pulse (!) 119, temperature 98 F (36.7 C), temperature source Oral, resp. rate (!) 29, height 5' 6" (1.676 m), weight 70.8 kg, SpO2 100 %.  PHYSICAL EXAMINATION:  Physical Exam  GENERAL:  26 y.o.-year-old patient lying in  the bed with no acute distress.  EYES: Pupils equal, round, reactive to light and accommodation. No scleral icterus. Extraocular muscles intact.  HEENT: Head atraumatic, normocephalic. Oropharynx and nasopharynx clear.  NECK:  Supple, no jugular venous distention. No thyroid enlargement, no tenderness.  LUNGS: Normal breath sounds bilaterally, no wheezing, rales,rhonchi or crepitation. No use of accessory muscles of respiration.  CARDIOVASCULAR: Regular rate and rhythm, S1, S2 normal. No murmurs, rubs, or gallops.  ABDOMEN: Soft, nondistended, with mild epigastric and left upper quadrant tenderness without rebound tenderness guarding or rigidity. Bowel sounds present. No organomegaly or mass.  EXTREMITIES: No pedal edema, cyanosis, or clubbing.  NEUROLOGIC: Cranial nerves II through XII are intact. Muscle  strength 5/5 in all extremities. Sensation intact. Gait not checked.  PSYCHIATRIC: The patient is alert and oriented x 3.  Normal affect and good eye contact. SKIN: No obvious rash, lesion, or ulcer.   LABORATORY PANEL:   CBC Recent Labs  Lab 03/20/22 1555  WBC 8.6  HGB 15.0  HCT 45.5  PLT 475*   ------------------------------------------------------------------------------------------------------------------  Chemistries  Recent Labs  Lab 03/20/22 1555 03/20/22 1920 03/20/22 2225  NA 134*   < > 137  K 5.1   < > 4.4  CL 92*   < > 103  CO2 18*   < > 24  GLUCOSE 662*   < > 193*  BUN 21*   < > 17  CREATININE 1.62*   < > 1.25*  CALCIUM 9.6   < > 9.2  MG 2.5*  --   --   AST 24  --   --   ALT 31  --   --   ALKPHOS 85  --   --   BILITOT 2.7*  --   --    < > = values in this interval not displayed.   ------------------------------------------------------------------------------------------------------------------  Cardiac Enzymes No results for input(s): "TROPONINI" in the last 168  hours. ------------------------------------------------------------------------------------------------------------------  RADIOLOGY:  DG Chest Portable 1 View  Result Date: 03/20/2022 CLINICAL DATA:  Shortness of breath EXAM: PORTABLE CHEST 1 VIEW COMPARISON:  02/23/2022 FINDINGS: Heart and mediastinal contours are within normal limits. No focal opacities or effusions. No acute bony abnormality. IMPRESSION: No active disease. Electronically Signed   By: Rolm Baptise M.D.   On: 03/20/2022 17:19      IMPRESSION AND PLAN:  Assessment and Plan: * DKA, type 1 ([place]) - The patient will be admitted to a stepdown unit. - We will continue IV insulin drip per Endo tool. - We will continue hydration per DKA protocol. - We will follow serial BMPs.  Nausea & vomiting - As needed antiemetics will be provided. - He will be hydrated as mentioned above. - Given associated abdominal pain, will obtain abdominal pelvic CT scan with improvement of his creatinine.  AKI (acute kidney injury) ([place]) - We will hydrate with IV normal saline and follow BMP. - We will avoid nephrotoxins  Essential hypertension - We will hold off Zestril given acute kidney injury and place on as needed IV labetalol.  Depression - We will continue Prozac.  Hypothyroidism - We will continue Synthroid.   DVT prophylaxis: Lovenox.  Advanced Care Planning:  Code Status: full code.  Family Communication:  The plan of care was discussed in details with the patient (and family). I answered all questions. The patient agreed to proceed with the above mentioned plan. Further management will depend upon hospital course. Disposition Plan: Back to previous home environment Consults called: none.  All the records are reviewed and case discussed with ED provider.  Status is: Inpatient  At the time of the admission, it appears that the appropriate admission status for this patient is inpatient.  This is judged to be reasonable and  necessary in order to provide the required intensity of service to ensure the patient's safety given the presenting symptoms, physical exam findings and initial radiographic and laboratory data in the context of comorbid conditions.  The patient requires inpatient status due to high intensity of service, high risk of further deterioration and high frequency of surveillance required.  I certify that at the time of admission, it is my clinical judgment that the patient will require inpatient  hospital care extending more than 2 midnights.                            Dispo: The patient is from: Home              Anticipated d/c is to: Home              Patient currently is not medically stable to d/c.              Difficult to place patient: No  Authorized and performed by: Eugenie Norrie, MD Total critical care time: Approximately   50    minutes. Due to a high probability of clinically significant, life-threatening deterioration, the patient required my highest level of preparedness to intervene emergently and I personally spent this critical care time directly and personally managing the patient.  This critical care time included obtaining a history, examining the patient, pulse oximetry, ordering and review of studies, arranging urgent treatment with development of management plan, evaluation of patient's response to treatment, frequent reassessment, and discussions with other providers. This critical care time was performed to assess and manage the high probability of imminent, life-threatening deterioration that could result in multiorgan failure.  It was exclusive of separately billable procedures and treating other patients and teaching time.   Christel Mormon M.D on 03/21/2022 at 1:26 AM  [facility]   From 7 PM-7 AM, contact night-coverage [email or web address]  CC: Primary care physician; Terrilyn Saver, NP

## 2022-03-20 NOTE — ED Notes (Signed)
EDP notified of glucose of 662 per lab at this time

## 2022-03-21 ENCOUNTER — Inpatient Hospital Stay: Payer: BC Managed Care – PPO

## 2022-03-21 DIAGNOSIS — R109 Unspecified abdominal pain: Secondary | ICD-10-CM | POA: Diagnosis not present

## 2022-03-21 DIAGNOSIS — E1065 Type 1 diabetes mellitus with hyperglycemia: Secondary | ICD-10-CM

## 2022-03-21 DIAGNOSIS — E038 Other specified hypothyroidism: Secondary | ICD-10-CM | POA: Diagnosis not present

## 2022-03-21 DIAGNOSIS — K861 Other chronic pancreatitis: Secondary | ICD-10-CM | POA: Diagnosis present

## 2022-03-21 DIAGNOSIS — E1010 Type 1 diabetes mellitus with ketoacidosis without coma: Secondary | ICD-10-CM | POA: Diagnosis not present

## 2022-03-21 DIAGNOSIS — F32A Depression, unspecified: Secondary | ICD-10-CM | POA: Diagnosis not present

## 2022-03-21 DIAGNOSIS — E663 Overweight: Secondary | ICD-10-CM | POA: Diagnosis not present

## 2022-03-21 DIAGNOSIS — I10 Essential (primary) hypertension: Secondary | ICD-10-CM | POA: Diagnosis present

## 2022-03-21 LAB — GLUCOSE, CAPILLARY
Glucose-Capillary: 139 mg/dL — ABNORMAL HIGH (ref 70–99)
Glucose-Capillary: 152 mg/dL — ABNORMAL HIGH (ref 70–99)
Glucose-Capillary: 169 mg/dL — ABNORMAL HIGH (ref 70–99)
Glucose-Capillary: 171 mg/dL — ABNORMAL HIGH (ref 70–99)
Glucose-Capillary: 172 mg/dL — ABNORMAL HIGH (ref 70–99)
Glucose-Capillary: 176 mg/dL — ABNORMAL HIGH (ref 70–99)
Glucose-Capillary: 289 mg/dL — ABNORMAL HIGH (ref 70–99)
Glucose-Capillary: 367 mg/dL — ABNORMAL HIGH (ref 70–99)

## 2022-03-21 LAB — BASIC METABOLIC PANEL
Anion gap: 11 (ref 5–15)
Anion gap: 7 (ref 5–15)
Anion gap: 8 (ref 5–15)
Anion gap: 11 (ref 5–15)
BUN: 14 mg/dL (ref 6–20)
BUN: 13 mg/dL (ref 6–20)
BUN: 10 mg/dL (ref 6–20)
BUN: 9 mg/dL (ref 6–20)
CO2: 24 mmol/L (ref 22–32)
CO2: 26 mmol/L (ref 22–32)
CO2: 25 mmol/L (ref 22–32)
CO2: 23 mmol/L (ref 22–32)
Calcium: 8.6 mg/dL — ABNORMAL LOW (ref 8.9–10.3)
Calcium: 8.7 mg/dL — ABNORMAL LOW (ref 8.9–10.3)
Calcium: 8.5 mg/dL — ABNORMAL LOW (ref 8.9–10.3)
Calcium: 8.6 mg/dL — ABNORMAL LOW (ref 8.9–10.3)
Chloride: 104 mmol/L (ref 98–111)
Chloride: 102 mmol/L (ref 98–111)
Chloride: 102 mmol/L (ref 98–111)
Chloride: 100 mmol/L (ref 98–111)
Creatinine, Ser: 1.14 mg/dL (ref 0.61–1.24)
Creatinine, Ser: 1.14 mg/dL (ref 0.61–1.24)
Creatinine, Ser: 1.1 mg/dL (ref 0.61–1.24)
Creatinine, Ser: 1.25 mg/dL — ABNORMAL HIGH (ref 0.61–1.24)
GFR, Estimated: >60 mL/min (ref >60)
GFR, Estimated: >60 mL/min (ref >60)
GFR, Estimated: >60 mL/min (ref >60)
GFR, Estimated: >60 mL/min (ref >60)
Glucose, Bld: 147 mg/dL — ABNORMAL HIGH (ref 70–99)
Glucose, Bld: 164 mg/dL — ABNORMAL HIGH (ref 70–99)
Glucose, Bld: 148 mg/dL — ABNORMAL HIGH (ref 70–99)
Glucose, Bld: 270 mg/dL — ABNORMAL HIGH (ref 70–99)
Potassium: 4 mmol/L (ref 3.5–5.1)
Potassium: 3.8 mmol/L (ref 3.5–5.1)
Potassium: 3.4 mmol/L — ABNORMAL LOW (ref 3.5–5.1)
Potassium: 3.6 mmol/L (ref 3.5–5.1)
Sodium: 139 mmol/L (ref 135–145)
Sodium: 135 mmol/L (ref 135–145)
Sodium: 135 mmol/L (ref 135–145)
Sodium: 134 mmol/L — ABNORMAL LOW (ref 135–145)

## 2022-03-21 LAB — BETA-HYDROXYBUTYRIC ACID
Beta-Hydroxybutyric Acid: 2.55 mmol/L — ABNORMAL HIGH (ref 0.05–0.27)
Beta-Hydroxybutyric Acid: 4.43 mmol/L — ABNORMAL HIGH (ref 0.05–0.27)

## 2022-03-21 MED ORDER — INSULIN ASPART 100 UNIT/ML IJ SOLN
0.0000 [IU] | INTRAMUSCULAR | Status: DC
  Administered 2022-03-21 (×2): 4 [IU] via SUBCUTANEOUS
  Filled 2022-03-21 (×2): qty 1

## 2022-03-21 MED ORDER — INSULIN ASPART 100 UNIT/ML IJ SOLN: 0.0000 [IU] | Freq: Every day | INTRAMUSCULAR | Status: DC

## 2022-03-21 MED ORDER — POTASSIUM CHLORIDE 10 MEQ/100ML IV SOLN
10.0000 meq | Freq: Once | INTRAVENOUS | Status: AC
Start: 2022-03-21 — End: 2022-03-21
  Administered 2022-03-21: 10 meq via INTRAVENOUS
  Filled 2022-03-21: qty 100

## 2022-03-21 MED ORDER — IOHEXOL 300 MG/ML  SOLN
100.0000 mL | Freq: Once | INTRAMUSCULAR | Status: AC | PRN
  Administered 2022-03-21: 100 mL via INTRAVENOUS

## 2022-03-21 MED ORDER — CHLORHEXIDINE GLUCONATE CLOTH 2 % EX PADS
6.0000 | MEDICATED_PAD | Freq: Every day | CUTANEOUS | Status: DC
  Administered 2022-03-22: 6 via TOPICAL

## 2022-03-21 MED ORDER — INSULIN DETEMIR 100 UNIT/ML ~~LOC~~ SOLN
15.0000 [IU] | Freq: Two times a day (BID) | SUBCUTANEOUS | Status: DC
Start: 2022-03-21 — End: 2022-03-22
  Administered 2022-03-21 – 2022-03-22 (×3): 15 [IU] via SUBCUTANEOUS
  Filled 2022-03-21 (×4): qty 0.15

## 2022-03-21 MED ORDER — INSULIN GLARGINE-YFGN 100 UNIT/ML ~~LOC~~ SOLN
5.0000 [IU] | Freq: Once | SUBCUTANEOUS | Status: AC
  Administered 2022-03-21: 5 [IU] via SUBCUTANEOUS
  Filled 2022-03-21: qty 0.05

## 2022-03-21 MED ORDER — SODIUM CHLORIDE 0.9 % IV SOLN: INTRAVENOUS | Status: DC

## 2022-03-21 MED ORDER — INSULIN ASPART 100 UNIT/ML IJ SOLN
0.0000 [IU] | Freq: Three times a day (TID) | INTRAMUSCULAR | Status: DC
  Administered 2022-03-21: 5 [IU] via SUBCUTANEOUS
  Administered 2022-03-21: 9 [IU] via SUBCUTANEOUS
  Administered 2022-03-22: 3 [IU] via SUBCUTANEOUS
  Administered 2022-03-22: 2 [IU] via SUBCUTANEOUS
  Filled 2022-03-21 (×4): qty 1

## 2022-03-21 MED ORDER — MORPHINE SULFATE (PF) 2 MG/ML IV SOLN
2.0000 mg | INTRAVENOUS | Status: DC | PRN
  Administered 2022-03-21 (×5): 2 mg via INTRAVENOUS
  Filled 2022-03-21 (×5): qty 1

## 2022-03-21 NOTE — Assessment & Plan Note (Signed)
-   We will hold off Zestril given acute kidney injury and place on as needed IV labetalol.

## 2022-03-21 NOTE — Progress Notes (Signed)
[facility] Progress Note  Patient: David Ramsey    KGM:[id number]  DOA: 03/20/2022    Date of Service: the patient was seen and examined on 03/21/2022  Brief hospital course: Patient is a 26 year old male with past medical history of Hashimoto's disease, chronic pancreatitis and type 1 diabetes mellitus who presented to the emergency room on 7/14 with complaints of nausea and vomiting and left upper quadrant abdominal pain x4 days.  Patient was found to be in DKA with an anion gap of 24 and CBG of 662.  Patient tells me that he has been taking his long-acting insulin although he has been experiencing morning hypoglycemia followed by spiking blood sugars later on the day.  He also believes that his NovoLog for several months as the price had changed dramatically.  Patient was placed on insulin drip plus IV fluids.  Overnight, anion gap resolved and patient was started on Lantus and sliding scale.  Assessment and Plan: Assessment and Plan: * DKA, type 1 (HCC)-resolved as of 03/21/2022 A1c pending.  Status post IV fluids and insulin drip and DKA now resolved.  Uncontrolled type 1 diabetes mellitus with hyperglycemia, with long-term current use of insulin (HCC) A1c pending.  We will have diabetes coordinator see patient.  He has never been to an endocrinologist, although just established with 1  AKI (acute kidney injury) (HCC) Resolved with aggressive fluid resuscitation.  Essential hypertension - We will hold off Zestril given acute kidney injury and place on as needed IV labetalol.  Depression - We will continue Prozac.  Hypothyroidism - We will continue Synthroid.  Free T4 last month within normal limits.  Chronic pancreatitis (HCC) Stable exam left upper quadrant abdominal pain.  Signs lipases never.  May be more secondary to TIA and hypoglycemic issues.  Following.  Overweight (BMI 25.0-29.9) Meets criteria for BMI greater than 25       Body mass index is 25.19 kg/m.         Consultants: None  Procedures: None  Antimicrobials: None   Code Status: Full code    Subjective: Hungry, still some right upper quadrant abd pain  Objective: Vital signs were reviewed and unremarkable. Vitals:   03/21/22 1100 03/21/22 1332  BP: 129/77 (!) 99/55  Pulse: (!) 101   Resp: 15   Temp:    SpO2: 99%     Intake/Output Summary (Last 24 hours) at 03/21/2022 1342 Last data filed at 03/21/2022 1026 Gross per 24 hour  Intake 3766.56 ml  Output 1500 ml  Net 2266.56 ml   Filed Weights   03/20/22 1547 03/20/22 2218  Weight: 74.8 kg 70.8 kg   Body mass index is 25.19 kg/m.  Exam:  General: Alert and oriented x3, no acute distress HEENT: Normocephalic and atraumatic, mucous membranes are slightly dry Cardiovascular: Regular rate and rhythm, S1-S2 Respiratory: Clear to auscultation bilaterally Abdomen: Soft, nontender, nondistended, positive bowel sounds Musculoskeletal: No clubbing or cyanosis or edema Skin: No skin breaks, tears or lesions Psychiatry: Appropriate, no evidence of psychoses Neurology: No focal deficits  Data Reviewed: Noted some hypomagnesemia and hypokalemia  Disposition:  Status is: Inpatient Remains inpatient appropriate because: Stabilization of blood sugars    Anticipated discharge date: 7/16  Family Communication: Left message for family DVT Prophylaxis: enoxaparin (LOVENOX) injection 40 mg Start: 03/20/22 2200    Author: Hollice Espy ,MD 03/21/2022 1:42 PM  To reach On-call, see care teams to locate the attending and reach out via [email or web address]. Between 7PM-7AM, please contact night-coverage If  you still have difficulty reaching the attending provider, please page the [facility] (Director on Call) for [facility] on amion for assistance.

## 2022-03-21 NOTE — Assessment & Plan Note (Signed)
-   As needed antiemetics will be provided. - He will be hydrated as mentioned above. - Given associated abdominal pain, will obtain abdominal pelvic CT scan with improvement of his creatinine.

## 2022-03-21 NOTE — Assessment & Plan Note (Signed)
-   We will continue Prozac. 

## 2022-03-21 NOTE — TOC Initial Note (Signed)
Transition of Care [facility]) - Initial/Assessment Note    Patient Details  Name: David Ramsey MRN: [id number] Date of Birth: 03/25/1996  Transition of Care [facility]) CM/SW Contact:    Liliana Cline, LCSW Phone Number: 03/21/2022, 11:19 AM  Clinical Narrative:                 CSW completed assessment with patient. Patient lives with his wife and drives himself to appointments. PCP is Hyman Hopes (NP). [facility] Patient states he has recently had trouble affording his insulin due to the copays increasing from $15 to $120. Patient states the pharmacy told him to call his insurance. Patient agrees to call his insurance today to see why his copays increase.  Patient agreed to reach out if additional needs arise after he speaks with his insurance company.   Expected Discharge Plan: Home/Self Care Barriers to Discharge: Continued Medical Work up   Patient Goals and CMS Choice Patient states their goals for this hospitalization and ongoing recovery are:: to return home CMS Medicare.gov Compare Post Acute Care list provided to:: Patient Choice offered to / list presented to : Patient  Expected Discharge Plan and Services Expected Discharge Plan: Home/Self Care       Living arrangements for the past 2 months: Single Family Home                                      Prior Living Arrangements/Services Living arrangements for the past 2 months: Single Family Home Lives with:: Spouse Patient language and need for interpreter reviewed:: Yes        Need for Family Participation in Patient Care: Yes (Comment) Care giver support system in place?: Yes (comment)   Criminal Activity/Legal Involvement Pertinent to Current Situation/Hospitalization: No - Comment as needed  Activities of Daily Living Home Assistive Devices/Equipment: None ADL Screening (condition at time of admission) Patient's cognitive ability adequate to safely complete daily activities?:  Yes Is the patient deaf or have difficulty hearing?: No Does the patient have difficulty seeing, even when wearing glasses/contacts?: No Does the patient have difficulty concentrating, remembering, or making decisions?: No Patient able to express need for assistance with ADLs?: Yes Does the patient have difficulty dressing or bathing?: No Independently performs ADLs?: Yes (appropriate for developmental age) Does the patient have difficulty walking or climbing stairs?: No Weakness of Legs: None Weakness of Arms/Hands: None  Permission Sought/Granted                  Emotional Assessment       Orientation: : Oriented to Self, Oriented to Place, Oriented to  Time, Oriented to Situation Alcohol / Substance Use: Not Applicable Psych Involvement: No (comment)  Admission diagnosis:  DKA, type 1 (HCC) [E10.10] Diabetic ketoacidosis without coma associated with type 1 diabetes mellitus (HCC) [E10.10] Patient Active Problem List   Diagnosis Date Noted   Essential hypertension 03/21/2022   Depression 03/21/2022   DKA, type 1 (HCC) 03/20/2022   Anxiety and depression 03/16/2022   UTI (urinary tract infection) 01/20/2022   Lactic acidosis 11/30/2021   Abnormal LFTs 11/30/2021   Abdominal pain, generalized 11/30/2021   Diabetic polyneuropathy associated with type 1 diabetes mellitus (HCC) 11/30/2021   Hyperkalemia 11/30/2021   Elevated d-dimer 11/07/2021   Hypokalemia 11/06/2021   Diabetic neuropathy (HCC) 11/06/2021   Acute pancreatitis 11/04/2021   Diarrhea 10/25/2021   Leukocytosis 10/28/2020  Rash 10/28/2020   Uncontrolled type 1 diabetes mellitus with hyperglycemia, with long-term current use of insulin (HCC) 02/17/2017   Nausea & vomiting 02/17/2017   DKA (diabetic ketoacidosis) (HCC) 12/16/2016   Cellulitis 12/16/2016   AKI (acute kidney injury) (HCC) 12/16/2016   DKA, type 1, not at goal [facility]) 12/16/2016   Hypothyroidism 11/01/2014   Type 1 diabetes mellitus with  hyperglycemia (HCC) 08/15/2001   PCP:  [name], NP [facility]   [phone] [place], [place] - [address] [place] [address] Phone: [phone] Fax: [phone]  [facility] [place] [place] - [address] [place] [address] Phone: [phone] Fax: [phone]  [id number] [place], [place] - [address] AT [facility] [address] [place] [address] Phone: [phone] Fax: [phone]     Social Determinants of Health (SDOH) Interventions    Readmission Risk Interventions    03/21/2022   11:18 AM  Readmission Risk Prevention Plan  Transportation Screening Complete  PCP or Specialist Appt within 3-5 Days Complete  HRI or Home Care Consult Complete  Social Work Consult for Recovery Care Planning/Counseling Complete  Palliative Care Screening Not Applicable  Medication Review [employer]) Complete

## 2022-03-21 NOTE — Assessment & Plan Note (Addendum)
-   We will continue Synthroid.  Patient has not been taking his Synthroid.  Last month, TSH down to 42 but free T4 normal.  Likely subclinical hypothyroidism as well as noncompliance

## 2022-03-21 NOTE — Hospital Course (Signed)
Patient is a 26 year old male with past medical history of Hashimoto's disease, chronic pancreatitis and type 1 diabetes mellitus who presented to the emergency room on 7/14 with complaints of nausea and vomiting and left upper quadrant abdominal pain x4 days.  Patient was found to be in DKA with an anion gap of 24 and CBG of 662.  Patient tells me that he has been taking his long-acting insulin although he has been experiencing morning hypoglycemia followed by spiking blood sugars later on the day.  He also believes that his NovoLog for several months as the price had changed dramatically.  Patient was placed on insulin drip plus IV fluids.  Overnight, anion gap resolved and patient was started on Lantus and sliding scale.

## 2022-03-21 NOTE — Assessment & Plan Note (Addendum)
Resolved with aggressive fluid resuscitation.

## 2022-03-21 NOTE — Assessment & Plan Note (Signed)
A1c pending.  We will have diabetes coordinator see patient.  He has never been to an endocrinologist, although just established with 1

## 2022-03-21 NOTE — Inpatient Diabetes Management (Addendum)
Inpatient Diabetes Program Recommendations  AACE/ADA: New Consensus Statement on Inpatient Glycemic Control (2015)  Target Ranges:  Prepandial:   less than 140 mg/dL      Peak postprandial:   less than 180 mg/dL (1-2 hours)      Critically ill patients:  140 - 180 mg/dL   Lab Results  Component Value Date   GLUCAP 367 (H) 03/21/2022   HGBA1C 12.1 (H) 10/26/2021    Review of Glycemic Control  Latest Reference Range & Units 03/21/22 03:11 03/21/22 07:20 03/21/22 11:34 03/21/22 16:37  Glucose-Capillary 70 - 99 mg/dL 093 (H) 112 (H) 162 (H) 367 (H)   Diabetes history: DM type 1 Outpatient Diabetes medications: Levemir 15 units bid, Humalog 7 units tid Current orders for Inpatient glycemic control:  Levemir 15 units bid Novolog 0-9 units tid + hs  A1c 12.1% on 10/26/21  Inpatient Diabetes Program Recommendations:    Glucose trends increase after meal intake. Pt needs carbohydrate coverage since he has Type 1 DM.   -  Consider Novolog 5 units tid meal coverage if eating >50% of meals  Note Diabetes Coordinators work remotely over the weekends. Will call pt while here.  Thanks,  Christena Deem RN, MSN, BC-ADM Inpatient Diabetes Coordinator Team Pager [phone] (8a-5p)

## 2022-03-21 NOTE — Assessment & Plan Note (Signed)
Meets criteria for BMI greater than 25 

## 2022-03-21 NOTE — Assessment & Plan Note (Addendum)
Status post IV fluids and insulin drip and DKA now resolved.

## 2022-03-21 NOTE — Assessment & Plan Note (Signed)
Stable exam left upper quadrant abdominal pain.  Signs lipases never.  May be more secondary to TIA and hypoglycemic issues.  Following.

## 2022-03-22 DIAGNOSIS — E1010 Type 1 diabetes mellitus with ketoacidosis without coma: Secondary | ICD-10-CM | POA: Diagnosis not present

## 2022-03-22 DIAGNOSIS — F32A Depression, unspecified: Secondary | ICD-10-CM | POA: Diagnosis not present

## 2022-03-22 DIAGNOSIS — K861 Other chronic pancreatitis: Secondary | ICD-10-CM | POA: Diagnosis not present

## 2022-03-22 DIAGNOSIS — E038 Other specified hypothyroidism: Secondary | ICD-10-CM | POA: Diagnosis not present

## 2022-03-22 DIAGNOSIS — E063 Autoimmune thyroiditis: Secondary | ICD-10-CM

## 2022-03-22 LAB — HEMOGLOBIN A1C
Hgb A1c MFr Bld: 10 % — ABNORMAL HIGH (ref 4.8–5.6)
Mean Plasma Glucose: 240.3 mg/dL

## 2022-03-22 LAB — GLUCOSE, CAPILLARY
Glucose-Capillary: 59 mg/dL — ABNORMAL LOW (ref 70–99)
Glucose-Capillary: 83 mg/dL (ref 70–99)
Glucose-Capillary: 204 mg/dL — ABNORMAL HIGH (ref 70–99)
Glucose-Capillary: 181 mg/dL — ABNORMAL HIGH (ref 70–99)

## 2022-03-22 LAB — BASIC METABOLIC PANEL
Anion gap: 3 — ABNORMAL LOW (ref 5–15)
BUN: 8 mg/dL (ref 6–20)
CO2: 28 mmol/L (ref 22–32)
Calcium: 8.3 mg/dL — ABNORMAL LOW (ref 8.9–10.3)
Chloride: 108 mmol/L (ref 98–111)
Creatinine, Ser: 1.11 mg/dL (ref 0.61–1.24)
GFR, Estimated: >60 mL/min (ref >60)
Glucose, Bld: 172 mg/dL — ABNORMAL HIGH (ref 70–99)
Potassium: 4 mmol/L (ref 3.5–5.1)
Sodium: 139 mmol/L (ref 135–145)

## 2022-03-22 MED ORDER — LEVOTHYROXINE SODIUM 175 MCG PO TABS: 175.0000 ug | ORAL_TABLET | Freq: Every day | ORAL | 2 refills | Status: DC

## 2022-03-22 MED ORDER — HUMALOG KWIKPEN 200 UNIT/ML ~~LOC~~ SOPN: PEN_INJECTOR | SUBCUTANEOUS | 2 refills | Status: DC

## 2022-03-22 MED ORDER — LEVEMIR FLEXTOUCH 100 UNIT/ML ~~LOC~~ SOPN: 15.0000 [IU] | PEN_INJECTOR | Freq: Two times a day (BID) | SUBCUTANEOUS | 3 refills | Status: DC

## 2022-03-22 MED ORDER — FLUOXETINE HCL 40 MG PO CAPS: 40.0000 mg | ORAL_CAPSULE | Freq: Every day | ORAL | 3 refills | Status: DC

## 2022-03-22 MED ORDER — TRAMADOL HCL 50 MG PO TABS: 50.0000 mg | ORAL_TABLET | Freq: Four times a day (QID) | ORAL | 0 refills | Status: DC | PRN

## 2022-03-22 NOTE — Discharge Instructions (Signed)
Glucose Products:  ReliOnT glucose products raise low blood sugar fast. Tablets are free of fat, caffeine, sodium and gluten. They are portable and easy to carry, making it easier for people with diabetes to BE PREPARED for lows.  Glucose Tablets Available in 6 flavors . 10 ct...................................... $1.00 . 50 ct...................................... $3.98 Glucose Shot..................................$1.48 Glucose Gel....................................$3.44  Alcohol Swabs Alcohol swabs are used to sterilize your injection site. All of our swabs are individually wrapped for maximum safety, convenience and moisture retention. ReliOnT Alcohol Swabs . 100 ct Swabs..............................$1.00 . 400 ct Swabs..............................$3.74  Lancets ReliOnT offers three lancet options conveniently designed to work with almost every lancing device. Each features a protective disk, which guarantees sterility before testing. ReliOnT Lancets . 100 ct Lancets $1.56 . 200 ct Lancets $2.64 Available in Ultra-Thin, Thin & Micro-Thin ReliOnT 2-IN-1 Lancing Device . 50 ct Lancets..................................... $3.44 Available in 30 gauge and 25 gauge ReliOnT Lancing Device....................$5.84  Blood Glucose Monitors ReliOnT offers a full range of blood glucose testing options to provide an accurate, affordable system that meets each person's unique needs and preferences. Prime Meter....................................... $9.00 Prime Test Strips . 25 test strips.................................... $5.00 . 50 test strips.................................... $9.00 . 100 test strips.................................$17.88 Premier BLU Meter  ............  $18.98 Premier Voice Meter  .............  $14.98 Premier Test Strips . 50 test strips.................................... $9.00 . 100 test strips.................................$17.88 Premier Compact Meter Kit   ............  $19.44 Kit includes: . 50 test strips . 10 lancets . Lancing device . Carry case  Ketone Test Strips . 50 test strips  ................  $6.64  Human Insulin  Novolin/ReliOnT (recombinant DNA origin) is manufactured for [employer] by Novo Nordisk Novolin/ReliOnT Insulin* with Vial..........$24.88 Available in N, R, 70/30 Novolin/ReliOnT Insulin Pens*  .....  $42.88 Available in N, R, 70/30  Insulin Delivery ReliOnT syringes and pen needles provide precision technology, comfort and accuracy in insulin delivery at affordable prices. ReliOnT Pen Needles* . 50 ct....................................................$9.00 Available in 4mm, 6mm, 8mm & 12mm ReliOnT Insulin Syringes* . 100 ct ............ $12.58 Available in 29G, 30G & 31G (3/10cc, 1/2cc & 1cc units)  

## 2022-03-22 NOTE — Inpatient Diabetes Management (Signed)
Inpatient Diabetes Program Recommendations  AACE/ADA: New Consensus Statement on Inpatient Glycemic Control (2015)  Target Ranges:  Prepandial:   less than 140 mg/dL      Peak postprandial:   less than 180 mg/dL (1-2 hours)      Critically ill patients:  140 - 180 mg/dL   Lab Results  Component Value Date   GLUCAP 204 (H) 03/22/2022   HGBA1C 10.0 (H) 03/21/2022    Review of Glycemic Control  Latest Reference Range & Units 03/21/22 03:11 03/21/22 07:20 03/21/22 11:34 03/21/22 16:37  Glucose-Capillary 70 - 99 mg/dL 638 (H) 453 (H) 646 (H) 367 (H)   Diabetes history: DM type 1 Outpatient Diabetes medications: Levemir 25 units qhs, Humalog sliding scale and meal coverage, 1 unit for every 20 points above target glucose of 120, 1 unit for every 8 grams of carbs.  Current orders for Inpatient glycemic control:  Levemir 15 units bid Novolog 0-9 units tid + hs  A1c 12.1% on 10/26/21 PCP: Hyman Hopes, NP  Inpatient Diabetes Program Recommendations:    Glucose trends increase after meal intake. Pt needs carbohydrate coverage since he has Type 1 DM.   -  Consider Novolog 5 units tid meal coverage if eating >50% of meals  Spoke with pt today. Pt reports being diagnosed with type 1 Diabetes at the age of 54. He had several pediatric Endocrinologists in the past. It has been a couple of years since he has had an Actor. Based on the regimen pt is on at home. He is reporting hypoglycemia at 3/4 am and in the mid afternoon. We reviewed his daily routines and insulin regimens and cannot find a clear pattern of the cause of his hypoglycemia. In addition to that, his A1c is chronically elevated per his report. Current A1c 10%. Pt Uses SQ insulin at home and CBGs. He has a prescription for Dexcom he will pickup. Pt ran out of Humalog and needs prescription however he said the copay was high. I told pt about the back up insulin from [facility] for him to get if in need of insulin like this in the  future.  D/C needs: Find what is covered more Humalog versus Novolog. High copay for Humalog per pt  Thanks,  Christena Deem RN, MSN, BC-ADM Inpatient Diabetes Coordinator Team Pager [phone] (8a-5p)

## 2022-03-22 NOTE — Discharge Summary (Signed)
Physician Discharge Summary   Patient: David Ramsey MRN: [id number] DOB: Mar 12, 1996  Admit date:     03/20/2022  Discharge date: 03/22/22  Discharge Physician: Annita Brod   PCP: Terrilyn Saver, NP   Recommendations at discharge:   Patient given new prescriptions for his Prozac and Synthroid which she had not been taking Patient given new prescription for his NovoLog KwikPen sliding scale Patient given new prescription for his Lantus pen New medication: Ultram 50 mg p.o. every 6 hours as needed for pain, 10 pills  Discharge Diagnoses: Active Problems:   Uncontrolled type 1 diabetes mellitus with hyperglycemia, with long-term current use of insulin (HCC)   AKI (acute kidney injury) ([place])   Essential hypertension   Hypothyroidism   Depression   Chronic pancreatitis ([place])   Overweight (BMI 25.0-29.9)  Principal Problem (Resolved):   DKA, type 1 [facility])  Hospital Course: Patient is a 26 year old male with past medical history of Hashimoto's disease, chronic pancreatitis and type 1 diabetes mellitus who presented to the emergency room on 7/14 with complaints of nausea and vomiting and left upper quadrant abdominal pain x4 days.  Patient was found to be in DKA with an anion gap of 24 and CBG of 662.  Patient tells me that he has been taking his long-acting insulin although he has been experiencing morning hypoglycemia followed by spiking blood sugars later on the day.  He also believes that his NovoLog for several months as the price had changed dramatically.  Patient was placed on insulin drip plus IV fluids.  Overnight, anion gap resolved and patient was started on Lantus and sliding scale.  Assessment and Plan: * DKA, type 1 (HCC)-resolved as of 03/21/2022 Status post IV fluids and insulin drip and DKA now resolved.  Uncontrolled type 1 diabetes mellitus with hyperglycemia, with long-term current use of insulin (HCC) A1c at 10.0.  Which is actually an improvement from 12 back  in February.  Seen by diabetes coordinator and patient reports hypoglycemia in the early morning hours and mid afternoon.  In review of his daily routines and insulin regimens, cannot find a clear pattern of the cause of his hypoglycemia.  Given his noncompliance with his SSRI and thyroid medications, certainly thyroid abnormalities and depression will affect his blood sugars and compliance.  Plan to discharge on Levemir 15 twice daily as well as NovoLog.  Patient had also stopped taking his NovoLog due to insurance issues and saying cost had dramatically increased.  He was listed as being on NovoLog 100 units KwikPen which was not on his formulary.  When this was changed to the NovoLog 200 unit KwikPen, cost was $15.  AKI (acute kidney injury) ([place]) Resolved with aggressive fluid resuscitation.  Creatinine at 1.62 on admission with GFR greater than 60 and by time of discharge, creatinine down to 1.11.  He likely does have some underlying stage I or II chronic kidney disease.  Essential hypertension - We will hold off Zestril given acute kidney injury and place on as needed IV labetalol.  Blood pressures have remained stable to soft so we will not continue his medication.  Patient had stopped taking it sometime ago.  Depression - We will continue Prozac.  Hypothyroidism - We will continue Synthroid.  Patient has not been taking his Synthroid.  Last month, TSH down to 42 but free T4 normal.  Likely subclinical hypothyroidism as well as noncompliance  Chronic pancreatitis ([place]) Stable exam left upper quadrant abdominal pain.  Chronic.  Stable  lipase and I suspect his pain may be more related to his hyperglycemia.  Ultram for pain.  Overweight (BMI 25.0-29.9) Meets criteria for BMI greater than 25        Pain control - [place] Controlled Substance Reporting System database was reviewed. and patient was instructed, not to drive, operate heavy machinery, perform activities at heights,  swimming or participation in water activities or provide baby-sitting services while on Pain, Sleep and Anxiety Medications; until their outpatient Physician has advised to do so again. Also recommended to not to take more than prescribed Pain, Sleep and Anxiety Medications.  Consultants: Diabetes coordinator Procedures performed: None Disposition: Home Diet recommendation:  Discharge Diet Orders (From admission, onward)     Start     Ordered   03/22/22 0000  Diet Carb Modified        03/22/22 1611           Carb modified diet DISCHARGE MEDICATION: Allergies as of 03/22/2022       Reactions   Tape Rash   Blisters- can use kerlix tape        Medication List     STOP taking these medications    insulin lispro 100 UNIT/ML KwikPen Commonly known as: HumaLOG KwikPen Replaced by: HumaLOG KwikPen 200 UNIT/ML KwikPen   lisinopril 5 MG tablet Commonly known as: ZESTRIL       TAKE these medications    albuterol 108 (90 Base) MCG/ACT inhaler Commonly known as: VENTOLIN HFA Inhale 2 puffs into the lungs every 6 (six) hours as needed for wheezing or shortness of breath.   blood glucose meter kit and supplies Kit Dispense based on patient and insurance preference. Use up to four times daily as directed.   Dexcom G6 Receiver Devi 1 each by Does not apply route daily.   Dexcom G6 Sensor Misc 1 each by Does not apply route daily.   FLUoxetine 40 MG capsule Commonly known as: PROZAC Take 1 capsule (40 mg total) by mouth daily.   gabapentin 400 MG capsule Commonly known as: Neurontin Take 1 capsule (400 mg total) by mouth 3 (three) times daily.   glucose blood test strip Use as instructed   HumaLOG KwikPen 200 UNIT/ML KwikPen Generic drug: insulin lispro Take 3 times a day with meals.  1 unit for CBG 121-150, 2 units for 151-200, 3 units for 201-250, 5 units for 251-300, 7 units for 301-350, 9 units for 351-400. Replaces: insulin lispro 100 UNIT/ML KwikPen    HYDROcodone-acetaminophen 5-325 MG tablet Commonly known as: NORCO/VICODIN Take 1 tablet by mouth every 4 (four) hours as needed for moderate pain.   Levemir FlexTouch 100 UNIT/ML FlexPen Generic drug: insulin detemir Inject 15 Units into the skin 2 (two) times daily.   levothyroxine 175 MCG tablet Commonly known as: SYNTHROID Take 1 tablet (175 mcg total) by mouth daily.   ondansetron 4 MG disintegrating tablet Commonly known as: ZOFRAN-ODT Take 1 tablet (4 mg total) by mouth every 8 (eight) hours as needed for nausea or vomiting.   traMADol 50 MG tablet Commonly known as: Ultram Take 1 tablet (50 mg total) by mouth every 6 (six) hours as needed.        Discharge Exam: Filed Weights   03/20/22 1547 03/20/22 2218  Weight: 74.8 kg 70.8 kg   General: Alert and oriented x3, no acute distress Cardiovascular: Regular rate and rhythm, S1-S2 Lungs: Clear to auscultation bilaterally  Condition at discharge: good  The results of significant diagnostics from this  hospitalization (including imaging, microbiology, ancillary and laboratory) are listed below for reference.   Imaging Studies: CT ABDOMEN PELVIS W CONTRAST  Result Date: 03/21/2022 CLINICAL DATA:  Abdominal pain. EXAM: CT ABDOMEN AND PELVIS WITH CONTRAST TECHNIQUE: Multidetector CT imaging of the abdomen and pelvis was performed using the standard protocol following bolus administration of intravenous contrast. RADIATION DOSE REDUCTION: This exam was performed according to the departmental dose-optimization program which includes automated exposure control, adjustment of the mA and/or kV according to patient size and/or use of iterative reconstruction technique. CONTRAST:  175m OMNIPAQUE IOHEXOL 300 MG/ML  SOLN COMPARISON:  CT of the pelvis dated 01/20/2022. FINDINGS: Lower chest: Minimal bibasilar atelectasis. No intra-abdominal free air or free fluid. Hepatobiliary: No focal liver abnormality is seen. No gallstones,  gallbladder wall thickening, or biliary dilatation. Pancreas: Areas of calcification involving the uncinate process of the pancreas as well as mild atrophy of the distal clavicle sequela of chronic pancreatitis. No active inflammatory changes. Spleen: Normal in size without focal abnormality. Adrenals/Urinary Tract: The adrenal glands are unremarkable. Mild renal parenchyma atrophy and areas of cortical scarring primarily involving the right kidney, likely sequela of chronic pyelonephritis. There is no hydronephrosis on either side. The visualized ureters and urinary bladder appear unremarkable. Stomach/Bowel: There is moderate stool throughout the colon. No bowel obstruction or active inflammation. The appendix is normal. Vascular/Lymphatic: The abdominal aorta and IVC are unremarkable. No portal venous gas. There is no adenopathy. Reproductive: The prostate and seminal vesicles are grossly unremarkable. No pelvic mass. Other: None Musculoskeletal: No acute or significant osseous findings. IMPRESSION: 1. No acute intra-abdominal or pelvic pathology. 2. Moderate colonic stool burden. No bowel obstruction. Normal appendix. Electronically Signed   By: AAnner CreteM.D.   On: 03/21/2022 02:09   DG Chest Portable 1 View  Result Date: 03/20/2022 CLINICAL DATA:  Shortness of breath EXAM: PORTABLE CHEST 1 VIEW COMPARISON:  02/23/2022 FINDINGS: Heart and mediastinal contours are within normal limits. No focal opacities or effusions. No acute bony abnormality. IMPRESSION: No active disease. Electronically Signed   By: KRolm BaptiseM.D.   On: 03/20/2022 17:19   DG Chest 2 View  Result Date: 02/23/2022 CLINICAL DATA:  Chest pain EXAM: CHEST - 2 VIEW COMPARISON:  Chest x-ray 01/20/2022 FINDINGS: Heart size and mediastinal contours are within normal limits. No suspicious pulmonary opacities identified. No pleural effusion or pneumothorax visualized. No acute osseous abnormality appreciated. IMPRESSION: No acute  intrathoracic process identified. Electronically Signed   By: DOfilia NeasM.D.   On: 02/23/2022 20:29    Microbiology: Results for orders placed or performed during the hospital encounter of 11/30/21  Resp Panel by RT-PCR (Flu A&B, Covid) Nasopharyngeal Swab     Status: None   Collection Time: 11/30/21  1:46 AM   Specimen: Nasopharyngeal Swab; Nasopharyngeal(NP) swabs in vial transport medium  Result Value Ref Range Status   SARS Coronavirus 2 by RT PCR NEGATIVE NEGATIVE Final    Comment: (NOTE) SARS-CoV-2 target nucleic acids are NOT DETECTED.  The SARS-CoV-2 RNA is generally detectable in upper respiratory specimens during the acute phase of infection. The lowest concentration of SARS-CoV-2 viral copies this assay can detect is 138 copies/mL. A negative result does not preclude SARS-Cov-2 infection and should not be used as the sole basis for treatment or other patient management decisions. A negative result may occur with  improper specimen collection/handling, submission of specimen other than nasopharyngeal swab, presence of viral mutation(s) within the areas targeted by this assay, and inadequate number  of viral copies(<138 copies/mL). A negative result must be combined with clinical observations, patient history, and epidemiological information. The expected result is Negative.  Fact Sheet for Patients:  EntrepreneurPulse.com.au  Fact Sheet for Healthcare Providers:  IncredibleEmployment.be  This test is no t yet approved or cleared by the [place] FDA and  has been authorized for detection and/or diagnosis of SARS-CoV-2 by FDA under an Emergency Use Authorization (EUA). This EUA will remain  in effect (meaning this test can be used) for the duration of the COVID-19 declaration under Section 564(b)(1) of the Act, 21 U.S.C.section 360bbb-3(b)(1), unless the authorization is terminated  or revoked sooner.       Influenza A by  PCR NEGATIVE NEGATIVE Final   Influenza B by PCR NEGATIVE NEGATIVE Final    Comment: (NOTE) The Xpert Xpress SARS-CoV-2/FLU/RSV plus assay is intended as an aid in the diagnosis of influenza from Nasopharyngeal swab specimens and should not be used as a sole basis for treatment. Nasal washings and aspirates are unacceptable for Xpert Xpress SARS-CoV-2/FLU/RSV testing.  Fact Sheet for Patients: EntrepreneurPulse.com.au  Fact Sheet for Healthcare Providers: IncredibleEmployment.be  This test is not yet approved or cleared by the [place] FDA and has been authorized for detection and/or diagnosis of SARS-CoV-2 by FDA under an Emergency Use Authorization (EUA). This EUA will remain in effect (meaning this test can be used) for the duration of the COVID-19 declaration under Section 564(b)(1) of the Act, 21 U.S.C. section 360bbb-3(b)(1), unless the authorization is terminated or revoked.  Performed at [facility], [address] [place], [place] [address]   Blood culture (routine x 2)     Status: None   Collection Time: 11/30/21  1:51 AM   Specimen: BLOOD  Result Value Ref Range Status   Specimen Description BLOOD LEFT ANTECUBITAL  Final   Special Requests   Final    BOTTLES DRAWN AEROBIC AND ANAEROBIC Blood Culture results may not be optimal due to an excessive volume of blood received in culture bottles   Culture   Final    NO GROWTH 5 DAYS Performed at [facility], [address] [place], [place] [address]    Report Status 12/05/2021 FINAL  Final  Blood culture (routine x 2)     Status: None   Collection Time: 11/30/21  1:51 AM   Specimen: BLOOD  Result Value Ref Range Status   Specimen Description BLOOD BLOOD LEFT HAND  Final   Special Requests   Final    BOTTLES DRAWN AEROBIC AND ANAEROBIC Blood Culture results may not be optimal due to an excessive volume of blood received in culture bottles   Culture   Final     NO GROWTH 5 DAYS Performed at [facility], [address] [place], [place] [address]    Report Status 12/05/2021 FINAL  Final    Labs: CBC: Recent Labs  Lab 03/16/22 1224 03/20/22 1555  WBC 7.8 8.6  HGB 14.5 15.0  HCT 42.3 45.5  MCV 90.5 90.6  PLT 412.0* 916*   Basic Metabolic Panel: Recent Labs  Lab 03/20/22 1555 03/20/22 1920 03/21/22 0119 03/21/22 0443 03/21/22 0921 03/21/22 1356 03/22/22 1238  NA 134*   < > 139 135 135 134* 139  K 5.1   < > 4.0 3.8 3.4* 3.6 4.0  CL 92*   < > 104 102 102 100 108  CO2 18*   < > _0 GLUCOSE 662*   < >  147* 164* 148* 270* 172*  BUN 21*   < > _0 CREATININE 1.62*   < > 1.14 1.14 1.10 1.25* 1.11  CALCIUM 9.6   < > 8.6* 8.7* 8.5* 8.6* 8.3*  MG 2.5*  --   --   --   --   --   --   PHOS 4.2  --   --   --   --   --   --    < > = values in this interval not displayed.   Liver Function Tests: Recent Labs  Lab 03/16/22 1224 03/20/22 1555  AST 23 24  ALT 29 31  ALKPHOS 81 85  BILITOT 1.2 2.7*  PROT 7.4 8.2*  ALBUMIN 4.6 4.6   CBG: Recent Labs  Lab 03/21/22 2102 03/22/22 0529 03/22/22 0603 03/22/22 0847 03/22/22 1114  GLUCAP 172* 59* 83 204* 181*    Discharge time spent: greater than 30 minutes.  Signed: Annita Brod, MD [facility] 03/22/2022

## 2022-03-22 NOTE — TOC Transition Note (Signed)
Transition of Care [facility]) - CM/SW Discharge Note   Patient Details  Name: David Ramsey MRN: [id number] Date of Birth: [birth year]  Transition of [facility] CM/SW Contact:  Liliana Cline, LCSW Phone Number: 03/22/2022, 4:16 PM   Clinical Narrative:    Patient is discharging home. Pharmacist has coordinated with the Pharmacy to confirm his insulin copay is $15 as it was previously.    Final next level of care: Home/Self Care Barriers to Discharge: Barriers Resolved   Patient Goals and CMS Choice Patient states their goals for this hospitalization and ongoing recovery are:: to return home CMS Medicare.gov Compare Post Acute Care list provided to:: Patient Choice offered to / list presented to : Patient  Discharge Placement                       Discharge Plan and Services                                     Social Determinants of Health (SDOH) Interventions     Readmission Risk Interventions    03/21/2022   11:18 AM  Readmission Risk Prevention Plan  Transportation Screening Complete  PCP or Specialist Appt within 3-5 Days Complete  HRI or Home Care Consult Complete  Social Work Consult for Recovery Care Planning/Counseling Complete  Palliative Care Screening Not Applicable  Medication Review [facility]) Complete

## 2022-04-11 ENCOUNTER — Emergency Department: Payer: BC Managed Care – PPO

## 2022-04-11 ENCOUNTER — Encounter: Payer: Self-pay | Admitting: Radiology

## 2022-04-11 ENCOUNTER — Other Ambulatory Visit: Payer: Self-pay

## 2022-04-11 ENCOUNTER — Inpatient Hospital Stay
Admission: EM | Admit: 2022-04-11 | Discharge: 2022-04-14 | DRG: 637 | Disposition: A | Discharge status: Home / Self Care | Payer: BC Managed Care – PPO | Attending: Internal Medicine | Admitting: Internal Medicine

## 2022-04-11 DIAGNOSIS — E871 Hypo-osmolality and hyponatremia: Secondary | ICD-10-CM | POA: Diagnosis not present

## 2022-04-11 DIAGNOSIS — Z79891 Long term (current) use of opiate analgesic: Secondary | ICD-10-CM | POA: Diagnosis not present

## 2022-04-11 DIAGNOSIS — E875 Hyperkalemia: Secondary | ICD-10-CM | POA: Diagnosis present

## 2022-04-11 DIAGNOSIS — Z91048 Other nonmedicinal substance allergy status: Secondary | ICD-10-CM

## 2022-04-11 DIAGNOSIS — K859 Acute pancreatitis without necrosis or infection, unspecified: Secondary | ICD-10-CM | POA: Diagnosis not present

## 2022-04-11 DIAGNOSIS — K219 Gastro-esophageal reflux disease without esophagitis: Secondary | ICD-10-CM | POA: Diagnosis not present

## 2022-04-11 DIAGNOSIS — R0689 Other abnormalities of breathing: Secondary | ICD-10-CM | POA: Diagnosis not present

## 2022-04-11 DIAGNOSIS — E860 Dehydration: Secondary | ICD-10-CM | POA: Diagnosis present

## 2022-04-11 DIAGNOSIS — R1114 Bilious vomiting: Secondary | ICD-10-CM | POA: Diagnosis not present

## 2022-04-11 DIAGNOSIS — R7989 Other specified abnormal findings of blood chemistry: Secondary | ICD-10-CM | POA: Diagnosis present

## 2022-04-11 DIAGNOSIS — E10649 Type 1 diabetes mellitus with hypoglycemia without coma: Secondary | ICD-10-CM | POA: Diagnosis not present

## 2022-04-11 DIAGNOSIS — E039 Hypothyroidism, unspecified: Secondary | ICD-10-CM | POA: Diagnosis not present

## 2022-04-11 DIAGNOSIS — T383X6A Underdosing of insulin and oral hypoglycemic [antidiabetic] drugs, initial encounter: Secondary | ICD-10-CM | POA: Diagnosis present

## 2022-04-11 DIAGNOSIS — Z794 Long term (current) use of insulin: Secondary | ICD-10-CM

## 2022-04-11 DIAGNOSIS — A419 Sepsis, unspecified organism: Secondary | ICD-10-CM | POA: Diagnosis present

## 2022-04-11 DIAGNOSIS — R112 Nausea with vomiting, unspecified: Secondary | ICD-10-CM | POA: Diagnosis present

## 2022-04-11 DIAGNOSIS — R109 Unspecified abdominal pain: Secondary | ICD-10-CM | POA: Diagnosis present

## 2022-04-11 DIAGNOSIS — F17290 Nicotine dependence, other tobacco product, uncomplicated: Secondary | ICD-10-CM | POA: Diagnosis present

## 2022-04-11 DIAGNOSIS — E1010 Type 1 diabetes mellitus with ketoacidosis without coma: Principal | ICD-10-CM | POA: Diagnosis present

## 2022-04-11 DIAGNOSIS — Z0389 Encounter for observation for other suspected diseases and conditions ruled out: Secondary | ICD-10-CM | POA: Diagnosis not present

## 2022-04-11 DIAGNOSIS — F419 Anxiety disorder, unspecified: Secondary | ICD-10-CM | POA: Diagnosis present

## 2022-04-11 DIAGNOSIS — K861 Other chronic pancreatitis: Secondary | ICD-10-CM | POA: Diagnosis not present

## 2022-04-11 DIAGNOSIS — R4182 Altered mental status, unspecified: Secondary | ICD-10-CM | POA: Diagnosis not present

## 2022-04-11 DIAGNOSIS — F32A Depression, unspecified: Secondary | ICD-10-CM | POA: Diagnosis not present

## 2022-04-11 DIAGNOSIS — Z79899 Other long term (current) drug therapy: Secondary | ICD-10-CM

## 2022-04-11 DIAGNOSIS — R739 Hyperglycemia, unspecified: Secondary | ICD-10-CM | POA: Diagnosis not present

## 2022-04-11 DIAGNOSIS — Z79890 Hormone replacement therapy (postmenopausal): Secondary | ICD-10-CM | POA: Diagnosis not present

## 2022-04-11 DIAGNOSIS — Z833 Family history of diabetes mellitus: Secondary | ICD-10-CM | POA: Diagnosis not present

## 2022-04-11 DIAGNOSIS — R935 Abnormal findings on diagnostic imaging of other abdominal regions, including retroperitoneum: Secondary | ICD-10-CM | POA: Diagnosis not present

## 2022-04-11 DIAGNOSIS — N179 Acute kidney failure, unspecified: Secondary | ICD-10-CM | POA: Diagnosis not present

## 2022-04-11 DIAGNOSIS — Z20822 Contact with and (suspected) exposure to covid-19: Secondary | ICD-10-CM | POA: Diagnosis present

## 2022-04-11 DIAGNOSIS — R100 Acute abdomen: Secondary | ICD-10-CM | POA: Diagnosis not present

## 2022-04-11 DIAGNOSIS — Z8614 Personal history of Methicillin resistant Staphylococcus aureus infection: Secondary | ICD-10-CM | POA: Diagnosis not present

## 2022-04-11 DIAGNOSIS — Z91148 Patient's other noncompliance with medication regimen for other reason: Secondary | ICD-10-CM

## 2022-04-11 DIAGNOSIS — E1042 Type 1 diabetes mellitus with diabetic polyneuropathy: Secondary | ICD-10-CM | POA: Diagnosis not present

## 2022-04-11 DIAGNOSIS — R000 Tachycardia, unspecified: Secondary | ICD-10-CM | POA: Diagnosis not present

## 2022-04-11 DIAGNOSIS — R1084 Generalized abdominal pain: Secondary | ICD-10-CM | POA: Diagnosis not present

## 2022-04-11 DIAGNOSIS — I10 Essential (primary) hypertension: Secondary | ICD-10-CM | POA: Diagnosis present

## 2022-04-11 DIAGNOSIS — K8689 Other specified diseases of pancreas: Secondary | ICD-10-CM | POA: Diagnosis not present

## 2022-04-11 DIAGNOSIS — E8729 Other acidosis: Secondary | ICD-10-CM | POA: Diagnosis present

## 2022-04-11 LAB — CBG MONITORING, ED
Glucose-Capillary: >600 mg/dL (ref 70–99)
Glucose-Capillary: 455 mg/dL — ABNORMAL HIGH (ref 70–99)
Glucose-Capillary: 385 mg/dL — ABNORMAL HIGH (ref 70–99)
Glucose-Capillary: 283 mg/dL — ABNORMAL HIGH (ref 70–99)
Glucose-Capillary: 154 mg/dL — ABNORMAL HIGH (ref 70–99)

## 2022-04-11 LAB — BLOOD GAS, VENOUS
Acid-base deficit: 23.8 mmol/L — ABNORMAL HIGH (ref 0.0–2.0)
Acid-base deficit: 16.8 mmol/L — ABNORMAL HIGH (ref 0.0–2.0)
Bicarbonate: 4.3 mmol/L — ABNORMAL LOW (ref 20.0–28.0)
Bicarbonate: 10.8 mmol/L — ABNORMAL LOW (ref 20.0–28.0)
O2 Saturation: 97.8 %
O2 Saturation: 77.3 %
Patient temperature: 37
Patient temperature: 37
pCO2, Ven: <18 mmHg — CL (ref 44–60)
pCO2, Ven: 31 mmHg — ABNORMAL LOW (ref 44–60)
pH, Ven: 7.07 — CL (ref 7.25–7.43)
pH, Ven: 7.15 — CL (ref 7.25–7.43)
pO2, Ven: 97 mmHg — ABNORMAL HIGH (ref 32–45)
pO2, Ven: 48 mmHg — ABNORMAL HIGH (ref 32–45)

## 2022-04-11 LAB — BASIC METABOLIC PANEL
Anion gap: 24 — ABNORMAL HIGH (ref 5–15)
Anion gap: 19 — ABNORMAL HIGH (ref 5–15)
BUN: 39 mg/dL — ABNORMAL HIGH (ref 6–20)
BUN: 30 mg/dL — ABNORMAL HIGH (ref 6–20)
CO2: 11 mmol/L — ABNORMAL LOW (ref 22–32)
CO2: 12 mmol/L — ABNORMAL LOW (ref 22–32)
Calcium: 9.7 mg/dL (ref 8.9–10.3)
Calcium: 9.6 mg/dL (ref 8.9–10.3)
Chloride: 102 mmol/L (ref 98–111)
Chloride: 109 mmol/L (ref 98–111)
Creatinine, Ser: 1.78 mg/dL — ABNORMAL HIGH (ref 0.61–1.24)
Creatinine, Ser: 1.36 mg/dL — ABNORMAL HIGH (ref 0.61–1.24)
GFR, Estimated: 54 mL/min — ABNORMAL LOW (ref >60)
GFR, Estimated: >60 mL/min (ref >60)
Glucose, Bld: 357 mg/dL — ABNORMAL HIGH (ref 70–99)
Glucose, Bld: 255 mg/dL — ABNORMAL HIGH (ref 70–99)
Potassium: 4.4 mmol/L (ref 3.5–5.1)
Potassium: 4.9 mmol/L (ref 3.5–5.1)
Sodium: 137 mmol/L (ref 135–145)
Sodium: 140 mmol/L (ref 135–145)

## 2022-04-11 LAB — URINALYSIS, COMPLETE (UACMP) WITH MICROSCOPIC
Bilirubin Urine: NEGATIVE
Glucose, UA: >=500 mg/dL — AB
Hgb urine dipstick: NEGATIVE
Ketones, ur: 80 mg/dL — AB
Leukocytes,Ua: NEGATIVE
Nitrite: NEGATIVE
Protein, ur: NEGATIVE mg/dL
Specific Gravity, Urine: 1.017 (ref 1.005–1.030)
pH: 5 (ref 5.0–8.0)

## 2022-04-11 LAB — CBC
HCT: 45 % (ref 39.0–52.0)
Hemoglobin: 14.4 g/dL (ref 13.0–17.0)
MCH: 30 pg (ref 26.0–34.0)
MCHC: 32 g/dL (ref 30.0–36.0)
MCV: 93.8 fL (ref 80.0–100.0)
Platelets: 533 10*3/uL — ABNORMAL HIGH (ref 150–400)
RBC: 4.8 MIL/uL (ref 4.22–5.81)
RDW: 11.9 % (ref 11.5–15.5)
WBC: 18.9 10*3/uL — ABNORMAL HIGH (ref 4.0–10.5)
nRBC: 0 % (ref 0.0–0.2)

## 2022-04-11 LAB — MAGNESIUM: Magnesium: 2.5 mg/dL — ABNORMAL HIGH (ref 1.7–2.4)

## 2022-04-11 LAB — SARS CORONAVIRUS 2 BY RT PCR: SARS Coronavirus 2 by RT PCR: NEGATIVE

## 2022-04-11 LAB — COMPREHENSIVE METABOLIC PANEL
ALT: 68 U/L — ABNORMAL HIGH (ref 0–44)
AST: 27 U/L (ref 15–41)
Albumin: 4.1 g/dL (ref 3.5–5.0)
Alkaline Phosphatase: 146 U/L — ABNORMAL HIGH (ref 38–126)
Anion gap: 25 — ABNORMAL HIGH (ref 5–15)
BUN: 38 mg/dL — ABNORMAL HIGH (ref 6–20)
CO2: 14 mmol/L — ABNORMAL LOW (ref 22–32)
Calcium: 10 mg/dL (ref 8.9–10.3)
Chloride: 93 mmol/L — ABNORMAL LOW (ref 98–111)
Creatinine, Ser: 1.71 mg/dL — ABNORMAL HIGH (ref 0.61–1.24)
GFR, Estimated: 57 mL/min — ABNORMAL LOW (ref >60)
Glucose, Bld: 695 mg/dL (ref 70–99)
Potassium: 6.8 mmol/L (ref 3.5–5.1)
Sodium: 132 mmol/L — ABNORMAL LOW (ref 135–145)
Total Bilirubin: 3 mg/dL — ABNORMAL HIGH (ref 0.3–1.2)
Total Protein: 7.9 g/dL (ref 6.5–8.1)

## 2022-04-11 LAB — URINE DRUG SCREEN, QUALITATIVE (ARMC ONLY)
Amphetamines, Ur Screen: NOT DETECTED
Barbiturates, Ur Screen: NOT DETECTED
Benzodiazepine, Ur Scrn: NOT DETECTED
Cannabinoid 50 Ng, Ur ~~LOC~~: NOT DETECTED
Cocaine Metabolite,Ur ~~LOC~~: NOT DETECTED
MDMA (Ecstasy)Ur Screen: NOT DETECTED
Methadone Scn, Ur: NOT DETECTED
Opiate, Ur Screen: NOT DETECTED
Phencyclidine (PCP) Ur S: NOT DETECTED
Tricyclic, Ur Screen: NOT DETECTED

## 2022-04-11 LAB — ETHANOL: Alcohol, Ethyl (B): <10 mg/dL (ref <10)

## 2022-04-11 LAB — APTT: aPTT: 28 seconds (ref 24–36)

## 2022-04-11 LAB — PROCALCITONIN: Procalcitonin: 2.29 ng/mL

## 2022-04-11 LAB — MRSA NEXT GEN BY PCR, NASAL: MRSA by PCR Next Gen: NOT DETECTED

## 2022-04-11 LAB — LACTIC ACID, PLASMA
Lactic Acid, Venous: 2.6 mmol/L (ref 0.5–1.9)
Lactic Acid, Venous: 1.9 mmol/L (ref 0.5–1.9)

## 2022-04-11 LAB — LIPASE, BLOOD: Lipase: 230 U/L — ABNORMAL HIGH (ref 11–51)

## 2022-04-11 LAB — PROTIME-INR
INR: 1.2 (ref 0.8–1.2)
Prothrombin Time: 15 seconds (ref 11.4–15.2)

## 2022-04-11 LAB — BETA-HYDROXYBUTYRIC ACID: Beta-Hydroxybutyric Acid: >8 mmol/L — ABNORMAL HIGH (ref 0.05–0.27)

## 2022-04-11 LAB — GLUCOSE, CAPILLARY
Glucose-Capillary: 176 mg/dL — ABNORMAL HIGH (ref 70–99)
Glucose-Capillary: 237 mg/dL — ABNORMAL HIGH (ref 70–99)
Glucose-Capillary: 238 mg/dL — ABNORMAL HIGH (ref 70–99)

## 2022-04-11 LAB — TSH: TSH: 0.445 u[IU]/mL (ref 0.350–4.500)

## 2022-04-11 MED ORDER — SODIUM CHLORIDE 0.9 % IV SOLN
2.0000 g | Freq: Three times a day (TID) | INTRAVENOUS | Status: DC
  Administered 2022-04-12 – 2022-04-13 (×4): 2 g via INTRAVENOUS
  Filled 2022-04-11: qty 2
  Filled 2022-04-11 (×2): qty 12.5
  Filled 2022-04-11: qty 2
  Filled 2022-04-11: qty 12.5
  Filled 2022-04-11: qty 2

## 2022-04-11 MED ORDER — IOHEXOL 300 MG/ML  SOLN
100.0000 mL | Freq: Once | INTRAMUSCULAR | Status: AC | PRN
  Administered 2022-04-11: 100 mL via INTRAVENOUS

## 2022-04-11 MED ORDER — LACTATED RINGERS IV BOLUS
1000.0000 mL | Freq: Once | INTRAVENOUS | Status: AC
  Administered 2022-04-11: 1000 mL via INTRAVENOUS

## 2022-04-11 MED ORDER — CEFEPIME HCL 2 G IV SOLR
2.0000 g | Freq: Once | INTRAVENOUS | Status: AC
  Administered 2022-04-11: 2 g via INTRAVENOUS
  Filled 2022-04-11: qty 12.5

## 2022-04-11 MED ORDER — DEXTROSE 50 % IV SOLN
0.0000 mL | INTRAVENOUS | Status: DC | PRN
  Administered 2022-04-13: 25 mL via INTRAVENOUS
  Filled 2022-04-11: qty 50

## 2022-04-11 MED ORDER — CALCIUM GLUCONATE-NACL 1-0.675 GM/50ML-% IV SOLN
1.0000 g | Freq: Once | INTRAVENOUS | Status: AC
  Administered 2022-04-11: 1000 mg via INTRAVENOUS
  Filled 2022-04-11: qty 50

## 2022-04-11 MED ORDER — LACTATED RINGERS IV SOLN
INTRAVENOUS | Status: DC
Start: 2022-04-11 — End: 2022-04-14

## 2022-04-11 MED ORDER — MORPHINE SULFATE (PF) 2 MG/ML IV SOLN
2.0000 mg | INTRAVENOUS | Status: DC | PRN
  Administered 2022-04-11: 2 mg via INTRAVENOUS
  Filled 2022-04-11: qty 1

## 2022-04-11 MED ORDER — DEXTROSE IN LACTATED RINGERS 5 % IV SOLN: INTRAVENOUS | Status: DC

## 2022-04-11 MED ORDER — ONDANSETRON HCL 4 MG/2ML IJ SOLN
4.0000 mg | Freq: Four times a day (QID) | INTRAMUSCULAR | Status: DC | PRN
  Administered 2022-04-11 – 2022-04-13 (×4): 4 mg via INTRAVENOUS
  Filled 2022-04-11 (×4): qty 2

## 2022-04-11 MED ORDER — VANCOMYCIN VARIABLE DOSE PER UNSTABLE RENAL FUNCTION (PHARMACIST DOSING)
Status: DC
Start: 2022-04-11 — End: 2022-04-13

## 2022-04-11 MED ORDER — SODIUM CHLORIDE 0.9 % IV BOLUS
1000.0000 mL | Freq: Once | INTRAVENOUS | Status: AC
  Administered 2022-04-11: 1000 mL via INTRAVENOUS

## 2022-04-11 MED ORDER — SODIUM BICARBONATE 8.4 % IV SOLN
50.0000 meq | Freq: Once | INTRAVENOUS | Status: AC
  Administered 2022-04-11: 50 meq via INTRAVENOUS
  Filled 2022-04-11: qty 50

## 2022-04-11 MED ORDER — VANCOMYCIN HCL 1500 MG/300ML IV SOLN
1500.0000 mg | Freq: Once | INTRAVENOUS | Status: AC
  Administered 2022-04-11: 1500 mg via INTRAVENOUS
  Filled 2022-04-11: qty 300

## 2022-04-11 MED ORDER — INSULIN REGULAR(HUMAN) IN NACL 100-0.9 UT/100ML-% IV SOLN
INTRAVENOUS | Status: DC
  Administered 2022-04-11: 10.5 [IU]/h via INTRAVENOUS
  Administered 2022-04-12: 3.8 [IU]/h via INTRAVENOUS
  Administered 2022-04-12: 3.4 [IU]/h via INTRAVENOUS
  Filled 2022-04-11 (×2): qty 100

## 2022-04-11 NOTE — Assessment & Plan Note (Addendum)
Cont levothyroxine at 175 mcg.  Normal TSH.  

## 2022-04-11 NOTE — Assessment & Plan Note (Addendum)
Elevated lipase 230, recheck lipase, check amylase.  Start clear liquid diet provide symptomatic management

## 2022-04-11 NOTE — Progress Notes (Signed)
CODE SEPSIS - PHARMACY COMMUNICATION  **Broad Spectrum Antibiotics should be administered within 1 hour of Sepsis diagnosis**  Time Code Sepsis Called/Page Received: 2036  Antibiotics Ordered: vancomycin and cefepime  Time of 1st antibiotic administration: 1753  Additional action taken by pharmacy: none  If necessary, Name of Provider/Nurse Contacted: n/a    Elliot Gurney, PharmD Clinical Pharmacist  04/11/2022 8:28 PM

## 2022-04-11 NOTE — ED Triage Notes (Signed)
Pt to ED via [facility] from home. Pt type 1 diabetic and hasn't had insulin in 3 days. EMS CBG <600. Pt given 500cc NS in route. Pt responsive to painful stimuli. Pt lives with fried who called EMS. Pt HR 110-120s.

## 2022-04-11 NOTE — Assessment & Plan Note (Addendum)
Blood pressure 131/61, pulse (!) 121, temperature 97.7 F (36.5 C), temperature source Oral, resp. rate (!) 27, height 5\' 6"  (1.676 m), weight 70.8 kg, SpO2 99 %.  Blood pressure is much better controlled

## 2022-04-11 NOTE — Assessment & Plan Note (Addendum)
Monitor liver function test    Component Value Date/Time   PROT 7.9 04/11/2022 1456   ALBUMIN 4.1 04/11/2022 1456   AST 27 04/11/2022 1456   ALT 68 (H) 04/11/2022 1456   ALKPHOS 146 (H) 04/11/2022 1456   BILITOT 3.0 (H) 04/11/2022 1456   BILIDIR 0.2 03/20/2022 1555   IBILI 2.5 (H) 03/20/2022 1555  Sepsis ruled out

## 2022-04-11 NOTE — Assessment & Plan Note (Addendum)
    Latest Ref Rng & Units 04/11/2022    6:31 PM 04/11/2022    2:56 PM 03/22/2022   12:38 PM  BMP  Glucose 70 - 99 mg/dL 761  607  371   BUN 6 - 20 mg/dL 39  38  8   Creatinine 0.61 - 1.24 mg/dL 0.62  6.94  8.54   Sodium 135 - 145 mmol/L 137  132  139   Potassium 3.5 - 5.1 mmol/L 4.4  6.8  4.0   Chloride 98 - 111 mmol/L 102  93  108   CO2 22 - 32 mmol/L 11  14  28    Calcium 8.9 - 10.3 mg/dL 9.7   8.3   resolved with ivf and insulin . Likely due to imbalance from DKA

## 2022-04-11 NOTE — Progress Notes (Addendum)
26 y.o male with recurrent episode of DKA. The laboratory data showed an anion gap metabolic acidosis, and hyperglycemia consistent with the diagnosis of DKA. Urinalysis and imaging demonstrated no evidence of infection. The patient's hemoglobin A1c (A1C) pending. The management was initiated with initial intravenous fluid, electrolytes replacement, intravenous (IV)  empiric abx, and intravenous insulin infusion as per DKA protocol. Serum electrolytes were closely monitored. PCCM consulted to admit for further management of DKA however at t his time patient does not require any critical care needs. He is alert and oriented x 3, maintaining his airway and hemodynamically stable. PCCM will sign off. Please re-consult with any critical needs.   Webb Silversmith, DNP, CCRN, FNP-C, AGACNP-BC Acute Care & Family Nurse Practitioner  [facility]  See Amion for personal pager PCCM on call pager [phone] until 7 am

## 2022-04-11 NOTE — Progress Notes (Signed)
Pharmacy Antibiotic Note  David Ramsey is a 26 y.o. male admitted on 04/11/2022 with sepsis.  Pharmacy has been consulted for vancomycin and cefepime dosing.  Admitted with DKA. Also presented septic with leukocytosis. Vancomycin 1500 mg given @1814  and cefepime 2 grams given @1753 .  Plan: Received loading dose of vancomycin.Currently in AKI. Estimated t1/2 13.2 hours with expected dosing interval of Q24 hours based on current renal function. Will plan to assess creatinine in the AM to determine vancomycin maintenance dose.  Cefepime 2 grams every 8 hours   Monitor renal function and de-escalation/clinical course.  Height: 5\' 6"  (167.6 cm) Weight: 70.8 kg (156 lb 1.4 oz) IBW/kg (Calculated) : 63.8  Temp (24hrs), Avg:97.5 F (36.4 C), Min:97.2 F (36.2 C), Max:97.7 F (36.5 C)  Recent Labs  Lab 04/11/22 1456 04/11/22 1526 04/11/22 1828 04/11/22 1831  WBC 18.9*  --   --   --   CREATININE 1.71*  --   --  1.78*  LATICACIDVEN  --  2.6* 1.9  --     Estimated Creatinine Clearance: 57.7 mL/min (A) (by C-G formula based on SCr of 1.78 mg/dL (H)).    Allergies  Allergen Reactions   Tape Rash    Blisters- can use kerlix tape    Antimicrobials this admission: Vancomycin 8/5 >>  Cefepime 8/5 >>   Dose adjustments this admission: N/a  Microbiology results: 8/5 BCx: in process  Thank you for allowing pharmacy to be a part of this patient's care.  06/11/22, PharmD Clinical Pharmacist  04/11/2022 8:33 PM

## 2022-04-11 NOTE — H&P (Signed)
History and Physical    David Ramsey:[id number] DOB: 04-02-1996 DOA: 04/11/2022  PCP: Terrilyn Saver, NP    Patient coming from:  Home    Chief Complaint:  Nausea vomiting.    HPI:  David Ramsey is a 26 y.o. male seen in ed with complaints of N/V. Patient is not able to give me history does not open his eyes says he is nauseated lets me examine him minimally briefly is not uncooperative, just feels terribly ill. He is tachycardic and mild to moderate distress from abdominal pain and nausea.  Nurse at bedside Ms. Anderson Malta is trying obtain an additional IV to give IV fluid boluses ordered. HPI is therefore per chart review.  However patient does deny headaches blurred vision speech or gait issues fevers chills  diarrhea he does endorse nausea, vomiting, abdominal pain.  Initially was very lethargic and dehydrated. Patient has ran out of his insulin about 3 days ago.  Pt has past medical history of allergy to tape, diabetes mellitus type 1, DKA, acute kidney injury, Hashimoto's disease , pneumonia.  ED Course:   Vitals:   04/11/22 1925 04/11/22 1927 04/11/22 1930 04/11/22 2045  BP: 135/71  135/77 131/61  Pulse: (!) 124  (!) 115 (!) 121  Resp:   14 (!) 27  Temp:  97.7 F (36.5 C)    TempSrc:  Oral    SpO2: 99%  100% 99%  Weight:      Height:      In the emergency room patient is septic and meet sepsis criteria with heart rate respiratory rate elevated lactic, white count of 18,000. Blood work additionally shows platelet counts of 533 hyponatremia with a sodium of 132 hyperkalemia with a potassium of  6.8, creatinine of 1.7, alk phos of 146, total bili of 3.0. Initial EKG shows sinus tachycardia with peaked T waves in lead V2 and V3. CT head negative for any acute intracranial process, ultrasound of the gallbladder negative for any gallstones or common bile duct dilatation. CT of the abdomen negative for any cholecystitis diverticulitis.   Review of Systems:  Review of  Systems  Gastrointestinal:  Positive for abdominal pain, nausea and vomiting.  All other systems reviewed and are negative.    Past Medical History:  Diagnosis Date   AKI (acute kidney injury) ([place]) 12/16/2016   Diabetes mellitus without complication ([place])    INSULIN DEPENDENT   SINCE AGE 29   Diabetic polyneuropathy associated with type 1 diabetes mellitus ([place]) 11/30/2021   DKA (diabetic ketoacidoses) 02/17/2017   Hashimoto's disease    MRSA infection    Pneumonia    Liver contusion, lung effusion, sepsis multiorgan failure, no clear source    Past Surgical History:  Procedure Laterality Date   CHEST TUBE INSERTION       reports that he quit smoking about 4 years ago. His smoking use included cigarettes. He has a 2.00 pack-year smoking history. He has never used smokeless tobacco. He reports current alcohol use. He reports that he does not use drugs.  Allergies  Allergen Reactions   Tape Rash    Blisters- can use kerlix tape    Family History  Problem Relation Age of Onset   Diabetes Cousin    Heart disease Other    Cancer Other    Thyroid disease Other    Healthy Mother     Prior to Admission medications   Medication Sig Start Date End Date Taking? Authorizing Provider  albuterol (VENTOLIN HFA)  108 (90 Base) MCG/ACT inhaler Inhale 2 puffs into the lungs every 6 (six) hours as needed for wheezing or shortness of breath. 11/08/21   Loletha Grayer, MD  blood glucose meter kit and supplies KIT Dispense based on patient and insurance preference. Use up to four times daily as directed. 11/03/20   Raiford Noble Latif, DO  Continuous Blood Gluc Receiver ([place]) [place] 1 each by Does not apply route daily. 03/16/22   Terrilyn Saver, NP  Continuous Blood Gluc Sensor (DEXCOM G6 SENSOR) MISC 1 each by Does not apply route daily. 03/16/22   Terrilyn Saver, NP  FLUoxetine (PROZAC) 40 MG capsule Take 1 capsule (40 mg total) by mouth daily. 03/22/22   Annita Brod, MD   gabapentin (NEURONTIN) 400 MG capsule Take 1 capsule (400 mg total) by mouth 3 (three) times daily. 03/16/22 04/15/22  Caleen Jobs B, NP  glucose blood test strip Use as instructed 01/22/22   Wyvonnia Dusky, MD  HYDROcodone-acetaminophen (NORCO/VICODIN) 5-325 MG tablet Take 1 tablet by mouth every 4 (four) hours as needed for moderate pain. 02/23/22 02/23/23  Harvest Dark, MD  insulin detemir (LEVEMIR FLEXTOUCH) 100 UNIT/ML FlexPen Inject 15 Units into the skin 2 (two) times daily. 03/22/22   Annita Brod, MD  insulin lispro (HUMALOG KWIKPEN) 200 UNIT/ML KwikPen Take 3 times a day with meals.  1 unit for CBG 121-150, 2 units for 151-200, 3 units for 201-250, 5 units for 251-300, 7 units for 301-350, 9 units for 351-400. 03/22/22   Annita Brod, MD  levothyroxine (SYNTHROID) 175 MCG tablet Take 1 tablet (175 mcg total) by mouth daily. 03/22/22   Annita Brod, MD  ondansetron (ZOFRAN-ODT) 4 MG disintegrating tablet Take 1 tablet (4 mg total) by mouth every 8 (eight) hours as needed for nausea or vomiting. 02/23/22   Harvest Dark, MD  traMADol (ULTRAM) 50 MG tablet Take 1 tablet (50 mg total) by mouth every 6 (six) hours as needed. 03/22/22 03/22/23  Annita Brod, MD  pantoprazole (PROTONIX) 40 MG tablet Take 1 tablet (40 mg total) by mouth daily. 02/18/17 06/06/20  Jonetta Osgood, MD    Physical Exam: Vitals:   04/11/22 1925 04/11/22 1927 04/11/22 1930 04/11/22 2045  BP: 135/71  135/77 131/61  Pulse: (!) 124  (!) 115 (!) 121  Resp:   14 (!) 27  Temp:  97.7 F (36.5 C)    TempSrc:  Oral    SpO2: 99%  100% 99%  Weight:      Height:       Physical Exam Vitals and nursing note reviewed.  Constitutional:      General: He is not in acute distress.    Appearance: He is ill-appearing. He is not toxic-appearing or diaphoretic.  HENT:     Head: Normocephalic and atraumatic.     Right Ear: Hearing and external ear normal.     Left Ear: Hearing and external ear  normal.     Nose: Nose normal. No nasal deformity.     Mouth/Throat:     Lips: Pink.     Mouth: Mucous membranes are dry.     Tongue: No lesions.     Pharynx: Oropharynx is clear.  Cardiovascular:     Rate and Rhythm: Regular rhythm. Tachycardia present.     Pulses: Normal pulses.     Heart sounds: Normal heart sounds.  Pulmonary:     Effort: Pulmonary effort is normal.  Breath sounds: Normal breath sounds.  Abdominal:     General: Bowel sounds are normal. There is no distension.     Palpations: Abdomen is soft. There is no mass.     Tenderness: There is no abdominal tenderness. There is no guarding.     Hernia: No hernia is present.  Musculoskeletal:     Right lower leg: No edema.     Left lower leg: No edema.  Skin:    General: Skin is warm.  Neurological:     General: No focal deficit present.     Mental Status: He is alert and oriented to person, place, and time.     Cranial Nerves: Cranial nerves 2-12 are intact.     Motor: Motor function is intact.  Psychiatric:        Attention and Perception: Attention normal.        Speech: Speech normal.        Behavior: Behavior is cooperative.        Cognition and Memory: Cognition normal.      Labs on Admission: I have personally reviewed following labs and imaging studies BMET Recent Labs  Lab 04/11/22 1456 04/11/22 1831  NA 132* 137  K 6.8* 4.4  CL 93* 102  CO2 14* 11*  BUN 38* 39*  CREATININE 1.71* 1.78*  GLUCOSE 695* 357*   Electrolytes Recent Labs  Lab 04/11/22 1456 04/11/22 1526 04/11/22 1831  CALCIUM 10.0  --  9.7  MG  --  2.5*  --    Sepsis Markers Recent Labs  Lab 04/11/22 1526 04/11/22 1821 04/11/22 1828  LATICACIDVEN 2.6*  --  1.9  PROCALCITON  --  2.29  --    ABG No results for input(s): "PHART", "PCO2ART", "PO2ART" in the last 168 hours. Liver Enzymes Recent Labs  Lab 04/11/22 1456  AST 27  ALT 68*  ALKPHOS 146*  BILITOT 3.0*  ALBUMIN 4.1   Cardiac Enzymes No results for  input(s): "TROPONINI", "PROBNP" in the last 168 hours. Lab Results  Component Value Date   DDIMER 1.82 (H) 11/06/2021   Coag's Recent Labs  Lab 04/11/22 1526  APTT 28  INR 1.2    Recent Results (from the past 240 hour(s))  SARS Coronavirus 2 by RT PCR (hospital order, performed in [facility] hospital lab) *cepheid single result test* Anterior Nasal Swab     Status: None   Collection Time: 04/11/22  6:28 PM   Specimen: Anterior Nasal Swab  Result Value Ref Range Status   SARS Coronavirus 2 by RT PCR NEGATIVE NEGATIVE Final    Comment: (NOTE) SARS-CoV-2 target nucleic acids are NOT DETECTED.  The SARS-CoV-2 RNA is generally detectable in upper and lower respiratory specimens during the acute phase of infection. The lowest concentration of SARS-CoV-2 viral copies this assay can detect is 250 copies / mL. A negative result does not preclude SARS-CoV-2 infection and should not be used as the sole basis for treatment or other patient management decisions.  A negative result may occur with improper specimen collection / handling, submission of specimen other than nasopharyngeal swab, presence of viral mutation(s) within the areas targeted by this assay, and inadequate number of viral copies (<250 copies / mL). A negative result must be combined with clinical observations, patient history, and epidemiological information.  Fact Sheet for Patients:   https://www.[place].info/  Fact Sheet for Healthcare Providers: https://hall.com/  This test is not yet approved or  cleared by the [place] and has been authorized  for detection and/or diagnosis of SARS-CoV-2 by FDA under an Emergency Use Authorization (EUA).  This EUA will remain in effect (meaning this test can be used) for the duration of the COVID-19 declaration under Section 564(b)(1) of the Act, 21 U.S.C. section 360bbb-3(b)(1), unless the authorization is terminated or revoked  sooner.  Performed at [facility], [address] [place], [place] [address]      Current Facility-Administered Medications:    [START ON 04/12/2022] ceFEPIme (MAXIPIME) 2 g in sodium chloride 0.9 % 100 mL IVPB, 2 g, Intravenous, Q8H, Childs, Caroline A, RPH   dextrose 5 % in lactated ringers infusion, , Intravenous, Continuous, Lucrezia Starch, MD, Last Rate: 125 mL/hr at 04/11/22 2056, New Bag at 04/11/22 2056   dextrose 50 % solution 0-50 mL, 0-50 mL, Intravenous, PRN, Lucrezia Starch, MD   insulin regular, human (MYXREDLIN) 100 units/ 100 mL infusion, , Intravenous, Continuous, Lucrezia Starch, MD, Paused at 04/11/22 2050   lactated ringers infusion, , Intravenous, Continuous, Lucrezia Starch, MD, Stopped at 04/11/22 2050   ondansetron [facility]) injection 4 mg, 4 mg, Intravenous, Q6H PRN, Lucrezia Starch, MD   vancomycin variable dose per unstable renal function (pharmacist dosing), , Does not apply, See admin instructions, Wynelle Cleveland, [facility]  Current Outpatient Medications:    albuterol (VENTOLIN HFA) 108 (90 Base) MCG/ACT inhaler, Inhale 2 puffs into the lungs every 6 (six) hours as needed for wheezing or shortness of breath., Disp: 8 g, Rfl: 0   blood glucose meter kit and supplies KIT, Dispense based on patient and insurance preference. Use up to four times daily as directed., Disp: 1 each, Rfl: 0   Continuous Blood Gluc Receiver ([place]) [place], 1 each by Does not apply route daily., Disp: 1 each, Rfl: 0   Continuous Blood Gluc Sensor (DEXCOM G6 SENSOR) MISC, 1 each by Does not apply route daily., Disp: 3 each, Rfl: 0   FLUoxetine (PROZAC) 40 MG capsule, Take 1 capsule (40 mg total) by mouth daily., Disp: 30 capsule, Rfl: 3   gabapentin (NEURONTIN) 400 MG capsule, Take 1 capsule (400 mg total) by mouth 3 (three) times daily., Disp: 90 capsule, Rfl: 0   glucose blood test strip, Use as instructed, Disp: 100 each, Rfl: 0   HYDROcodone-acetaminophen  (NORCO/VICODIN) 5-325 MG tablet, Take 1 tablet by mouth every 4 (four) hours as needed for moderate pain., Disp: 10 tablet, Rfl: 0   insulin detemir (LEVEMIR FLEXTOUCH) 100 UNIT/ML FlexPen, Inject 15 Units into the skin 2 (two) times daily., Disp: 15 mL, Rfl: 3   insulin lispro (HUMALOG KWIKPEN) 200 UNIT/ML KwikPen, Take 3 times a day with meals.  1 unit for CBG 121-150, 2 units for 151-200, 3 units for 201-250, 5 units for 251-300, 7 units for 301-350, 9 units for 351-400., Disp: 3 mL, Rfl: 2   levothyroxine (SYNTHROID) 175 MCG tablet, Take 1 tablet (175 mcg total) by mouth daily., Disp: 30 tablet, Rfl: 2   ondansetron (ZOFRAN-ODT) 4 MG disintegrating tablet, Take 1 tablet (4 mg total) by mouth every 8 (eight) hours as needed for nausea or vomiting., Disp: 20 tablet, Rfl: 0   traMADol (ULTRAM) 50 MG tablet, Take 1 tablet (50 mg total) by mouth every 6 (six) hours as needed., Disp: 10 tablet, Rfl: 0  COVID-19 Labs No results for input(s): "DDIMER", "FERRITIN", "LDH", "CRP" in the last 72 hours. Lab Results  Component Value Date   SARSCOV2NAA NEGATIVE 04/11/2022   [place] NEGATIVE 11/30/2021  [place] NEGATIVE 11/04/2021   [place] NEGATIVE 10/25/2021    Radiological Exams on Admission: CT HEAD WO CONTRAST (5MM)  Result Date: 04/11/2022 CLINICAL DATA:  Mental status change, unknown cause EXAM: CT HEAD WITHOUT CONTRAST TECHNIQUE: Contiguous axial images were obtained from the base of the skull through the vertex without intravenous contrast. RADIATION DOSE REDUCTION: This exam was performed according to the departmental dose-optimization program which includes automated exposure control, adjustment of the mA and/or kV according to patient size and/or use of iterative reconstruction technique. COMPARISON:  None Available. FINDINGS: Brain: No acute intracranial abnormality. Specifically, no hemorrhage, hydrocephalus, mass lesion, acute infarction, or significant intracranial injury.  Vascular: No hyperdense vessel or unexpected calcification. Skull: No acute calvarial abnormality. Sinuses/Orbits: No acute findings Other: None IMPRESSION: Normal study. Electronically Signed   By: Rolm Baptise M.D.   On: 04/11/2022 17:46   CT ABDOMEN PELVIS W CONTRAST  Result Date: 04/11/2022 CLINICAL DATA:  Abdominal pain, acute, nonlocalized EXAM: CT ABDOMEN AND PELVIS WITH CONTRAST TECHNIQUE: Multidetector CT imaging of the abdomen and pelvis was performed using the standard protocol following bolus administration of intravenous contrast. RADIATION DOSE REDUCTION: This exam was performed according to the departmental dose-optimization program which includes automated exposure control, adjustment of the mA and/or kV according to patient size and/or use of iterative reconstruction technique. CONTRAST:  145m OMNIPAQUE IOHEXOL 300 MG/ML  SOLN COMPARISON:  03/21/2022 FINDINGS: Lower chest: Linear scarring in the right lung base. No effusions. No acute abnormality. Hepatobiliary: No focal hepatic abnormality. Gallbladder unremarkable. Pancreas: Calcifications in the pancreatic head and uncinate process compatible with chronic pancreatitis. No ductal dilatation or focal abnormality otherwise. Spleen: No focal abnormality.  Normal size. Adrenals/Urinary Tract: No adrenal abnormality. No focal renal abnormality. No stones or hydronephrosis. Urinary bladder is unremarkable. Stomach/Bowel: Normal appendix. Stomach, large and small bowel grossly unremarkable. Vascular/Lymphatic: No evidence of aneurysm or adenopathy. Reproductive: No visible focal abnormality. Other: No free fluid or free air. Musculoskeletal: No acute bony abnormality. IMPRESSION: No acute findings in the abdomen or pelvis. Right basilar scarring. Changes of chronic pancreatitis, stable. Electronically Signed   By: KRolm BaptiseM.D.   On: 04/11/2022 17:45   [place] LIMITED RUQ (LIVER/GB)  Result Date: 04/11/2022 CLINICAL DATA:  Abdominal pain,  acute nonlocalized. Elevated bilirubin level. EXAM: ULTRASOUND ABDOMEN LIMITED RIGHT UPPER QUADRANT COMPARISON:  Abdominopelvic CT 03/21/2022. Abdominal ultrasound 01/20/2022. FINDINGS: Gallbladder: No gallstones or wall thickening visualized. No sonographic Murphy sign noted by sonographer. Common bile duct: Diameter: 2 mm Liver: No focal lesion identified. Within normal limits in parenchymal echogenicity. Portal vein is patent on color Doppler imaging with normal direction of blood flow towards the liver. Other: None. IMPRESSION: Unchanged right upper quadrant abdominal ultrasound. No biliary dilatation. Electronically Signed   By: WRichardean SaleM.D.   On: 04/11/2022 17:29   DG Chest Port 1 View  Result Date: 04/11/2022 CLINICAL DATA:  Questionable sepsis - evaluate for abnormality EXAM: PORTABLE CHEST 1 VIEW COMPARISON:  March 20, 2022 FINDINGS: The cardiomediastinal silhouette is normal in contour. No pleural effusion. No pneumothorax. No acute pleuroparenchymal abnormality. Visualized abdomen is unremarkable. No acute osseous abnormality noted. IMPRESSION: No acute cardiopulmonary abnormality. Electronically Signed   By: SValentino SaxonM.D.   On: 04/11/2022 16:01    EKG: Independently reviewed.  Sinus tachycardia and twi in III /V1 otherwise peaked t waves.     Assessment and Plan: * Nausea & vomiting Nausea and vomiting attributed to patient's acute pancreatitis. Supportive care with IV fluids antiemetics.  IV PPI as patient has history of GERD   Abdominal pain Attribute to be acute pancreatitis with elevated lipase of 627  and and metabolic acidosis. As needed Tylenol and oral pain meds i.e. morphine. CT imaging of the abdomen shows: IMPRESSION: No acute findings in the abdomen or pelvis.  Right basilar scarring.  Changes of chronic pancreatitis, stable. Maintenance IV fluid.   Severe sepsis (HCC) Pt meets severe sepsis criteria. Infection is a possibility although not clear.   We will cont with iv abx and consult pharmacy. Follow latic.    DKA, type 1 ([place]) Patient presenting with nausea vomiting abdominal pain found to be in anion gap acidosis attributed to DKA with poorly controlled diabetes mellitus type 1. Lipid patient with hypoglycemia protocol-DKA protocol,    Latest Ref Rng & Units 04/11/2022    6:31 PM 04/11/2022    2:56 PM 03/22/2022   12:38 PM  BMP  Glucose 70 - 99 mg/dL 357  695  172   BUN 6 - 20 mg/dL 39  38  8   Creatinine 0.61 - 1.24 mg/dL 1.78  1.71  1.11   Sodium 135 - 145 mmol/L 137  132  139   Potassium 3.5 - 5.1 mmol/L 4.4  6.8  4.0   Chloride 98 - 111 mmol/L 102  93  108   CO2 22 - 32 mmol/L _0 Calcium 8.9 - 10.3 mg/dL 9.7  10.0  8.3    DKA -Patient with poor baseline control (see below) -No indication of illness as source -Otherwise no apparent reason for DKA -Moderate DKA on admission based on pH 7.129, HCO3 11, patient lethargic  -Will admit to SDU with DKA protocol -Would recommend continuing insulin drip at least until morning regardless of rapidity of closure of gap and normalization of labs -K+ slightly increased at time of presentation and we will treat with insulin.. IVF at 150 cc/hr, LR until glucose <250 and then decrease rate to 125 and change to D5LR   High anion gap metabolic acidosis Due to combination of DKA, acute kidney injury, lactic acidosis. Goal will be to treat underlying causes and originally with IV fluids, IV antibiotics, IV insulin regimen.  Hyperkalemia    Latest Ref Rng & Units 04/11/2022    6:31 PM 04/11/2022    2:56 PM 03/22/2022   12:38 PM  BMP  Glucose 70 - 99 mg/dL 357  695  172   BUN 6 - 20 mg/dL 39  38  8   Creatinine 0.61 - 1.24 mg/dL 1.78  1.71  1.11   Sodium 135 - 145 mmol/L 137  132  139   Potassium 3.5 - 5.1 mmol/L 4.4  6.8  4.0   Chloride 98 - 111 mmol/L 102  93  108   CO2 22 - 32 mmol/L _1 Calcium 8.9 - 10.3 mg/dL 9.7  10.0  8.3   resolved with ivf and insulin  . We will follow.    AKI (acute kidney injury) ([place]) Lab Results  Component Value Date   CREATININE 1.78 (H) 04/11/2022   CREATININE 1.71 (H) 04/11/2022   CREATININE 1.11 03/22/2022  Again attribute to dehydration, DKA. We will avoid contrast studies. We will renally dose all needed medications.    Essential hypertension Blood pressure 131/61, pulse (!) 121, temperature 97.7 F (36.5 C), temperature source Oral, resp. rate (!) 27, height _2  (1.676 m), weight 70.8 kg, SpO2 99 %.  Vitals:   04/11/22 1451 04/11/22 1500 04/11/22 1530 04/11/22 1600  BP: 124/68 125/62 139/80 (!) 141/76   04/11/22 1630 04/11/22 1700 04/11/22 1745 04/11/22 1800  BP: 138/73 139/74 124/68 (!) 141/78   04/11/22 1925 04/11/22 1930 04/11/22 2045  BP: 135/71 135/77 131/61  cont with ivf hydration.  No meds in chart.    Abnormal LFTs Hepatic Function Panel     Component Value Date/Time   PROT 7.9 04/11/2022 1456   ALBUMIN 4.1 04/11/2022 1456   AST 27 04/11/2022 1456   ALT 68 (H) 04/11/2022 1456   ALKPHOS 146 (H) 04/11/2022 1456   BILITOT 3.0 (H) 04/11/2022 1456   BILIDIR 0.2 03/20/2022 1555   IBILI 2.5 (H) 03/20/2022 1555  we will follow attribute to sepsis. Cont with ivf hydration and monitor.    Acute pancreatitis Elevated lipase 230, n.p.o., IV fluids, .  Pain medications.  Hypothyroidism Cont levothyroxine at 175 mcg./    Anxiety and depression Cont with fluoxetine at 20 mg.,    DVT prophylaxis:  Heparin    Code Status:  Full code     Family Communication:   Bear, Osten (Spouse)  [phone] (Mobile)     Disposition Plan:  Home    Consults called:  None   Admission status: Inpatient.      Para Skeans MD [facility]  6 PM- 2 AM. Please contact me via secure Chat 6 PM-2 AM. [phone] ( Pager ) To contact the [facility] Attending or Consulting provider [place] or covering provider during after hours [place], for this patient.   Check the care team in  [facility] and look for a) attending/consulting [facility] provider listed and b) the [facility] team listed Log into [email or web address] and use [place] universal password to access. If you do not have the password, please contact the hospital operator. Locate the [facility] provider you are looking for under [facility] and page to a number that you can be directly reached. If you still have difficulty reaching the provider, please page the [facility] (Director on Call) for the Hospitalists listed on amion for assistance. [email or web address] 04/11/2022, 9:01 PM

## 2022-04-11 NOTE — Assessment & Plan Note (Addendum)
Lab Results  Component Value Date   CREATININE 1.78 (H) 04/11/2022   CREATININE 1.71 (H) 04/11/2022   CREATININE 1.11 03/22/2022  Again attribute to dehydration, DKA. We will avoid contrast studies. We will renally dose all needed medications.  Monitor kidney function

## 2022-04-11 NOTE — Progress Notes (Signed)
PHARMACY -  BRIEF ANTIBIOTIC NOTE   Pharmacy has received consults for vancomycin and cefepime from an ED provider.  The patient's profile has been reviewed for ht/wt/allergies/indication/available labs.    One time orders placed for vancomycin 1500 mg x 1 and cefepime 2 grams x 1  Further antibiotics/pharmacy consults should be ordered by admitting physician if indicated.                       Thank you, Elliot Gurney, PharmD Clinical Pharmacist  04/11/2022 5:24 PM

## 2022-04-11 NOTE — ED Notes (Signed)
Report called to ICU Charge RN.

## 2022-04-11 NOTE — Assessment & Plan Note (Addendum)
Due to combination of DKA, acute kidney injury, lactic acidosis. Now resolved with treatment of DKA

## 2022-04-11 NOTE — Assessment & Plan Note (Signed)
Pt meets severe sepsis criteria. Infection is a possibility although not clear.  We will cont with iv abx and consult pharmacy. Follow latic.

## 2022-04-11 NOTE — Assessment & Plan Note (Addendum)
Could be from acute on chronic pancreatitis although his CT abdomen shows no acute pancreatitis.  His lipase is elevated we will recheck it today.  Start him on clear liquid diet and monitor his pain.  Check urine drug screen

## 2022-04-11 NOTE — Assessment & Plan Note (Addendum)
Patient presenting with nausea vomiting abdominal pain found to be in anion gap acidosis attributed to DKA with poorly controlled diabetes mellitus type 1. 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  He also reports that last time there was some medication confusion about where his prescriptions were sent.  And he could not get his insulin. Treated with insulin drip overnight but as anion gap closed with blood sugar improving now, Transitioned him off insulin drip to subcu. Hemoglobin A1c 10 suggestive of poor control We will consult diabetic nurse

## 2022-04-11 NOTE — Assessment & Plan Note (Addendum)
Cont with fluoxetine at 20 mg

## 2022-04-11 NOTE — Assessment & Plan Note (Addendum)
Nausea and vomiting attributed to DKA and possibly acute on chronic pancreatitis. Continue supportive care.  Start clear liquid diet, check UDS

## 2022-04-11 NOTE — ED Provider Notes (Signed)
[facility] Provider Note    Event Date/Time   First MD Initiated Contact with Patient 04/11/22 1454     (approximate)   History   Hyperglycemia   HPI  David Ramsey is a 26 y.o. male with a past medical history of Hashimoto's disease, DKA insulin-dependent type 1 having ran out of insulin 3 days ago who presents for evaluation of several days of nausea and vomiting increased fatigue.  Patient endorses a cough abdominal pain.  Is reportedly only responsive to painful stimuli with EMS although on my assessment he is awake but sleepy and arousable.  He is denying any illicit drug use, EtOH use states he ran out of insulin 3 days ago.  He denies any fevers, chest pain, shortness of breath or falls or injuries or headache.    Past Medical History:  Diagnosis Date   AKI (acute kidney injury) ([place]) 12/16/2016   Diabetes mellitus without complication ([place])    INSULIN DEPENDENT   SINCE AGE 18   DKA (diabetic ketoacidoses) 02/17/2017   Hashimoto's disease    MRSA infection    Pneumonia    Liver contusion, lung effusion, sepsis multiorgan failure, no clear source     Physical Exam  Triage Vital Signs: ED Triage Vitals  Enc Vitals Group     BP 04/11/22 1451 124/68     Pulse Rate 04/11/22 1454 (!) 112     Resp 04/11/22 1451 (!) 25     Temp 04/11/22 1454 (!) 97.2 F (36.2 C)     Temp Source 04/11/22 1454 Axillary     SpO2 04/11/22 1454 99 %     Weight --      Height --      Head Circumference --      Peak Flow --      Pain Score --      Pain Loc --      Pain Edu? --      Excl. in [place]? --     Most recent vital signs: Vitals:   04/11/22 1454 04/11/22 1454  BP:    Pulse: (!) 112   Resp: (!) 32   Temp:  (!) 97.2 F (36.2 C)  SpO2: 99%     General: Awake, ill-appearing.  Very sleepy but arousable. CV:  Plus radial pulses.  Tachycardic.  Decreased capillary refill.  No significant murmur. Resp:  Normal effort.  Clear bilaterally.   Tachypneic Abd:  No distention.  Tender throughout Other:  Patient is able to follow commands all extremities.  PERRLA.  EOMI.   ED Results / Procedures / Treatments  Labs (all labs ordered are listed, but only abnormal results are displayed) Labs Reviewed  CBC - Abnormal; Notable for the following components:      Result Value   WBC 18.9 (*)    Platelets 533 (*)    All other components within normal limits  COMPREHENSIVE METABOLIC PANEL - Abnormal; Notable for the following components:   Sodium 132 (*)    Potassium 6.8 (*)    Chloride 93 (*)    CO2 14 (*)    Glucose, Bld 695 (*)    BUN 38 (*)    Creatinine, Ser 1.71 (*)    ALT 68 (*)    Alkaline Phosphatase 146 (*)    Total Bilirubin 3.0 (*)    GFR, Estimated 57 (*)    Anion gap 25 (*)    All other components within normal limits  CBG MONITORING,  ED - Abnormal; Notable for the following components:   Glucose-Capillary >600 (*)    All other components within normal limits  CULTURE, BLOOD (ROUTINE X 2)  CULTURE, BLOOD (ROUTINE X 2)  SARS CORONAVIRUS 2 BY RT PCR  MRSA NEXT GEN BY PCR, NASAL  URINALYSIS, ROUTINE W REFLEX MICROSCOPIC  BETA-HYDROXYBUTYRIC ACID  BLOOD GAS, VENOUS  LACTIC ACID, PLASMA  LACTIC ACID, PLASMA  PROTIME-INR  APTT  URINALYSIS, COMPLETE (UACMP) WITH MICROSCOPIC  MAGNESIUM  LIPASE, BLOOD  TSH  ETHANOL  URINE DRUG SCREEN, QUALITATIVE ([facility] ONLY)  BASIC METABOLIC PANEL  BASIC METABOLIC PANEL  BASIC METABOLIC PANEL  BASIC METABOLIC PANEL  BETA-HYDROXYBUTYRIC ACID  BETA-HYDROXYBUTYRIC ACID  CBG MONITORING, ED  CBG MONITORING, ED     EKG  EKG is remarkable sinus tachycardia with ventricular to 114, 1 right axis deviation, unremarkable intervals with what appeared to be peaked T waves in V2 and V3 as well as a nonspecific ST change in lead III without other evidence of acute ischemia or significant arrhythmia.   RADIOLOGY  CT head on my interpretation without evidence of hemorrhage,  ischemia, edema, affect or other acute intracranial process to explain patient's sleepiness nausea and vomiting.  I also reviewed radiology interpretation  Chest reviewed by myself shows no focal consoidation, effusion, edema, pneumothorax or other clear acute thoracic process. I also reviewed radiology interpretation and agree with findings described.  Right upper quadrant ultrasound my interpretation without evidence of gallstones, cholecystitis or dilation of the common bile duct.  I also reviewed radiology interpretation.  CT abdomen pelvis my interpretation without evidence of cholecystitis, diverticulitis, appendicitis and is some scarring of the pancreas.  I reviewed radiology interpretation and agree their findings of right basilar scarring and changes chronic pancreatitis but no other acute process.   PROCEDURES:  Critical Care performed: Yes, see critical care procedure note(s)  .Critical Care  Performed by: Lucrezia Starch, MD Authorized by: Lucrezia Starch, MD   Critical care provider statement:    Critical care time (minutes):  30   Critical care was necessary to treat or prevent imminent or life-threatening deterioration of the following conditions:  Dehydration and endocrine crisis   Critical care was time spent personally by me on the following activities:  Development of treatment plan with patient or surrogate, discussions with consultants, evaluation of patient's response to treatment, examination of patient, ordering and review of laboratory studies, ordering and review of radiographic studies, ordering and performing treatments and interventions, pulse oximetry, re-evaluation of patient's condition and review of old [place] ED: Medications  calcium gluconate 1 g/ 50 mL sodium chloride IVPB (1,000 mg Intravenous New Bag/Given 04/11/22 1550)  insulin regular, human (MYXREDLIN) 100 units/ 100 mL infusion (10.5 Units/hr Intravenous New Bag/Given  04/11/22 1553)  lactated ringers infusion (has no administration in time range)  dextrose 5 % in lactated ringers infusion (has no administration in time range)  dextrose 50 % solution 0-50 mL (has no administration in time range)  sodium chloride 0.9 % bolus 1,000 mL (1,000 mLs Intravenous New Bag/Given 04/11/22 1459)  lactated ringers bolus 1,000 mL (1,000 mLs Intravenous New Bag/Given 04/11/22 1558)  sodium bicarbonate injection 50 mEq (50 mEq Intravenous Given 04/11/22 1551)     IMPRESSION / MDM / [place] / ED COURSE  I reviewed the triage vital signs and the nursing notes. Patient's presentation is most consistent with acute presentation with potential threat to life or bodily function.  Differential diagnosis includes, but is not limited to DKA, sepsis, HHS, pancreatitis  EKG is remarkable sinus tachycardia with ventricular to 114, 1 right axis deviation, unremarkable intervals with what appeared to be peaked T waves in V2 and V3 as well as a nonspecific ST change in lead III without other evidence of acute ischemia or significant arrhythmia.  CT head on my interpretation without evidence of hemorrhage, ischemia, edema, affect or other acute intracranial process to explain patient's sleepiness nausea and vomiting.  I also reviewed radiology interpretation  Chest reviewed by myself shows no focal consoidation, effusion, edema, pneumothorax or other clear acute thoracic process. I also reviewed radiology interpretation and agree with findings described.  Right upper quadrant ultrasound my interpretation without evidence of gallstones, cholecystitis or dilation of the common bile duct.  I also reviewed radiology interpretation.  CT abdomen pelvis my interpretation without evidence of cholecystitis, diverticulitis, appendicitis and is some scarring of the pancreas.  I reviewed radiology interpretation and agree their findings of right basilar scarring and  changes chronic pancreatitis but no other acute process.  CMP is remarkable for sodium of 132 that corrects to 146 taking consideration a glucose of 695, K of 6.8, bicarb of 14, BUN of 38 and a creatinine of 1.71 compared to 1.12 weeks ago with an ALT of 68, alk phos of 146 and T. bili of 3 with an anion gap of 25.  CBC with WC count of 18.9 with normal hemoglobin and platelets of 533.  VBG with a pH of 7.07 with PCO2 less than 18 and bicarb of 4.3.  Lactic acid 2.6.  PTT and INR unremarkable.  Lipase 230 consistent with pancreatitis.  Serum ethanol undetectable.  Magnesium 2.5.  TSH WNL.  Patient started on DKA insulin protocol.  He was given some calcium and bicarb for his hyperkalemia as well.  Given lactic acidosis leukocytosis and multiple SIRS criteria he was also given empiric dose of antibiotics for possible sepsis and blood cultures were obtained.  I will admit to medicine service for further evaluation and management.      FINAL CLINICAL IMPRESSION(S) / ED DIAGNOSES   Final diagnoses:  Diabetic ketoacidosis without coma associated with type 1 diabetes mellitus (HCC)  Hyperkalemia  AKI (acute kidney injury) ([place])     Rx / DC Orders   ED Discharge Orders     None        Note:  This document was prepared using Dragon voice recognition software and may include unintentional dictation errors.   Lucrezia Starch, MD 04/11/22 [phone]

## 2022-04-11 NOTE — ED Notes (Signed)
Pt transported to CT ?

## 2022-04-11 NOTE — Sepsis Progress Note (Signed)
Elink following code sepsis °

## 2022-04-12 DIAGNOSIS — R7989 Other specified abnormal findings of blood chemistry: Secondary | ICD-10-CM | POA: Diagnosis not present

## 2022-04-12 DIAGNOSIS — N179 Acute kidney failure, unspecified: Secondary | ICD-10-CM | POA: Diagnosis not present

## 2022-04-12 DIAGNOSIS — K859 Acute pancreatitis without necrosis or infection, unspecified: Secondary | ICD-10-CM

## 2022-04-12 DIAGNOSIS — R1114 Bilious vomiting: Secondary | ICD-10-CM | POA: Diagnosis not present

## 2022-04-12 DIAGNOSIS — K861 Other chronic pancreatitis: Secondary | ICD-10-CM

## 2022-04-12 LAB — URINE DRUG SCREEN, QUALITATIVE (ARMC ONLY)
Amphetamines, Ur Screen: NOT DETECTED
Barbiturates, Ur Screen: NOT DETECTED
Benzodiazepine, Ur Scrn: NOT DETECTED
Cannabinoid 50 Ng, Ur ~~LOC~~: NOT DETECTED
Cocaine Metabolite,Ur ~~LOC~~: NOT DETECTED
MDMA (Ecstasy)Ur Screen: NOT DETECTED
Methadone Scn, Ur: NOT DETECTED
Opiate, Ur Screen: POSITIVE — AB
Phencyclidine (PCP) Ur S: NOT DETECTED
Tricyclic, Ur Screen: NOT DETECTED

## 2022-04-12 LAB — GLUCOSE, CAPILLARY
Glucose-Capillary: 151 mg/dL — ABNORMAL HIGH (ref 70–99)
Glucose-Capillary: 167 mg/dL — ABNORMAL HIGH (ref 70–99)
Glucose-Capillary: 170 mg/dL — ABNORMAL HIGH (ref 70–99)
Glucose-Capillary: 177 mg/dL — ABNORMAL HIGH (ref 70–99)
Glucose-Capillary: 179 mg/dL — ABNORMAL HIGH (ref 70–99)
Glucose-Capillary: 181 mg/dL — ABNORMAL HIGH (ref 70–99)
Glucose-Capillary: 182 mg/dL — ABNORMAL HIGH (ref 70–99)
Glucose-Capillary: 191 mg/dL — ABNORMAL HIGH (ref 70–99)
Glucose-Capillary: 195 mg/dL — ABNORMAL HIGH (ref 70–99)
Glucose-Capillary: 200 mg/dL — ABNORMAL HIGH (ref 70–99)
Glucose-Capillary: 224 mg/dL — ABNORMAL HIGH (ref 70–99)
Glucose-Capillary: 227 mg/dL — ABNORMAL HIGH (ref 70–99)

## 2022-04-12 LAB — BASIC METABOLIC PANEL
Anion gap: 13 (ref 5–15)
Anion gap: 9 (ref 5–15)
BUN: 24 mg/dL — ABNORMAL HIGH (ref 6–20)
BUN: 19 mg/dL (ref 6–20)
CO2: 18 mmol/L — ABNORMAL LOW (ref 22–32)
CO2: 21 mmol/L — ABNORMAL LOW (ref 22–32)
Calcium: 9.3 mg/dL (ref 8.9–10.3)
Calcium: 9.3 mg/dL (ref 8.9–10.3)
Chloride: 111 mmol/L (ref 98–111)
Chloride: 111 mmol/L (ref 98–111)
Creatinine, Ser: 1.26 mg/dL — ABNORMAL HIGH (ref 0.61–1.24)
Creatinine, Ser: 1.23 mg/dL (ref 0.61–1.24)
GFR, Estimated: >60 mL/min (ref >60)
GFR, Estimated: >60 mL/min (ref >60)
Glucose, Bld: 175 mg/dL — ABNORMAL HIGH (ref 70–99)
Glucose, Bld: 175 mg/dL — ABNORMAL HIGH (ref 70–99)
Potassium: 4.3 mmol/L (ref 3.5–5.1)
Potassium: 3.9 mmol/L (ref 3.5–5.1)
Sodium: 142 mmol/L (ref 135–145)
Sodium: 141 mmol/L (ref 135–145)

## 2022-04-12 LAB — LIPASE, BLOOD: Lipase: 852 U/L — ABNORMAL HIGH (ref 11–51)

## 2022-04-12 LAB — BETA-HYDROXYBUTYRIC ACID
Beta-Hydroxybutyric Acid: 5.23 mmol/L — ABNORMAL HIGH (ref 0.05–0.27)
Beta-Hydroxybutyric Acid: 2.33 mmol/L — ABNORMAL HIGH (ref 0.05–0.27)

## 2022-04-12 LAB — AMYLASE: Amylase: 1120 U/L — ABNORMAL HIGH (ref 28–100)

## 2022-04-12 LAB — PROCALCITONIN: Procalcitonin: 7.86 ng/mL

## 2022-04-12 MED ORDER — POTASSIUM CHLORIDE 10 MEQ/100ML IV SOLN
10.0000 meq | INTRAVENOUS | Status: AC
  Administered 2022-04-12 (×2): 10 meq via INTRAVENOUS
  Filled 2022-04-12 (×2): qty 100

## 2022-04-12 MED ORDER — CHLORHEXIDINE GLUCONATE CLOTH 2 % EX PADS
6.0000 | MEDICATED_PAD | Freq: Every day | CUTANEOUS | Status: DC
  Administered 2022-04-13: 6 via TOPICAL

## 2022-04-12 MED ORDER — INSULIN ASPART 100 UNIT/ML IJ SOLN
2.0000 [IU] | INTRAMUSCULAR | Status: DC
  Administered 2022-04-12 (×2): 4 [IU] via SUBCUTANEOUS
  Administered 2022-04-12 – 2022-04-13 (×3): 6 [IU] via SUBCUTANEOUS
  Filled 2022-04-12 (×5): qty 1

## 2022-04-12 MED ORDER — MORPHINE SULFATE (PF) 4 MG/ML IV SOLN
4.0000 mg | INTRAVENOUS | Status: DC | PRN
  Administered 2022-04-12 (×2): 4 mg via INTRAVENOUS
  Filled 2022-04-12 (×2): qty 1

## 2022-04-12 MED ORDER — INSULIN DETEMIR 100 UNIT/ML ~~LOC~~ SOLN
18.0000 [IU] | Freq: Two times a day (BID) | SUBCUTANEOUS | Status: DC
  Administered 2022-04-12 – 2022-04-13 (×3): 18 [IU] via SUBCUTANEOUS
  Filled 2022-04-12 (×4): qty 0.18

## 2022-04-12 MED ORDER — MORPHINE SULFATE (PF) 2 MG/ML IV SOLN
2.0000 mg | INTRAVENOUS | Status: DC | PRN
  Administered 2022-04-12 – 2022-04-13 (×4): 2 mg via INTRAVENOUS
  Filled 2022-04-12 (×4): qty 1

## 2022-04-12 MED ORDER — DIPHENHYDRAMINE HCL 50 MG/ML IJ SOLN
12.5000 mg | Freq: Four times a day (QID) | INTRAMUSCULAR | Status: AC | PRN
  Administered 2022-04-12 – 2022-04-13 (×2): 12.5 mg via INTRAVENOUS
  Filled 2022-04-12 (×2): qty 1

## 2022-04-12 MED ORDER — HEPARIN SODIUM (PORCINE) 5000 UNIT/ML IJ SOLN
5000.0000 [IU] | Freq: Three times a day (TID) | INTRAMUSCULAR | Status: DC
  Administered 2022-04-12 – 2022-04-14 (×7): 5000 [IU] via SUBCUTANEOUS
  Filled 2022-04-12 (×7): qty 1

## 2022-04-12 MED ORDER — SODIUM CHLORIDE 0.9 % IV SOLN: INTRAVENOUS | Status: DC | PRN

## 2022-04-12 MED ORDER — VANCOMYCIN HCL IN DEXTROSE 1-5 GM/200ML-% IV SOLN
1000.0000 mg | Freq: Two times a day (BID) | INTRAVENOUS | Status: DC
  Administered 2022-04-12 – 2022-04-13 (×2): 1000 mg via INTRAVENOUS
  Filled 2022-04-12 (×3): qty 200

## 2022-04-12 NOTE — Hospital Course (Addendum)
26 year old male admitted for DKA  8/6: Transitioned off insulin drip.  Transfer to MedSurg 8/7: Episode of hypoglycemia this morning.  GI consult as he is amylase and lipase are going up

## 2022-04-12 NOTE — Progress Notes (Signed)
Pharmacy Antibiotic Note  David Ramsey is a 26 y.o. male admitted on 04/11/2022 with sepsis.  Pharmacy has been consulted for vancomycin and cefepime dosing.  Plan: Pt received vancomycin 1500 mg x 1 on 8/5. Will order a maintenance dose of vancomycin 1000 mg q12H. Predicted AUC of 531. Goal AUC of 400-550. Scr 1.23, Vd 0.72. IBW. Plan to check vancomycin level after 4th or 5th dose.   Cefepime 2 grams every 8 hours    Height: 5\' 6"  (167.6 cm) Weight: 70.8 kg (156 lb 1.4 oz) IBW/kg (Calculated) : 63.8  Temp (24hrs), Avg:98 F (36.7 C), Min:97.2 F (36.2 C), Max:98.4 F (36.9 C)  Recent Labs  Lab 04/11/22 1456 04/11/22 1526 04/11/22 1828 04/11/22 1831 04/11/22 2315 04/12/22 0335 04/12/22 0724  WBC 18.9*  --   --   --   --   --   --   CREATININE 1.71*  --   --  1.78* 1.36* 1.26* 1.23  LATICACIDVEN  --  2.6* 1.9  --   --   --   --      Estimated Creatinine Clearance: 83.6 mL/min (by C-G formula based on SCr of 1.23 mg/dL).    Allergies  Allergen Reactions   Tape Rash    Blisters- can use kerlix tape    Antimicrobials this admission: Vancomycin 8/5 >>  Cefepime 8/5 >>   Dose adjustments this admission: N/a  Microbiology results: 8/5 BCx: in process  Thank you for allowing pharmacy to be a part of this patient's care.  10/5, PharmD Clinical Pharmacist  04/12/2022 10:34 AM

## 2022-04-12 NOTE — Progress Notes (Signed)
Report called to Bet, receiving nurse on 1A.

## 2022-04-12 NOTE — Progress Notes (Signed)
Progress Note   Patient: David Ramsey ZHG:[id number] DOB: [birth year] DOA: 04/11/2022     1 DOS: the patient was seen and examined on 04/12/2022   Brief hospital course: 26 year old male admitted for DKA  8/6: Transitioned off insulin drip.  Transfer to MedSurg   Assessment and Plan: * Nausea & vomiting Nausea and vomiting attributed to DKA and possibly acute on chronic pancreatitis. Continue supportive care.  Start clear liquid diet, check UDS   Abdominal pain Could be from acute on chronic pancreatitis although his CT abdomen shows no acute pancreatitis.  His lipase is elevated we will recheck it today.  Start him on clear liquid diet and monitor his pain.  Check urine drug screen  DKA, type 1 (HCC) Patient presenting with nausea vomiting abdominal pain found to be in anion gap acidosis attributed to DKA with poorly controlled diabetes mellitus type 1. 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  He also reports that last time there was some medication confusion about where his prescriptions were sent.  And he could not get his insulin. Treated with insulin drip overnight but as anion gap closed with blood sugar improving now, Transitioned him off insulin drip to subcu. Hemoglobin A1c 10 suggestive of poor control We will consult diabetic nurse  High anion gap metabolic acidosis Due to combination of DKA, acute kidney injury, lactic acidosis. Now resolved with treatment of DKA  Hyperkalemia    Latest Ref Rng & Units 04/11/2022    6:31 PM 04/11/2022    2:56 PM 03/22/2022   12:38 PM  BMP  Glucose 70 - 99 mg/dL 196  222  979   BUN 6 - 20 mg/dL 39  38  8   Creatinine 0.61 - 1.24 mg/dL 8.92  1.19  4.17   Sodium 135 - 145 mmol/L 137  132  139   Potassium 3.5 - 5.1 mmol/L 4.4  6.8  4.0   Chloride 98 - 111 mmol/L 102  93  108   CO2 22 - 32 mmol/L 11  14   28    Calcium 8.9 - 10.3 mg/dL 9.7   8.3   resolved with ivf and insulin . Likely due to imbalance from DKA   AKI (acute kidney injury) [facility]) Lab Results  Component Value Date   CREATININE 1.78 (H) 04/11/2022   CREATININE 1.71 (H) 04/11/2022   CREATININE 1.11 03/22/2022  Again attribute to dehydration, DKA. We will avoid contrast studies. We will renally dose all needed medications.  Monitor kidney function    Essential hypertension Blood pressure 131/61, pulse (!) 121, temperature 97.7 F (36.5 C), temperature source Oral, resp. rate (!) 27, height 5\' 6"  (1.676 m), weight 70.8 kg, SpO2 99 %.  Blood pressure is much better controlled  Abnormal LFTs Monitor liver function test    Component Value Date/Time   PROT 7.9 04/11/2022 1456   ALBUMIN 4.1 04/11/2022 1456   AST 27 04/11/2022 1456   ALT 68 (H) 04/11/2022 1456   ALKPHOS 146 (H) 04/11/2022 1456   BILITOT 3.0 (H) 04/11/2022 1456   BILIDIR 0.2 03/20/2022 1555   IBILI 2.5 (H) 03/20/2022 1555  Sepsis ruled out   Acute on chronic pancreatitis (HCC) Elevated lipase 230, recheck lipase, check amylase.  Start clear liquid diet provide symptomatic management  Hypothyroidism Cont levothyroxine at 175 mcg.  Normal  TSH   Anxiety and depression Cont with fluoxetine at 20 mg        Subjective: Denies any vomiting.  He does feel somewhat nauseous.  He shares about not able to get his insulin for last 3 days and interested in getting some help for his medications/financial  Physical Exam: Vitals:   04/12/22 0400 04/12/22 0819 04/12/22 0900 04/12/22 1314  BP: 123/64 131/70  130/86  Pulse: (!) 120 (!) 112  (!) 104  Resp: (!) 22 (!) 24  18  Temp: 98.4 F (36.9 C)  98.2 F (36.8 C)   TempSrc: Oral  Oral   SpO2: 98% 99%  98%  Weight:      Height:       26 year old male lying in the bed comfortably without any acute distress Lungs clear to auscultation bilaterally Cardiovascular regular rate and rhythm Abdomen  soft, benign Neuro alert and oriented, nonfocal Psych normal mood and affect Data Reviewed:  Anion gap closed.  Last beta-hydroxybutyrate acid was 2.3  Family Communication: None  Disposition: Status is: Inpatient Remains inpatient appropriate because: Management of DKA.  Patient is still tachycardic and tachypneic.   Planned Discharge Destination: Home    DVT prophylaxis-heparin Time spent: 35 minutes  Author: Delfino Lovett, MD 04/12/2022 1:28 PM  For on call review [email or web address].

## 2022-04-13 ENCOUNTER — Other Ambulatory Visit (HOSPITAL_COMMUNITY): Payer: Self-pay

## 2022-04-13 ENCOUNTER — Inpatient Hospital Stay: Payer: BC Managed Care – PPO

## 2022-04-13 ENCOUNTER — Telehealth: Payer: Self-pay

## 2022-04-13 DIAGNOSIS — I10 Essential (primary) hypertension: Secondary | ICD-10-CM

## 2022-04-13 DIAGNOSIS — R1114 Bilious vomiting: Secondary | ICD-10-CM | POA: Diagnosis not present

## 2022-04-13 DIAGNOSIS — E1010 Type 1 diabetes mellitus with ketoacidosis without coma: Secondary | ICD-10-CM | POA: Diagnosis not present

## 2022-04-13 DIAGNOSIS — R1084 Generalized abdominal pain: Secondary | ICD-10-CM

## 2022-04-13 DIAGNOSIS — N179 Acute kidney failure, unspecified: Secondary | ICD-10-CM | POA: Diagnosis not present

## 2022-04-13 DIAGNOSIS — F32A Depression, unspecified: Secondary | ICD-10-CM

## 2022-04-13 DIAGNOSIS — F419 Anxiety disorder, unspecified: Secondary | ICD-10-CM

## 2022-04-13 LAB — GLUCOSE, CAPILLARY
Glucose-Capillary: 113 mg/dL — ABNORMAL HIGH (ref 70–99)
Glucose-Capillary: 155 mg/dL — ABNORMAL HIGH (ref 70–99)
Glucose-Capillary: 157 mg/dL — ABNORMAL HIGH (ref 70–99)
Glucose-Capillary: 168 mg/dL — ABNORMAL HIGH (ref 70–99)
Glucose-Capillary: 209 mg/dL — ABNORMAL HIGH (ref 70–99)
Glucose-Capillary: 236 mg/dL — ABNORMAL HIGH (ref 70–99)
Glucose-Capillary: 260 mg/dL — ABNORMAL HIGH (ref 70–99)
Glucose-Capillary: 48 mg/dL — ABNORMAL LOW (ref 70–99)
Glucose-Capillary: 51 mg/dL — ABNORMAL LOW (ref 70–99)
Glucose-Capillary: 52 mg/dL — ABNORMAL LOW (ref 70–99)
Glucose-Capillary: 58 mg/dL — ABNORMAL LOW (ref 70–99)
Glucose-Capillary: 89 mg/dL (ref 70–99)

## 2022-04-13 LAB — CBC
HCT: 34 % — ABNORMAL LOW (ref 39.0–52.0)
Hemoglobin: 11.5 g/dL — ABNORMAL LOW (ref 13.0–17.0)
MCH: 30.6 pg (ref 26.0–34.0)
MCHC: 33.8 g/dL (ref 30.0–36.0)
MCV: 90.4 fL (ref 80.0–100.0)
Platelets: 355 10*3/uL (ref 150–400)
RBC: 3.76 MIL/uL — ABNORMAL LOW (ref 4.22–5.81)
RDW: 12.3 % (ref 11.5–15.5)
WBC: 8.5 10*3/uL (ref 4.0–10.5)
nRBC: 0 % (ref 0.0–0.2)

## 2022-04-13 LAB — COMPREHENSIVE METABOLIC PANEL
ALT: 37 U/L (ref 0–44)
AST: 20 U/L (ref 15–41)
Albumin: 2.9 g/dL — ABNORMAL LOW (ref 3.5–5.0)
Alkaline Phosphatase: 85 U/L (ref 38–126)
Anion gap: 7 (ref 5–15)
BUN: 8 mg/dL (ref 6–20)
CO2: 25 mmol/L (ref 22–32)
Calcium: 8.9 mg/dL (ref 8.9–10.3)
Chloride: 113 mmol/L — ABNORMAL HIGH (ref 98–111)
Creatinine, Ser: 0.85 mg/dL (ref 0.61–1.24)
GFR, Estimated: >60 mL/min (ref >60)
Glucose, Bld: 103 mg/dL — ABNORMAL HIGH (ref 70–99)
Potassium: 3.2 mmol/L — ABNORMAL LOW (ref 3.5–5.1)
Sodium: 145 mmol/L (ref 135–145)
Total Bilirubin: 0.9 mg/dL (ref 0.3–1.2)
Total Protein: 5.8 g/dL — ABNORMAL LOW (ref 6.5–8.1)

## 2022-04-13 LAB — PROCALCITONIN: Procalcitonin: 5.08 ng/mL

## 2022-04-13 LAB — MAGNESIUM: Magnesium: 2.1 mg/dL (ref 1.7–2.4)

## 2022-04-13 MED ORDER — PREGABALIN 75 MG PO CAPS
75.0000 mg | ORAL_CAPSULE | Freq: Every day | ORAL | Status: DC
  Administered 2022-04-13 – 2022-04-14 (×2): 75 mg via ORAL
  Filled 2022-04-13 (×2): qty 1

## 2022-04-13 MED ORDER — POTASSIUM CHLORIDE CRYS ER 20 MEQ PO TBCR
40.0000 meq | EXTENDED_RELEASE_TABLET | Freq: Once | ORAL | Status: AC
  Administered 2022-04-13: 40 meq via ORAL
  Filled 2022-04-13: qty 2

## 2022-04-13 MED ORDER — GABAPENTIN 400 MG PO CAPS: 400.0000 mg | ORAL_CAPSULE | Freq: Three times a day (TID) | ORAL | Status: DC

## 2022-04-13 MED ORDER — PANTOPRAZOLE SODIUM 40 MG PO TBEC
40.0000 mg | DELAYED_RELEASE_TABLET | Freq: Every day | ORAL | Status: DC
  Administered 2022-04-13 – 2022-04-14 (×2): 40 mg via ORAL
  Filled 2022-04-13 (×2): qty 1

## 2022-04-13 MED ORDER — GADOBUTROL 1 MMOL/ML IV SOLN
7.0000 mL | Freq: Once | INTRAVENOUS | Status: AC | PRN
  Administered 2022-04-13: 7 mL via INTRAVENOUS

## 2022-04-13 MED ORDER — INSULIN DETEMIR 100 UNIT/ML ~~LOC~~ SOLN
14.0000 [IU] | Freq: Two times a day (BID) | SUBCUTANEOUS | Status: DC
  Administered 2022-04-13 – 2022-04-14 (×2): 14 [IU] via SUBCUTANEOUS
  Filled 2022-04-13 (×3): qty 0.14

## 2022-04-13 MED ORDER — MORPHINE SULFATE (PF) 2 MG/ML IV SOLN
2.0000 mg | INTRAVENOUS | Status: DC | PRN
  Administered 2022-04-13 – 2022-04-14 (×4): 2 mg via INTRAVENOUS
  Filled 2022-04-13 (×4): qty 1

## 2022-04-13 MED ORDER — INSULIN ASPART 100 UNIT/ML IJ SOLN
0.0000 [IU] | INTRAMUSCULAR | Status: DC
  Administered 2022-04-13: 1 [IU] via SUBCUTANEOUS
  Administered 2022-04-14: 3 [IU] via SUBCUTANEOUS
  Administered 2022-04-14: 2 [IU] via SUBCUTANEOUS
  Administered 2022-04-14: 6 [IU] via SUBCUTANEOUS
  Filled 2022-04-13 (×4): qty 1

## 2022-04-13 MED ORDER — FLUOXETINE HCL 20 MG PO CAPS
40.0000 mg | ORAL_CAPSULE | Freq: Every day | ORAL | Status: DC
  Administered 2022-04-14: 40 mg via ORAL
  Filled 2022-04-13: qty 2

## 2022-04-13 MED ORDER — LEVOTHYROXINE SODIUM 175 MCG PO TABS
175.0000 ug | ORAL_TABLET | Freq: Every day | ORAL | Status: DC
  Administered 2022-04-14: 175 ug via ORAL
  Filled 2022-04-13: qty 1

## 2022-04-13 NOTE — Assessment & Plan Note (Signed)
    Latest Ref Rng & Units 04/13/2022    4:36 AM 04/12/2022    7:24 AM 04/12/2022    3:35 AM  BMP  Glucose 70 - 99 mg/dL 459  977  414   BUN 6 - 20 mg/dL 8  19  24    Creatinine 0.61 - 1.24 mg/dL  2.39  5.32   Sodium 135 - 145 mmol/L 145  141  142   Potassium 3.5 - 5.1 mmol/L 3.2  3.9  4.3   Chloride 98 - 111 mmol/L 113  111  111   CO2 22 - 32 mmol/L 25  21  18    Calcium 8.9 - 10.3 mg/dL 8.9  9.3  9.3   resolved with ivf and insulin . Likely due to imbalance from DKA.

## 2022-04-13 NOTE — Assessment & Plan Note (Signed)
Cont with fluoxetine

## 2022-04-13 NOTE — Consult Note (Addendum)
Inpatient Consultation  Referring Provider:     Dr Manuella Ghazi Admit date 04/11/22 Consult date        04/13/22 Reason for Consultation:   pancreatitis           HPI:   David Ramsey is a 26 y.o. male with history of T1DM, Hashitmotis, and pancreatitis with recent admission 01/20/22 for acute on chronic pancreatitis, who was admitted 2d ago with DKA/AKI, abdominal pain/pancreatitis. Ethanol and UDS test unremarkable. Triglycerides with mild elevation.  He has had some difficulty obtaining his medications due to finances and confusion about where the prescriptions were sent- consult with the diabetic educator and social work to help with meds noted this visit. An endocrine referral was made at last hospitalization but he says this is pending. States he has had no etoh for some time- endorses some mild to moderate use prior to last hospitalization. Denies herbals/illicits/recreation substances/NSAIDS.  Denies an family history of GI disorders, but lots of family with DM. Reports he had some antibiotic earlier this year when treated for UTI. States  his recurrent abdominal pain started earlier this year-describes epigastric pain that worsens with eating and drinking, improves with lying down. Pain tends to radiate downwards. Has some frequent abdominal bloating and endorses problems with acid reflux. Denies any melena/hematochezia/diarrhea. States he had a very light stool about 2w ago but not since then. + Flatus. No other GI concerns at this point.  We discussed his co-existing autoimmune disorders, symptoms, health events and plan of care. . In lab review,  Bili (mostly indirect) /alp/alt somewhat elevated- this has improved with treatment. Lipase continues elevated, last 852. His renal tests have improved as has his K+. He was not anemic on admission- now has some minimal decreased hgb, 11.5. platelets now normal (were elevated). Wbc normal now.  He has had multiple CTs of the A/P since 2/23 demonstrating  some calcifications/changes of chronic pancreatitis and intermittent pancreatic inflammation. Liver has not shown and focal abnormalities [place] has not found any gallstones or bile duct issues- done in march and June of this year  PREVIOUS ENDOSCOPIES:            none   Past Medical History:  Diagnosis Date   AKI (acute kidney injury) ([place]) 12/16/2016   Diabetes mellitus without complication ([place])    INSULIN DEPENDENT   SINCE AGE 27   Diabetic polyneuropathy associated with type 1 diabetes mellitus ([place]) 11/30/2021   DKA (diabetic ketoacidoses) 02/17/2017   Hashimoto's disease    MRSA infection    Pneumonia    Liver contusion, lung effusion, sepsis multiorgan failure, no clear source    Past Surgical History:  Procedure Laterality Date   CHEST TUBE INSERTION      Family History  Problem Relation Age of Onset   Diabetes Cousin    Heart disease Other    Cancer Other    Thyroid disease Other    Healthy Mother      Social History   Tobacco Use   Smoking status: Former    Packs/day: 0.50    Years: 4.00    Total pack years: 2.00    Types: Cigarettes    Quit date: 05/23/2017    Years since quitting: 4.8   Smokeless tobacco: Never  Vaping Use   Vaping Use: Every day   Substances: Nicotine, Flavoring  Substance Use Topics   Alcohol use: Yes    Comment: occ   Drug use: No    Prior to  Admission medications   Medication Sig Start Date End Date Taking? Authorizing Provider  albuterol (VENTOLIN HFA) 108 (90 Base) MCG/ACT inhaler Inhale 2 puffs into the lungs every 6 (six) hours as needed for wheezing or shortness of breath. 11/08/21   Loletha Grayer, MD  blood glucose meter kit and supplies KIT Dispense based on patient and insurance preference. Use up to four times daily as directed. 11/03/20   Raiford Noble Latif, DO  Continuous Blood Gluc Receiver ([place]) [place] 1 each by Does not apply route daily. 03/16/22   Terrilyn Saver, NP  Continuous Blood Gluc Sensor (DEXCOM  G6 SENSOR) MISC 1 each by Does not apply route daily. 03/16/22   Terrilyn Saver, NP  FLUoxetine (PROZAC) 40 MG capsule Take 1 capsule (40 mg total) by mouth daily. 03/22/22   Annita Brod, MD  gabapentin (NEURONTIN) 400 MG capsule Take 1 capsule (400 mg total) by mouth 3 (three) times daily. 03/16/22 04/15/22  Caleen Jobs B, NP  glucose blood test strip Use as instructed 01/22/22   Wyvonnia Dusky, MD  HYDROcodone-acetaminophen (NORCO/VICODIN) 5-325 MG tablet Take 1 tablet by mouth every 4 (four) hours as needed for moderate pain. 02/23/22 02/23/23  Harvest Dark, MD  insulin detemir (LEVEMIR FLEXTOUCH) 100 UNIT/ML FlexPen Inject 15 Units into the skin 2 (two) times daily. 03/22/22   Annita Brod, MD  insulin lispro (HUMALOG KWIKPEN) 200 UNIT/ML KwikPen Take 3 times a day with meals.  1 unit for CBG 121-150, 2 units for 151-200, 3 units for 201-250, 5 units for 251-300, 7 units for 301-350, 9 units for 351-400. 03/22/22   Annita Brod, MD  levothyroxine (SYNTHROID) 175 MCG tablet Take 1 tablet (175 mcg total) by mouth daily. 03/22/22   Annita Brod, MD  ondansetron (ZOFRAN-ODT) 4 MG disintegrating tablet Take 1 tablet (4 mg total) by mouth every 8 (eight) hours as needed for nausea or vomiting. 02/23/22   Harvest Dark, MD  traMADol (ULTRAM) 50 MG tablet Take 1 tablet (50 mg total) by mouth every 6 (six) hours as needed. 03/22/22 03/22/23  Annita Brod, MD  pantoprazole (PROTONIX) 40 MG tablet Take 1 tablet (40 mg total) by mouth daily. 02/18/17 06/06/20  Jonetta Osgood, MD    Current Facility-Administered Medications  Medication Dose Route Frequency Provider Last Rate Last Admin   0.9 %  sodium chloride infusion   Intravenous PRN Max Sane, MD 10 mL/hr at 04/12/22 2329 New Bag at 04/12/22 2329   Chlorhexidine Gluconate Cloth 2 % PADS 6 each  6 each Topical QHS Florina Ou V, MD       dextrose 5 % in lactated ringers infusion   Intravenous Continuous Lucrezia Starch, MD 125 mL/hr at 04/12/22 1450 Infusion Verify at 04/12/22 1450   dextrose 50 % solution 0-50 mL  0-50 mL Intravenous PRN Lucrezia Starch, MD       diphenhydrAMINE (BENADRYL) injection 12.5 mg  12.5 mg Intravenous Q6H PRN Judd Gaudier V, MD   12.5 mg at 04/12/22 2310   [START ON 04/14/2022] FLUoxetine (PROZAC) capsule 40 mg  40 mg Oral Daily Max Sane, MD       heparin injection 5,000 Units  5,000 Units Subcutaneous Q8H Florina Ou V, MD   5,000 Units at 04/13/22 0505   insulin aspart (novoLOG) injection 2-6 Units  2-6 Units Subcutaneous Q4H Max Sane, MD   6 Units at 04/13/22 1226   insulin detemir (LEVEMIR) injection 18 Units  18 Units Subcutaneous Q12H Max Sane, MD   18 Units at 04/13/22 1016   lactated ringers infusion   Intravenous Continuous Lucrezia Starch, MD   Stopped at 04/11/22 2050   [START ON 04/14/2022] levothyroxine (SYNTHROID) tablet 175 mcg  175 mcg Oral Q0600 Max Sane, MD       morphine (PF) 2 MG/ML injection 2 mg  2 mg Intravenous Q4H PRN Max Sane, MD       ondansetron [facility]) injection 4 mg  4 mg Intravenous Q6H PRN Lucrezia Starch, MD   4 mg at 04/13/22 7902   potassium chloride SA (KLOR-CON M) CR tablet 40 mEq  40 mEq Oral Once Max Sane, MD       pregabalin (LYRICA) capsule 75 mg  75 mg Oral Daily Max Sane, MD        Allergies as of 04/11/2022 - Review Complete 04/11/2022  Allergen Reaction Noted   Tape Rash 08/15/2014     Review of Systems:    All systems reviewed and negative except where noted in HPI with exception of chronic blurry vision- last eye exam this year     Physical Exam:  Vital signs in last 24 hours: Temp:  [97.7 F (36.5 C)-98.3 F (36.8 C)] 97.9 F (36.6 C) (08/07 0743) Pulse Rate:  [81-118] 85 (08/07 0743) Resp:  [14-19] 16 (08/07 0743) BP: (123-139)/(73-79) 126/78 (08/07 0743) SpO2:  [98 %-100 %] 98 % (08/07 0743) Last BM Date :  (Unknown) General:   Pleasant young in NAD Head:  Normocephalic and atraumatic. Eyes:    No icterus.   Conjunctiva pink. Ears:  Normal auditory acuity. Mouth: Mucosa pink moist, no lesions. Neck:  Supple; no masses felt Lungs:  Respirations even and unlabored. Lungs clear to auscultation bilaterally.   No wheezes, crackles, or rhonchi.  Heart:  S1S2, RRR, no MRG. No edema. Abdomen:   Flat, soft, nondistended, tender in the epigastrum and luq. Normal bowel sounds. No appreciable masses or hepatomegaly. No rebound signs or other peritoneal signs. Rectal:  Not performed.  Msk:  MAEW x4, No clubbing or cyanosis. Strength 5/5. Symmetrical without gross deformities. Neurologic:  Alert and  oriented x4;  Cranial nerves II-XII intact.  Skin:  Warm, dry, pink without significant lesions or rashes. Psych:  Alert and cooperative. Normal affect.  LAB RESULTS: Recent Labs    04/11/22 1456 04/13/22 0436  WBC 18.9* 8.5  HGB 14.4 11.5*  HCT 45.0 34.0*  PLT 533* 355   BMET Recent Labs    04/12/22 0335 04/12/22 0724 04/13/22 0436  NA 142 141 145  K 4.3 3.9 3.2*  CL 111 111 113*  CO2 18* 21* 25  GLUCOSE 175* 175* 103*  BUN 24* 19 8  CREATININE 1.26* 1.23 0.85  CALCIUM 9.3 9.3 8.9   LFT Recent Labs    04/13/22 0436  PROT 5.8*  ALBUMIN 2.9*  AST 20  ALT 37  ALKPHOS 85  BILITOT 0.9   PT/INR Recent Labs    04/11/22 1526  LABPROT 15.0  INR 1.2    STUDIES: CT HEAD WO CONTRAST (5MM)  Result Date: 04/11/2022 CLINICAL DATA:  Mental status change, unknown cause EXAM: CT HEAD WITHOUT CONTRAST TECHNIQUE: Contiguous axial images were obtained from the base of the skull through the vertex without intravenous contrast. RADIATION DOSE REDUCTION: This exam was performed according to the departmental dose-optimization program which includes automated exposure control, adjustment of the mA and/or kV according to patient size and/or use of iterative reconstruction  technique. COMPARISON:  None Available. FINDINGS: Brain: No acute intracranial abnormality. Specifically, no hemorrhage,  hydrocephalus, mass lesion, acute infarction, or significant intracranial injury. Vascular: No hyperdense vessel or unexpected calcification. Skull: No acute calvarial abnormality. Sinuses/Orbits: No acute findings Other: None IMPRESSION: Normal study. Electronically Signed   By: Rolm Baptise M.D.   On: 04/11/2022 17:46   CT ABDOMEN PELVIS W CONTRAST  Result Date: 04/11/2022 CLINICAL DATA:  Abdominal pain, acute, nonlocalized EXAM: CT ABDOMEN AND PELVIS WITH CONTRAST TECHNIQUE: Multidetector CT imaging of the abdomen and pelvis was performed using the standard protocol following bolus administration of intravenous contrast. RADIATION DOSE REDUCTION: This exam was performed according to the departmental dose-optimization program which includes automated exposure control, adjustment of the mA and/or kV according to patient size and/or use of iterative reconstruction technique. CONTRAST:  140m OMNIPAQUE IOHEXOL 300 MG/ML  SOLN COMPARISON:  03/21/2022 FINDINGS: Lower chest: Linear scarring in the right lung base. No effusions. No acute abnormality. Hepatobiliary: No focal hepatic abnormality. Gallbladder unremarkable. Pancreas: Calcifications in the pancreatic head and uncinate process compatible with chronic pancreatitis. No ductal dilatation or focal abnormality otherwise. Spleen: No focal abnormality.  Normal size. Adrenals/Urinary Tract: No adrenal abnormality. No focal renal abnormality. No stones or hydronephrosis. Urinary bladder is unremarkable. Stomach/Bowel: Normal appendix. Stomach, large and small bowel grossly unremarkable. Vascular/Lymphatic: No evidence of aneurysm or adenopathy. Reproductive: No visible focal abnormality. Other: No free fluid or free air. Musculoskeletal: No acute bony abnormality. IMPRESSION: No acute findings in the abdomen or pelvis. Right basilar scarring. Changes of chronic pancreatitis, stable. Electronically Signed   By: KRolm BaptiseM.D.   On: 04/11/2022 17:45   [place]  LIMITED RUQ (LIVER/GB)  Result Date: 04/11/2022 CLINICAL DATA:  Abdominal pain, acute nonlocalized. Elevated bilirubin level. EXAM: ULTRASOUND ABDOMEN LIMITED RIGHT UPPER QUADRANT COMPARISON:  Abdominopelvic CT 03/21/2022. Abdominal ultrasound 01/20/2022. FINDINGS: Gallbladder: No gallstones or wall thickening visualized. No sonographic Murphy sign noted by sonographer. Common bile duct: Diameter: 2 mm Liver: No focal lesion identified. Within normal limits in parenchymal echogenicity. Portal vein is patent on color Doppler imaging with normal direction of blood flow towards the liver. Other: None. IMPRESSION: Unchanged right upper quadrant abdominal ultrasound. No biliary dilatation. Electronically Signed   By: WRichardean SaleM.D.   On: 04/11/2022 17:29   DG Chest Port 1 View  Result Date: 04/11/2022 CLINICAL DATA:  Questionable sepsis - evaluate for abnormality EXAM: PORTABLE CHEST 1 VIEW COMPARISON:  March 20, 2022 FINDINGS: The cardiomediastinal silhouette is normal in contour. No pleural effusion. No pneumothorax. No acute pleuroparenchymal abnormality. Visualized abdomen is unremarkable. No acute osseous abnormality noted. IMPRESSION: No acute cardiopulmonary abnormality. Electronically Signed   By: SValentino SaxonM.D.   On: 04/11/2022 16:01       Impression / Plan:   Acute on chronic pancreatitis- concerning for an autoimmune process- will assess  IgG4, IgG, and antinuclear antibody. Agree with restarting IV fluids and pain control. WIll follow with clinical course- may need further imaging/eval of pancreas  (MRI/EUS/other) as clinically indicated. Also agree with social service help for medication/care and diabetes interventions. [place] living now in [place] would like some local o/p providers- endocrine, etc.  GJerrye Bushy start pantoprazole [id number]  Thank you very much for this consult. These services were provided by ChStephens NovemberNP-C, in collaboration with [employer],  with whom I have discussed this patient in full.   ChStephens NovemberNP-C

## 2022-04-13 NOTE — Inpatient Diabetes Management (Addendum)
Inpatient Diabetes Program Recommendations  AACE/ADA: New Consensus Statement on Inpatient Glycemic Control   Target Ranges:  Prepandial:   less than 140 mg/dL      Peak postprandial:   less than 180 mg/dL (1-2 hours)      Critically ill patients:  140 - 180 mg/dL    Latest Reference Range & Units 04/13/22 07:35 04/13/22 08:24 04/13/22 10:11 04/13/22 11:05  Glucose-Capillary 70 - 99 mg/dL 58 (L) 89 245 (H)  Levemir 18 units 260 (H)    Latest Reference Range & Units 04/13/22 23:11 04/13/22 00:07 04/13/22 04:10  Glucose-Capillary 70 - 99 mg/dL     Levemir 18 units 809 (H)  Novolog 6 units 113 (H)    Latest Reference Range & Units 04/12/22 08:06 04/12/22 10:10 04/12/22 11:49 04/12/22 13:16 04/12/22 16:00 04/12/22 19:44  Glucose-Capillary 70 - 99 mg/dL 983 (H) 382 (H) 505 (H)     Levemir 18 units @11 :31 151 (H)  Novolog 4 units 200 (H)  Novolog 4 units 227 (H)  Novolog 6 units   Review of Glycemic Control  Diabetes history: DM1 (makes NO insulin; requires basal, correction, and carbohydrate coverage insulin) Outpatient Diabetes medications: Levemir 15 units daily, Humalog 1 unit for every 8 grams of carbs, Humalog 1 unit for every 20 mg/dl above target of mg/dl Current orders for Inpatient glycemic control: Levemir 18 units Q12H, Novolog 2-6 units Q4H  Inpatient Diabetes Program Recommendations:    Insulin: Noted glucose 58 mg/dl at 397 am today. Hypoglycemia likely due to getting Novolog 6 units at 00:13.   Please discontinue ICU Glycemic Control order set (Novolog 2-6 units Q4H) and use regular Glycemic Control order set to order CBGs AC&HS, Novolog 0-9 units TID with meals, Novolog 0-5 units QHS, and Novolog 3 units TID with meals for meal coverage if patient eats at least 50% of meals.  Outpatient DM: Per benefit check, Humalog 75/25 copay would be $15. Humalog 75/25 would provide basal and bolus insulin and allow patient to only have one copay versus using Levemir  and Humalog. May want to consider switching from Levemir and Humalog outpatient to Humalog 75/25 insulin.   NOTE: Patient is well known to inpatient diabetes team due to frequent hospital admissions. Patient was last inpatient 03/20/22-03/22/22 and inpatient diabetes coordinator last spoke with patient on 03/22/22. Per chart, patient has insurance but no PCP. Patient has been advised to get established with a PCP on multiple occassions. Per note by Dr. 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  Will plan to follow up with patient today.  Addendum 04/13/22@11 :25-Spoke with patient at bedside regarding DM. Patient states that he ran out of insulin 3 days prior to admission. He reports that he was taking Levemir 15 units daily and Humalog 1 unit per 8 grams of carbs and Humalog 1 unit for every 20 mg/dl above target glucose of 120 mg/dl. Patient states that he has 06/13/22 through his mother (can stay on insurance until he turns 26 years old). He confirms that he lost his job and that he is not able to afford the copays for the Levemir or Humalog. Patient reports that his copay for each insulin is $15 and he can not pay the copay. Patient states that he has not applied for any type  of assistance such as Medicaid. Discussed that given he has insurance, not sure what TOC would be able to provide but informed patient that I would reach out to [facility] to see if there are any resources to help pay his copays for his insulin. Will also ask for a benefit check to see if there are any insulins that have lower copays with current insurance. Patient states he now has a PCP Hyman Hopes, NP) and last seen T. Reola Calkins, NP on 03/16/22. Stressed to patient that he will need to find a way to get his insulins to keep him healthy,out of DKA, and  prevent further complications from uncontrolled DM.  Patient verbalized understanding of information and has no questions at this time.  Per Charlett Nose, CPhT benefit check, copays would be: Humalog 75/25 - $15 Levemir last filled 7/29 (no copay provided) Lyumjev $15,  Humalog $165 (co-pay assistance is available)   Thanks, Orlando Penner, RN, MSN, CDCES Diabetes Coordinator Inpatient Diabetes Program [phone] (Team Pager from 8am to 5pm)

## 2022-04-13 NOTE — Progress Notes (Signed)
       CROSS COVER NOTE  NAME: David Ramsey MRN: [id number] DOB : 05-14-1996    Date of Service   04/13/22  HPI/Events of Note   Contacted by nursing to report hypoglycemic event. CBG treated per hypoglycemia protocol. CBG 48-->51-->52-->155  Interventions   Plan: Decrease Levemir to 14U q12H- 25% dose reduction while NPO     This document was prepared using Dragon voice recognition software and may include unintentional dictation errors.  Bishop Limbo DNP, MHA, FNP-BC Nurse Practitioner [facility] Pager [phone]

## 2022-04-13 NOTE — Assessment & Plan Note (Signed)
Patient presenting with nausea vomiting abdominal pain found to be in anion gap acidosis attributed to DKA with poorly controlled diabetes mellitus type 1. 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  He also reports that last time there was some medication confusion about where his prescriptions were sent.  And he could not get his insulin. He is transitioned off insulin drip and now on insulin Levemir and NovoLog Hemoglobin A1c 10 suggestive of poor control diabetic nurse following

## 2022-04-13 NOTE — Progress Notes (Signed)
   04/13/22 2040  Provider Notification  Provider Name/Title Bishop Limbo, NP  Date Provider Notified 04/13/22  Time Provider Notified 2028  Method of Notification Page  Notification Reason Other (Comment) (CBG: 48)  Provider response Other (Comment) (follow hypoglycemia protocol. 8 oz OJ given rechecked 15 mins later CBG: 51 another 8 oz OJ given rechecked 15 mins later CBG: 52 15mL of d50 given will recheck in 15 mins.)  Date of Provider Response 04/13/22  Time of Provider Response 2029   25 mL d50 given, 15 min recheck CBG: went up to 155 mg/dL.  Bishop Limbo, NP, and Charge nurse (Dorie, RN) notified of results.

## 2022-04-13 NOTE — TOC Progression Note (Signed)
Transition of Care [facility]) - Progression Note    Patient Details  Name: David Ramsey MRN: [id number] Date of Birth: September 24, 1995  Transition of Care [facility]) CM/SW Contact  Truddie Hidden, RN Phone Number: 04/13/2022, 12:22 PM  Clinical Narrative:   Patient information sent to financial counselor for assistance with medicaid.         Expected Discharge Plan and Services                                                 Social Determinants of Health (SDOH) Interventions    Readmission Risk Interventions    03/21/2022   11:18 AM  Readmission Risk Prevention Plan  Transportation Screening Complete  PCP or Specialist Appt within 3-5 Days Complete  HRI or Home Care Consult Complete  Social Work Consult for Recovery Care Planning/Counseling Complete  Palliative Care Screening Not Applicable  Medication Review [employer]) Complete

## 2022-04-13 NOTE — Assessment & Plan Note (Signed)
Likely due to acute on chronic pancreatitis although his CT abdomen shows no acute pancreatitis.  His lipase and amylase are elevated.  We will request GI consultation

## 2022-04-13 NOTE — Assessment & Plan Note (Signed)
His admission CT scan showed findings consistent with chronic pancreatitis Elevated lipase 230-> 852 Elevated amylase 1120 Patient was tried on clear liquid diet but did not tolerate and started having severe pain.  He does not have any vomiting but does feel nauseous We will request GI consultation.  Monitor amylase and lipase.  We will make him n.p.o. except sips with meds for now

## 2022-04-13 NOTE — Assessment & Plan Note (Signed)
Monitor liver function test    Component Value Date/Time   PROT 5.8 (L) 04/13/2022 0436   ALBUMIN 2.9 (L) 04/13/2022 0436   AST 20 04/13/2022 0436   ALT 37 04/13/2022 0436   ALKPHOS 85 04/13/2022 0436   BILITOT 0.9 04/13/2022 0436   BILIDIR 0.2 03/20/2022 1555   IBILI 2.5 (H) 03/20/2022 1555  Sepsis ruled out.  LFTs improved with hydration

## 2022-04-13 NOTE — Progress Notes (Addendum)
Progress Note   Patient: David Ramsey WVP:[id number] DOB: December 09, 1995 DOA: 04/11/2022     2 DOS: the patient was seen and examined on 04/13/2022   Brief hospital course: 26 year old male admitted for DKA  8/6: Transitioned off insulin drip.  Transfer to MedSurg 8/7: Episode of hypoglycemia this morning.  GI consult as he is amylase and lipase are going up   Assessment and Plan: * Nausea & vomiting Nausea and vomiting attributed to DKA and likely acute on chronic pancreatitis. Continue supportive care.   We will keep him n.p.o. except sips with meds. Patient has not had any further vomiting but started having a lot of pain while he was eating lunch.   Abdominal pain Likely due to acute on chronic pancreatitis although his CT abdomen shows no acute pancreatitis.  His lipase and amylase are elevated.  We will request GI consultation  DKA, type 1 (HCC) Patient presenting with nausea vomiting abdominal pain found to be in anion gap acidosis attributed to DKA with poorly controlled diabetes mellitus type 1. 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  He also reports that last time there was some medication confusion about where his prescriptions were sent.  And he could not get his insulin. He is transitioned off insulin drip and now on insulin Levemir and NovoLog Hemoglobin A1c 10 suggestive of poor control diabetic nurse following  High anion gap metabolic acidosis Due to combination of DKA, acute kidney injury, lactic acidosis. Now resolved with treatment of DKA.  Hyperkalemia    Latest Ref Rng & Units 04/13/2022    4:36 AM 04/12/2022    7:24 AM 04/12/2022    3:35 AM  BMP  Glucose 70 - 99 mg/dL 546  270  350   BUN 6 - 20 mg/dL 8  19  24    Creatinine 0.61 - 1.24 mg/dL  0.93  8.18   Sodium 135 - 145 mmol/L 145  141  142   Potassium 3.5 - 5.1 mmol/L  3.2  3.9  4.3   Chloride 98 - 111 mmol/L 113  111  111   CO2 22 - 32 mmol/L 25  21  18    Calcium 8.9 - 10.3 mg/dL 8.9  9.3  9.3   resolved with ivf and insulin . Likely due to imbalance from DKA.   AKI (acute kidney injury) [facility]) Lab Results  Component Value Date   CREATININE 0.85 04/13/2022   CREATININE 1.23 04/12/2022   CREATININE 1.26 (H) 04/12/2022  Again attribute to dehydration, DKA. Renal function at baseline now after IV hydration    Essential hypertension Controlled  Elevated LFTs Monitor liver function test    Component Value Date/Time   PROT 5.8 (L) 04/13/2022 0436   ALBUMIN 2.9 (L) 04/13/2022 0436   AST 20 04/13/2022 0436   ALT 37 04/13/2022 0436   ALKPHOS 85 04/13/2022 0436   BILITOT 0.9 04/13/2022 0436   BILIDIR 0.2 03/20/2022 1555   IBILI 2.5 (H) 03/20/2022 1555  Sepsis ruled out.  LFTs improved with hydration   Acute on chronic pancreatitis [facility]) His admission CT scan showed findings consistent with chronic pancreatitis Elevated lipase 230-> 852 Elevated amylase 1120 Patient was tried on clear liquid diet but did not tolerate and started having severe pain.  He does not have any vomiting but does feel nauseous We will  request GI consultation.  Monitor amylase and lipase.  We will make him n.p.o. except sips with meds for now  Hypothyroidism Cont levothyroxine at 175 mcg.  Normal TSH.   Anxiety and depression Cont with fluoxetine         Subjective: Had episode of hypoglycemia (58) earlier in the morning which resolved.  No vomiting.  Does feel nauseous.  Requesting to see to assist him with his medications as he struggles with his finances.  Later around lunchtime he requested to switch his gabapentin to Lyrica and some stronger pain medicine as he started having pain while eating lunch  Physical Exam: Vitals:   04/12/22 1654 04/12/22 1937 04/13/22 0350 04/13/22 0743  BP: 133/79 125/76 123/73 126/78  Pulse: 95 (!) 108 81 85  Resp: 16 17  17 16   Temp: 98.1 F (36.7 C) 98.3 F (36.8 C) 97.7 F (36.5 C) 97.9 F (36.6 C)  TempSrc: Oral Oral  Oral  SpO2: 100% 98% 98% 98%  Weight:      Height:       26 year old male lying in the bed comfortably without any acute distress Lungs clear to auscultation bilaterally Cardiovascular regular rate and rhythm Abdomen soft, benign Neuro alert and oriented, nonfocal Psych normal mood and affect Data Reviewed:  Potassium 3.2, elevated lipase of 852 and amylase of 1120  Family Communication: None  Disposition: Status is: Inpatient Remains inpatient appropriate because: Management of possible acute on chronic pancreatitis and better blood sugar control.  He had episode of hypoglycemia earlier which is resolved now   Planned Discharge Destination: Home    DVT prophylaxis-heparin Time spent: 35 minutes  Author: 26, MD 04/13/2022 1:39 PM  For on call review [email or web address].

## 2022-04-13 NOTE — Assessment & Plan Note (Signed)
Lab Results  Component Value Date   CREATININE 0.85 04/13/2022   CREATININE 1.23 04/12/2022   CREATININE 1.26 (H) 04/12/2022  Again attribute to dehydration, DKA. Renal function at baseline now after IV hydration

## 2022-04-13 NOTE — Telephone Encounter (Signed)
Pharmacy Patient Advocate Encounter  Insurance verification completed.    The patient is insured through [employer]   The patient is currently admitted and ran test claims for the following: Lantus, Levemir, Semglee, Basaglar, Novolog, Humalog, Apidra, Lyumjev, Novolog 70/30, Novolin 70/30, Humalog 70/30.  Copays and coinsurance results were relayed to Inpatient clinical team.

## 2022-04-13 NOTE — Assessment & Plan Note (Signed)
Cont levothyroxine at 175 mcg.  Normal TSH.

## 2022-04-13 NOTE — Progress Notes (Signed)
Found chocolate pudding at bedside table upon arriving to room. Disposed the item. Pt. currently on clear liquid diet.Reiterated and educated on the list of items considered int he clear liquid category. Pt. Verbalized udnerstansing.

## 2022-04-13 NOTE — Assessment & Plan Note (Signed)
Nausea and vomiting attributed to DKA and likely acute on chronic pancreatitis. Continue supportive care.   We will keep him n.p.o. except sips with meds. Patient has not had any further vomiting but started having a lot of pain while he was eating lunch.

## 2022-04-13 NOTE — Assessment & Plan Note (Signed)
Controlled.  

## 2022-04-13 NOTE — Assessment & Plan Note (Signed)
Due to combination of DKA, acute kidney injury, lactic acidosis. Now resolved with treatment of DKA 

## 2022-04-13 NOTE — TOC Benefit Eligibility Note (Signed)
Patient Product/process development scientist completed.    The patient is currently admitted and upon discharge could be taking Lyumjev. The current 30 day co-pay is $15.   The patient is currently admitted and upon discharge could be taking Humalog. The current 30 day co-pay is $165. Co-pay assistance is available for this medication.  The patient is currently admitted and upon discharge could be taking Novolog. This medication is non-formulary for this plan.  The patient is currently admitted and upon discharge could be taking Apidra. This medication is non-formulary for this plan.  The patient is currently admitted and upon discharge could be taking Levemir. This medication was recently filled at a retail location (04/04/22). Test billing unable to confirm co-pay.  The patient is currently admitted and upon discharge could be taking Lantus. This medication is non-formulary for this plan.  The patient is currently admitted and upon discharge could be taking Semglee. This medication is non-formulary for this plan.  The patient is currently admitted and upon discharge could be taking Basaglar. This medication is non-formulary for this plan.  The patient is currently admitted and upon discharge could be taking Humalog 75/25. The current 30 day co-pay is $15.   The patient is currently admitted and upon discharge could be taking Novolog 70/30. This medication is non-formulary for this plan.  The patient is currently admitted and upon discharge could be taking Novolin 70/30. This medication is non-formulary for this plan.  The patient is insured through [employer], CPhT Rx Patient Advocate Specialist Phone: [phone]

## 2022-04-14 ENCOUNTER — Other Ambulatory Visit (HOSPITAL_COMMUNITY): Payer: Self-pay

## 2022-04-14 DIAGNOSIS — R1114 Bilious vomiting: Secondary | ICD-10-CM | POA: Diagnosis not present

## 2022-04-14 DIAGNOSIS — K859 Acute pancreatitis without necrosis or infection, unspecified: Secondary | ICD-10-CM | POA: Diagnosis not present

## 2022-04-14 DIAGNOSIS — R1084 Generalized abdominal pain: Secondary | ICD-10-CM | POA: Diagnosis not present

## 2022-04-14 DIAGNOSIS — N179 Acute kidney failure, unspecified: Secondary | ICD-10-CM | POA: Diagnosis not present

## 2022-04-14 LAB — CBC
HCT: 34.5 % — ABNORMAL LOW (ref 39.0–52.0)
Hemoglobin: 11.6 g/dL — ABNORMAL LOW (ref 13.0–17.0)
MCH: 30.1 pg (ref 26.0–34.0)
MCHC: 33.6 g/dL (ref 30.0–36.0)
MCV: 89.4 fL (ref 80.0–100.0)
Platelets: 332 10*3/uL (ref 150–400)
RBC: 3.86 MIL/uL — ABNORMAL LOW (ref 4.22–5.81)
RDW: 11.9 % (ref 11.5–15.5)
WBC: 5 10*3/uL (ref 4.0–10.5)
nRBC: 0 % (ref 0.0–0.2)

## 2022-04-14 LAB — COMPREHENSIVE METABOLIC PANEL
ALT: 29 U/L (ref 0–44)
AST: 22 U/L (ref 15–41)
Albumin: 3 g/dL — ABNORMAL LOW (ref 3.5–5.0)
Alkaline Phosphatase: 86 U/L (ref 38–126)
Anion gap: 5 (ref 5–15)
BUN: 5 mg/dL — ABNORMAL LOW (ref 6–20)
CO2: 30 mmol/L (ref 22–32)
Calcium: 9.1 mg/dL (ref 8.9–10.3)
Chloride: 107 mmol/L (ref 98–111)
Creatinine, Ser: 0.78 mg/dL (ref 0.61–1.24)
GFR, Estimated: >60 mL/min (ref >60)
Glucose, Bld: 110 mg/dL — ABNORMAL HIGH (ref 70–99)
Potassium: 3.6 mmol/L (ref 3.5–5.1)
Sodium: 142 mmol/L (ref 135–145)
Total Bilirubin: 1.1 mg/dL (ref 0.3–1.2)
Total Protein: 6 g/dL — ABNORMAL LOW (ref 6.5–8.1)

## 2022-04-14 LAB — IGG, IGA, IGM
IgA: 187 mg/dL (ref 90–386)
IgG (Immunoglobin G), Serum: 705 mg/dL (ref 603–1613)
IgM (Immunoglobulin M), Srm: 36 mg/dL (ref 20–172)

## 2022-04-14 LAB — ANA COMPREHENSIVE PANEL

## 2022-04-14 LAB — TRIGLYCERIDES: Triglycerides: 270 mg/dL — ABNORMAL HIGH (ref <150)

## 2022-04-14 LAB — GLUCOSE, CAPILLARY
Glucose-Capillary: 107 mg/dL — ABNORMAL HIGH (ref 70–99)
Glucose-Capillary: 119 mg/dL — ABNORMAL HIGH (ref 70–99)
Glucose-Capillary: 250 mg/dL — ABNORMAL HIGH (ref 70–99)
Glucose-Capillary: 290 mg/dL — ABNORMAL HIGH (ref 70–99)
Glucose-Capillary: 435 mg/dL — ABNORMAL HIGH (ref 70–99)
Glucose-Capillary: 451 mg/dL — ABNORMAL HIGH (ref 70–99)

## 2022-04-14 LAB — IGG 4: IgG, Subclass 4: 25 mg/dL (ref 2–96)

## 2022-04-14 MED ORDER — INSULIN ASPART 100 UNIT/ML IJ SOLN
6.0000 [IU] | INTRAMUSCULAR | Status: AC
  Administered 2022-04-14: 6 [IU] via SUBCUTANEOUS
  Filled 2022-04-14: qty 1

## 2022-04-14 MED ORDER — HUMALOG MIX 75/25 (75-25) 100 UNIT/ML ~~LOC~~ SUSP: 20.0000 [IU] | Freq: Two times a day (BID) | SUBCUTANEOUS | 0 refills | Status: DC

## 2022-04-14 MED ORDER — TRAMADOL HCL 50 MG PO TABS: 50.0000 mg | ORAL_TABLET | Freq: Two times a day (BID) | ORAL | 0 refills | Status: AC | PRN

## 2022-04-14 MED ORDER — TRAMADOL HCL 50 MG PO TABS: 50.0000 mg | ORAL_TABLET | Freq: Four times a day (QID) | ORAL | Status: DC | PRN

## 2022-04-14 MED ORDER — PANTOPRAZOLE SODIUM 40 MG PO TBEC
40.0000 mg | DELAYED_RELEASE_TABLET | Freq: Every day | ORAL | 0 refills | Status: DC
Start: 2022-04-15 — End: 2022-08-24

## 2022-04-14 MED ORDER — PREGABALIN 75 MG PO CAPS: 75.0000 mg | ORAL_CAPSULE | Freq: Every day | ORAL | 0 refills | Status: DC

## 2022-04-14 NOTE — Progress Notes (Signed)
Blood pressure (!) 136/91, pulse 78, temperature 97.7 F (36.5 C), resp. rate 16, height 5\' 6"  (1.676 m), weight 70.8 kg, SpO2 100 %. IV removed site c/d/I, discussed d/c packet with pt at bedside he understood information provided, pt d/c with all things at bedside and transported via w/c down to private car.

## 2022-04-14 NOTE — Inpatient Diabetes Management (Addendum)
Inpatient Diabetes Program Recommendations  AACE/ADA: New Consensus Statement on Inpatient Glycemic Control   Target Ranges:  Prepandial:   less than 140 mg/dL      Peak postprandial:   less than 180 mg/dL (1-2 hours)      Critically ill patients:  140 - 180 mg/dL    Latest Reference Range & Units 04/14/22 00:01 04/14/22 04:07 04/14/22 07:41  Glucose-Capillary 70 - 99 mg/dL 578 (H) 469 (H) 629 (H)    Latest Reference Range & Units 04/13/22 12:11 04/13/22 16:31 04/13/22 19:58 04/13/22 20:12 04/13/22 20:26 04/13/22 20:53  Glucose-Capillary 70 - 99 mg/dL 528 (H) 413 (H) 48 (L) 51 (L) 52 (L) 155 (H)    Latest Reference Range & Units 04/13/22 00:07 04/13/22 04:10 04/13/22 07:35 04/13/22 08:24 04/13/22 10:11 04/13/22 11:05  Glucose-Capillary 70 - 99 mg/dL 244 (H) 010 (H) 58 (L) 89 168 (H) 260 (H)   Review of Glycemic Control  Diabetes history: DM1 (makes NO insulin; requires basal, correction, and carbohydrate coverage insulin) Outpatient Diabetes medications: Levemir 15 units daily, Humalog 1 unit for every 8 grams of carbs, Humalog 1 unit for every 20 mg/dl above target of 272 mg/dl Current orders for Inpatient glycemic control: Levemir 14 units Q12H, Novolog 0-6 units Q4H   Inpatient Diabetes Program Recommendations:    Insulin: Noted Levemir decreased from 18 to 14 units Q12H due to hypoglycemia. Patient now ordered clear liquid diet.  Outpatient DM: Per benefit check, Humalog 75/25 copay would be $15. Humalog 75/25 would provide basal and bolus insulin and allow patient to only have one copay versus using Levemir and Humalog with 2 copays. May want to consider switching from Levemir and Humalog outpatient to Humalog 75/25 insulin. If so, would recommend Humalog 75/25 20 units BID (dose will provide a total of 30 units for basal and 10 units for meal coverage per day).   Thanks, Orlando Penner, RN, MSN, CDCES Diabetes Coordinator Inpatient Diabetes Program [phone] (Team Pager from 8am  to 5pm)

## 2022-04-14 NOTE — Progress Notes (Signed)
Inpatient Follow-up/Progress Note   Patient ID: David Ramsey is a 26 y.o. male.  Overnight Events / Subjective Findings Patient feels like his appetite is improved today.  Dull ache is present, but nausea is improved.  No vomiting overnight.  Patient did have a bowel movement overnight. Fasting triglycerides in 200s MRI MRCP reviewed and some mild pancreatic edema, but ducts and biliary tree wnl Sent off chromosomal studies. No other acute gi complaints.  Review of Systems  Constitutional:  Negative for activity change, appetite change, chills, diaphoresis, fatigue, fever and unexpected weight change.  HENT:  Negative for trouble swallowing and voice change.   Respiratory:  Negative for shortness of breath and wheezing.   Cardiovascular:  Negative for chest pain, palpitations and leg swelling.  Gastrointestinal:  Positive for abdominal pain and nausea. Negative for abdominal distention, anal bleeding, blood in stool, constipation, diarrhea and vomiting.  Musculoskeletal:  Negative for arthralgias and myalgias.  Skin:  Negative for color change and pallor.  Neurological:  Negative for dizziness, syncope and weakness.  Psychiatric/Behavioral:  Negative for confusion. The patient is not nervous/anxious.   All other systems reviewed and are negative.    Medications  Current Facility-Administered Medications:    0.9 %  sodium chloride infusion, , Intravenous, PRN, Delfino Lovett, MD, Last Rate: 10 mL/hr at 04/12/22 2329, New Bag at 04/12/22 2329   Chlorhexidine Gluconate Cloth 2 % PADS 6 each, 6 each, Topical, QHS, Gertha Calkin, MD, 6 each at 04/13/22 2156   dextrose 5 % in lactated ringers infusion, , Intravenous, Continuous, Gilles Chiquito, MD, Last Rate: 125 mL/hr at 04/14/22 0425, New Bag at 04/14/22 0425   dextrose 50 % solution 0-50 mL, 0-50 mL, Intravenous, PRN, Gilles Chiquito, MD, 25 mL at 04/13/22 2037   FLUoxetine (PROZAC) capsule 40 mg, 40 mg, Oral, Daily, Sherryll Burger, Vipul,  MD   heparin injection 5,000 Units, 5,000 Units, Subcutaneous, Q8H, Irena Cords V, MD, 5,000 Units at 04/14/22 0616   insulin aspart (novoLOG) injection 0-6 Units, 0-6 Units, Subcutaneous, Q4H, Delfino Lovett, MD, 3 Units at 04/14/22 0008   insulin detemir (LEVEMIR) injection 14 Units, 14 Units, Subcutaneous, Q12H, Foust, Katy L, NP, 14 Units at 04/13/22 2257   lactated ringers infusion, , Intravenous, Continuous, Gilles Chiquito, MD, Last Rate: 125 mL/hr at 04/14/22 0011, New Bag at 04/14/22 0011   levothyroxine (SYNTHROID) tablet 175 mcg, 175 mcg, Oral, Q0600, Delfino Lovett, MD, 175 mcg at 04/14/22 0616   morphine (PF) 2 MG/ML injection 2 mg, 2 mg, Intravenous, Q4H PRN, Delfino Lovett, MD, 2 mg at 04/14/22 0759   ondansetron (ZOFRAN) injection 4 mg, 4 mg, Intravenous, Q6H PRN, Gilles Chiquito, MD, 4 mg at 04/13/22 1856   pantoprazole (PROTONIX) EC tablet 40 mg, 40 mg, Oral, Daily, Louellen Molder, NP, 40 mg at 04/14/22 0758   pregabalin (LYRICA) capsule 75 mg, 75 mg, Oral, Daily, Delfino Lovett, MD, 75 mg at 04/13/22 1408   traMADol (ULTRAM) tablet 50 mg, 50 mg, Oral, Q6H PRN, Delfino Lovett, MD  sodium chloride 10 mL/hr at 04/12/22 2329   dextrose 5% lactated ringers 125 mL/hr at 04/14/22 0425   lactated ringers 125 mL/hr at 04/14/22 0011    sodium chloride, dextrose, morphine injection, ondansetron (ZOFRAN) IV, traMADol   Objective    Vitals:   04/13/22 1628 04/13/22 1956 04/14/22 0420 04/14/22 0756  BP: 131/87 133/83 122/79 (!) 135/93  Pulse: 82 79 76 73  Resp: 16 17 17  16  Temp: 98.9 F (37.2 C) 98.1 F (36.7 C) 97.7 F (36.5 C) 98.1 F (36.7 C)  TempSrc:      SpO2: 99% 100% 100% 99%  Weight:      Height:         Physical Exam Vitals and nursing note reviewed.  Constitutional:      Appearance: Normal appearance. He is not toxic-appearing or diaphoretic.     Comments: Somewhat uncomfortable appearing, but seems to be resting ok  HENT:     Head: Normocephalic and  atraumatic.     Nose: Nose normal.     Mouth/Throat:     Mouth: Mucous membranes are moist.     Pharynx: Oropharynx is clear.  Eyes:     General: No scleral icterus.    Extraocular Movements: Extraocular movements intact.  Cardiovascular:     Rate and Rhythm: Normal rate and regular rhythm.     Heart sounds: Normal heart sounds. No murmur heard.    No friction rub. No gallop.  Pulmonary:     Effort: Pulmonary effort is normal. No respiratory distress.     Breath sounds: Normal breath sounds. No wheezing, rhonchi or rales.  Abdominal:     General: Bowel sounds are normal. There is no distension.     Palpations: Abdomen is soft.     Tenderness: There is abdominal tenderness (mild). There is no guarding or rebound.  Musculoskeletal:     Cervical back: Neck supple.     Right lower leg: No edema.     Left lower leg: No edema.  Skin:    General: Skin is warm and dry.     Coloration: Skin is not jaundiced or pale.  Neurological:     General: No focal deficit present.     Mental Status: He is alert and oriented to person, place, and time. Mental status is at baseline.  Psychiatric:        Mood and Affect: Mood normal.        Behavior: Behavior normal.        Thought Content: Thought content normal.        Judgment: Judgment normal.      Laboratory Data Recent Labs  Lab 04/11/22 1456 04/13/22 0436 04/14/22 0530  WBC 18.9* 8.5 5.0  HGB 14.4 11.5* 11.6*  HCT 45.0 34.0* 34.5*  PLT 533* 355 332   Recent Labs  Lab 04/11/22 1456 04/11/22 1831 04/12/22 0724 04/13/22 0436 04/14/22 0530  NA 132*   < > 141 145 142  K 6.8*   < > 3.9 3.2* 3.6  CL 93*   < > 111 113* 107  CO2 14*   < > 21* 25 30  BUN 38*   < > 19 8 5*  CREATININE 1.71*   < > 1.23 0.85 0.78  CALCIUM 10.0   < > 9.3 8.9 9.1  PROT 7.9  --   --  5.8* 6.0*  BILITOT 3.0*  --   --  0.9 1.1  ALKPHOS 146*  --   --  85 86  ALT 68*  --   --  37 29  AST 27  --   --  20 22  GLUCOSE 695*   < > 175* 103* 110*   < > =  values in this interval not displayed.   Recent Labs  Lab 04/11/22 1526  INR 1.2      Imaging Studies: MR ABDOMEN MRCP W WO CONTAST  Result Date: 04/13/2022 CLINICAL DATA:  Nausea and vomiting EXAM: MRI ABDOMEN WITHOUT AND WITH CONTRAST (INCLUDING MRCP) TECHNIQUE: Multiplanar multisequence MR imaging of the abdomen was performed both before and after the administration of intravenous contrast. Heavily T2-weighted images of the biliary and pancreatic ducts were obtained, and three-dimensional MRCP images were rendered by post processing. CONTRAST:  1mL GADAVIST GADOBUTROL 1 MMOL/ML IV SOLN COMPARISON:  None Available. FINDINGS: Lower chest: No acute findings. Hepatobiliary: No mass or other parenchymal abnormality identified. Normal appearance of the gallbladder with no biliary ductal dilation. Pancreas: Mildly edematous pancreas. No peripancreatic fluid collection or gland hypoenhancement. No main pancreatic duct dilation. Spleen:  Within normal limits in size and appearance. Adrenals/Urinary Tract: No masses identified. No evidence of hydronephrosis. Stomach/Bowel: Visualized portions within the abdomen are unremarkable. Vascular/Lymphatic: No pathologically enlarged lymph nodes identified. No abdominal aortic aneurysm demonstrated. Other:  Trace abdominal ascites. Musculoskeletal: No suspicious bone lesions identified. IMPRESSION: 1. Mildly edematous pancreas, finding can be seen in the setting of acute pancreatitis. 2. No biliary ductal dilation or evidence of choledocholithiasis. 3. Trace abdominal free fluid, likely reactive. Electronically Signed   By: Yetta Glassman M.D.   On: 04/13/2022 18:39   MR 3D Recon At Scanner  Result Date: 04/13/2022 CLINICAL DATA:  Nausea and vomiting EXAM: MRI ABDOMEN WITHOUT AND WITH CONTRAST (INCLUDING MRCP) TECHNIQUE: Multiplanar multisequence MR imaging of the abdomen was performed both before and after the administration of intravenous contrast. Heavily  T2-weighted images of the biliary and pancreatic ducts were obtained, and three-dimensional MRCP images were rendered by post processing. CONTRAST:  19mL GADAVIST GADOBUTROL 1 MMOL/ML IV SOLN COMPARISON:  None Available. FINDINGS: Lower chest: No acute findings. Hepatobiliary: No mass or other parenchymal abnormality identified. Normal appearance of the gallbladder with no biliary ductal dilation. Pancreas: Mildly edematous pancreas. No peripancreatic fluid collection or gland hypoenhancement. No main pancreatic duct dilation. Spleen:  Within normal limits in size and appearance. Adrenals/Urinary Tract: No masses identified. No evidence of hydronephrosis. Stomach/Bowel: Visualized portions within the abdomen are unremarkable. Vascular/Lymphatic: No pathologically enlarged lymph nodes identified. No abdominal aortic aneurysm demonstrated. Other:  Trace abdominal ascites. Musculoskeletal: No suspicious bone lesions identified. IMPRESSION: 1. Mildly edematous pancreas, finding can be seen in the setting of acute pancreatitis. 2. No biliary ductal dilation or evidence of choledocholithiasis. 3. Trace abdominal free fluid, likely reactive. Electronically Signed   By: Yetta Glassman M.D.   On: 04/13/2022 18:39    Assessment:   # Acute on chronic pancreatitis - ana, igg4 negative previously - spink1, prss1 are pending - triglycerides- fasting in the 200s - MRI MRCP reviewed, no ductal disruption or biliary issues - no new meds/changes in dosing  # abdominal pain, n/v  # DM1 with DKA - a1c >10 - has had issues acquiring medications  # Acid reflux  # hypothyroidism  Plan:  Advance diet as tolerated; low fat MRI MRCP and labs reviewed with patient Can consider adding agent for triglycerides although only in the 200-300 range fasting.  - gemfibrozil 600mg  bid Will hold off on starting creon as this is quite expensive, will see in office and see if we can get patient assistance program Discussed  vape/tobacco/etoh cessation/avoidance No need to trend lipase amylase, clinically follow patient for improvement as these do not mark worsening severity or improvement of disease LFTs wnl Aggressive IVF resuscitation with Lactated Ringers Recommend tramadol at most with these patients to prevent risk of developing an opioid addiction PPI continue No endoscopic evaluation planned at this time   Supportive care  including ivf, electrolyte management, anti emetics, pain control and DM1 treatment as per primary team  GI to sign off. Available as needed. Will need outpatient followup. Provided office phone number for contact and lab follow up  I personally performed the service.  Management of other medical comorbidities as per primary team  Thank you for allowing [place] to participate in this patient's care. Please don't hesitate to call if any questions or concerns arise.   Annamaria Helling, DO [facility]  Portions of the record may have been created with voice recognition software. Occasional wrong-word or 'sound-a-like' substitutions may have occurred due to the inherent limitations of voice recognition software.  Read the chart carefully and recognize, using context, where substitutions may have occurred.

## 2022-04-14 NOTE — Plan of Care (Signed)
  Problem: Pain Managment: Goal: General experience of comfort will improve Outcome: Progressing   Problem: Safety: Goal: Ability to remain free from injury will improve Outcome: Progressing   Problem: Skin Integrity: Goal: Risk for impaired skin integrity will decrease Outcome: Progressing   Problem: Nutritional: Goal: Maintenance of adequate nutrition will improve Outcome: Progressing   Problem: Education: Goal: Ability to describe self-care measures that may prevent or decrease complications (Diabetes Survival Skills Education) will improve Outcome: Progressing

## 2022-04-14 NOTE — Progress Notes (Signed)
Mobility Specialist - Progress Note    04/14/22 0901  Mobility  Activity Ambulated independently in hallway;Stood at bedside;Dangled on edge of bed  Level of Assistance Independent  Assistive Device Other (Comment) (IV Pole)  Distance Ambulated (ft) 120 ft  Activity Response Tolerated well  $Mobility charge 1 Mobility   Pt supine in bed on RA upon arrival. Pt STS and ambulates indep. Pt complains of light dizziness and SOB at end. RN notified. PT returns to bed with needs in reach.   Terrilyn Saver  Mobility Specialist  04/14/22 9:04 AM

## 2022-04-15 ENCOUNTER — Encounter: Payer: Self-pay | Admitting: Family Medicine

## 2022-04-15 ENCOUNTER — Ambulatory Visit (INDEPENDENT_AMBULATORY_CARE_PROVIDER_SITE_OTHER): Payer: BC Managed Care – PPO | Admitting: Family Medicine

## 2022-04-15 VITALS — BP 119/82 | HR 115 | Ht 66.0 in | Wt 162.6 lb

## 2022-04-15 DIAGNOSIS — E1065 Type 1 diabetes mellitus with hyperglycemia: Secondary | ICD-10-CM

## 2022-04-15 DIAGNOSIS — N179 Acute kidney failure, unspecified: Secondary | ICD-10-CM | POA: Diagnosis not present

## 2022-04-15 DIAGNOSIS — K858 Other acute pancreatitis without necrosis or infection: Secondary | ICD-10-CM | POA: Diagnosis not present

## 2022-04-15 NOTE — Assessment & Plan Note (Signed)
He was unable to pick up the Humalog 75/25, so he went back to the Humalog (carb counting) and Levemir (25 units daily). Discussed titrating up by 2 units every 3-4 days if fasting glucose >130. Educated on reasoning why it was recommended he split dose into BID dosing for better basal coverage, but he is not interested in changing until he can get in with an endocrinologist. Gave him phone number of previous referral and re-entered as urgent to hopefully get him seen soon. Would also like to get him set up with our pharmacist in the meantime for closer med management, assistance, etc while waiting to get in with endocrinology (forwarding note to her). Also gave phone number of previous neuro referral for neuropathy management. He was just given 3-day supply of Tramadol yesterday, so I cannot write anymore at this time. Encouraged him to try the Lyrica he was discharged with.  Freestyle Libre sample given since he cannot currently afford co-pay for continuous monitor - will see if pharmacist can help in any way with this as well.  Encouraged close follow-up - do not want him going more than 3-4 weeks without being seen by either myself, pharmacist, or endocrinologist so that we can have accountability and close monitoring of glucose levels and  Insulin administration.  Patient agreeable to plan.  Patient aware of signs/symptoms requiring further/urgent evaluation.

## 2022-04-15 NOTE — Patient Instructions (Addendum)
Referral was sent to [facility] Phone: [phone]) after [facility] reported they were not accepting new patients. I will try to order again as urgent, please call their office if you don't hear anything in the next week.   [facility] has tried calling you multiple times for neuropathy follow-up. Reach out to them to set up appointment as it appears referral is still active. [phone]

## 2022-04-15 NOTE — Progress Notes (Signed)
Established Patient Office Visit  Subjective   Patient ID: David Ramsey, male    DOB: 03-09-1996  Age: 26 y.o. MRN: [id number]  CC: DM1, hospital follow-up   HPI Patient is here for hospital follow-up.   [facility]: Admission: 04/11/22 Discharged home: 04/14/22  Patient presented to the ED at [facility] on 04/11/22 with nausea and vomiting, abdominal pain, tachycardia reporting he ran out of insulin about 3 days prior (recently lost job, financial struggles). History significant for type 1 diabetes, DKA, Hashimoto's. Patient met sepsis criteria in the ED and had the following results: Platelets 533 Na 132 K 6.8 Cr 1.7 Alk phos 146 Total bili 3.0 Lipase 096 Metabolic acidosis EKG = sinus tach with peaked T waves in V2, V3 CT head negative US/CT Abd negative COVID negative He was treated for acute on chronic pancreatitis with IV fluids, IV PPI and for sepsis with IV ABX. He was found to be in DKA with no indication of underlying cause (pH 7.129, HCO3 11). He was admitted to step-down unit and started on insulin drip and IV fluids. On 04/12/22 he was transitioned off of the insulin trip and transferred to [facility] unit. GI was consulted as amylase/lipase were rising (1120, 852) and he was made NPO. GI suggested supportive management including IV fluids, electrolyte management, antiemetics, pain control, and DM management. Testing for ANA, IgG4 negative; they sent off chromosomal studies as well. MRI MRCP showed mild pancreatic edema, but normal ducts/biliary tree. GI to follow outpatient.  DM coordinator saw him while he was hospitalized and ran his benefits. It also appears that in-patient care manager sent his information to financial counselor for assistance with medicaid. Per Fortunato Curling, CPhT benefit check, copays would be: Humalog 75/25 - $15 Levemir last filled 7/29 (no copay provided) Lyumjev $15,  Humalog $165 (co-pay assistance is available) - they recommended considering  switching his Levemir and Humalog to Humalog 75/25 20 units BID  Med changes at discharge: - Humalog 75/25 20 units BID - Protonix 40 mg daily - Lyrica 75 mg daily - Tramadol 50 mg BID PRN (3 day supply) - Continued: levothyroxine 175 mcg daily, PRN Zofran 4 mg q8h, fluoxetine 40 mg daily, PRN albuterol inhaler - Discontinued: gabapentin, Humalog, Norco, Levemir   Today: - Patient reports he continues with chronic pain (neuropathy, chronic pancreatitis). He received the tramadol from discharge, but is asking for more. Advised that I cannot write more at this time given script was just written yesterday. Gave him phone number of previous Neuro referral that he missed calls from so they can start managing neuropathic pain. He has not yet picked up the Lyrica - encouraged him to try that as well.  - States he is still feeling weak, dizzy, fatigued, but gradually improving since leaving hospital yesterday.  - Reports that he went to pick up prescriptions yesterday and pharmacy was out of Humalog 75/25, so he got a refill of the previous Humalog (2 pens of 200 units) and has started using that along with Levemir, which he reports he already had some left at home. States he has not always followed hospitalist d/c recommendations because he feels more comfortable continuing with what his prior endocrinologist told him years ago. He typically does 25-30 units of Humalog during the day (based on carb counting) and 25 units of Levemir once daily, although sometimes he forgets (did not take today). Reports he has not had an insulin pump since 2013, but he would like to try again  once he gets in with endocrinology if he can afford it. (Reports they are under a lot of financial stress currently and are having trouble with copays and med costs). - He has not been able to afford continuous glucose monitor, so has been manually checking glucose occasionally. - Freestyle Libre sample provided today   Lab Results   Component Value Date   WBC 5.0 04/14/2022   HGB 11.6 (L) 04/14/2022   HCT 34.5 (L) 04/14/2022   MCV 89.4 04/14/2022   PLT 332 40/98/1191   Last metabolic panel Lab Results  Component Value Date   GLUCOSE 110 (H) 04/14/2022   NA 142 04/14/2022   K 3.6 04/14/2022   CL 107 04/14/2022   CO2 30 04/14/2022   BUN 5 (L) 04/14/2022   CREATININE 0.78 04/14/2022   GFRNONAA >60 04/14/2022   CALCIUM 9.1 04/14/2022   PHOS 4.2 03/20/2022   PROT 6.0 (L) 04/14/2022   ALBUMIN 3.0 (L) 04/14/2022   BILITOT 1.1 04/14/2022   ALKPHOS 86 04/14/2022   AST 22 04/14/2022   ALT 29 04/14/2022   ANIONGAP 5 04/14/2022        ROS All review of systems negative except what is listed in the HPI    Objective:     BP 119/82   Pulse (!) 115   Ht 5' 6"  (1.676 m)   Wt 162 lb 9.6 oz (73.8 kg)   BMI 26.24 kg/m    Physical Exam Vitals reviewed.  Constitutional:      Appearance: Normal appearance.  HENT:     Head: Normocephalic and atraumatic.  Cardiovascular:     Rate and Rhythm: Normal rate and regular rhythm.  Pulmonary:     Effort: Pulmonary effort is normal.     Breath sounds: Normal breath sounds.  Musculoskeletal:     Right lower leg: No edema.     Left lower leg: No edema.  Skin:    General: Skin is warm and dry.  Neurological:     General: No focal deficit present.     Mental Status: He is alert and oriented to person, place, and time. Mental status is at baseline.  Psychiatric:        Mood and Affect: Mood normal.        Behavior: Behavior normal.        Thought Content: Thought content normal.        Judgment: Judgment normal.      No results found for any visits on 04/15/22.    The ASCVD Risk score (Arnett DK, et al., 2019) failed to calculate for the following reasons:   The 2019 ASCVD risk score is only valid for ages 55 to 6    Assessment & Plan:   Problem List Items Addressed This Visit       Digestive   Acute pancreatitis    No longer trending labs  per GI recommendation. He improved with IV fluids, antiemetics, pain control, electrolyte management. GI planning to follow outpatient.         Endocrine   Uncontrolled type 1 diabetes mellitus with hyperglycemia, with long-term current use of insulin ([place]) - Primary    He was unable to pick up the Humalog 75/25, so he went back to the Humalog (carb counting) and Levemir (25 units daily). Discussed titrating up by 2 units every 3-4 days if fasting glucose >130. Educated on reasoning why it was recommended he split dose into BID dosing for better basal coverage, but he is  not interested in changing until he can get in with an endocrinologist. Gave him phone number of previous referral and re-entered as urgent to hopefully get him seen soon. Would also like to get him set up with our pharmacist in the meantime for closer med management, assistance, etc while waiting to get in with endocrinology (forwarding note to her). Also gave phone number of previous neuro referral for neuropathy management. He was just given 3-day supply of Tramadol yesterday, so I cannot write anymore at this time. Encouraged him to try the Lyrica he was discharged with.  Freestyle Libre sample given since he cannot currently afford co-pay for continuous monitor - will see if pharmacist can help in any way with this as well.  Encouraged close follow-up - do not want him going more than 3-4 weeks without being seen by either myself, pharmacist, or endocrinologist so that we can have accountability and close monitoring of glucose levels and  Insulin administration.  Patient agreeable to plan.  Patient aware of signs/symptoms requiring further/urgent evaluation.        Relevant Medications   insulin lispro (HUMALOG KWIKPEN) 200 UNIT/ML KwikPen   Other Relevant Orders   Ambulatory referral to Endocrinology     Genitourinary   AKI (acute kidney injury) ([place])    Labs stable yesterday. No need to repeat today. Continue adequate  hydration.        Return in about 4 weeks (around 05/13/2022) for DM1 follow-up if not able to get in with endo by then.    I spent 60 minutes dedicated to the care of this patient on the date of this encounter to include pre-visit chart review of prior notes and results, face-to-face time with the patient performing a medically appropriate exam, counseling/education regarding type 1 DM, neuropathy, insulin, monitoring, etc, and post-visit documentation and ordering of referrals as indicated.    Terrilyn Saver, NP

## 2022-04-15 NOTE — Discharge Summary (Addendum)
Physician Discharge Summary   Patient: David Ramsey MRN: [id number] DOB: [birth year]  Admit date:     04/11/2022  Discharge date: 04/14/2022  Discharge Physician: Max Sane   PCP: Terrilyn Saver, NP   Recommendations at discharge:   Follow-up with outpatient providers as requested  Discharge Diagnoses: Principal Problem:   Nausea & vomiting Active Problems:   Abdominal pain   DKA, type 1 (HCC)   High anion gap metabolic acidosis   Hyperkalemia   AKI (acute kidney injury) ([place])   Acute pancreatitis   Elevated LFTs   Essential hypertension   Hypothyroidism   Anxiety and depression  Hospital Course: 26 year old male admitted for DKA  8/6: Transitioned off insulin drip.  Transfer to [place] 8/7: Episode of hypoglycemia this morning.  GI consult as he is amylase and lipase are going up  Assessment and Plan: * Nausea, vomiting & Abdominal pain Nausea and vomiting attributed to DKA and likely acute on chronic pancreatitis. Now resolved and tolerating diet.  DKA, type 1 ([place]) Likely due to non-compliance/financial reasons? - treated with Insulin drip and transitioned to SQ. 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  He also reports that last time there was some medication confusion about where his prescriptions were sent.  And he could not get his insulin. Hemoglobin A1c 10 suggestive of poor control His insulin regimen was changed to Humalog 75/25 per DM nurse recommendation for ease. Patient was agreeable.  High anion gap metabolic acidosis Due to combination of DKA, acute kidney injury, lactic acidosis. Now resolved with treatment of DKA.  Hyperkalemia Resolved   AKI (acute kidney injury) ([place]) Resolved with hydration.  Essential hypertension Controlled  Elevated LFTs Improved with hydration.  Acute on chronic pancreatitis GI  recommend to hold off trending lipase, amylase as patient had clinical improvement and tolerating diet.  Hypothyroidism Cont levothyroxine at 175 mcg.  Normal TSH.  Anxiety and depression Cont with fluoxetine   Diabetic Neuropathy He requested to switch from Gabapentin to Lyrica as other wasn't working per him  Narcotics Abuse?Chronic Pain He wasn't agreeable to leave without stronger pain meds. After long d/w him, I finally had to agree to give 3 days worth of Tramadol (I almost felt he's not agreeable to leave) to transition him out of the Hospital. May benefit from pain mgmt f/up or have opioid contract as when it came to taking this meds - finances didn't seem to be an issue. I tried to convince him non-opioid but he won't agree.  He remains at very high risk for readmissions. - non-compliance/Opioid Abuse?     Consultants: GI Disposition: Home Diet recommendation:  Discharge Diet Orders (From admission, onward)     Start     Ordered   04/14/22 0000  Diet - low sodium heart healthy        04/14/22 1625           Carb modified diet DISCHARGE MEDICATION: Allergies as of 04/14/2022       Reactions   Tape Rash   Blisters- can use kerlix tape        Medication List     STOP taking these medications    gabapentin 400 MG capsule Commonly known as: Neurontin   HumaLOG KwikPen 200 UNIT/ML KwikPen Generic drug: insulin lispro   HYDROcodone-acetaminophen 5-325 MG tablet Commonly known  as: NORCO/VICODIN   Levemir FlexTouch 100 UNIT/ML FlexPen Generic drug: insulin detemir       TAKE these medications    albuterol 108 (90 Base) MCG/ACT inhaler Commonly known as: VENTOLIN HFA Inhale 2 puffs into the lungs every 6 (six) hours as needed for wheezing or shortness of breath.   blood glucose meter kit and supplies Kit Dispense based on patient and insurance preference. Use up to four times daily as directed.   FLUoxetine 40 MG capsule Commonly known as:  PROZAC Take 1 capsule (40 mg total) by mouth daily.   glucose blood test strip Use as instructed   levothyroxine 175 MCG tablet Commonly known as: SYNTHROID Take 1 tablet (175 mcg total) by mouth daily.   ondansetron 4 MG disintegrating tablet Commonly known as: ZOFRAN-ODT Take 1 tablet (4 mg total) by mouth every 8 (eight) hours as needed for nausea or vomiting.   pantoprazole 40 MG tablet Commonly known as: PROTONIX Take 1 tablet (40 mg total) by mouth daily.   pregabalin 75 MG capsule Commonly known as: LYRICA Take 1 capsule (75 mg total) by mouth daily.   traMADol 50 MG tablet Commonly known as: Ultram Take 1 tablet (50 mg total) by mouth every 12 (twelve) hours as needed for up to 3 days. What changed: when to take this        Follow-up Information     Terrilyn Saver, NP. Schedule an appointment as soon as possible for a visit in 2 day(s).   Specialties: Family Medicine, Emergency Medicine Why: [facility] Discharge F/UP Contact information: [address] [place] [address] [phone]         Annamaria Helling, DO. Schedule an appointment as soon as possible for a visit in 1 week(s).   Specialty: Gastroenterology Why: [facility] Discharge F/UP Contact information: [facility] [place] [address] [phone]                Discharge Exam: [name] Weights   04/11/22 1714  Weight: 42.73 kg   26 year old male lying in the bed comfortably without any acute distress Lungs clear to auscultation bilaterally Cardiovascular regular rate and rhythm Abdomen soft, benign Neuro alert and oriented, nonfocal Psych normal mood and affect  Condition at discharge: fair  The results of significant diagnostics from this hospitalization (including imaging, microbiology, ancillary and laboratory) are listed below for reference.   Imaging Studies: MR ABDOMEN MRCP W WO CONTAST  Result Date:  04/13/2022 CLINICAL DATA:  Nausea and vomiting EXAM: MRI ABDOMEN WITHOUT AND WITH CONTRAST (INCLUDING MRCP) TECHNIQUE: Multiplanar multisequence MR imaging of the abdomen was performed both before and after the administration of intravenous contrast. Heavily T2-weighted images of the biliary and pancreatic ducts were obtained, and three-dimensional MRCP images were rendered by post processing. CONTRAST:  50m GADAVIST GADOBUTROL 1 MMOL/ML IV SOLN COMPARISON:  None Available. FINDINGS: Lower chest: No acute findings. Hepatobiliary: No mass or other parenchymal abnormality identified. Normal appearance of the gallbladder with no biliary ductal dilation. Pancreas: Mildly edematous pancreas. No peripancreatic fluid collection or gland hypoenhancement. No main pancreatic duct dilation. Spleen:  Within normal limits in size and appearance. Adrenals/Urinary Tract: No masses identified. No evidence of hydronephrosis. Stomach/Bowel: Visualized portions within the abdomen are unremarkable. Vascular/Lymphatic: No pathologically enlarged lymph nodes identified. No abdominal aortic aneurysm demonstrated. Other:  Trace abdominal ascites. Musculoskeletal: No suspicious bone lesions identified. IMPRESSION: 1. Mildly edematous pancreas, finding can be seen in the setting of  acute pancreatitis. 2. No biliary ductal dilation or evidence of choledocholithiasis. 3. Trace abdominal free fluid, likely reactive. Electronically Signed   By: Yetta Glassman M.D.   On: 04/13/2022 18:39   MR 3D Recon At Scanner  Result Date: 04/13/2022 CLINICAL DATA:  Nausea and vomiting EXAM: MRI ABDOMEN WITHOUT AND WITH CONTRAST (INCLUDING MRCP) TECHNIQUE: Multiplanar multisequence MR imaging of the abdomen was performed both before and after the administration of intravenous contrast. Heavily T2-weighted images of the biliary and pancreatic ducts were obtained, and three-dimensional MRCP images were rendered by post processing. CONTRAST:  61m GADAVIST  GADOBUTROL 1 MMOL/ML IV SOLN COMPARISON:  None Available. FINDINGS: Lower chest: No acute findings. Hepatobiliary: No mass or other parenchymal abnormality identified. Normal appearance of the gallbladder with no biliary ductal dilation. Pancreas: Mildly edematous pancreas. No peripancreatic fluid collection or gland hypoenhancement. No main pancreatic duct dilation. Spleen:  Within normal limits in size and appearance. Adrenals/Urinary Tract: No masses identified. No evidence of hydronephrosis. Stomach/Bowel: Visualized portions within the abdomen are unremarkable. Vascular/Lymphatic: No pathologically enlarged lymph nodes identified. No abdominal aortic aneurysm demonstrated. Other:  Trace abdominal ascites. Musculoskeletal: No suspicious bone lesions identified. IMPRESSION: 1. Mildly edematous pancreas, finding can be seen in the setting of acute pancreatitis. 2. No biliary ductal dilation or evidence of choledocholithiasis. 3. Trace abdominal free fluid, likely reactive. Electronically Signed   By: LYetta GlassmanM.D.   On: 04/13/2022 18:39   CT HEAD WO CONTRAST (5MM)  Result Date: 04/11/2022 CLINICAL DATA:  Mental status change, unknown cause EXAM: CT HEAD WITHOUT CONTRAST TECHNIQUE: Contiguous axial images were obtained from the base of the skull through the vertex without intravenous contrast. RADIATION DOSE REDUCTION: This exam was performed according to the departmental dose-optimization program which includes automated exposure control, adjustment of the mA and/or kV according to patient size and/or use of iterative reconstruction technique. COMPARISON:  None Available. FINDINGS: Brain: No acute intracranial abnormality. Specifically, no hemorrhage, hydrocephalus, mass lesion, acute infarction, or significant intracranial injury. Vascular: No hyperdense vessel or unexpected calcification. Skull: No acute calvarial abnormality. Sinuses/Orbits: No acute findings Other: None IMPRESSION: Normal study.  Electronically Signed   By: KRolm BaptiseM.D.   On: 04/11/2022 17:46   CT ABDOMEN PELVIS W CONTRAST  Result Date: 04/11/2022 CLINICAL DATA:  Abdominal pain, acute, nonlocalized EXAM: CT ABDOMEN AND PELVIS WITH CONTRAST TECHNIQUE: Multidetector CT imaging of the abdomen and pelvis was performed using the standard protocol following bolus administration of intravenous contrast. RADIATION DOSE REDUCTION: This exam was performed according to the departmental dose-optimization program which includes automated exposure control, adjustment of the mA and/or kV according to patient size and/or use of iterative reconstruction technique. CONTRAST:  1064mOMNIPAQUE IOHEXOL 300 MG/ML  SOLN COMPARISON:  03/21/2022 FINDINGS: Lower chest: Linear scarring in the right lung base. No effusions. No acute abnormality. Hepatobiliary: No focal hepatic abnormality. Gallbladder unremarkable. Pancreas: Calcifications in the pancreatic head and uncinate process compatible with chronic pancreatitis. No ductal dilatation or focal abnormality otherwise. Spleen: No focal abnormality.  Normal size. Adrenals/Urinary Tract: No adrenal abnormality. No focal renal abnormality. No stones or hydronephrosis. Urinary bladder is unremarkable. Stomach/Bowel: Normal appendix. Stomach, large and small bowel grossly unremarkable. Vascular/Lymphatic: No evidence of aneurysm or adenopathy. Reproductive: No visible focal abnormality. Other: No free fluid or free air. Musculoskeletal: No acute bony abnormality. IMPRESSION: No acute findings in the abdomen or pelvis. Right basilar scarring. Changes of chronic pancreatitis, stable. Electronically Signed   By: KeRolm Baptise.D.   On:  04/11/2022 17:45   US ABDOMEN LIMITED RUQ (LIVER/GB)  Result Date: 04/11/2022 CLINICAL DATA:  Abdominal pain, acute nonlocalized. Elevated bilirubin level. EXAM: ULTRASOUND ABDOMEN LIMITED RIGHT UPPER QUADRANT COMPARISON:  Abdominopelvic CT 03/21/2022. Abdominal ultrasound  01/20/2022. FINDINGS: Gallbladder: No gallstones or wall thickening visualized. No sonographic Murphy sign noted by sonographer. Common bile duct: Diameter: 2 mm Liver: No focal lesion identified. Within normal limits in parenchymal echogenicity. Portal vein is patent on color Doppler imaging with normal direction of blood flow towards the liver. Other: None. IMPRESSION: Unchanged right upper quadrant abdominal ultrasound. No biliary dilatation. Electronically Signed   By: Richardean Sale M.D.   On: 04/11/2022 17:29   DG Chest Port 1 View  Result Date: 04/11/2022 CLINICAL DATA:  Questionable sepsis - evaluate for abnormality EXAM: PORTABLE CHEST 1 VIEW COMPARISON:  March 20, 2022 FINDINGS: The cardiomediastinal silhouette is normal in contour. No pleural effusion. No pneumothorax. No acute pleuroparenchymal abnormality. Visualized abdomen is unremarkable. No acute osseous abnormality noted. IMPRESSION: No acute cardiopulmonary abnormality. Electronically Signed   By: Valentino Saxon M.D.   On: 04/11/2022 16:01   CT ABDOMEN PELVIS W CONTRAST  Result Date: 03/21/2022 CLINICAL DATA:  Abdominal pain. EXAM: CT ABDOMEN AND PELVIS WITH CONTRAST TECHNIQUE: Multidetector CT imaging of the abdomen and pelvis was performed using the standard protocol following bolus administration of intravenous contrast. RADIATION DOSE REDUCTION: This exam was performed according to the departmental dose-optimization program which includes automated exposure control, adjustment of the mA and/or kV according to patient size and/or use of iterative reconstruction technique. CONTRAST:  175m OMNIPAQUE IOHEXOL 300 MG/ML  SOLN COMPARISON:  CT of the pelvis dated 01/20/2022. FINDINGS: Lower chest: Minimal bibasilar atelectasis. No intra-abdominal free air or free fluid. Hepatobiliary: No focal liver abnormality is seen. No gallstones, gallbladder wall thickening, or biliary dilatation. Pancreas: Areas of calcification involving the  uncinate process of the pancreas as well as mild atrophy of the distal clavicle sequela of chronic pancreatitis. No active inflammatory changes. Spleen: Normal in size without focal abnormality. Adrenals/Urinary Tract: The adrenal glands are unremarkable. Mild renal parenchyma atrophy and areas of cortical scarring primarily involving the right kidney, likely sequela of chronic pyelonephritis. There is no hydronephrosis on either side. The visualized ureters and urinary bladder appear unremarkable. Stomach/Bowel: There is moderate stool throughout the colon. No bowel obstruction or active inflammation. The appendix is normal. Vascular/Lymphatic: The abdominal aorta and IVC are unremarkable. No portal venous gas. There is no adenopathy. Reproductive: The prostate and seminal vesicles are grossly unremarkable. No pelvic mass. Other: None Musculoskeletal: No acute or significant osseous findings. IMPRESSION: 1. No acute intra-abdominal or pelvic pathology. 2. Moderate colonic stool burden. No bowel obstruction. Normal appendix. Electronically Signed   By: AAnner CreteM.D.   On: 03/21/2022 02:09   DG Chest Portable 1 View  Result Date: 03/20/2022 CLINICAL DATA:  Shortness of breath EXAM: PORTABLE CHEST 1 VIEW COMPARISON:  02/23/2022 FINDINGS: Heart and mediastinal contours are within normal limits. No focal opacities or effusions. No acute bony abnormality. IMPRESSION: No active disease. Electronically Signed   By: KRolm BaptiseM.D.   On: 03/20/2022 17:19    Microbiology: Results for orders placed or performed during the hospital encounter of 04/11/22  Blood Culture (routine x 2)     Status: None (Preliminary result)   Collection Time: 04/11/22  3:26 PM   Specimen: BLOOD  Result Value Ref Range Status   Specimen Description BLOOD RIGHT HAND  Final   Special Requests  Final    BOTTLES DRAWN AEROBIC AND ANAEROBIC Blood Culture results may not be optimal due to an inadequate volume of blood received in  culture bottles   Culture   Final    NO GROWTH 4 DAYS Performed at [facility], [address] [place], [place] [address]    Report Status PENDING  Incomplete  Blood Culture (routine x 2)     Status: None (Preliminary result)   Collection Time: 04/11/22  3:26 PM   Specimen: BLOOD  Result Value Ref Range Status   Specimen Description BLOOD RIGHT AC  Final   Special Requests   Final    BOTTLES DRAWN AEROBIC AND ANAEROBIC Blood Culture results may not be optimal due to an inadequate volume of blood received in culture bottles   Culture   Final    NO GROWTH 4 DAYS Performed at [facility], [address], [place], [place] [address]    Report Status PENDING  Incomplete  SARS Coronavirus 2 by RT PCR (hospital order, performed in [facility] hospital lab) *cepheid single result test* Anterior Nasal Swab     Status: None   Collection Time: 04/11/22  6:28 PM   Specimen: Anterior Nasal Swab  Result Value Ref Range Status   SARS Coronavirus 2 by RT PCR NEGATIVE NEGATIVE Final    Comment: (NOTE) SARS-CoV-2 target nucleic acids are NOT DETECTED.  The SARS-CoV-2 RNA is generally detectable in upper and lower respiratory specimens during the acute phase of infection. The lowest concentration of SARS-CoV-2 viral copies this assay can detect is 250 copies / mL. A negative result does not preclude SARS-CoV-2 infection and should not be used as the sole basis for treatment or other patient management decisions.  A negative result may occur with improper specimen collection / handling, submission of specimen other than nasopharyngeal swab, presence of viral mutation(s) within the areas targeted by this assay, and inadequate number of viral copies (<250 copies / mL). A negative result must be combined with clinical observations, patient history, and epidemiological information.  Fact Sheet for Patients:   https://www.patel.info/  Fact Sheet for Healthcare  Providers: https://hall.com/  This test is not yet approved or  cleared by the [place] FDA and has been authorized for detection and/or diagnosis of SARS-CoV-2 by FDA under an Emergency Use Authorization (EUA).  This EUA will remain in effect (meaning this test can be used) for the duration of the COVID-19 declaration under Section 564(b)(1) of the Act, 21 U.S.C. section 360bbb-3(b)(1), unless the authorization is terminated or revoked sooner.  Performed at [facility], [address] [place], [place] [address]   MRSA Next Gen by PCR, Nasal     Status: None   Collection Time: 04/11/22  9:26 PM   Specimen: Nasal Mucosa; Nasal Swab  Result Value Ref Range Status   MRSA by PCR Next Gen NOT DETECTED NOT DETECTED Final    Comment: (NOTE) The GeneXpert MRSA Assay (FDA approved for NASAL specimens only), is one component of a comprehensive MRSA colonization surveillance program. It is not intended to diagnose MRSA infection nor to guide or monitor treatment for MRSA infections. Test performance is not FDA approved in patients less than 52 years old. Performed at [facility], [address] [place], [place] [address]     Labs: CBC: Recent Labs  Lab 04/11/22 1456 04/13/22 0436 04/14/22 0530  WBC 18.9* 8.5 5.0  HGB 14.4 11.5* 11.6*  HCT 45.0 34.0* 34.5*  MCV 93.8  90.4 89.4  PLT 533* 355 672   Basic Metabolic Panel: Recent Labs  Lab 04/11/22 1526 04/11/22 1831 04/11/22 2315 04/12/22 0335 04/12/22 0724 04/13/22 0436 04/14/22 0530  NA  --    < > 140 142 141 145 142  K  --    < > 4.9 4.3 3.9 3.2* 3.6  CL  --    < > 109 111 111 113* 107  CO2  --    < > 12* 18* 21* 25 30  GLUCOSE  --    < > 255* 175* 175* 103* 110*  BUN  --    < > 30* 24* 19 8 5*  CREATININE  --    < > 1.36* 1.26* 1.23 0.85 0.78  CALCIUM  --    < > 9.6 9.3 9.3 8.9 9.1  MG 2.5*  --   --   --   --  2.1  --    < > = values in this interval not displayed.    Liver Function Tests: Recent Labs  Lab 04/11/22 1456 04/13/22 0436 04/14/22 0530  AST _0 ALT 68* 37 29  ALKPHOS 146* 85 86  BILITOT 3.0* 0.9 1.1  PROT 7.9 5.8* 6.0*  ALBUMIN 4.1 2.9* 3.0*   CBG: Recent Labs  Lab 04/14/22 0407 04/14/22 0741 04/14/22 1229 04/14/22 1359 04/14/22 1558  GLUCAP 107* 119* 451* 435* 250*    Discharge time spent: greater than 30 minutes.  Signed: Max Sane, MD [facility] 04/15/2022

## 2022-04-15 NOTE — Assessment & Plan Note (Signed)
Labs stable yesterday. No need to repeat today. Continue adequate hydration.

## 2022-04-15 NOTE — Assessment & Plan Note (Signed)
No longer trending labs per GI recommendation. He improved with IV fluids, antiemetics, pain control, electrolyte management. GI planning to follow outpatient.

## 2022-04-16 ENCOUNTER — Encounter: Payer: Self-pay | Admitting: Family Medicine

## 2022-04-16 LAB — CULTURE, BLOOD (ROUTINE X 2)
Culture: NO GROWTH
Culture: NO GROWTH

## 2022-04-17 ENCOUNTER — Ambulatory Visit: Payer: BC Managed Care – PPO | Admitting: Pharmacist

## 2022-04-17 DIAGNOSIS — E1065 Type 1 diabetes mellitus with hyperglycemia: Secondary | ICD-10-CM

## 2022-04-17 NOTE — Chronic Care Management (AMB) (Signed)
Care Management   Pharmacy Note  04/17/2022 Name: David Ramsey MRN: [id number] DOB: 25-Dec-1995  Subjective: David Ramsey is a 26 y.o. year old male who is a primary care patient of Terrilyn Saver, NP. The Care Management team was consulted for assistance with care management and care coordination needs.    I was unable to reach patient today by phone - 2 attempts.   I did research his pharmacy benefits. Looks like he is covered by [employer] plan thru [employer].  Per pharmacy plan he has the follow up copay structure.     Type / Tier of drug Cost for 30 days at retail pharmacy  (max of 3 fills, then must use mail order or [place])  Cost for 90 days thru mail order.   Generic $15 $25  Brand $50 $130  Non formulary $70 $180   The following insulins are covered:  Long acting insulin - levemir, Semglee, Toujeo and Tresiba Short acting insulin - Humalog and Lyumjev Mixed insulin - Humalog Mix 75/25  Objective:  Lab Results  Component Value Date   CREATININE 0.78 04/14/2022   CREATININE 0.85 04/13/2022   CREATININE 1.23 04/12/2022    Lab Results  Component Value Date   HGBA1C 10.0 (H) 03/21/2022       Component Value Date/Time   CHOL 205 (H) 01/20/2022 2143   TRIG 270 (H) 04/14/2022 0530   HDL 43 01/20/2022 2143   CHOLHDL 4.8 01/20/2022 2143   VLDL 51 (H) 01/20/2022 2143   LDLCALC 111 (H) 01/20/2022 2143     BP Readings from Last 3 Encounters:  04/15/22 119/82  04/14/22 (!) 136/91  03/22/22 (!) 139/100    Allergies  Allergen Reactions   Tape Rash    Blisters- can use kerlix tape    Medications Reviewed Today     Reviewed by Terrilyn Saver, NP (Nurse Practitioner) on 04/15/22 at [facility] List Status: <None>   Medication Order Taking? Sig Documenting Provider Last Dose Status Informant  albuterol (VENTOLIN HFA) 108 (90 Base) MCG/ACT inhaler [id number]  Inhale 2 puffs into the lungs every 6 (six) hours as needed for wheezing or  shortness of breath. Loletha Grayer, MD  Active Self  blood glucose meter kit and supplies KIT [id number]  Dispense based on patient and insurance preference. Use up to four times daily as directed. Sheikh, [place], [place]  Active Self  FLUoxetine (PROZAC) 40 MG capsule [id number]  Take 1 capsule (40 mg total) by mouth daily. Annita Brod, MD  Active Self  glucose blood test strip [id number]  Use as instructed Wyvonnia Dusky, MD  Active Self  insulin lispro (HUMALOG KWIKPEN) 200 UNIT/ML KwikPen [id number] Yes Inject into the skin. [provider]  Active   levothyroxine (SYNTHROID) 175 MCG tablet [id number]  Take 1 tablet (175 mcg total) by mouth daily. Annita Brod, MD  Active Self  ondansetron (ZOFRAN-ODT) 4 MG disintegrating tablet [id number]  Take 1 tablet (4 mg total) by mouth every 8 (eight) hours as needed for nausea or vomiting. Harvest Dark, MD  Active Self  pantoprazole (PROTONIX) 40 MG tablet [id number]  Take 1 tablet (40 mg total) by mouth daily. Max Sane, MD  Active   pregabalin (LYRICA) 75 MG capsule [id number]  Take 1 capsule (75 mg total) by mouth daily. Max Sane, MD  Active   traMADol (ULTRAM) 50 MG tablet [id number]  Take 1 tablet (50 mg total) by mouth every 12 (twelve) hours  as needed for up to 3 days. Max Sane, MD  Active             Patient Active Problem List   Diagnosis Date Noted   DKA, type 1 ([place]) 04/11/2022   Nausea & vomiting 04/11/2022   High anion gap metabolic acidosis 09/29/9357   Essential hypertension 03/21/2022   Chronic pancreatitis ([place]) 03/21/2022   Overweight (BMI 25.0-29.9) 03/21/2022   Anxiety and depression 03/16/2022   Elevated LFTs 11/30/2021   Abdominal pain 11/30/2021   Hyperkalemia 11/30/2021   Elevated d-dimer 11/07/2021   Diabetic neuropathy ([place]) 11/06/2021   Acute pancreatitis 11/04/2021   Uncontrolled type 1 diabetes mellitus with hyperglycemia, with long-term current use of insulin ([place]) 02/17/2017    AKI (acute kidney injury) ([place]) 12/16/2016   Hypothyroidism 11/01/2014    Conditions to be addressed/monitored: type 1 diabetes  Medication Assistance:   none at this time but will assess need for manufacturer coupon cards.   Plan: Will continue to try to outreach patient to assist with Continuous Glucose Monitor, provided education regarding DM and assist with medication adherence / cost.  Cherre Robins, PharmD Clinical Pharmacist [facility]

## 2022-04-21 ENCOUNTER — Telehealth: Payer: BC Managed Care – PPO

## 2022-04-21 ENCOUNTER — Telehealth: Payer: Self-pay | Admitting: Pharmacist

## 2022-04-21 NOTE — Telephone Encounter (Signed)
Second attempt to follow up with patient regarding blood glucose readings after supplying patient with 14 day samples of Libre 3 sensor last week. Also planned to assist with insulin therapy cost.  Unable to reach patient. LM on VM with my contact number [phone] and main office number [phone].   Will continue to try to outreach patient.

## 2022-04-27 LAB — MISC LABCORP TEST (SEND OUT): Labcorp test code: 252794

## 2022-05-25 ENCOUNTER — Other Ambulatory Visit: Payer: Self-pay | Admitting: Family Medicine

## 2022-05-25 DIAGNOSIS — E1042 Type 1 diabetes mellitus with diabetic polyneuropathy: Secondary | ICD-10-CM

## 2022-06-15 ENCOUNTER — Ambulatory Visit: Payer: Self-pay | Admitting: Psychiatry

## 2022-06-29 ENCOUNTER — Other Ambulatory Visit: Payer: Self-pay | Admitting: Family Medicine

## 2022-06-29 DIAGNOSIS — E1042 Type 1 diabetes mellitus with diabetic polyneuropathy: Secondary | ICD-10-CM

## 2022-07-02 ENCOUNTER — Ambulatory Visit: Payer: Self-pay | Admitting: Psychiatry

## 2022-07-21 ENCOUNTER — Ambulatory Visit: Payer: BC Managed Care – PPO | Admitting: Family Medicine

## 2022-08-18 ENCOUNTER — Emergency Department
Admission: EM | Admit: 2022-08-18 | Discharge: 2022-08-18 | Disposition: A | Discharge status: Home / Self Care | Payer: BC Managed Care – PPO | Attending: Emergency Medicine | Admitting: Emergency Medicine

## 2022-08-18 ENCOUNTER — Other Ambulatory Visit: Payer: Self-pay

## 2022-08-18 ENCOUNTER — Emergency Department: Payer: BC Managed Care – PPO

## 2022-08-18 DIAGNOSIS — S62364A Nondisplaced fracture of neck of fourth metacarpal bone, right hand, initial encounter for closed fracture: Secondary | ICD-10-CM | POA: Diagnosis not present

## 2022-08-18 DIAGNOSIS — S6991XA Unspecified injury of right wrist, hand and finger(s), initial encounter: Secondary | ICD-10-CM | POA: Diagnosis not present

## 2022-08-18 DIAGNOSIS — W228XXA Striking against or struck by other objects, initial encounter: Secondary | ICD-10-CM | POA: Diagnosis not present

## 2022-08-18 DIAGNOSIS — S62369A Nondisplaced fracture of neck of unspecified metacarpal bone, initial encounter for closed fracture: Secondary | ICD-10-CM

## 2022-08-18 DIAGNOSIS — S62334A Displaced fracture of neck of fourth metacarpal bone, right hand, initial encounter for closed fracture: Secondary | ICD-10-CM | POA: Diagnosis not present

## 2022-08-18 DIAGNOSIS — S62366A Nondisplaced fracture of neck of fifth metacarpal bone, right hand, initial encounter for closed fracture: Secondary | ICD-10-CM | POA: Diagnosis not present

## 2022-08-18 DIAGNOSIS — K2900 Acute gastritis without bleeding: Secondary | ICD-10-CM | POA: Diagnosis not present

## 2022-08-18 DIAGNOSIS — S62336A Displaced fracture of neck of fifth metacarpal bone, right hand, initial encounter for closed fracture: Secondary | ICD-10-CM | POA: Diagnosis not present

## 2022-08-18 LAB — COMPREHENSIVE METABOLIC PANEL
ALT: 20 U/L (ref 0–44)
AST: 23 U/L (ref 15–41)
Albumin: 4.3 g/dL (ref 3.5–5.0)
Alkaline Phosphatase: 65 U/L (ref 38–126)
Anion gap: 10 (ref 5–15)
BUN: 14 mg/dL (ref 6–20)
CO2: 27 mmol/L (ref 22–32)
Calcium: 9.5 mg/dL (ref 8.9–10.3)
Chloride: 103 mmol/L (ref 98–111)
Creatinine, Ser: 1.05 mg/dL (ref 0.61–1.24)
GFR, Estimated: >60 mL/min (ref >60)
Glucose, Bld: 145 mg/dL — ABNORMAL HIGH (ref 70–99)
Potassium: 3.5 mmol/L (ref 3.5–5.1)
Sodium: 140 mmol/L (ref 135–145)
Total Bilirubin: 1.5 mg/dL — ABNORMAL HIGH (ref 0.3–1.2)
Total Protein: 7.6 g/dL (ref 6.5–8.1)

## 2022-08-18 LAB — CBC
HCT: 41.1 % (ref 39.0–52.0)
Hemoglobin: 14.1 g/dL (ref 13.0–17.0)
MCH: 29.6 pg (ref 26.0–34.0)
MCHC: 34.3 g/dL (ref 30.0–36.0)
MCV: 86.3 fL (ref 80.0–100.0)
Platelets: 377 10*3/uL (ref 150–400)
RBC: 4.76 MIL/uL (ref 4.22–5.81)
RDW: 11.9 % (ref 11.5–15.5)
WBC: 7.4 10*3/uL (ref 4.0–10.5)
nRBC: 0 % (ref 0.0–0.2)

## 2022-08-18 LAB — URINALYSIS, ROUTINE W REFLEX MICROSCOPIC
Bilirubin Urine: NEGATIVE
Glucose, UA: >=500 mg/dL — AB
Hgb urine dipstick: NEGATIVE
Ketones, ur: 5 mg/dL — AB
Leukocytes,Ua: NEGATIVE
Nitrite: NEGATIVE
Protein, ur: NEGATIVE mg/dL
Specific Gravity, Urine: 1.015 (ref 1.005–1.030)
pH: 5 (ref 5.0–8.0)

## 2022-08-18 LAB — LIPASE, BLOOD: Lipase: 39 U/L (ref 11–51)

## 2022-08-18 MED ORDER — NAPROXEN 500 MG PO TABS: 500.0000 mg | ORAL_TABLET | Freq: Two times a day (BID) | ORAL | 2 refills | Status: DC

## 2022-08-18 MED ORDER — ONDANSETRON 4 MG PO TBDP: 4.0000 mg | ORAL_TABLET | Freq: Three times a day (TID) | ORAL | 0 refills | Status: DC | PRN

## 2022-08-18 MED ORDER — ONDANSETRON 4 MG PO TBDP
4.0000 mg | ORAL_TABLET | Freq: Once | ORAL | Status: AC | PRN
  Administered 2022-08-18: 4 mg via ORAL
  Filled 2022-08-18: qty 1
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
## 2022-08-24 ENCOUNTER — Telehealth (INDEPENDENT_AMBULATORY_CARE_PROVIDER_SITE_OTHER): Payer: BC Managed Care – PPO | Admitting: Family Medicine

## 2022-08-24 ENCOUNTER — Encounter: Payer: Self-pay | Admitting: Family Medicine

## 2022-08-24 DIAGNOSIS — R112 Nausea with vomiting, unspecified: Secondary | ICD-10-CM

## 2022-08-24 DIAGNOSIS — E1065 Type 1 diabetes mellitus with hyperglycemia: Secondary | ICD-10-CM | POA: Diagnosis not present

## 2022-08-24 DIAGNOSIS — E1042 Type 1 diabetes mellitus with diabetic polyneuropathy: Secondary | ICD-10-CM

## 2022-08-24 DIAGNOSIS — S62369A Nondisplaced fracture of neck of unspecified metacarpal bone, initial encounter for closed fracture: Secondary | ICD-10-CM

## 2022-08-24 DIAGNOSIS — E038 Other specified hypothyroidism: Secondary | ICD-10-CM

## 2022-08-24 DIAGNOSIS — E063 Autoimmune thyroiditis: Secondary | ICD-10-CM

## 2022-08-24 MED ORDER — PANTOPRAZOLE SODIUM 40 MG PO TBEC: 40.0000 mg | DELAYED_RELEASE_TABLET | Freq: Every day | ORAL | 0 refills | Status: DC

## 2022-08-24 MED ORDER — LEVEMIR FLEXTOUCH 100 UNIT/ML ~~LOC~~ SOPN: 15.0000 [IU] | PEN_INJECTOR | Freq: Two times a day (BID) | SUBCUTANEOUS | 3 refills | Status: DC

## 2022-08-24 MED ORDER — HUMALOG KWIKPEN 200 UNIT/ML ~~LOC~~ SOPN: PEN_INJECTOR | SUBCUTANEOUS | 2 refills | Status: DC

## 2022-08-24 MED ORDER — PREGABALIN 75 MG PO CAPS: 75.0000 mg | ORAL_CAPSULE | Freq: Every day | ORAL | 0 refills | Status: DC

## 2022-08-24 MED ORDER — LEVOTHYROXINE SODIUM 175 MCG PO TABS: 175.0000 ug | ORAL_TABLET | Freq: Every day | ORAL | 0 refills | Status: DC

## 2022-08-24 MED ORDER — GLUCOSE BLOOD VI STRP: ORAL_STRIP | 0 refills | Status: DC

## 2022-08-24 MED ORDER — ONDANSETRON 4 MG PO TBDP: 4.0000 mg | ORAL_TABLET | Freq: Three times a day (TID) | ORAL | 0 refills | Status: DC | PRN

## 2022-08-24 NOTE — Assessment & Plan Note (Signed)
Refill provided.  Please schedule in-person follow-up.

## 2022-08-24 NOTE — Assessment & Plan Note (Signed)
Refill provided.  Please schedule in-person follow-up. 
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
## 2022-08-24 NOTE — Assessment & Plan Note (Signed)
Refills provided 

## 2022-08-28 ENCOUNTER — Ambulatory Visit (INDEPENDENT_AMBULATORY_CARE_PROVIDER_SITE_OTHER): Payer: BC Managed Care – PPO

## 2022-08-28 ENCOUNTER — Encounter: Payer: Self-pay | Admitting: Orthopaedic Surgery

## 2022-08-28 ENCOUNTER — Ambulatory Visit (INDEPENDENT_AMBULATORY_CARE_PROVIDER_SITE_OTHER): Payer: BC Managed Care – PPO | Admitting: Orthopaedic Surgery

## 2022-08-28 DIAGNOSIS — S62336A Displaced fracture of neck of fifth metacarpal bone, right hand, initial encounter for closed fracture: Secondary | ICD-10-CM | POA: Diagnosis not present

## 2022-08-28 MED ORDER — TRAMADOL HCL 50 MG PO TABS: 50.0000 mg | ORAL_TABLET | Freq: Every day | ORAL | 0 refills | Status: DC | PRN
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
## 2022-09-10 ENCOUNTER — Other Ambulatory Visit: Payer: Self-pay | Admitting: Orthopaedic Surgery

## 2022-09-10 MED ORDER — TRAMADOL HCL 50 MG PO TABS: 50.0000 mg | ORAL_TABLET | Freq: Every day | ORAL | 0 refills | Status: DC | PRN

## 2022-09-29 ENCOUNTER — Ambulatory Visit: Payer: BC Managed Care – PPO | Admitting: Orthopaedic Surgery

## 2022-10-06 ENCOUNTER — Other Ambulatory Visit: Payer: Self-pay | Admitting: Physician Assistant

## 2022-10-06 ENCOUNTER — Telehealth: Payer: Self-pay | Admitting: Orthopaedic Surgery

## 2022-10-06 MED ORDER — TRAMADOL HCL 50 MG PO TABS: 50.0000 mg | ORAL_TABLET | Freq: Every day | ORAL | 0 refills | Status: DC | PRN

## 2022-10-06 NOTE — Telephone Encounter (Signed)
Pt called requesting refill of Tramadol. Please sen to [facility]. Pt phone number is [phone].

## 2022-10-06 NOTE — Telephone Encounter (Signed)
Called pt 1X and was unable to leave vm to set an appt with Dr Erlinda Hong. Pt sent message she need to reschedule. Pt has a vm that has not been set up

## 2022-10-06 NOTE — Telephone Encounter (Signed)
sent

## 2022-10-14 ENCOUNTER — Other Ambulatory Visit: Payer: Self-pay | Admitting: Physician Assistant

## 2022-10-20 ENCOUNTER — Ambulatory Visit (INDEPENDENT_AMBULATORY_CARE_PROVIDER_SITE_OTHER): Payer: BC Managed Care – PPO | Admitting: Physician Assistant

## 2022-10-20 ENCOUNTER — Encounter: Payer: Self-pay | Admitting: Orthopaedic Surgery

## 2022-10-20 ENCOUNTER — Ambulatory Visit (INDEPENDENT_AMBULATORY_CARE_PROVIDER_SITE_OTHER): Payer: BC Managed Care – PPO

## 2022-10-20 DIAGNOSIS — S62336A Displaced fracture of neck of fifth metacarpal bone, right hand, initial encounter for closed fracture: Secondary | ICD-10-CM

## 2022-10-20 MED ORDER — HYDROCODONE-ACETAMINOPHEN 5-325 MG PO TABS: 1.0000 | ORAL_TABLET | Freq: Two times a day (BID) | ORAL | 0 refills | Status: DC | PRN
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
## 2022-10-28 ENCOUNTER — Other Ambulatory Visit: Payer: Self-pay | Admitting: Physician Assistant

## 2022-10-28 ENCOUNTER — Telehealth: Payer: Self-pay | Admitting: Physician Assistant

## 2022-10-28 ENCOUNTER — Telehealth: Payer: Self-pay | Admitting: Orthopaedic Surgery

## 2022-10-28 MED ORDER — HYDROCODONE-ACETAMINOPHEN 5-325 MG PO TABS: 1.0000 | ORAL_TABLET | Freq: Two times a day (BID) | ORAL | 0 refills | Status: DC | PRN

## 2022-10-28 NOTE — Telephone Encounter (Signed)
Pt called for refill of hydrocodone. Please send to pharmacy on file. Pt phone number is [phone].

## 2022-10-28 NOTE — Telephone Encounter (Signed)
Pt called and states he [facility] [address] in [place] please. Pt phone number is [phone]

## 2022-10-28 NOTE — Telephone Encounter (Signed)
sent

## 2022-10-29 ENCOUNTER — Other Ambulatory Visit: Payer: Self-pay | Admitting: Physician Assistant

## 2022-10-29 MED ORDER — HYDROCODONE-ACETAMINOPHEN 5-325 MG PO TABS: 1.0000 | ORAL_TABLET | Freq: Two times a day (BID) | ORAL | 0 refills | Status: DC | PRN

## 2022-10-29 NOTE — Telephone Encounter (Signed)
sent

## 2022-11-03 ENCOUNTER — Ambulatory Visit (INDEPENDENT_AMBULATORY_CARE_PROVIDER_SITE_OTHER): Payer: BC Managed Care – PPO | Admitting: Orthopaedic Surgery

## 2022-11-03 ENCOUNTER — Ambulatory Visit (INDEPENDENT_AMBULATORY_CARE_PROVIDER_SITE_OTHER): Payer: BC Managed Care – PPO

## 2022-11-03 DIAGNOSIS — S62336A Displaced fracture of neck of fifth metacarpal bone, right hand, initial encounter for closed fracture: Secondary | ICD-10-CM

## 2022-11-03 MED ORDER — HYDROCODONE-ACETAMINOPHEN 5-325 MG PO TABS: 1.0000 | ORAL_TABLET | Freq: Every evening | ORAL | 0 refills | Status: DC | PRN

## 2022-11-03 MED ORDER — HYDROCODONE-ACETAMINOPHEN 5-325 MG PO TABS: 1.0000 | ORAL_TABLET | Freq: Two times a day (BID) | ORAL | 0 refills | Status: DC | PRN

## 2022-11-03 NOTE — Progress Notes (Signed)
Office Visit Note   Patient: David Ramsey           Date of Birth: 10-10-1995           MRN: [id number] Visit Date: 11/03/2022              Requested by: Terrilyn Saver, NP [address] [place],  [place] [address] PCP: Terrilyn Saver, NP   Assessment & Plan: Visit Diagnoses:  1. Closed displaced fracture of neck of fifth metacarpal bone of right hand, initial encounter     Plan: Impression is healing right fourth and fifth metacarpal neck fractures.  We will place him in a removable Velcro ulnar gutter splint.  Recheck in 4 weeks with three-view x-rays of the right hand.  Follow-Up Instructions: Return in about 4 weeks (around 12/01/2022).   Orders:  Orders Placed This Encounter  Procedures   XR Hand Complete Right   No orders of the defined types were placed in this encounter.     Procedures: No procedures performed   Clinical Data: No additional findings.   Subjective: Chief Complaint  Patient presents with   Right Hand - Follow-up    HPI  David Ramsey returns today for follow-up for right hand fractures.  Review of Systems   Objective: Vital Signs: There were no vitals taken for this visit.  Physical Exam  Ortho Exam  Examination right hand shows minimal swelling.  No bony tenderness to the fracture sites.  Specialty Comments:  No specialty comments available.  Imaging: No results found.   PMFS History: Patient Active Problem List   Diagnosis Date Noted   DKA, type 1 ([place]) 04/11/2022   Nausea & vomiting 04/11/2022   High anion gap metabolic acidosis 123456   Essential hypertension 03/21/2022   Chronic pancreatitis ([place]) 03/21/2022   Overweight (BMI 25.0-29.9) 03/21/2022   Anxiety and depression 03/16/2022   Elevated LFTs 11/30/2021   Abdominal pain 11/30/2021   Hyperkalemia 11/30/2021   Elevated d-dimer 11/07/2021   Diabetic neuropathy ([place]) 11/06/2021   Acute pancreatitis 11/04/2021   Uncontrolled type 1  diabetes mellitus with hyperglycemia, with long-term current use of insulin ([place]) 02/17/2017   AKI (acute kidney injury) ([place]) 12/16/2016   Hypothyroidism 11/01/2014   Past Medical History:  Diagnosis Date   AKI (acute kidney injury) ([place]) 12/16/2016   Diabetes mellitus without complication ([place])    INSULIN DEPENDENT   SINCE AGE 81   Diabetic polyneuropathy associated with type 1 diabetes mellitus ([place]) 11/30/2021   DKA (diabetic ketoacidoses) 02/17/2017   Hashimoto's disease    MRSA infection    Pneumonia    Liver contusion, lung effusion, sepsis multiorgan failure, no clear source    Family History  Problem Relation Age of Onset   Diabetes Cousin    Heart disease Other    Cancer Other    Thyroid disease Other    Healthy Mother     Past Surgical History:  Procedure Laterality Date   CHEST TUBE INSERTION     Social History   Occupational History   Not on file  Tobacco Use   Smoking status: Former    Packs/day: 0.50    Years: 4.00    Total pack years: 2.00    Types: Cigarettes    Quit date: 05/23/2017    Years since quitting: 5.4   Smokeless tobacco: Never  Vaping Use   Vaping Use: Every day   Substances: Nicotine, Flavoring  Substance and Sexual Activity  Alcohol use: Yes    Comment: occ   Drug use: No   Sexual activity: Yes

## 2022-11-06 IMAGING — CT CT ABD-PELV W/ CM
2 of 5 series · 15 of 46 positions shown, 17 images · IV contrast (APPLIED)
Comparison: CT of the abdomen and pelvis 11/04/2021.

CLINICAL DATA: 25-year-old male with history of abdominal pain.
Suspected pancreatitis.

EXAM:
CT ABDOMEN AND PELVIS WITH CONTRAST
TECHNIQUE: Multidetector CT imaging of the abdomen and pelvis was performed
using the standard protocol following bolus administration of
intravenous contrast.

[Series 2: abdomen 5.0 · axial · 0.69mm/px · z∈[-1004,-564]mm · 12 of 104 slices shown, 14 images]
[im 8/104  soft-tissue]
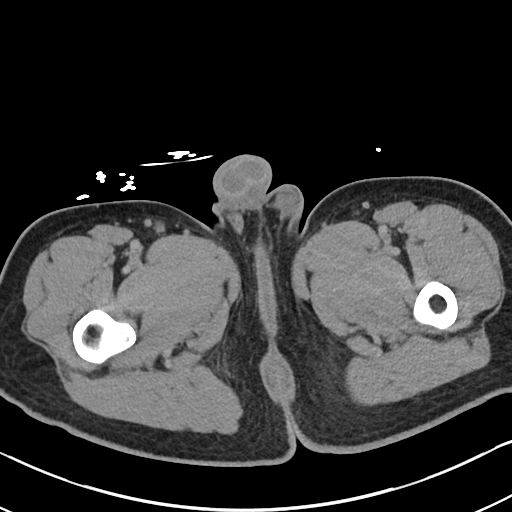
[im 8/104  bone]
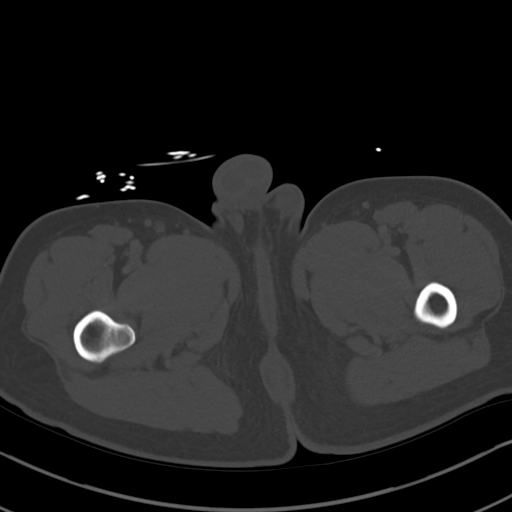
[im 15/104  soft-tissue]
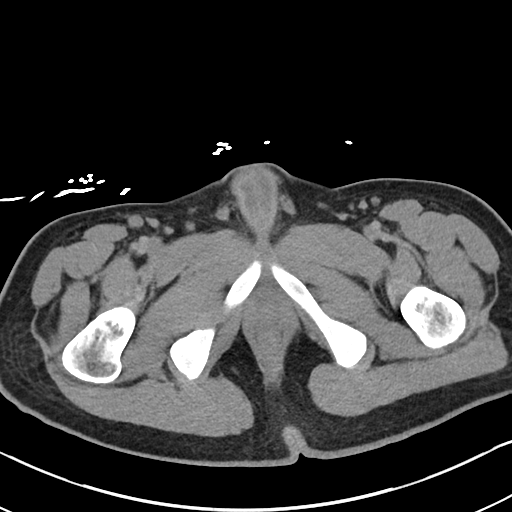
[im 23/104  soft-tissue]
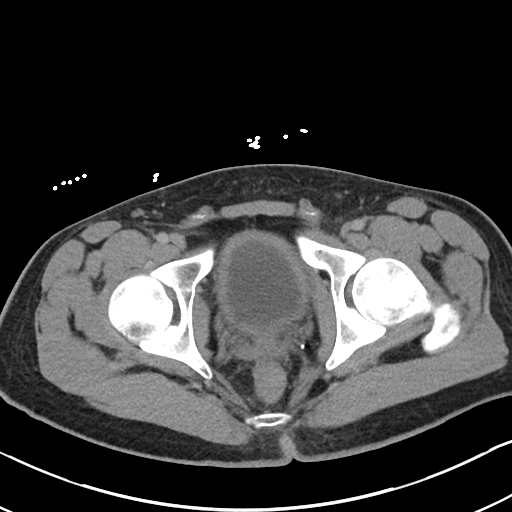
[im 30/104  soft-tissue]
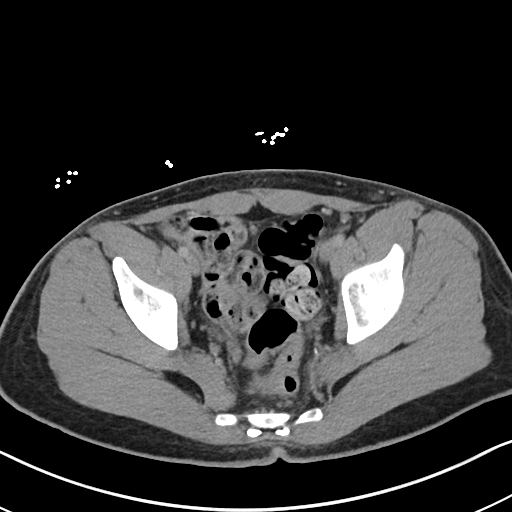
[im 37/104  soft-tissue]
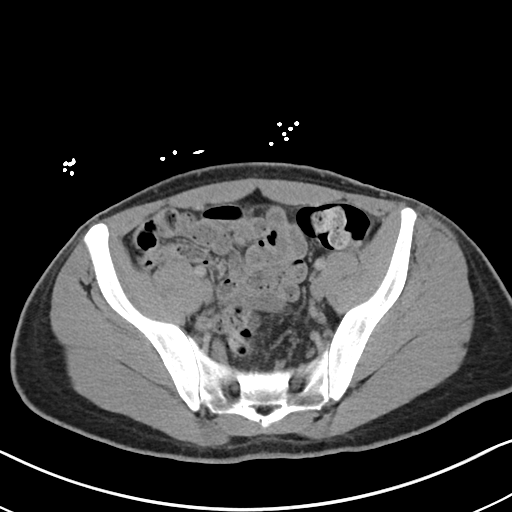
[im 45/104  soft-tissue]
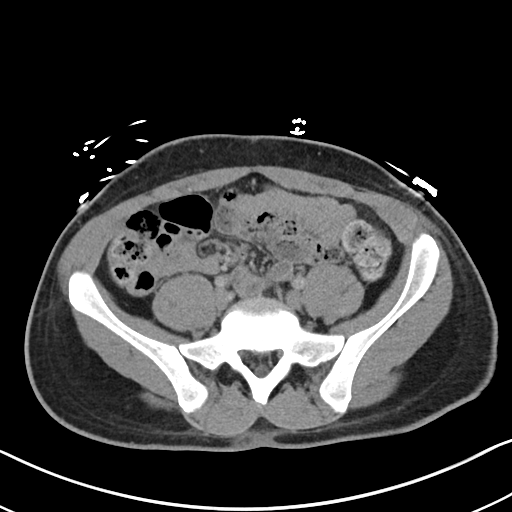
[im 59/104  soft-tissue]
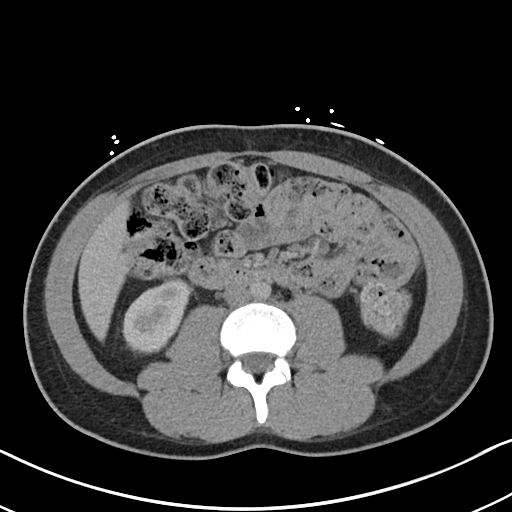
[im 67/104  soft-tissue]
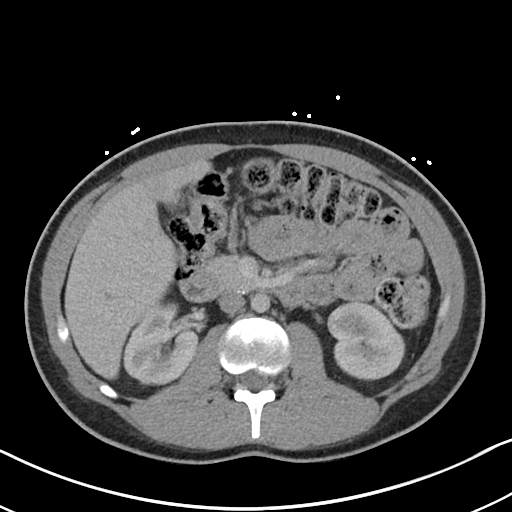
[im 74/104  soft-tissue]
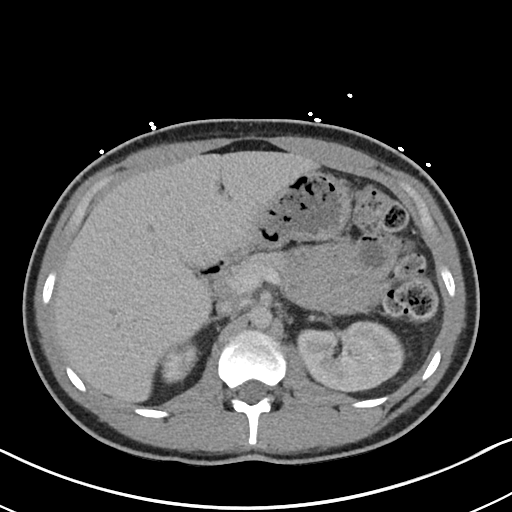
[im 74/104  bone]
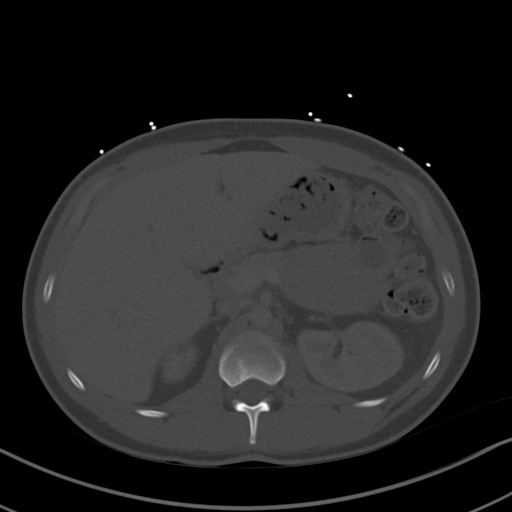
[im 81/104  soft-tissue]
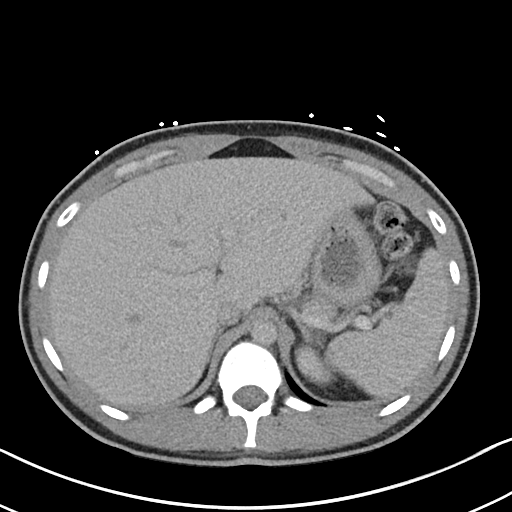
[im 89/104  soft-tissue]
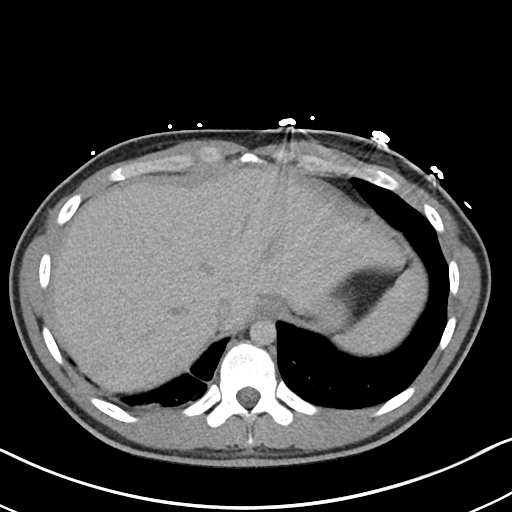
[im 96/104  soft-tissue]
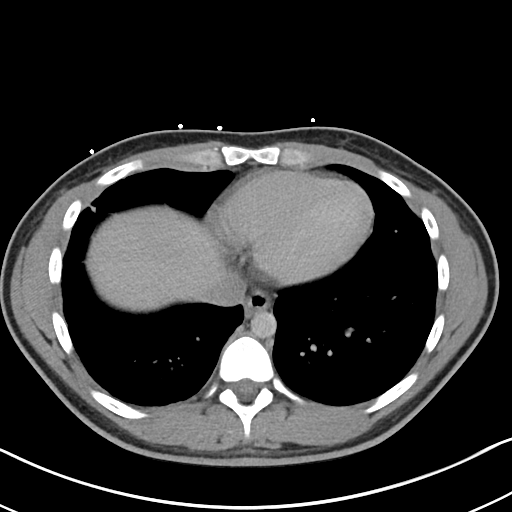

[Series 5: abdomen 3.0 mpr cor · coronal · 0.76mm/px · 3 of 82 slices shown]
[im 28/82  soft-tissue]
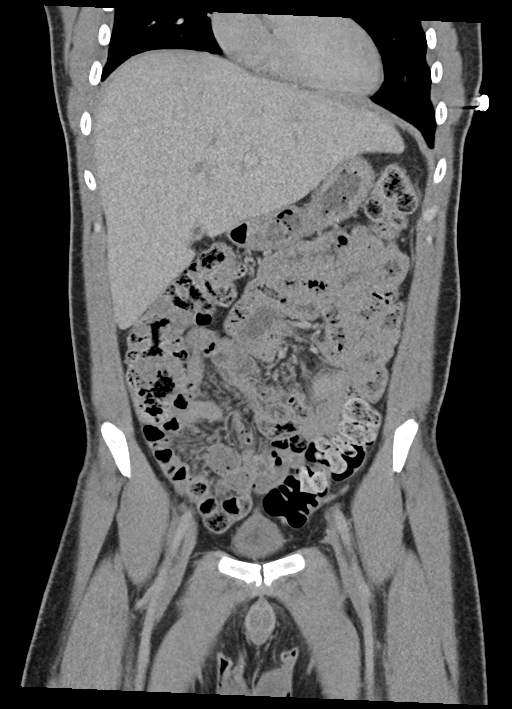
[im 37/82  soft-tissue]
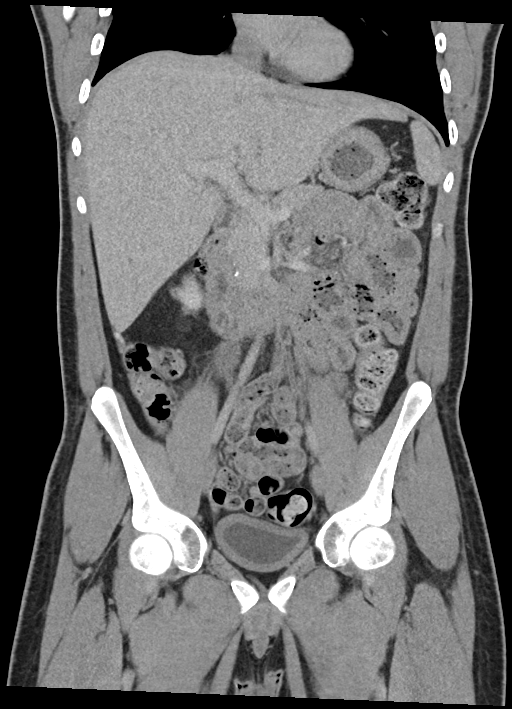
[im 46/82  soft-tissue]
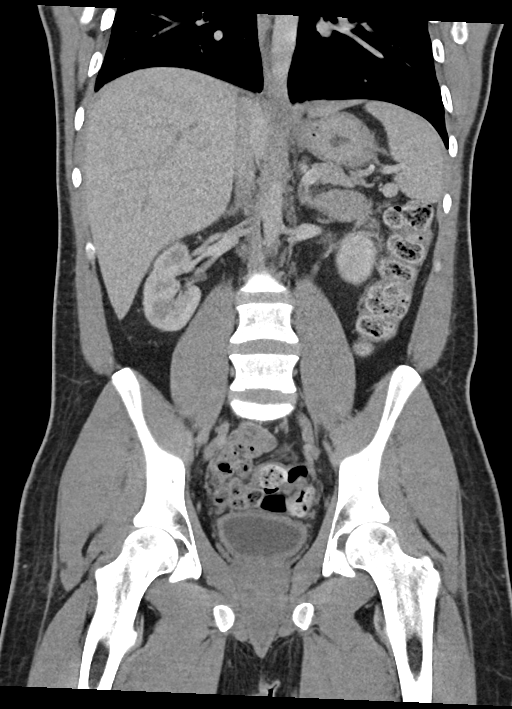

[15 of 46 positions shown; findings below may reference images not displayed]

RADIATION DOSE REDUCTION: This exam was performed according to the
departmental dose-optimization program which includes automated
exposure control, adjustment of the mA and/or kV according to
patient size and/or use of iterative reconstruction technique.

CONTRAST:  80mL OMNIPAQUE IOHEXOL 300 MG/ML  SOLN
FINDINGS: Lower chest: Mild chronic scarring in the periphery of the right
middle and lower lobes, similar to the prior study.

Hepatobiliary: No suspicious cystic or solid hepatic lesions.
Gallbladder is nearly completely decompressed, but otherwise
unremarkable in appearance.

Pancreas: Coarse calcifications are again noted in the head and
uncinate process of the pancreas, related to prior episodes of
pancreatitis. No definite peripancreatic fluid collections or
inflammatory changes are noted on today's examination. No pancreatic
mass. Pancreatic parenchyma enhances normally. No pancreatic ductal
dilatation.

Spleen: Unremarkable.

Adrenals/Urinary Tract: Mild multifocal cortical thinning in the
right kidney. Left kidney is normal in appearance. Bilateral adrenal
glands are normal in appearance. No hydroureteronephrosis. Urinary
bladder wall appears thickened diffusely.

Stomach/Bowel: The appearance of the stomach is normal. There is no
pathologic dilatation of small bowel or colon. Normal appendix.

Vascular/Lymphatic: Atherosclerotic calcifications are noted in the
pelvic vasculature. No aneurysm or dissection noted in the abdominal
or pelvic vasculature. No lymphadenopathy noted in the abdomen or
pelvis.

Reproductive: Prostate gland and seminal vesicles are unremarkable
in appearance.

Other: No significant volume of ascites.  No pneumoperitoneum.

Musculoskeletal: There are no aggressive appearing lytic or blastic
lesions noted in the visualized portions of the skeleton.
IMPRESSION: 1. Circumferential thickening of the urinary bladder, which could
suggest cystitis. Correlation with urinalysis is recommended.
2. Changes of chronic pancreatitis in the head and uncinate process
of the pancreas. No definite imaging findings to suggest acute
pancreatitis on today's CT examination.
3. Mild multifocal cortical thinning in the right kidney.
4. Atherosclerosis in the pelvic vasculature.

## 2022-11-06 IMAGING — DX DG CHEST 1V PORT
1 series · 1 of 1 positions shown · non-contrast
Comparison: 11/06/2021

CLINICAL DATA: Cough and fever

EXAM:
PORTABLE CHEST 1 VIEW

[chest ap]
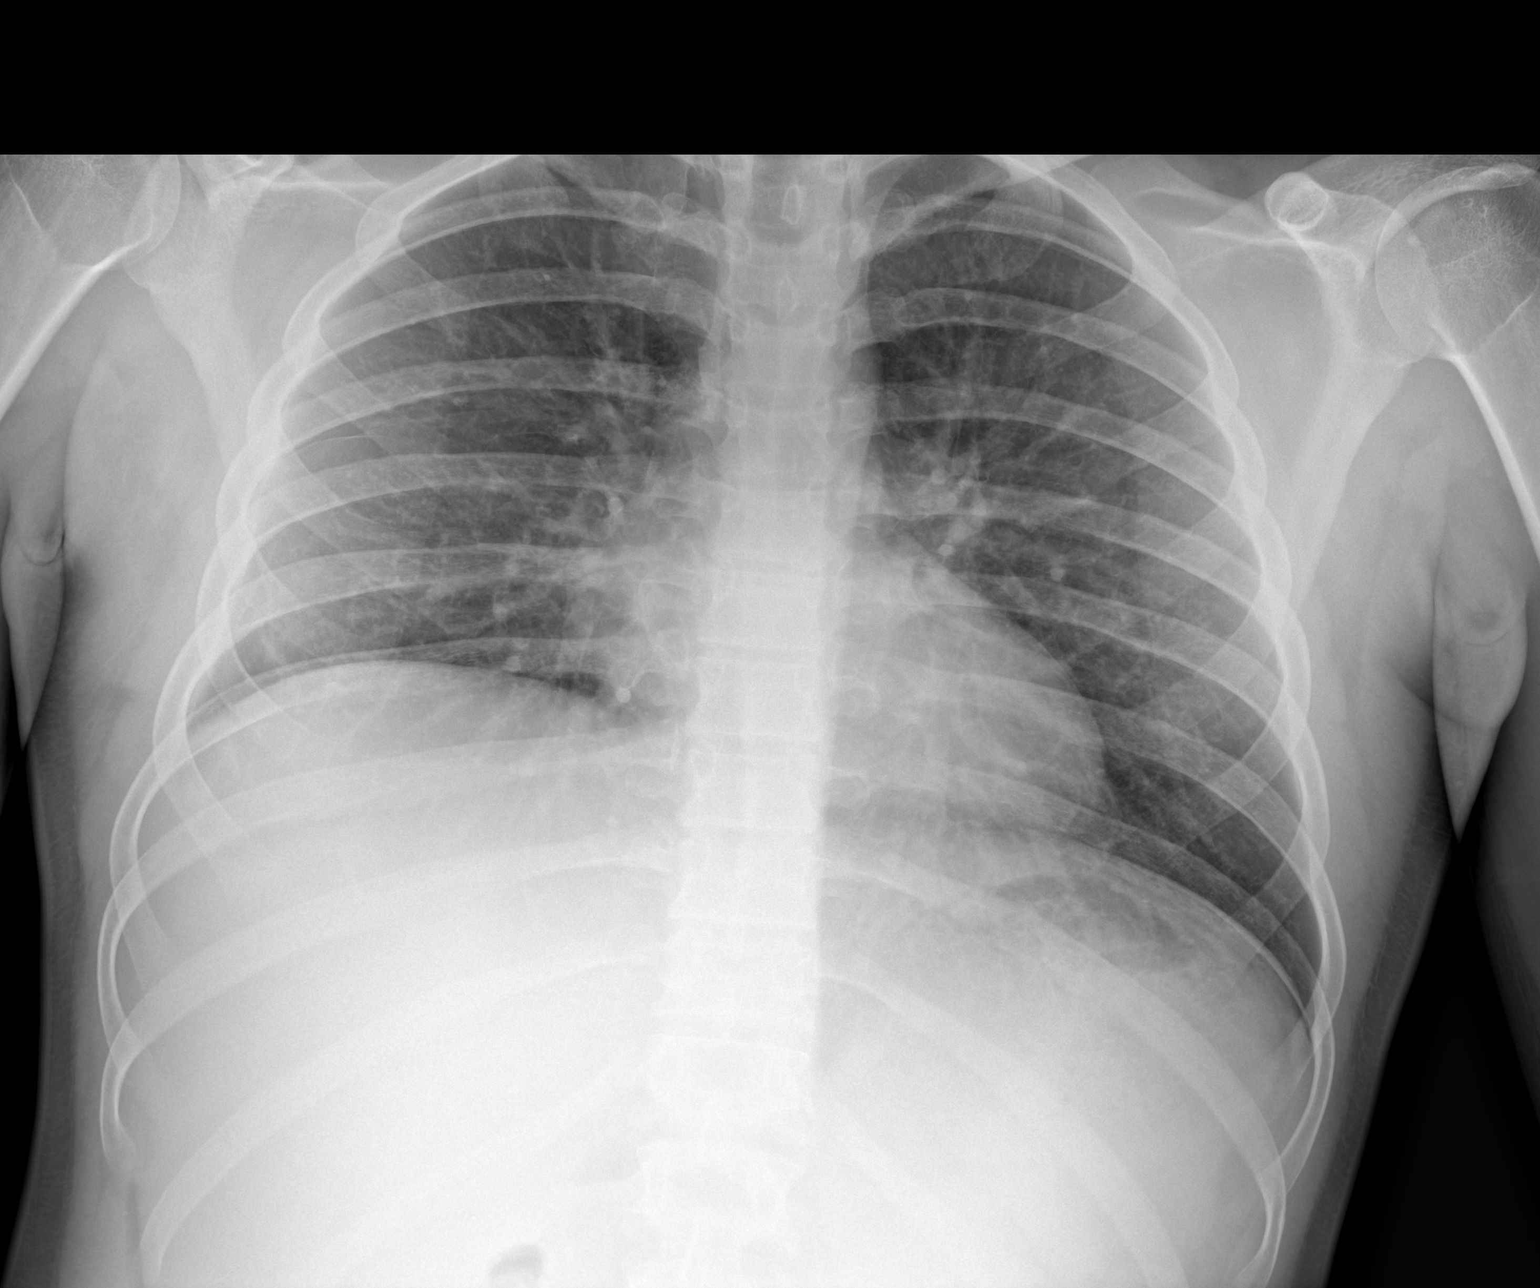

[1 of 1 positions shown; findings below may reference images not displayed]

FINDINGS: The heart size and mediastinal contours are within normal limits.
Both lungs are clear. The visualized skeletal structures are
unremarkable.
IMPRESSION: No active disease.

## 2022-11-20 ENCOUNTER — Other Ambulatory Visit: Payer: Self-pay | Admitting: Family Medicine

## 2022-11-20 DIAGNOSIS — E1065 Type 1 diabetes mellitus with hyperglycemia: Secondary | ICD-10-CM

## 2022-11-24 ENCOUNTER — Other Ambulatory Visit: Payer: Self-pay | Admitting: Family Medicine

## 2022-11-24 ENCOUNTER — Ambulatory Visit: Payer: BC Managed Care – PPO | Admitting: Orthopaedic Surgery

## 2022-11-24 DIAGNOSIS — E1042 Type 1 diabetes mellitus with diabetic polyneuropathy: Secondary | ICD-10-CM

## 2022-11-24 MED ORDER — PREGABALIN 75 MG PO CAPS: 75.0000 mg | ORAL_CAPSULE | Freq: Every day | ORAL | 0 refills | Status: DC

## 2022-11-24 NOTE — Telephone Encounter (Signed)
Requesting: Lyrica 75mg   Contract: None UDS: 04/11/22 (hospital) Last Visit: 08/24/22 Next Visit: None Last Refill: 08/24/22 #30 and 0RF   Please Advise

## 2022-12-15 ENCOUNTER — Ambulatory Visit: Payer: BC Managed Care – PPO | Admitting: Family Medicine

## 2022-12-15 DIAGNOSIS — Z91199 Patient's noncompliance with other medical treatment and regimen due to unspecified reason: Secondary | ICD-10-CM

## 2022-12-15 DIAGNOSIS — Z13220 Encounter for screening for lipoid disorders: Secondary | ICD-10-CM

## 2022-12-15 DIAGNOSIS — E038 Other specified hypothyroidism: Secondary | ICD-10-CM

## 2022-12-15 DIAGNOSIS — E1065 Type 1 diabetes mellitus with hyperglycemia: Secondary | ICD-10-CM

## 2022-12-16 ENCOUNTER — Other Ambulatory Visit: Payer: Self-pay | Admitting: Family Medicine

## 2022-12-16 ENCOUNTER — Other Ambulatory Visit: Payer: Self-pay | Admitting: Orthopaedic Surgery

## 2022-12-16 ENCOUNTER — Telehealth: Payer: Self-pay | Admitting: Orthopaedic Surgery

## 2022-12-16 ENCOUNTER — Other Ambulatory Visit: Payer: Self-pay | Admitting: Physician Assistant

## 2022-12-16 DIAGNOSIS — E1042 Type 1 diabetes mellitus with diabetic polyneuropathy: Secondary | ICD-10-CM

## 2022-12-16 DIAGNOSIS — R112 Nausea with vomiting, unspecified: Secondary | ICD-10-CM

## 2022-12-16 NOTE — Telephone Encounter (Signed)
Never came back for fu.  Cannot refill

## 2022-12-16 NOTE — Telephone Encounter (Signed)
Please advise on refills. Patient no showed appt yesterday.   Last appt December 09/04/2022  Protonix and Zofran sent for #30 in December with no refills   Lyrica - last written 11/24/2022 #30 no refills

## 2022-12-16 NOTE — Telephone Encounter (Signed)
Spoke with patient. He is scheduled to come back in on Friday.

## 2022-12-16 NOTE — Progress Notes (Signed)
   Complete physical exam  Patient: David Ramsey   DOB: 06/27/1998   27 y.o. Male  MRN: [id number]  Subjective:    No chief complaint on file.   David Ramsey is a 27 y.o. male who presents today for a complete physical exam. She reports consuming a {diet types:17450} diet. {types:19826} She generally feels {DESC; WELL/FAIRLY WELL/POORLY:18703}. She reports sleeping {DESC; WELL/FAIRLY WELL/POORLY:18703}. She {does/does not:200015} have additional problems to discuss today.    Most recent fall risk assessment:    03/04/2022   10:42 AM  Fall Risk   Falls in the past year? 0  Number falls in past yr: 0  Injury with Fall? 0  Risk for fall due to : No Fall Risks  Follow up Falls evaluation completed     Most recent depression screenings:    03/04/2022   10:42 AM 01/23/2021   10:46 AM  PHQ 2/9 Scores  PHQ - 2 Score 0 0  PHQ- 9 Score 5     {VISON DENTAL STD PSA (Optional):27386}  {History (Optional):23778}  Patient Care Team: Jessup, Joy, NP as PCP - General (Nurse Practitioner)   Outpatient Medications Prior to Visit  Medication Sig   fluticasone (FLONASE) 50 MCG/ACT nasal spray Place 2 sprays into both nostrils in the morning and at bedtime. After 7 days, reduce to once daily.   norgestimate-ethinyl estradiol (SPRINTEC 28) 0.25-35 MG-MCG tablet Take 1 tablet by mouth daily.   Nystatin POWD Apply liberally to affected area 2 times per day   spironolactone (ALDACTONE) 100 MG tablet Take 1 tablet (100 mg total) by mouth daily.   No facility-administered medications prior to visit.    ROS        Objective:     There were no vitals taken for this visit. {Vitals History (Optional):23777}  Physical Exam   No results found for any visits on 04/09/22. {Show previous labs (optional):23779}    Assessment & Plan:    Routine Health Maintenance and Physical Exam  Immunization History  Administered Date(s) Administered   DTaP 09/10/1999, 11/06/1999,  01/15/2000, 09/30/2000, 04/15/2004   Hepatitis A 02/10/2008, 02/15/2009   Hepatitis B 06/28/1999, 08/05/1999, 01/15/2000   HiB (PRP-OMP) 09/10/1999, 11/06/1999, 01/15/2000, 09/30/2000   IPV 09/10/1999, 11/06/1999, 07/05/2000, 04/15/2004   Influenza,inj,Quad PF,6+ Mos 05/18/2014   Influenza-Unspecified 08/17/2012   MMR 07/05/2001, 04/15/2004   Meningococcal Polysaccharide 02/15/2012   Pneumococcal Conjugate-13 09/30/2000   Pneumococcal-Unspecified 01/15/2000, 03/30/2000   Tdap 02/15/2012   Varicella 07/05/2000, 02/10/2008    Health Maintenance  Topic Date Due   HIV Screening  Never done   Hepatitis C Screening  Never done   INFLUENZA VACCINE  04/07/2022   PAP-Cervical Cytology Screening  04/09/2022 (Originally 06/26/2020)   PAP SMEAR-Modifier  04/09/2022 (Originally 06/26/2020)   TETANUS/TDAP  04/09/2022 (Originally 02/14/2022)   HPV VACCINES  Discontinued   COVID-19 Vaccine  Discontinued    Discussed health benefits of physical activity, and encouraged her to engage in regular exercise appropriate for her age and condition.  Problem List Items Addressed This Visit   None Visit Diagnoses     Annual physical exam    -  Primary   Cervical cancer screening       Need for Tdap vaccination          No follow-ups on file.     Joy Jessup, NP   

## 2022-12-16 NOTE — Telephone Encounter (Signed)
Patient called needing Rx refilled Hydrocodone. [facility] [address] [facility] [place].   The number to contact patient is [phone]

## 2022-12-16 NOTE — Telephone Encounter (Signed)
Has not come back for fu and cannot refill

## 2022-12-18 ENCOUNTER — Telehealth: Payer: Self-pay | Admitting: Orthopaedic Surgery

## 2022-12-18 ENCOUNTER — Ambulatory Visit (INDEPENDENT_AMBULATORY_CARE_PROVIDER_SITE_OTHER): Payer: BC Managed Care – PPO | Admitting: Physician Assistant

## 2022-12-18 ENCOUNTER — Encounter: Payer: Self-pay | Admitting: Orthopaedic Surgery

## 2022-12-18 ENCOUNTER — Other Ambulatory Visit (INDEPENDENT_AMBULATORY_CARE_PROVIDER_SITE_OTHER): Payer: BC Managed Care – PPO

## 2022-12-18 DIAGNOSIS — S62336A Displaced fracture of neck of fifth metacarpal bone, right hand, initial encounter for closed fracture: Secondary | ICD-10-CM

## 2022-12-18 MED ORDER — ACETAMINOPHEN-CODEINE 300-30 MG PO TABS: 1.0000 | ORAL_TABLET | Freq: Every evening | ORAL | 0 refills | Status: DC | PRN

## 2022-12-18 NOTE — Telephone Encounter (Signed)
Patient need Rx Tylenol #3 [facility] [address] on [address] no [facility] in [place] has the Medication, please advise

## 2022-12-18 NOTE — Progress Notes (Signed)
Office Visit Note   Patient: David Ramsey           Date of Birth: 1996-07-27           MRN: [id number] Visit Date: 12/18/2022              Requested by: [name], NP [address] [place],  [place] [address] PCP: [name], NP   Assessment & Plan: Visit Diagnoses:  1. Closed displaced fracture of neck of fifth metacarpal bone of right hand, initial encounter     Plan: Impression is healed fourth and fifth metacarpal neck fractures.  I believe the patient's symptoms are primarily coming from stiffness at this point.  I would like to start him in hand therapy for which she is agreeable to.  Referral has been made.  I sent in 1 prescription of Tylenol with codeine.  He will follow-up with [place] as needed.  Follow-Up Instructions: Return if symptoms worsen or fail to improve.   Orders:  Orders Placed This Encounter  Procedures   XR Hand Complete Right   Meds ordered this encounter  Medications   acetaminophen-codeine (TYLENOL #3) 300-30 MG tablet    Sig: Take 1 tablet by mouth at bedtime as needed for moderate pain.    Dispense:  30 tablet    Refill:  0      Procedures: No procedures performed   Clinical Data: No additional findings.   Subjective: Chief Complaint  Patient presents with   Right Hand - Follow-up    Right 4th and 5th metacarpal fracture    HPI patient is a pleasant 27 year old gentleman who comes in today for follow-up of his right fourth and fifth metacarpal neck fractures.  He is 8 weeks out from reinjury.  He has been wearing his ulnar gutter Velcro splint.  He notes pain just at night.  He still has some stiffness with range of motion.     Objective: Vital Signs: There were no vitals taken for this visit.    Ortho Exam right hand exam shows no swelling or ecchymosis.  Does have tenderness along the distal fourth and fifth metacarpals.  Slight limitation with extension and flexion of the fingers.  He is  neurovascular intact distally.  Specialty Comments:  No specialty comments available.  Imaging: XR Hand Complete Right  Result Date: 12/18/2022 X-rays demonstrate consolidation of the fourth and fifth metacarpal neck fractures with appropriate alignment    PMFS History: Patient Active Problem List   Diagnosis Date Noted   DKA, type 1 04/11/2022   Nausea & vomiting 04/11/2022   High anion gap metabolic acidosis 04/11/2022   Essential hypertension 03/21/2022   Chronic pancreatitis 03/21/2022   Overweight (BMI 25.0-29.9) 03/21/2022   Anxiety and depression 03/16/2022   Elevated LFTs 11/30/2021   Abdominal pain 11/30/2021   Hyperkalemia 11/30/2021   Elevated d-dimer 11/07/2021   Diabetic neuropathy 11/06/2021   Acute pancreatitis 11/04/2021   Uncontrolled type 1 diabetes mellitus with hyperglycemia, with long-term current use of insulin 02/17/2017   AKI (acute kidney injury) 12/16/2016   Hypothyroidism 11/01/2014   Non-compliance with treatment 07/02/2014   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   Long-term insulin use 03/03/2012   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 12/29/2011   Diabetic ketoacidosis without coma associated with type 1 diabetes mellitus 08/25/2011   Eczema 06/16/2011   DM (diabetes mellitus), type 1 06/16/2011   Hashimoto's thyroiditis 03/20/2010   ADD (  attention deficit disorder) 02/07/2009   Hypothyroidism (acquired) 07/02/2008   Type 1 diabetes mellitus with hyperglycemia 08/15/2001   History of varicella 06/08/1999   Past Medical History:  Diagnosis Date   AKI (acute kidney injury) 12/16/2016   Diabetes mellitus without complication    INSULIN DEPENDENT   SINCE AGE 46   Diabetic polyneuropathy associated with type 1 diabetes mellitus 11/30/2021   DKA (diabetic ketoacidoses) 02/17/2017   Hashimoto's disease    MRSA infection    Pneumonia    Liver contusion, lung effusion, sepsis multiorgan failure, no clear source     Family History  Problem Relation Age of Onset   Diabetes Cousin    Heart disease Other    Cancer Other    Thyroid disease Other    Healthy Mother     Past Surgical History:  Procedure Laterality Date   CHEST TUBE INSERTION     Social History   Occupational History   Not on file  Tobacco Use   Smoking status: Former    Packs/day: 0.50    Years: 4.00    Additional pack years: 0.00    Total pack years: 2.00    Types: Cigarettes    Quit date: 05/23/2017    Years since quitting: 5.5   Smokeless tobacco: Never  Vaping Use   Vaping Use: Every day   Substances: Nicotine, Flavoring  Substance and Sexual Activity   Alcohol use: Yes    Comment: occ   Drug use: No   Sexual activity: Yes

## 2022-12-20 ENCOUNTER — Other Ambulatory Visit: Payer: Self-pay | Admitting: Physician Assistant

## 2022-12-20 MED ORDER — ACETAMINOPHEN-CODEINE 300-30 MG PO TABS: 1.0000 | ORAL_TABLET | Freq: Every evening | ORAL | 0 refills | Status: DC | PRN

## 2022-12-20 NOTE — Telephone Encounter (Signed)
sent 

## 2022-12-26 ENCOUNTER — Emergency Department (HOSPITAL_COMMUNITY): Payer: BC Managed Care – PPO

## 2022-12-26 ENCOUNTER — Other Ambulatory Visit: Payer: Self-pay

## 2022-12-26 ENCOUNTER — Inpatient Hospital Stay (HOSPITAL_COMMUNITY)
Admission: EM | Admit: 2022-12-26 | Discharge: 2022-12-30 | DRG: 871 | Disposition: A | Discharge status: Home / Self Care | Payer: BC Managed Care – PPO | Attending: Internal Medicine | Admitting: Internal Medicine

## 2022-12-26 DIAGNOSIS — R42 Dizziness and giddiness: Secondary | ICD-10-CM | POA: Diagnosis not present

## 2022-12-26 DIAGNOSIS — E875 Hyperkalemia: Secondary | ICD-10-CM | POA: Diagnosis not present

## 2022-12-26 DIAGNOSIS — R1011 Right upper quadrant pain: Secondary | ICD-10-CM | POA: Diagnosis not present

## 2022-12-26 DIAGNOSIS — R6510 Systemic inflammatory response syndrome (SIRS) of non-infectious origin without acute organ dysfunction: Secondary | ICD-10-CM | POA: Diagnosis present

## 2022-12-26 DIAGNOSIS — J069 Acute upper respiratory infection, unspecified: Secondary | ICD-10-CM | POA: Diagnosis present

## 2022-12-26 DIAGNOSIS — Z888 Allergy status to other drugs, medicaments and biological substances status: Secondary | ICD-10-CM | POA: Diagnosis not present

## 2022-12-26 DIAGNOSIS — J129 Viral pneumonia, unspecified: Secondary | ICD-10-CM | POA: Diagnosis not present

## 2022-12-26 DIAGNOSIS — N179 Acute kidney failure, unspecified: Secondary | ICD-10-CM | POA: Diagnosis present

## 2022-12-26 DIAGNOSIS — E1042 Type 1 diabetes mellitus with diabetic polyneuropathy: Secondary | ICD-10-CM | POA: Diagnosis present

## 2022-12-26 DIAGNOSIS — R9431 Abnormal electrocardiogram [ECG] [EKG]: Secondary | ICD-10-CM | POA: Diagnosis not present

## 2022-12-26 DIAGNOSIS — Z91048 Other nonmedicinal substance allergy status: Secondary | ICD-10-CM | POA: Diagnosis not present

## 2022-12-26 DIAGNOSIS — Z87891 Personal history of nicotine dependence: Secondary | ICD-10-CM | POA: Diagnosis not present

## 2022-12-26 DIAGNOSIS — R112 Nausea with vomiting, unspecified: Secondary | ICD-10-CM | POA: Diagnosis not present

## 2022-12-26 DIAGNOSIS — B9781 Human metapneumovirus as the cause of diseases classified elsewhere: Secondary | ICD-10-CM | POA: Diagnosis present

## 2022-12-26 DIAGNOSIS — J3489 Other specified disorders of nose and nasal sinuses: Secondary | ICD-10-CM | POA: Diagnosis not present

## 2022-12-26 DIAGNOSIS — M542 Cervicalgia: Secondary | ICD-10-CM | POA: Diagnosis present

## 2022-12-26 DIAGNOSIS — Z885 Allergy status to narcotic agent status: Secondary | ICD-10-CM | POA: Diagnosis not present

## 2022-12-26 DIAGNOSIS — R059 Cough, unspecified: Secondary | ICD-10-CM | POA: Diagnosis not present

## 2022-12-26 DIAGNOSIS — E039 Hypothyroidism, unspecified: Secondary | ICD-10-CM | POA: Diagnosis present

## 2022-12-26 DIAGNOSIS — Z79899 Other long term (current) drug therapy: Secondary | ICD-10-CM

## 2022-12-26 DIAGNOSIS — E871 Hypo-osmolality and hyponatremia: Secondary | ICD-10-CM | POA: Diagnosis present

## 2022-12-26 DIAGNOSIS — E063 Autoimmune thyroiditis: Secondary | ICD-10-CM | POA: Diagnosis present

## 2022-12-26 DIAGNOSIS — E1065 Type 1 diabetes mellitus with hyperglycemia: Secondary | ICD-10-CM

## 2022-12-26 DIAGNOSIS — Z833 Family history of diabetes mellitus: Secondary | ICD-10-CM

## 2022-12-26 DIAGNOSIS — A419 Sepsis, unspecified organism: Secondary | ICD-10-CM | POA: Diagnosis not present

## 2022-12-26 DIAGNOSIS — E1010 Type 1 diabetes mellitus with ketoacidosis without coma: Principal | ICD-10-CM | POA: Diagnosis present

## 2022-12-26 DIAGNOSIS — E860 Dehydration: Secondary | ICD-10-CM | POA: Diagnosis present

## 2022-12-26 DIAGNOSIS — Z79890 Hormone replacement therapy (postmenopausal): Secondary | ICD-10-CM

## 2022-12-26 DIAGNOSIS — Z794 Long term (current) use of insulin: Secondary | ICD-10-CM

## 2022-12-26 DIAGNOSIS — Z1152 Encounter for screening for COVID-19: Secondary | ICD-10-CM | POA: Diagnosis not present

## 2022-12-26 DIAGNOSIS — E1110 Type 2 diabetes mellitus with ketoacidosis without coma: Secondary | ICD-10-CM | POA: Diagnosis present

## 2022-12-26 DIAGNOSIS — M79669 Pain in unspecified lower leg: Secondary | ICD-10-CM | POA: Diagnosis not present

## 2022-12-26 DIAGNOSIS — R0602 Shortness of breath: Secondary | ICD-10-CM | POA: Diagnosis not present

## 2022-12-26 LAB — CBC WITH DIFFERENTIAL/PLATELET
Abs Immature Granulocytes: 0.19 10*3/uL — ABNORMAL HIGH (ref 0.00–0.07)
Basophils Absolute: 0.1 10*3/uL (ref 0.0–0.1)
Basophils Relative: 0 %
Eosinophils Absolute: 0 10*3/uL (ref 0.0–0.5)
Eosinophils Relative: 0 %
HCT: 45.5 % (ref 39.0–52.0)
Hemoglobin: 15.2 g/dL (ref 13.0–17.0)
Immature Granulocytes: 1 %
Lymphocytes Relative: 8 %
Lymphs Abs: 1.8 10*3/uL (ref 0.7–4.0)
MCH: 30 pg (ref 26.0–34.0)
MCHC: 33.4 g/dL (ref 30.0–36.0)
MCV: 89.7 fL (ref 80.0–100.0)
Monocytes Absolute: 1.7 10*3/uL — ABNORMAL HIGH (ref 0.1–1.0)
Monocytes Relative: 7 %
Neutro Abs: 19.2 10*3/uL — ABNORMAL HIGH (ref 1.7–7.7)
Neutrophils Relative %: 84 %
Platelets: 514 10*3/uL — ABNORMAL HIGH (ref 150–400)
RBC: 5.07 MIL/uL (ref 4.22–5.81)
RDW: 12.2 % (ref 11.5–15.5)
WBC: 23 10*3/uL — ABNORMAL HIGH (ref 4.0–10.5)
nRBC: 0 % (ref 0.0–0.2)

## 2022-12-26 LAB — URINALYSIS, ROUTINE W REFLEX MICROSCOPIC
Bacteria, UA: NONE SEEN
Bilirubin Urine: NEGATIVE
Glucose, UA: >=500 mg/dL — AB
Ketones, ur: 80 mg/dL — AB
Leukocytes,Ua: NEGATIVE
Nitrite: NEGATIVE
Protein, ur: 30 mg/dL — AB
Specific Gravity, Urine: 1.018 (ref 1.005–1.030)
pH: 5 (ref 5.0–8.0)

## 2022-12-26 LAB — RESP PANEL BY RT-PCR (RSV, FLU A&B, COVID)  RVPGX2
Influenza A by PCR: NEGATIVE
Influenza B by PCR: NEGATIVE
Resp Syncytial Virus by PCR: NEGATIVE
SARS Coronavirus 2 by RT PCR: NEGATIVE

## 2022-12-26 LAB — I-STAT CHEM 8, ED
BUN: 24 mg/dL — ABNORMAL HIGH (ref 6–20)
Calcium, Ion: 1.16 mmol/L (ref 1.15–1.40)
Chloride: 99 mmol/L (ref 98–111)
Creatinine, Ser: 1.4 mg/dL — ABNORMAL HIGH (ref 0.61–1.24)
Glucose, Bld: 517 mg/dL (ref 70–99)
HCT: 49 % (ref 39.0–52.0)
Hemoglobin: 16.7 g/dL (ref 13.0–17.0)
Potassium: 5.6 mmol/L — ABNORMAL HIGH (ref 3.5–5.1)
Sodium: 127 mmol/L — ABNORMAL LOW (ref 135–145)
TCO2: 13 mmol/L — ABNORMAL LOW (ref 22–32)

## 2022-12-26 LAB — BLOOD GAS, VENOUS
Acid-base deficit: 17.4 mmol/L — ABNORMAL HIGH (ref 0.0–2.0)
Bicarbonate: 9.3 mmol/L — ABNORMAL LOW (ref 20.0–28.0)
O2 Saturation: 55.1 %
Patient temperature: 36.1
pCO2, Ven: 24 mmHg — ABNORMAL LOW (ref 44–60)
pH, Ven: 7.19 — CL (ref 7.25–7.43)
pO2, Ven: <31 mmHg — CL (ref 32–45)

## 2022-12-26 LAB — CBG MONITORING, ED
Glucose-Capillary: 404 mg/dL — ABNORMAL HIGH (ref 70–99)
Glucose-Capillary: 433 mg/dL — ABNORMAL HIGH (ref 70–99)
Glucose-Capillary: 486 mg/dL — ABNORMAL HIGH (ref 70–99)
Glucose-Capillary: 511 mg/dL (ref 70–99)

## 2022-12-26 MED ORDER — LACTATED RINGERS IV BOLUS
20.0000 mL/kg | Freq: Once | INTRAVENOUS | Status: AC
  Administered 2022-12-26: 1496 mL via INTRAVENOUS

## 2022-12-26 MED ORDER — DIPHENHYDRAMINE HCL 25 MG PO CAPS
25.0000 mg | ORAL_CAPSULE | Freq: Once | ORAL | Status: AC
  Administered 2022-12-26: 25 mg via ORAL
  Filled 2022-12-26: qty 1

## 2022-12-26 MED ORDER — METOCLOPRAMIDE HCL 5 MG/ML IJ SOLN
10.0000 mg | Freq: Once | INTRAMUSCULAR | Status: AC
  Administered 2022-12-26: 10 mg via INTRAVENOUS
  Filled 2022-12-26: qty 2

## 2022-12-26 MED ORDER — ACETAMINOPHEN 500 MG PO TABS
1000.0000 mg | ORAL_TABLET | Freq: Once | ORAL | Status: AC
  Administered 2022-12-26: 1000 mg via ORAL
  Filled 2022-12-26: qty 2

## 2022-12-26 MED ORDER — ONDANSETRON HCL 4 MG/2ML IJ SOLN
4.0000 mg | Freq: Once | INTRAMUSCULAR | Status: AC
  Administered 2022-12-26: 4 mg via INTRAVENOUS
  Filled 2022-12-26: qty 2

## 2022-12-26 MED ORDER — LACTATED RINGERS IV SOLN: INTRAVENOUS | Status: DC

## 2022-12-26 MED ORDER — DEXTROSE 50 % IV SOLN: 0.0000 mL | INTRAVENOUS | Status: DC | PRN

## 2022-12-26 MED ORDER — SODIUM CHLORIDE 0.9 % IV SOLN: Freq: Once | INTRAVENOUS | Status: AC

## 2022-12-26 MED ORDER — INSULIN REGULAR(HUMAN) IN NACL 100-0.9 UT/100ML-% IV SOLN
INTRAVENOUS | Status: DC
  Administered 2022-12-26: 9 [IU]/h via INTRAVENOUS
  Administered 2022-12-27: 3 [IU]/h via INTRAVENOUS
  Filled 2022-12-26 (×2): qty 100

## 2022-12-26 MED ORDER — SODIUM CHLORIDE 0.9 % IV BOLUS
1000.0000 mL | Freq: Once | INTRAVENOUS | Status: AC
  Administered 2022-12-26: 1000 mL via INTRAVENOUS

## 2022-12-26 MED ORDER — IPRATROPIUM-ALBUTEROL 0.5-2.5 (3) MG/3ML IN SOLN
3.0000 mL | Freq: Once | RESPIRATORY_TRACT | Status: AC
  Administered 2022-12-26: 3 mL via RESPIRATORY_TRACT
  Filled 2022-12-26: qty 3

## 2022-12-26 MED ORDER — DEXTROSE IN LACTATED RINGERS 5 % IV SOLN: INTRAVENOUS | Status: DC

## 2022-12-26 NOTE — ED Triage Notes (Signed)
Pt arrives with c/o cough, congestion, SOB, and n/v that about a week ago. Pts family have been sick lately. Pt endorses fatigue. Per pt, his blood sugars have been running in the 200s.

## 2022-12-26 NOTE — ED Notes (Signed)
Cbd 433, endotool no change, 9units. Recheck at 2326

## 2022-12-26 NOTE — ED Notes (Addendum)
Endotool calculated 9units for insulin drip, pt stated he is still nausea, pain 7-8/10. Recheck CBG/Endotool at 2246

## 2022-12-26 NOTE — ED Provider Notes (Incomplete)
[place] EM[facility]GENC[facility] DEPARTMENT AT [facility] Provider Note   CSN: [id number] Arrival date & time: 12/26/22  1854     History {Add pertinent medical, surgical, social history, OB history to HPI:1} Chief Complaint  Patient presents with  . Flu Like Symptoms    David Ramsey is a 27 y.o. male.  HPI Patient is a 27 year old male with past medical history significant for DM1 with multiple admissions, recently switched to 70/30 insulin because of issues with affording regular insulin and has had blood sugars have been running high since that time.  Over the past 1 week patient has had more cough congestion fatigue nausea and vomiting over the past couple days some abdominal pain and fatigue.  Denies any chest pain  Does endorse some upper abdominal pain currently.      Home Medications Prior to Admission medications   Medication Sig Start Date End Date Taking? Authorizing Provider  acetaminophen-codeine (TYLENOL #3) 300-30 MG tablet Take 1 tablet by mouth at bedtime as needed for moderate pain. 12/20/22   Cristie Hem, PA-C  albuterol (VENTOLIN HFA) 108 (90 Base) MCG/ACT inhaler Inhale 2 puffs into the lungs every 6 (six) hours as needed for wheezing or shortness of breath. 11/08/21   Alford Highland, MD  blood glucose meter kit and supplies KIT Dispense based on patient and insurance preference. Use up to four times daily as directed. 11/03/20   Marguerita Merles Latif, DO  FLUoxetine (PROZAC) 40 MG capsule Take 1 capsule (40 mg total) by mouth daily. 03/22/22   Hollice Espy, MD  HYDROcodone-acetaminophen (NORCO) 5-325 MG tablet Take 1 tablet by mouth at bedtime as needed. 11/03/22   Tarry Kos, MD  insulin detemir (LEVEMIR FLEXTOUCH) 100 UNIT/ML FlexPen Inject 15 Units into the skin 2 (two) times daily. 08/24/22   [name], NP  insulin lispro (HUMALOG KWIKPEN) 200 UNIT/ML KwikPen Take 3 times a day with meals.  1 unit for CBG 121-150, 2 units for 151-200, 3  units for 201-250, 5 units for 251-300, 7 units for 301-350, 9 units for 351-400. 08/24/22   [name], NP  levothyroxine (SYNTHROID) 175 MCG tablet Take 1 tablet (175 mcg total) by mouth daily. 08/24/22   [name], NP  naproxen (NAPROSYN) 500 MG tablet Take 1 tablet (500 mg total) by mouth 2 (two) times daily with a meal. 08/18/22   Jene Every, MD  ondansetron (ZOFRAN-ODT) 4 MG disintegrating tablet Take 1 tablet (4 mg total) by mouth every 8 (eight) hours as needed for nausea or vomiting. 08/24/22   [name], NP  pantoprazole (PROTONIX) 40 MG tablet Take 1 tablet (40 mg total) by mouth daily. 08/24/22 09/23/22  [name], NP  pregabalin (LYRICA) 75 MG capsule Take 1 capsule (75 mg total) by mouth daily. 11/24/22 12/24/22  [name], NP  traMADol (ULTRAM) 50 MG tablet Take 1-2 tablets (50-100 mg total) by mouth daily as needed. 10/06/22   Cristie Hem, PA-C      Allergies    Tape    Review of Systems   Review of Systems  Physical Exam Updated Vital Signs BP (!) 144/104 (BP Location: Left Arm)   Pulse (!) 103   Temp 97.9 F (36.6 C) (Oral)   Resp 16   Wt 74.8 kg   SpO2 96%   BMI 26.63 kg/m  Physical Exam Vitals and nursing note reviewed.  Constitutional:      General: He is not  in acute distress.    Appearance: He is ill-appearing.  HENT:     Head: Normocephalic and atraumatic.     Nose: Nose normal.  Eyes:     General: No scleral icterus. Cardiovascular:     Rate and Rhythm: Normal rate and regular rhythm.     Pulses: Normal pulses.     Heart sounds: Normal heart sounds.  Pulmonary:     Effort: Pulmonary effort is normal. No respiratory distress.     Breath sounds: No wheezing.  Abdominal:     Palpations: Abdomen is soft.     Tenderness: There is abdominal tenderness.     Comments: Upper abdominal tenderness no guarding or rebound  Musculoskeletal:     Cervical back: Normal range of motion.     Right lower leg: No edema.     Left lower  leg: No edema.  Skin:    General: Skin is warm and dry.     Capillary Refill: Capillary refill takes less than 2 seconds.  Neurological:     Mental Status: He is alert. Mental status is at baseline.  Psychiatric:        Mood and Affect: Mood normal.        Behavior: Behavior normal.     ED Results / Procedures / Treatments   Labs (all labs ordered are listed, but only abnormal results are displayed) Labs Reviewed  CBC WITH DIFFERENTIAL/PLATELET - Abnormal; Notable for the following components:      Result Value   WBC 23.0 (*)    Platelets 514 (*)    Neutro Abs 19.2 (*)    Monocytes Absolute 1.7 (*)    Abs Immature Granulocytes 0.19 (*)    All other components within normal limits  URINALYSIS, ROUTINE W REFLEX MICROSCOPIC - Abnormal; Notable for the following components:   Color, Urine STRAW (*)    Glucose, UA >=500 (*)    Hgb urine dipstick SMALL (*)    Ketones, ur 80 (*)    Protein, ur 30 (*)    All other components within normal limits  CBG MONITORING, ED - Abnormal; Notable for the following components:   Glucose-Capillary 511 (*)    All other components within normal limits  I-STAT CHEM 8, ED - Abnormal; Notable for the following components:   Sodium 127 (*)    Potassium 5.6 (*)    BUN 24 (*)    Creatinine, Ser 1.40 (*)    Glucose, Bld 517 (*)    TCO2 13 (*)    All other components within normal limits  CBG MONITORING, ED - Abnormal; Notable for the following components:   Glucose-Capillary 486 (*)    All other components within normal limits  CBG MONITORING, ED - Abnormal; Notable for the following components:   Glucose-Capillary 433 (*)    All other components within normal limits  RESP PANEL BY RT-PCR (RSV, FLU A&B, COVID)  RVPGX2  CULTURE, BLOOD (ROUTINE X 2)  CULTURE, BLOOD (ROUTINE X 2)  COMPREHENSIVE METABOLIC PANEL  LACTIC ACID, PLASMA  LACTIC ACID, PLASMA  BLOOD GAS, VENOUS    EKG None  Radiology DG Chest Portable 1 View  Result Date:  12/26/2022 CLINICAL DATA:  Cough and sepsis EXAM: PORTABLE CHEST 1 VIEW COMPARISON:  X-ray 04/11/2022 and older FINDINGS: Slight elevation of the right hemidiaphragm along its lateral aspect. No consolidation, pneumothorax or edema. Mild peribronchial thickening on the left. Normal cardiopericardial silhouette. Overlapping cardiac leads IMPRESSION: Slight elevation of the right hemidiaphragm. Mild peribronchial  thickening on the left.  No consolidation Electronically Signed   By: Karen Kays M.D.   On: 12/26/2022 21:44    Procedures Procedures  {Document cardiac monitor, telemetry assessment procedure when appropriate:1}  Medications Ordered in ED Medications  insulin regular, human (MYXREDLIN) 100 units/ 100 mL infusion (9 Units/hr Intravenous New Bag/Given 12/26/22 2158)  lactated ringers infusion ( Intravenous New Bag/Given 12/26/22 2135)  dextrose 5 % in lactated ringers infusion (has no administration in time range)  dextrose 50 % solution 0-50 mL (has no administration in time range)  lactated ringers bolus 1,496 mL (1,496 mLs Intravenous New Bag/Given 12/26/22 2116)  ondansetron (ZOFRAN) injection 4 mg (4 mg Intravenous Given 12/26/22 2116)    ED Course/ Medical Decision Making/ A&P Clinical Course as of 12/26/22 2340  Sat Dec 26, 2022  2321 DKA - admit after CT AP [WF]    Clinical Course User Index [WF] Gailen Shelter, PA   {   Click here for ABCD2, HEART and other calculatorsREFRESH Note before signing :1}                          Medical Decision Making Amount and/or Complexity of Data Reviewed Labs: ordered. Radiology: ordered.  Risk OTC drugs. Prescription drug management. Decision regarding hospitalization.   ***  {Document critical care time when appropriate:1} {Document review of labs and clinical decision tools ie heart score, Chads2Vasc2 etc:1}  {Document your independent review of radiology images, and any outside records:1} {Document your discussion with  family members, caretakers, and with consultants:1} {Document social determinants of health affecting pt's care:1} {Document your decision making why or why not admission, treatments were needed:1} Final Clinical Impression(s) / ED Diagnoses Final diagnoses:  None    Rx / DC Orders ED Discharge Orders     None

## 2022-12-26 NOTE — ED Provider Notes (Addendum)
[place] EMERGENCY DEPARTMENT AT [facility] Provider Note   CSN: [id number] Arrival date & time: 12/26/22  1854     History  Chief Complaint  Patient presents with   Flu Like Symptoms    David Ramsey is a 27 y.o. male.  HPI Patient is a 27 year old male with past medical history significant for DM1 with multiple admissions, recently switched to 70/30 insulin because of issues with affording regular insulin and has had blood sugars have been running high since that time.  Over the past 1 week patient has had more cough congestion fatigue nausea and vomiting over the past couple days some abdominal pain and fatigue.  Denies any chest pain  Does endorse some upper abdominal pain currently.      Home Medications Prior to Admission medications   Medication Sig Start Date End Date Taking? Authorizing Provider  acetaminophen-codeine (TYLENOL #3) 300-30 MG tablet Take 1 tablet by mouth at bedtime as needed for moderate pain. 12/20/22   Cristie Hem, PA-C  albuterol (VENTOLIN HFA) 108 (90 Base) MCG/ACT inhaler Inhale 2 puffs into the lungs every 6 (six) hours as needed for wheezing or shortness of breath. 11/08/21   Alford Highland, MD  blood glucose meter kit and supplies KIT Dispense based on patient and insurance preference. Use up to four times daily as directed. 11/03/20   Marguerita Merles Latif, DO  FLUoxetine (PROZAC) 40 MG capsule Take 1 capsule (40 mg total) by mouth daily. 03/22/22   Hollice Espy, MD  HYDROcodone-acetaminophen (NORCO) 5-325 MG tablet Take 1 tablet by mouth at bedtime as needed. 11/03/22   Tarry Kos, MD  insulin detemir (LEVEMIR FLEXTOUCH) 100 UNIT/ML FlexPen Inject 15 Units into the skin 2 (two) times daily. 08/24/22   [name], NP  insulin lispro (HUMALOG KWIKPEN) 200 UNIT/ML KwikPen Take 3 times a day with meals.  1 unit for CBG 121-150, 2 units for 151-200, 3 units for 201-250, 5 units for 251-300, 7 units for 301-350, 9 units for  351-400. 08/24/22   [name], NP  levothyroxine (SYNTHROID) 175 MCG tablet Take 1 tablet (175 mcg total) by mouth daily. 08/24/22   [name], NP  naproxen (NAPROSYN) 500 MG tablet Take 1 tablet (500 mg total) by mouth 2 (two) times daily with a meal. 08/18/22   Jene Every, MD  ondansetron (ZOFRAN-ODT) 4 MG disintegrating tablet Take 1 tablet (4 mg total) by mouth every 8 (eight) hours as needed for nausea or vomiting. 08/24/22   [name], NP  pantoprazole (PROTONIX) 40 MG tablet Take 1 tablet (40 mg total) by mouth daily. 08/24/22 09/23/22  [name], NP  pregabalin (LYRICA) 75 MG capsule Take 1 capsule (75 mg total) by mouth daily. 11/24/22 12/24/22  [name], NP  traMADol (ULTRAM) 50 MG tablet Take 1-2 tablets (50-100 mg total) by mouth daily as needed. 10/06/22   Cristie Hem, PA-C      Allergies    Tape    Review of Systems   Review of Systems  Physical Exam Updated Vital Signs BP (!) 144/104 (BP Location: Left Arm)   Pulse (!) 103   Temp 97.9 F (36.6 C) (Oral)   Resp 16   Wt 74.8 kg   SpO2 96%   BMI 26.63 kg/m  Physical Exam Vitals and nursing note reviewed.  Constitutional:      General: He is not in acute distress.    Appearance: He is  ill-appearing.  HENT:     Head: Normocephalic and atraumatic.     Nose: Nose normal.  Eyes:     General: No scleral icterus. Cardiovascular:     Rate and Rhythm: Normal rate and regular rhythm.     Pulses: Normal pulses.     Heart sounds: Normal heart sounds.  Pulmonary:     Effort: Pulmonary effort is normal. No respiratory distress.     Breath sounds: No wheezing.  Abdominal:     Palpations: Abdomen is soft.     Tenderness: There is abdominal tenderness.     Comments: Upper abdominal tenderness no guarding or rebound  Musculoskeletal:     Cervical back: Normal range of motion.     Right lower leg: No edema.     Left lower leg: No edema.  Skin:    General: Skin is warm and dry.     Capillary  Refill: Capillary refill takes less than 2 seconds.  Neurological:     Mental Status: He is alert. Mental status is at baseline.  Psychiatric:        Mood and Affect: Mood normal.        Behavior: Behavior normal.     ED Results / Procedures / Treatments   Labs (all labs ordered are listed, but only abnormal results are displayed) Labs Reviewed  CBC WITH DIFFERENTIAL/PLATELET - Abnormal; Notable for the following components:      Result Value   WBC 23.0 (*)    Platelets 514 (*)    Neutro Abs 19.2 (*)    Monocytes Absolute 1.7 (*)    Abs Immature Granulocytes 0.19 (*)    All other components within normal limits  URINALYSIS, ROUTINE W REFLEX MICROSCOPIC - Abnormal; Notable for the following components:   Color, Urine STRAW (*)    Glucose, UA >=500 (*)    Hgb urine dipstick SMALL (*)    Ketones, ur 80 (*)    Protein, ur 30 (*)    All other components within normal limits  CBG MONITORING, ED - Abnormal; Notable for the following components:   Glucose-Capillary 511 (*)    All other components within normal limits  I-STAT CHEM 8, ED - Abnormal; Notable for the following components:   Sodium 127 (*)    Potassium 5.6 (*)    BUN 24 (*)    Creatinine, Ser 1.40 (*)    Glucose, Bld 517 (*)    TCO2 13 (*)    All other components within normal limits  CBG MONITORING, ED - Abnormal; Notable for the following components:   Glucose-Capillary 486 (*)    All other components within normal limits  CBG MONITORING, ED - Abnormal; Notable for the following components:   Glucose-Capillary 433 (*)    All other components within normal limits  RESP PANEL BY RT-PCR (RSV, FLU A&B, COVID)  RVPGX2  CULTURE, BLOOD (ROUTINE X 2)  CULTURE, BLOOD (ROUTINE X 2)  COMPREHENSIVE METABOLIC PANEL  LACTIC ACID, PLASMA  LACTIC ACID, PLASMA  BLOOD GAS, VENOUS    EKG None  Radiology DG Chest Portable 1 View  Result Date: 12/26/2022 CLINICAL DATA:  Cough and sepsis EXAM: PORTABLE CHEST 1 VIEW  COMPARISON:  X-ray 04/11/2022 and older FINDINGS: Slight elevation of the right hemidiaphragm along its lateral aspect. No consolidation, pneumothorax or edema. Mild peribronchial thickening on the left. Normal cardiopericardial silhouette. Overlapping cardiac leads IMPRESSION: Slight elevation of the right hemidiaphragm. Mild peribronchial thickening on the left.  No consolidation Electronically Signed  By: Karen Kays M.D.   On: 12/26/2022 21:44    Procedures .Critical Care  Performed by: Gailen Shelter, PA Authorized by: Gailen Shelter, PA   Critical care provider statement:    Critical care time (minutes):  35   Critical care time was exclusive of:  Separately billable procedures and treating other patients and teaching time   Critical care was necessary to treat or prevent imminent or life-threatening deterioration of the following conditions:  Metabolic crisis (metabolic crisis)   Critical care was time spent personally by me on the following activities:  Development of treatment plan with patient or surrogate, review of old charts, re-evaluation of patient's condition, pulse oximetry, ordering and review of radiographic studies, ordering and review of laboratory studies, ordering and performing treatments and interventions, obtaining history from patient or surrogate, examination of patient and evaluation of patient's response to treatment   Care discussed with: admitting provider       Medications Ordered in ED Medications  insulin regular, human (MYXREDLIN) 100 units/ 100 mL infusion (9 Units/hr Intravenous New Bag/Given 12/26/22 2158)  lactated ringers infusion ( Intravenous New Bag/Given 12/26/22 2135)  dextrose 5 % in lactated ringers infusion (has no administration in time range)  dextrose 50 % solution 0-50 mL (has no administration in time range)  lactated ringers bolus 1,496 mL (1,496 mLs Intravenous New Bag/Given 12/26/22 2116)  ondansetron (ZOFRAN) injection 4 mg (4 mg  Intravenous Given 12/26/22 2116)    ED Course/ Medical Decision Making/ A&P Clinical Course as of 12/26/22 2340  Sat Dec 26, 2022  2321 DKA - admit after CT AP [WF]    Clinical Course User Index [WF] Gailen Shelter, PA                             Medical Decision Making Amount and/or Complexity of Data Reviewed Labs: ordered. Radiology: ordered.  Risk OTC drugs. Prescription drug management. Decision regarding hospitalization.   This patient presents to the ED for concern of abd pain, this involves a number of treatment options, and is a complaint that carries with it a moderate risk of complications and morbidity. A differential diagnosis was considered for the patient's symptoms which is discussed below:   The causes of generalized abdominal pain include but are not limited to AAA, mesenteric ischemia, appendicitis, diverticulitis, DKA, gastritis, gastroenteritis, AMI, nephrolithiasis, pancreatitis, peritonitis, adrenal insufficiency,lead poisoning, iron toxicity, intestinal ischemia, constipation, UTI,SBO/LBO, splenic rupture, biliary disease, IBD, IBS, PUD, or hepatitis.   Co morbidities: Discussed in HPI   Brief History:  Patient is a 27 year old male with past medical history significant for DM1 with multiple admissions, recently switched to 70/30 insulin because of issues with affording regular insulin and has had blood sugars have been running high since that time.  Over the past 1 week patient has had more cough congestion fatigue nausea and vomiting over the past couple days some abdominal pain and fatigue.  Denies any chest pain  Does endorse some upper abdominal pain currently.     EMR reviewed including pt PMHx, past surgical history and past visits to ER.   See HPI for more details   Lab Tests:   I ordered and independently interpreted labs. Labs notable for   Imaging Studies:  Abnormal findings. I personally reviewed all imaging studies. Imaging  notable for Some parabronchial cuffing consistent with viral illness seen on chest x-ray.  No focal infiltrate or pneumothorax.  Will  provide ultrasound without abnormal finding.   Cardiac Monitoring:  The patient was maintained on a cardiac monitor.  I personally viewed and interpreted the cardiac monitored which showed an underlying rhythm of: Sinus tachycardia EKG non-ischemic .   Medicines ordered:  I ordered medication including lactated Ringer's, DuoNeb for wheezing and hyperkalemia, Tylenol, Zofran, Reglan, Benadryl, insulin for hyperglycemia Reevaluation of the patient after these medicines showed that the patient improved I have reviewed the patients home medicines and have made adjustments as needed   Critical Interventions:   admission for DKA   Consults/Attending Physician   I requested consultation with Dr. Antionette Char of hospitalist service who will admit,  and discussed lab and imaging findings as well as pertinent plan - they recommend:     Reevaluation:  After the interventions noted above I re-evaluated patient and found that they have :improved   Social Determinants of Health:  The patient's social determinants of health were a factor in the care of this patient    Problem List / ED Course:  DKA likely multifactorial could be related to recent change to 70/30 insulin for financial reasons also may be related to viral illness.  Hydrated and started on insulin admitted to hospital   Dispostion:  After consideration of the diagnostic results and the patients response to treatment, I feel that the patent would benefit from admission.    Final Clinical Impression(s) / ED Diagnoses Final diagnoses:  Diabetic ketoacidosis without coma associated with type 1 diabetes mellitus    Rx / DC Orders ED Discharge Orders     None         Gailen Shelter, [place] 12/27/22 1610    Palumbo, April, MD 12/27/22 0748    Gailen Shelter, PA 03/22/23 1609     Palumbo, April, MD 03/22/23 2302

## 2022-12-27 ENCOUNTER — Encounter (HOSPITAL_COMMUNITY): Payer: Self-pay | Admitting: Family Medicine

## 2022-12-27 ENCOUNTER — Inpatient Hospital Stay (HOSPITAL_COMMUNITY): Payer: BC Managed Care – PPO

## 2022-12-27 DIAGNOSIS — N179 Acute kidney failure, unspecified: Secondary | ICD-10-CM | POA: Diagnosis present

## 2022-12-27 DIAGNOSIS — E875 Hyperkalemia: Secondary | ICD-10-CM

## 2022-12-27 DIAGNOSIS — E1010 Type 1 diabetes mellitus with ketoacidosis without coma: Secondary | ICD-10-CM

## 2022-12-27 DIAGNOSIS — E039 Hypothyroidism, unspecified: Secondary | ICD-10-CM

## 2022-12-27 DIAGNOSIS — B9781 Human metapneumovirus as the cause of diseases classified elsewhere: Secondary | ICD-10-CM | POA: Diagnosis present

## 2022-12-27 DIAGNOSIS — Z833 Family history of diabetes mellitus: Secondary | ICD-10-CM | POA: Diagnosis not present

## 2022-12-27 DIAGNOSIS — Z79890 Hormone replacement therapy (postmenopausal): Secondary | ICD-10-CM | POA: Diagnosis not present

## 2022-12-27 DIAGNOSIS — Z885 Allergy status to narcotic agent status: Secondary | ICD-10-CM | POA: Diagnosis not present

## 2022-12-27 DIAGNOSIS — Z79899 Other long term (current) drug therapy: Secondary | ICD-10-CM | POA: Diagnosis not present

## 2022-12-27 DIAGNOSIS — Z888 Allergy status to other drugs, medicaments and biological substances status: Secondary | ICD-10-CM | POA: Diagnosis not present

## 2022-12-27 DIAGNOSIS — Z794 Long term (current) use of insulin: Secondary | ICD-10-CM | POA: Diagnosis not present

## 2022-12-27 DIAGNOSIS — A419 Sepsis, unspecified organism: Secondary | ICD-10-CM | POA: Diagnosis present

## 2022-12-27 DIAGNOSIS — Z87891 Personal history of nicotine dependence: Secondary | ICD-10-CM | POA: Diagnosis not present

## 2022-12-27 DIAGNOSIS — E1110 Type 2 diabetes mellitus with ketoacidosis without coma: Secondary | ICD-10-CM | POA: Diagnosis present

## 2022-12-27 DIAGNOSIS — Z1152 Encounter for screening for COVID-19: Secondary | ICD-10-CM | POA: Diagnosis not present

## 2022-12-27 DIAGNOSIS — E063 Autoimmune thyroiditis: Secondary | ICD-10-CM | POA: Diagnosis present

## 2022-12-27 DIAGNOSIS — J069 Acute upper respiratory infection, unspecified: Secondary | ICD-10-CM | POA: Diagnosis present

## 2022-12-27 DIAGNOSIS — Z91048 Other nonmedicinal substance allergy status: Secondary | ICD-10-CM | POA: Diagnosis not present

## 2022-12-27 DIAGNOSIS — J129 Viral pneumonia, unspecified: Secondary | ICD-10-CM | POA: Diagnosis present

## 2022-12-27 DIAGNOSIS — R6510 Systemic inflammatory response syndrome (SIRS) of non-infectious origin without acute organ dysfunction: Secondary | ICD-10-CM | POA: Diagnosis present

## 2022-12-27 DIAGNOSIS — M542 Cervicalgia: Secondary | ICD-10-CM | POA: Diagnosis present

## 2022-12-27 DIAGNOSIS — M79669 Pain in unspecified lower leg: Secondary | ICD-10-CM | POA: Diagnosis not present

## 2022-12-27 DIAGNOSIS — E871 Hypo-osmolality and hyponatremia: Secondary | ICD-10-CM | POA: Diagnosis present

## 2022-12-27 DIAGNOSIS — E1042 Type 1 diabetes mellitus with diabetic polyneuropathy: Secondary | ICD-10-CM | POA: Diagnosis present

## 2022-12-27 DIAGNOSIS — E860 Dehydration: Secondary | ICD-10-CM | POA: Diagnosis present

## 2022-12-27 LAB — COMPREHENSIVE METABOLIC PANEL
ALT: 19 U/L (ref 0–44)
AST: 23 U/L (ref 15–41)
Albumin: 3.7 g/dL (ref 3.5–5.0)
Alkaline Phosphatase: 124 U/L (ref 38–126)
Anion gap: 28 — ABNORMAL HIGH (ref 5–15)
BUN: 20 mg/dL (ref 6–20)
CO2: 10 mmol/L — ABNORMAL LOW (ref 22–32)
Calcium: 9.5 mg/dL (ref 8.9–10.3)
Chloride: 90 mmol/L — ABNORMAL LOW (ref 98–111)
Creatinine, Ser: 1.9 mg/dL — ABNORMAL HIGH (ref 0.61–1.24)
GFR, Estimated: 50 mL/min — ABNORMAL LOW (ref >60)
Glucose, Bld: 495 mg/dL — ABNORMAL HIGH (ref 70–99)
Potassium: 5.5 mmol/L — ABNORMAL HIGH (ref 3.5–5.1)
Sodium: 128 mmol/L — ABNORMAL LOW (ref 135–145)
Total Bilirubin: 1.7 mg/dL — ABNORMAL HIGH (ref 0.3–1.2)
Total Protein: 8.5 g/dL — ABNORMAL HIGH (ref 6.5–8.1)

## 2022-12-27 LAB — GLUCOSE, CAPILLARY
Glucose-Capillary: 256 mg/dL — ABNORMAL HIGH (ref 70–99)
Glucose-Capillary: 237 mg/dL — ABNORMAL HIGH (ref 70–99)
Glucose-Capillary: 213 mg/dL — ABNORMAL HIGH (ref 70–99)
Glucose-Capillary: 217 mg/dL — ABNORMAL HIGH (ref 70–99)
Glucose-Capillary: 219 mg/dL — ABNORMAL HIGH (ref 70–99)
Glucose-Capillary: 194 mg/dL — ABNORMAL HIGH (ref 70–99)
Glucose-Capillary: 168 mg/dL — ABNORMAL HIGH (ref 70–99)
Glucose-Capillary: 158 mg/dL — ABNORMAL HIGH (ref 70–99)
Glucose-Capillary: 144 mg/dL — ABNORMAL HIGH (ref 70–99)
Glucose-Capillary: 150 mg/dL — ABNORMAL HIGH (ref 70–99)
Glucose-Capillary: 103 mg/dL — ABNORMAL HIGH (ref 70–99)
Glucose-Capillary: 139 mg/dL — ABNORMAL HIGH (ref 70–99)
Glucose-Capillary: 127 mg/dL — ABNORMAL HIGH (ref 70–99)
Glucose-Capillary: 156 mg/dL — ABNORMAL HIGH (ref 70–99)
Glucose-Capillary: 140 mg/dL — ABNORMAL HIGH (ref 70–99)
Glucose-Capillary: 117 mg/dL — ABNORMAL HIGH (ref 70–99)
Glucose-Capillary: 150 mg/dL — ABNORMAL HIGH (ref 70–99)
Glucose-Capillary: 148 mg/dL — ABNORMAL HIGH (ref 70–99)
Glucose-Capillary: 184 mg/dL — ABNORMAL HIGH (ref 70–99)
Glucose-Capillary: 193 mg/dL — ABNORMAL HIGH (ref 70–99)

## 2022-12-27 LAB — BASIC METABOLIC PANEL
Anion gap: 11 (ref 5–15)
Anion gap: 16 — ABNORMAL HIGH (ref 5–15)
Anion gap: 11 (ref 5–15)
Anion gap: 13 (ref 5–15)
BUN: 18 mg/dL (ref 6–20)
BUN: 17 mg/dL (ref 6–20)
BUN: 14 mg/dL (ref 6–20)
BUN: 13 mg/dL (ref 6–20)
CO2: 17 mmol/L — ABNORMAL LOW (ref 22–32)
CO2: 17 mmol/L — ABNORMAL LOW (ref 22–32)
CO2: 20 mmol/L — ABNORMAL LOW (ref 22–32)
CO2: 20 mmol/L — ABNORMAL LOW (ref 22–32)
Calcium: 8.3 mg/dL — ABNORMAL LOW (ref 8.9–10.3)
Calcium: 8.4 mg/dL — ABNORMAL LOW (ref 8.9–10.3)
Calcium: 8.2 mg/dL — ABNORMAL LOW (ref 8.9–10.3)
Calcium: 8.3 mg/dL — ABNORMAL LOW (ref 8.9–10.3)
Chloride: 101 mmol/L (ref 98–111)
Chloride: 101 mmol/L (ref 98–111)
Chloride: 104 mmol/L (ref 98–111)
Chloride: 101 mmol/L (ref 98–111)
Creatinine, Ser: 1.32 mg/dL — ABNORMAL HIGH (ref 0.61–1.24)
Creatinine, Ser: 1.09 mg/dL (ref 0.61–1.24)
Creatinine, Ser: 1.07 mg/dL (ref 0.61–1.24)
Creatinine, Ser: 1.11 mg/dL (ref 0.61–1.24)
GFR, Estimated: >60 mL/min (ref >60)
GFR, Estimated: >60 mL/min (ref >60)
GFR, Estimated: >60 mL/min (ref >60)
GFR, Estimated: >60 mL/min (ref >60)
Glucose, Bld: 217 mg/dL — ABNORMAL HIGH (ref 70–99)
Glucose, Bld: 142 mg/dL — ABNORMAL HIGH (ref 70–99)
Glucose, Bld: 145 mg/dL — ABNORMAL HIGH (ref 70–99)
Glucose, Bld: 147 mg/dL — ABNORMAL HIGH (ref 70–99)
Potassium: 3.8 mmol/L (ref 3.5–5.1)
Potassium: 3.9 mmol/L (ref 3.5–5.1)
Potassium: 3.4 mmol/L — ABNORMAL LOW (ref 3.5–5.1)
Potassium: 3.2 mmol/L — ABNORMAL LOW (ref 3.5–5.1)
Sodium: 129 mmol/L — ABNORMAL LOW (ref 135–145)
Sodium: 134 mmol/L — ABNORMAL LOW (ref 135–145)
Sodium: 135 mmol/L (ref 135–145)
Sodium: 134 mmol/L — ABNORMAL LOW (ref 135–145)

## 2022-12-27 LAB — TROPONIN I (HIGH SENSITIVITY)
Troponin I (High Sensitivity): 82 ng/L — ABNORMAL HIGH (ref <18)
Troponin I (High Sensitivity): 95 ng/L — ABNORMAL HIGH (ref <18)
Troponin I (High Sensitivity): 99 ng/L — ABNORMAL HIGH (ref <18)
Troponin I (High Sensitivity): 92 ng/L — ABNORMAL HIGH (ref <18)

## 2022-12-27 LAB — CBC
HCT: 37.4 % — ABNORMAL LOW (ref 39.0–52.0)
Hemoglobin: 12.8 g/dL — ABNORMAL LOW (ref 13.0–17.0)
MCH: 30 pg (ref 26.0–34.0)
MCHC: 34.2 g/dL (ref 30.0–36.0)
MCV: 87.6 fL (ref 80.0–100.0)
Platelets: 460 10*3/uL — ABNORMAL HIGH (ref 150–400)
RBC: 4.27 MIL/uL (ref 4.22–5.81)
RDW: 12.1 % (ref 11.5–15.5)
WBC: 18.9 10*3/uL — ABNORMAL HIGH (ref 4.0–10.5)
nRBC: 0 % (ref 0.0–0.2)

## 2022-12-27 LAB — RESPIRATORY PANEL BY PCR

## 2022-12-27 LAB — PROCALCITONIN: Procalcitonin: 1.3 ng/mL

## 2022-12-27 LAB — BLOOD GAS, VENOUS
Acid-base deficit: 3.6 mmol/L — ABNORMAL HIGH (ref 0.0–2.0)
Bicarbonate: 20.4 mmol/L (ref 20.0–28.0)
O2 Saturation: 83.7 %
Patient temperature: 37
pCO2, Ven: 33 mmHg — ABNORMAL LOW (ref 44–60)
pH, Ven: 7.4 (ref 7.25–7.43)
pO2, Ven: 46 mmHg — ABNORMAL HIGH (ref 32–45)

## 2022-12-27 LAB — BETA-HYDROXYBUTYRIC ACID
Beta-Hydroxybutyric Acid: 1.1 mmol/L — ABNORMAL HIGH (ref 0.05–0.27)
Beta-Hydroxybutyric Acid: 1.38 mmol/L — ABNORMAL HIGH (ref 0.05–0.27)
Beta-Hydroxybutyric Acid: 1.58 mmol/L — ABNORMAL HIGH (ref 0.05–0.27)

## 2022-12-27 LAB — HIV ANTIBODY (ROUTINE TESTING W REFLEX): HIV Screen 4th Generation wRfx: NONREACTIVE

## 2022-12-27 LAB — LACTIC ACID, PLASMA
Lactic Acid, Venous: 3.5 mmol/L (ref 0.5–1.9)
Lactic Acid, Venous: 3.2 mmol/L (ref 0.5–1.9)

## 2022-12-27 LAB — CBG MONITORING, ED
Glucose-Capillary: 281 mg/dL — ABNORMAL HIGH (ref 70–99)
Glucose-Capillary: 319 mg/dL — ABNORMAL HIGH (ref 70–99)

## 2022-12-27 LAB — LIPASE, BLOOD: Lipase: 29 U/L (ref 11–51)

## 2022-12-27 LAB — CULTURE, BLOOD (ROUTINE X 2)

## 2022-12-27 LAB — MRSA NEXT GEN BY PCR, NASAL: MRSA by PCR Next Gen: NOT DETECTED

## 2022-12-27 IMAGING — CT CT ABD-PELV W/ CM
2 of 4 series · 15 of 46 positions shown, 17 images · IV contrast (APPLIED)
Comparison: 11/30/2021

CLINICAL DATA: Acute pancreatitis

EXAM:
CT ABDOMEN AND PELVIS WITH CONTRAST
TECHNIQUE: Multidetector CT imaging of the abdomen and pelvis was performed
using the standard protocol following bolus administration of
intravenous contrast.

[Series 2: routine abd/pel with · axial · 0.71mm/px · z∈[-1037,-562]mm · 12 of 105 slices shown, 14 images]
[im 5/105  soft-tissue]
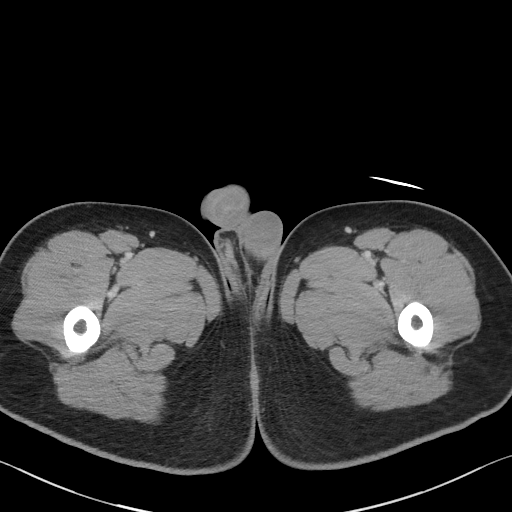
[im 5/105  bone]
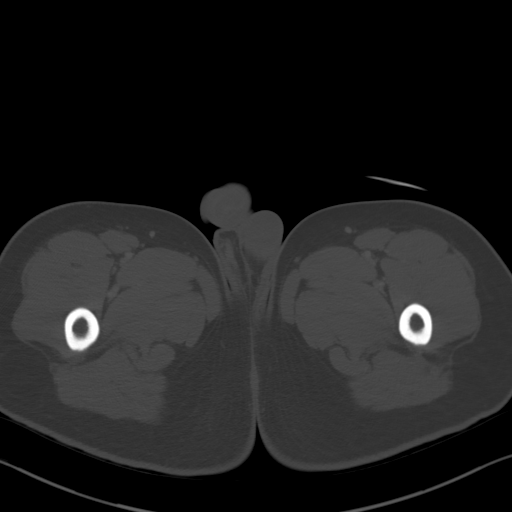
[im 14/105  soft-tissue]
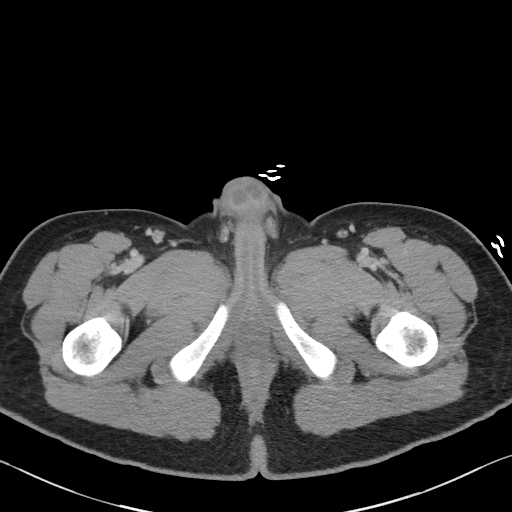
[im 23/105  soft-tissue]
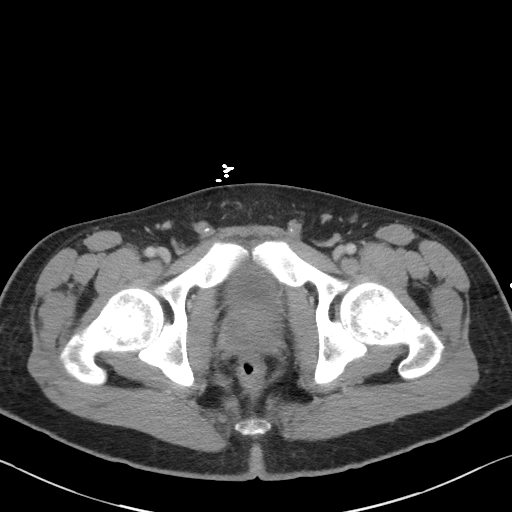
[im 32/105  soft-tissue]
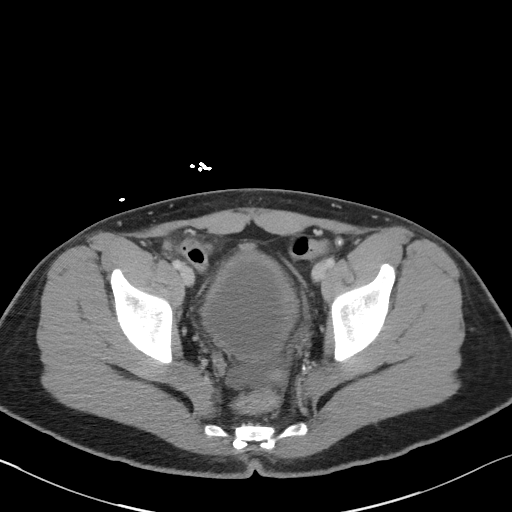
[im 41/105  soft-tissue]
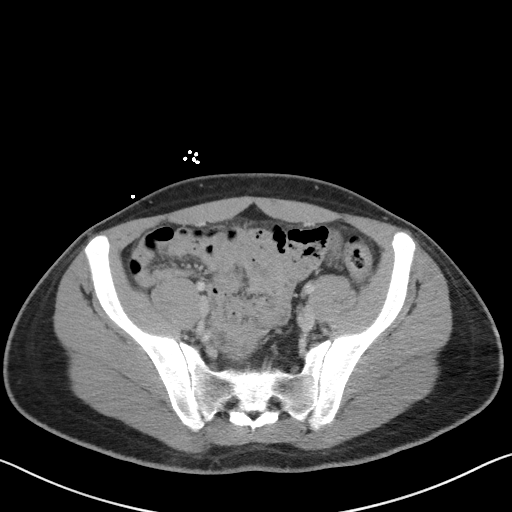
[im 50/105  soft-tissue]
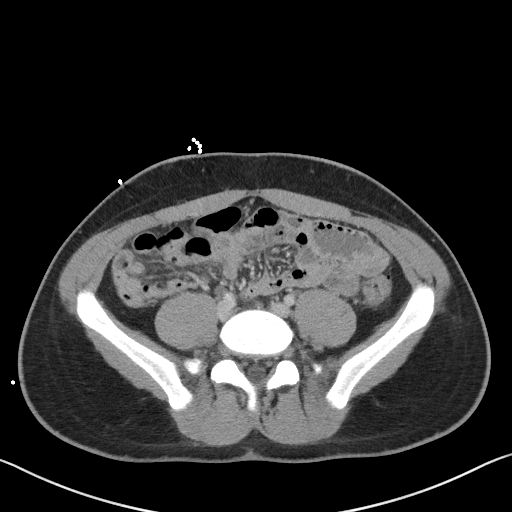
[im 55/105  soft-tissue]
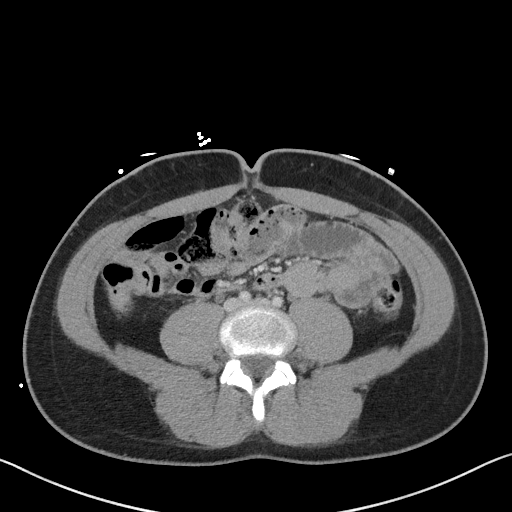
[im 64/105  soft-tissue]
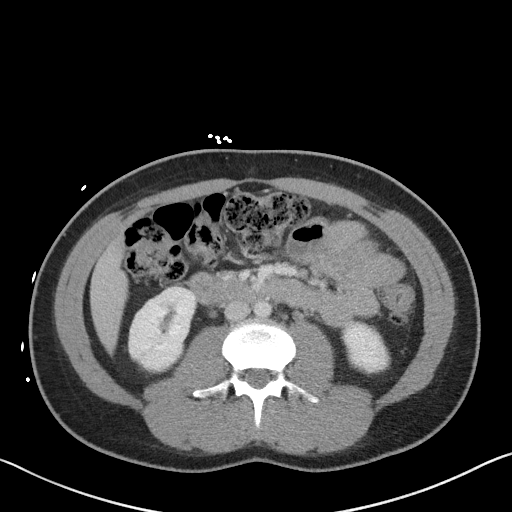
[im 73/105  soft-tissue]
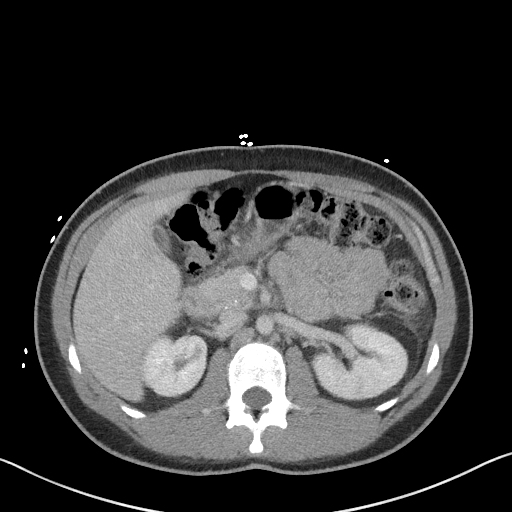
[im 73/105  bone]
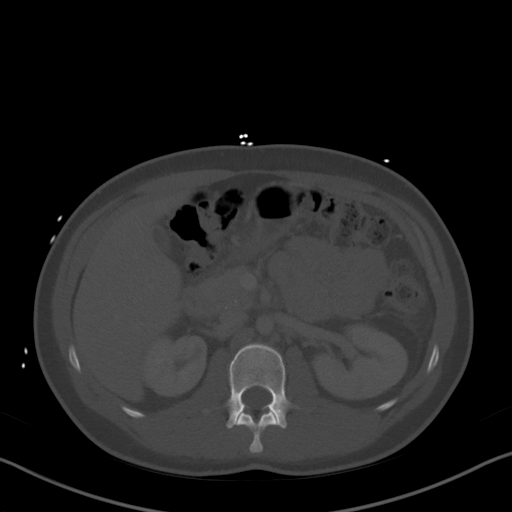
[im 82/105  soft-tissue]
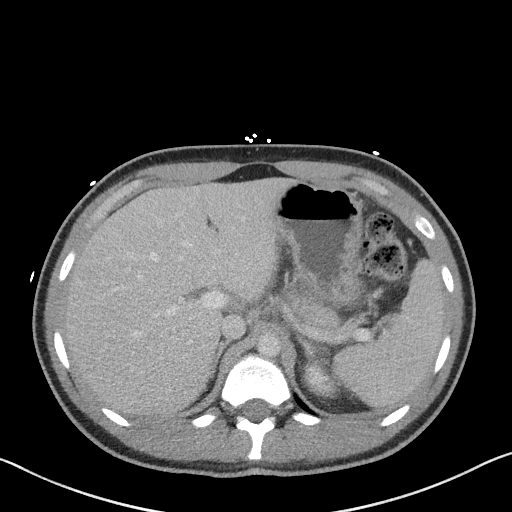
[im 91/105  soft-tissue]
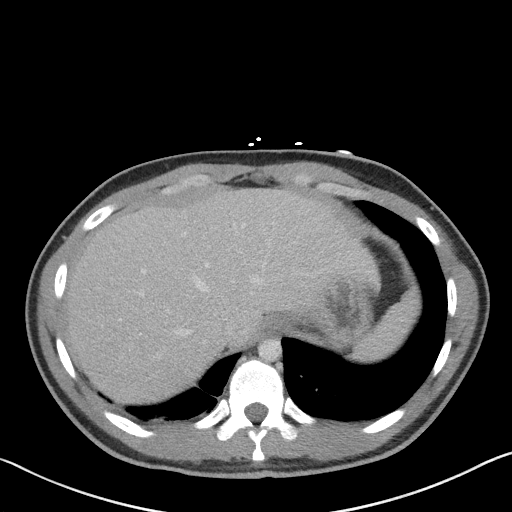
[im 100/105  soft-tissue]
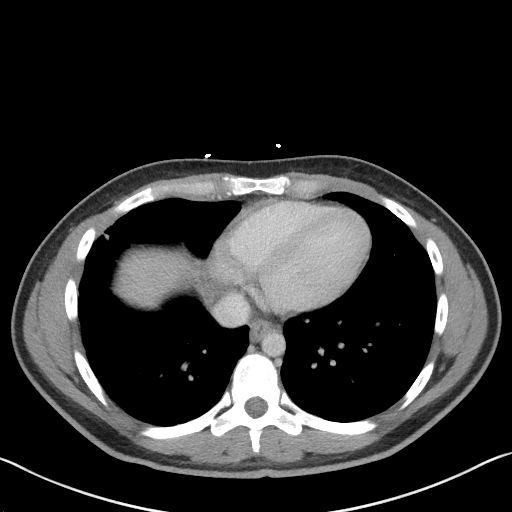

[Series 5: coronal st · coronal · 0.74mm/px · 3 of 78 slices shown]
[im 26/78  soft-tissue]
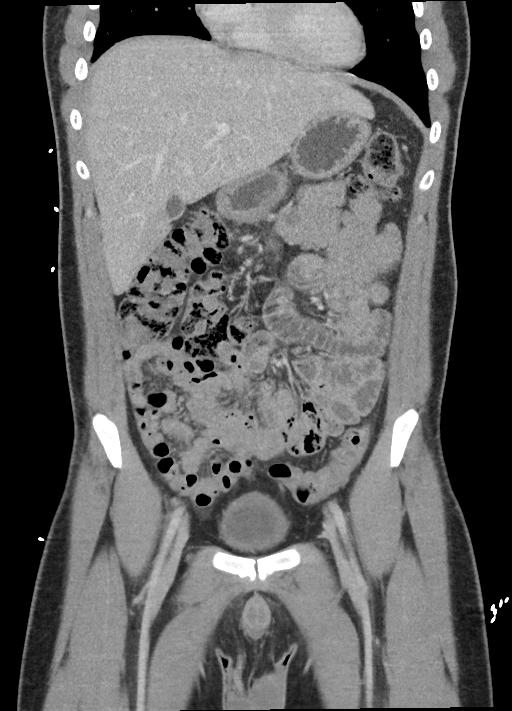
[im 35/78  soft-tissue]
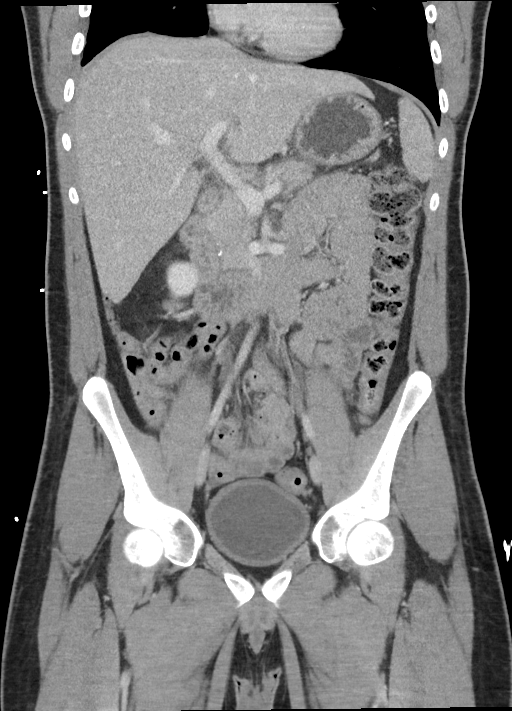
[im 43/78  soft-tissue]
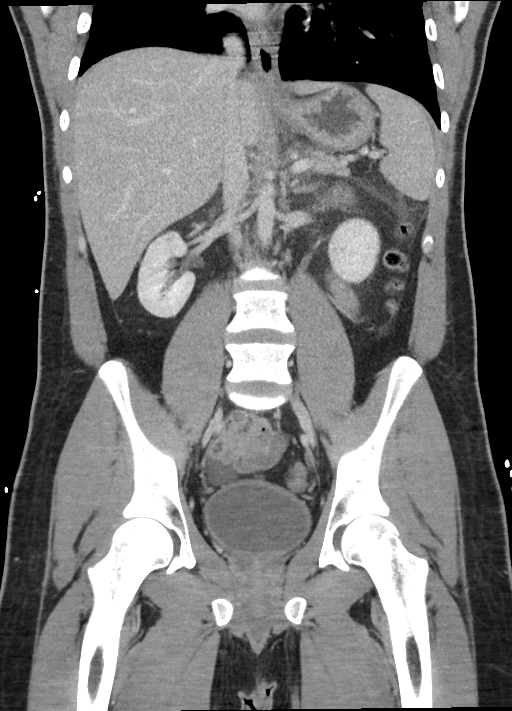

[15 of 46 positions shown; findings below may reference images not displayed]

RADIATION DOSE REDUCTION: This exam was performed according to the
departmental dose-optimization program which includes automated
exposure control, adjustment of the mA and/or kV according to
patient size and/or use of iterative reconstruction technique.

CONTRAST:  100mL OMNIPAQUE IOHEXOL 300 MG/ML  SOLN
FINDINGS: Lower chest: Mild right basilar scarring/atelectasis.

Hepatobiliary: Liver is within normal limits.

Gallbladder is decompressed. No intrahepatic or extrahepatic duct
dilatation.

Pancreas: Mild peripancreatic inflammatory stranding, reflecting
acute pancreatitis. No drainable fluid collection/walled-off
necrosis. Parenchymal calcifications along the posterior aspect of
the pancreatic head/uncinate process, reflecting sequela of
prior/chronic pancreatitis. No pancreatic ductal dilatation.

Spleen: Within normal limits.

Adrenals/Urinary Tract: Adrenal glands are within normal limits.

Heterogeneous perfusion of the kidneys bilaterally, nonspecific but
at least raising the possibility of pyelonephritis. No
hydronephrosis.

Thick-walled bladder.

Stomach/Bowel: Stomach is within normal limits.

No evidence of bowel obstruction.

Normal appendix (series 2/image 46).

No colonic wall thickening or inflammatory changes.

Vascular/Lymphatic: No evidence of abdominal aortic aneurysm.

No suspicious abdominopelvic lymphadenopathy.

Reproductive: Prostate is unremarkable.

Other: Small volume pelvic ascites.

Musculoskeletal: Visualized osseous structures are within normal
limits.
IMPRESSION: Acute on chronic pancreatitis.  No evidence of complication.

Thick-walled bladder, correlate for cystitis.

Heterogeneous perfusion of the kidneys bilaterally, nonspecific, but
raising the possibility of pyelonephritis.

## 2022-12-27 MED ORDER — GABAPENTIN 300 MG PO CAPS
300.0000 mg | ORAL_CAPSULE | Freq: Once | ORAL | Status: AC
  Administered 2022-12-27: 300 mg via ORAL
  Filled 2022-12-27: qty 1

## 2022-12-27 MED ORDER — CHLORHEXIDINE GLUCONATE CLOTH 2 % EX PADS: 6.0000 | MEDICATED_PAD | Freq: Every day | CUTANEOUS | Status: DC

## 2022-12-27 MED ORDER — DEXTROSE 50 % IV SOLN: 0.0000 mL | INTRAVENOUS | Status: DC | PRN

## 2022-12-27 MED ORDER — HYDROMORPHONE HCL 1 MG/ML IJ SOLN
0.5000 mg | INTRAMUSCULAR | Status: DC | PRN
  Administered 2022-12-28 – 2022-12-29 (×8): 0.5 mg via INTRAVENOUS
  Filled 2022-12-27 (×4): qty 1
  Filled 2022-12-27: qty 0.5
  Filled 2022-12-27 (×3): qty 1

## 2022-12-27 MED ORDER — DEXTROSE IN LACTATED RINGERS 5 % IV SOLN: INTRAVENOUS | Status: DC

## 2022-12-27 MED ORDER — HYDROMORPHONE HCL 1 MG/ML IJ SOLN
0.5000 mg | INTRAMUSCULAR | Status: DC | PRN
  Administered 2022-12-27: 0.5 mg via INTRAVENOUS
  Filled 2022-12-27: qty 1

## 2022-12-27 MED ORDER — FLUTICASONE PROPIONATE 50 MCG/ACT NA SUSP
1.0000 | Freq: Every day | NASAL | Status: DC
  Administered 2022-12-27 – 2022-12-29 (×3): 1 via NASAL
  Filled 2022-12-27: qty 16

## 2022-12-27 MED ORDER — ENOXAPARIN SODIUM 40 MG/0.4ML IJ SOSY: 40.0000 mg | PREFILLED_SYRINGE | INTRAMUSCULAR | Status: DC

## 2022-12-27 MED ORDER — ACETAMINOPHEN 325 MG PO TABS
650.0000 mg | ORAL_TABLET | Freq: Four times a day (QID) | ORAL | Status: DC | PRN
  Administered 2022-12-27 – 2022-12-28 (×4): 650 mg via ORAL
  Filled 2022-12-27 (×4): qty 2

## 2022-12-27 MED ORDER — PNEUMOCOCCAL 20-VAL CONJ VACC 0.5 ML IM SUSY: 0.5000 mL | PREFILLED_SYRINGE | INTRAMUSCULAR | Status: DC | PRN

## 2022-12-27 MED ORDER — ONDANSETRON HCL 4 MG/2ML IJ SOLN
4.0000 mg | Freq: Four times a day (QID) | INTRAMUSCULAR | Status: DC | PRN
  Filled 2022-12-27: qty 2

## 2022-12-27 MED ORDER — FAMOTIDINE IN NACL 20-0.9 MG/50ML-% IV SOLN
INTRAVENOUS | Status: AC
  Administered 2022-12-27: 20 mg via INTRAVENOUS
  Filled 2022-12-27: qty 50

## 2022-12-27 MED ORDER — ORAL CARE MOUTH RINSE: 15.0000 mL | OROMUCOSAL | Status: DC | PRN

## 2022-12-27 MED ORDER — GUAIFENESIN 100 MG/5ML PO LIQD
5.0000 mL | ORAL | Status: DC | PRN
  Administered 2022-12-27 – 2022-12-29 (×6): 5 mL via ORAL
  Filled 2022-12-27 (×6): qty 10

## 2022-12-27 MED ORDER — ALBUTEROL SULFATE (2.5 MG/3ML) 0.083% IN NEBU
2.5000 mg | INHALATION_SOLUTION | Freq: Four times a day (QID) | RESPIRATORY_TRACT | Status: DC | PRN
  Administered 2022-12-27: 2.5 mg via RESPIRATORY_TRACT
  Filled 2022-12-27: qty 3

## 2022-12-27 MED ORDER — SODIUM CHLORIDE 0.9 % IV SOLN
500.0000 mg | INTRAVENOUS | Status: DC
  Administered 2022-12-27: 500 mg via INTRAVENOUS
  Filled 2022-12-27: qty 5

## 2022-12-27 MED ORDER — LACTATED RINGERS IV SOLN: INTRAVENOUS | Status: DC

## 2022-12-27 MED ORDER — HYDROMORPHONE HCL 1 MG/ML IJ SOLN
0.5000 mg | INTRAMUSCULAR | Status: AC | PRN
  Administered 2022-12-27 (×2): 0.5 mg via INTRAVENOUS
  Filled 2022-12-27 (×2): qty 1

## 2022-12-27 MED ORDER — CHLORHEXIDINE GLUCONATE CLOTH 2 % EX PADS
6.0000 | MEDICATED_PAD | Freq: Every day | CUTANEOUS | Status: DC
  Administered 2022-12-27 – 2022-12-29 (×2): 6 via TOPICAL

## 2022-12-27 MED ORDER — POTASSIUM CHLORIDE CRYS ER 20 MEQ PO TBCR
40.0000 meq | EXTENDED_RELEASE_TABLET | Freq: Once | ORAL | Status: AC
  Administered 2022-12-27: 40 meq via ORAL
  Filled 2022-12-27: qty 2

## 2022-12-27 MED ORDER — IOHEXOL 350 MG/ML SOLN
75.0000 mL | Freq: Once | INTRAVENOUS | Status: AC | PRN
  Administered 2022-12-27: 75 mL via INTRAVENOUS

## 2022-12-27 MED ORDER — SODIUM CHLORIDE 0.9 % IV SOLN
2.0000 g | INTRAVENOUS | Status: DC
  Administered 2022-12-27 – 2022-12-29 (×3): 2 g via INTRAVENOUS
  Filled 2022-12-27 (×3): qty 20

## 2022-12-27 MED ORDER — KETOROLAC TROMETHAMINE 15 MG/ML IJ SOLN
15.0000 mg | Freq: Once | INTRAMUSCULAR | Status: AC | PRN
  Administered 2022-12-27: 15 mg via INTRAVENOUS
  Filled 2022-12-27: qty 1

## 2022-12-27 MED ORDER — LEVOTHYROXINE SODIUM 50 MCG PO TABS
175.0000 ug | ORAL_TABLET | Freq: Every day | ORAL | Status: DC
  Administered 2022-12-27 – 2022-12-30 (×4): 175 ug via ORAL
  Filled 2022-12-27 (×4): qty 1

## 2022-12-27 MED ORDER — ENOXAPARIN SODIUM 40 MG/0.4ML IJ SOSY
40.0000 mg | PREFILLED_SYRINGE | INTRAMUSCULAR | Status: DC
  Administered 2022-12-27 – 2022-12-30 (×4): 40 mg via SUBCUTANEOUS
  Filled 2022-12-27 (×4): qty 0.4

## 2022-12-27 MED ORDER — ALUM & MAG HYDROXIDE-SIMETH 200-200-20 MG/5ML PO SUSP
30.0000 mL | Freq: Once | ORAL | Status: AC | PRN
  Administered 2022-12-27: 30 mL via ORAL
  Filled 2022-12-27: qty 30

## 2022-12-27 MED ORDER — FAMOTIDINE IN NACL 20-0.9 MG/50ML-% IV SOLN: 20.0000 mg | Freq: Once | INTRAVENOUS | Status: AC

## 2022-12-27 NOTE — H&P (Signed)
History and Physical    David Ramsey ZOX:[id number] DOB: [birth year] DOA: 12/26/2022  PCP: [name], NP   Patient coming from: Home   Chief Complaint: N/V, fatigue, abdominal pain, SOB   HPI: David Ramsey is a 27 y.o. male with medical history significant for type 1 diabetes mellitus, hypothyroidism, and diabetic polyneuropathy who presents to the emergency department with abdominal discomfort, nausea, vomiting, fatigue, and shortness of breath.  Patient states that his wife and daughter recently had an upper respiratory illness.  Patient himself developed cough, congestion, and shortness of breath as well.  While the respiratory symptoms have been improving, he has developed fatigue, nausea, nonbloody vomiting, and abdominal discomfort reports adherence with his insulin but notes that his regimen was recently changed to his sugars have been more elevated since then.  ED Course: Upon arrival to the ED, patient is found to be afebrile and saturating well on room air with elevated heart rate and stable blood pressure.  EKG demonstrates sinus rhythm.  Chest x-ray notable for slight elevation of the right hemidiaphragm and mild peribronchial thickening.  Right upper quadrant ultrasound is a normal study.  Labs are most notable for glucose 517, potassium 5.6, bicarbonate 13, creatinine 1.90, WBC 23,000, platelets 514,000, and lactic acid 3.5.  Blood culture was collected and patient was given 2.5 L of IV fluids, Reglan, Benadryl, Zofran, acetaminophen, DuoNeb, and was started on insulin infusion in the ED.  Review of Systems:  All other systems reviewed and apart from HPI, are negative.  Past Medical History:  Diagnosis Date   AKI (acute kidney injury) 12/16/2016   Diabetes mellitus without complication    INSULIN DEPENDENT   SINCE AGE 14   Diabetic polyneuropathy associated with type 1 diabetes mellitus 11/30/2021   DKA (diabetic ketoacidoses) 02/17/2017   Hashimoto's disease    MRSA  infection    Pneumonia    Liver contusion, lung effusion, sepsis multiorgan failure, no clear source    Past Surgical History:  Procedure Laterality Date   CHEST TUBE INSERTION      Social History:   reports that he quit smoking about 5 years ago. His smoking use included cigarettes. He has a 2.00 pack-year smoking history. He has never used smokeless tobacco. He reports current alcohol use. He reports that he does not use drugs.  Allergies  Allergen Reactions   Tape Rash    Blisters- can use kerlix tape    Family History  Problem Relation Age of Onset   Diabetes Cousin    Heart disease Other    Cancer Other    Thyroid disease Other    Healthy Mother      Prior to Admission medications   Medication Sig Start Date End Date Taking? Authorizing Provider  acetaminophen-codeine (TYLENOL #3) 300-30 MG tablet Take 1 tablet by mouth at bedtime as needed for moderate pain. 12/20/22   Cristie Hem, PA-C  albuterol (VENTOLIN HFA) 108 (90 Base) MCG/ACT inhaler Inhale 2 puffs into the lungs every 6 (six) hours as needed for wheezing or shortness of breath. 11/08/21   Alford Highland, MD  blood glucose meter kit and supplies KIT Dispense based on patient and insurance preference. Use up to four times daily as directed. 11/03/20   Marguerita Merles Latif, DO  FLUoxetine (PROZAC) 40 MG capsule Take 1 capsule (40 mg total) by mouth daily. 03/22/22   Hollice Espy, MD  HYDROcodone-acetaminophen (NORCO) 5-325 MG tablet Take 1 tablet by mouth at bedtime as  needed. 11/03/22   Tarry Kos, MD  insulin detemir (LEVEMIR FLEXTOUCH) 100 UNIT/ML FlexPen Inject 15 Units into the skin 2 (two) times daily. 08/24/22   [name], NP  insulin lispro (HUMALOG KWIKPEN) 200 UNIT/ML KwikPen Take 3 times a day with meals.  1 unit for CBG 121-150, 2 units for 151-200, 3 units for 201-250, 5 units for 251-300, 7 units for 301-350, 9 units for 351-400. 08/24/22   [name], NP  levothyroxine (SYNTHROID) 175  MCG tablet Take 1 tablet (175 mcg total) by mouth daily. 08/24/22   [name], NP  naproxen (NAPROSYN) 500 MG tablet Take 1 tablet (500 mg total) by mouth 2 (two) times daily with a meal. 08/18/22   Jene Every, MD  ondansetron (ZOFRAN-ODT) 4 MG disintegrating tablet Take 1 tablet (4 mg total) by mouth every 8 (eight) hours as needed for nausea or vomiting. 08/24/22   [name], NP  pantoprazole (PROTONIX) 40 MG tablet Take 1 tablet (40 mg total) by mouth daily. 08/24/22 09/23/22  [name], NP  pregabalin (LYRICA) 75 MG capsule Take 1 capsule (75 mg total) by mouth daily. 11/24/22 12/24/22  [name], NP  traMADol (ULTRAM) 50 MG tablet Take 1-2 tablets (50-100 mg total) by mouth daily as needed. 10/06/22   Cristie Hem, PA-C    Physical Exam: Vitals:   12/26/22 1911 12/26/22 2326 12/27/22 0005 12/27/22 0246  BP:   (!) 144/85 139/77  Pulse:   (!) 119 (!) 106  Resp:   (!) 29 (!) 25  Temp:  97.7 F (36.5 C)  99 F (37.2 C)  TempSrc:  Oral  Oral  SpO2:   99% 93%  Weight: 74.8 kg   75.5 kg  Height:     (1.676 m)    Constitutional: NAD, no pallor or diaphoresis   Eyes: PERTLA, lids and conjunctivae normal ENMT: Mucous membranes are moist. Posterior pharynx clear of any exudate or lesions.   Neck: supple, no masses  Respiratory: no wheezing, no crackles. No accessory muscle use.  Cardiovascular: S1 & S2 heard, regular rate and rhythm. No extremity edema.   Abdomen: Soft, no distension, tender in upper abdomen without rebound pain or guarding. Bowel sounds active.  Musculoskeletal: no clubbing / cyanosis. No joint deformity upper and lower extremities.   Skin: no significant rashes, lesions, ulcers. Warm, dry, well-perfused. Neurologic: CN 2-12 grossly intact. Moving all extremities. Alert and oriented.  Psychiatric: Pleasant. Cooperative.    Labs and Imaging on Admission: I have personally reviewed following labs and imaging studies  CBC: Recent Labs  Lab  12/26/22 1959 12/26/22 2006  WBC 23.0*  --   NEUTROABS 19.2*  --   HGB 15.2 16.7  HCT 45.5 49.0  MCV 89.7  --   PLT 514*  --    Basic Metabolic Panel: Recent Labs  Lab 12/26/22 1959 12/26/22 2006  NA 128* 127*  K 5.5* 5.6*  CL 90* 99  CO2 10*  --   GLUCOSE 495* 517*  BUN 20 24*  CREATININE 1.90* 1.40*  CALCIUM 9.5  --    GFR: Estimated Creatinine Clearance: 72.8 mL/min (A) (by C-G formula based on SCr of 1.4 mg/dL (H)). Liver Function Tests: Recent Labs  Lab 12/26/22 1959  AST 23  ALT 19  ALKPHOS 124  BILITOT 1.7*  PROT 8.5*  ALBUMIN 3.7   Recent Labs  Lab 12/26/22 0047  LIPASE 29   No results for input(s): "AMMONIA"  in the last 168 hours. Coagulation Profile: No results for input(s): "INR", "PROTIME" in the last 168 hours. Cardiac Enzymes: No results for input(s): "CKTOTAL", "CKMB", "CKMBINDEX", "TROPONINI" in the last 168 hours. BNP (last 3 results) No results for input(s): "PROBNP" in the last 8760 hours. HbA1C: No results for input(s): "HGBA1C" in the last 72 hours. CBG: Recent Labs  Lab 12/26/22 2253 12/26/22 2331 12/27/22 0020 12/27/22 0125 12/27/22 0241  GLUCAP 433* 404* 319* 281* 256*   Lipid Profile: No results for input(s): "CHOL", "HDL", "LDLCALC", "TRIG", "CHOLHDL", "LDLDIRECT" in the last 72 hours. Thyroid Function Tests: No results for input(s): "TSH", "T4TOTAL", "FREET4", "T3FREE", "THYROIDAB" in the last 72 hours. Anemia Panel: No results for input(s): "VITAMINB12", "FOLATE", "FERRITIN", "TIBC", "IRON", "RETICCTPCT" in the last 72 hours. Urine analysis:    Component Value Date/Time   COLORURINE STRAW (A) 12/26/2022 1917   APPEARANCEUR CLEAR 12/26/2022 1917   LABSPEC 1.018 12/26/2022 1917   PHURINE 5.0 12/26/2022 1917   GLUCOSEU >=500 (A) 12/26/2022 1917   HGBUR SMALL (A) 12/26/2022 1917   BILIRUBINUR NEGATIVE 12/26/2022 1917   BILIRUBINUR neg 03/16/2022 1337   KETONESUR 80 (A) 12/26/2022 1917   PROTEINUR 30 (A) 12/26/2022  1917   UROBILINOGEN 0.2 03/16/2022 1337   NITRITE NEGATIVE 12/26/2022 1917   LEUKOCYTESUR NEGATIVE 12/26/2022 1917   Sepsis Labs: @LABRCNTIP (procalcitonin:4,lacticidven:4) ) Recent Results (from the past 240 hour(s))  Resp panel by RT-PCR (RSV, Flu A&B, Covid) Anterior Nasal Swab     Status: None   Collection Time: 12/26/22  7:17 PM   Specimen: Anterior Nasal Swab  Result Value Ref Range Status   SARS Coronavirus 2 by RT PCR NEGATIVE NEGATIVE Final    Comment: (NOTE) SARS-CoV-2 target nucleic acids are NOT DETECTED.  The SARS-CoV-2 RNA is generally detectable in upper respiratory specimens during the acute phase of infection. The lowest concentration of SARS-CoV-2 viral copies this assay can detect is 138 copies/mL. A negative result does not preclude SARS-Cov-2 infection and should not be used as the sole basis for treatment or other patient management decisions. A negative result may occur with  improper specimen collection/handling, submission of specimen other than nasopharyngeal swab, presence of viral mutation(s) within the areas targeted by this assay, and inadequate number of viral copies(<138 copies/mL). A negative result must be combined with clinical observations, patient history, and epidemiological information. The expected result is Negative.  Fact Sheet for Patients:  BloggerCourse.com  Fact Sheet for Healthcare Providers:  SeriousBroker.it  This test is no t yet approved or cleared by the [place] FDA and  has been authorized for detection and/or diagnosis of SARS-CoV-2 by FDA under an Emergency Use Authorization (EUA). This EUA will remain  in effect (meaning this test can be used) for the duration of the COVID-19 declaration under Section 564(b)(1) of the Act, 21 U.S.C.section 360bbb-3(b)(1), unless the authorization is terminated  or revoked sooner.       Influenza A by PCR NEGATIVE NEGATIVE Final    Influenza B by PCR NEGATIVE NEGATIVE Final    Comment: (NOTE) The Xpert Xpress SARS-CoV-2/FLU/RSV plus assay is intended as an aid in the diagnosis of influenza from Nasopharyngeal swab specimens and should not be used as a sole basis for treatment. Nasal washings and aspirates are unacceptable for Xpert Xpress SARS-CoV-2/FLU/RSV testing.  Fact Sheet for Patients: BloggerCourse.com  Fact Sheet for Healthcare Providers: SeriousBroker.it  This test is not yet approved or cleared by the [place] FDA and has been authorized for detection and/or diagnosis  of SARS-CoV-2 by FDA under an Emergency Use Authorization (EUA). This EUA will remain in effect (meaning this test can be used) for the duration of the COVID-19 declaration under Section 564(b)(1) of the Act, 21 U.S.C. section 360bbb-3(b)(1), unless the authorization is terminated or revoked.     Resp Syncytial Virus by PCR NEGATIVE NEGATIVE Final    Comment: (NOTE) Fact Sheet for Patients: BloggerCourse.com  Fact Sheet for Healthcare Providers: SeriousBroker.it  This test is not yet approved or cleared by the [place] FDA and has been authorized for detection and/or diagnosis of SARS-CoV-2 by FDA under an Emergency Use Authorization (EUA). This EUA will remain in effect (meaning this test can be used) for the duration of the COVID-19 declaration under Section 564(b)(1) of the Act, 21 U.S.C. section 360bbb-3(b)(1), unless the authorization is terminated or revoked.  Performed at [facility], [address] [place], [place] [address]      Radiological Exams on Admission: US Abdomen Limited RUQ (LIVER/GB)  Result Date: 12/26/2022 CLINICAL DATA:  Right upper quadrant abdominal pain EXAM: ULTRASOUND ABDOMEN LIMITED RIGHT UPPER QUADRANT COMPARISON:  MRI 04/13/2022 FINDINGS: Gallbladder: No  gallstones or wall thickening visualized. No sonographic Murphy sign noted by sonographer. Common bile duct: Diameter: 3 mm in proximal diameter Liver: No focal lesion identified. Within normal limits in parenchymal echogenicity. Portal vein is patent on color Doppler imaging with normal direction of blood flow towards the liver. Other: None. IMPRESSION: 1. Normal right upper quadrant sonogram. Electronically Signed   By: Helyn Numbers M.D.   On: 12/26/2022 23:50   DG Chest Portable 1 View  Result Date: 12/26/2022 CLINICAL DATA:  Cough and sepsis EXAM: PORTABLE CHEST 1 VIEW COMPARISON:  X-ray 04/11/2022 and older FINDINGS: Slight elevation of the right hemidiaphragm along its lateral aspect. No consolidation, pneumothorax or edema. Mild peribronchial thickening on the left. Normal cardiopericardial silhouette. Overlapping cardiac leads IMPRESSION: Slight elevation of the right hemidiaphragm. Mild peribronchial thickening on the left.  No consolidation Electronically Signed   By: Karen Kays M.D.   On: 12/26/2022 21:44    EKG: Independently reviewed. Sinus rhythm.   Assessment/Plan   1. DKA  - Most recent A1c was 10.0% in July 2023  - Given fluid bolus and started in IV insulin infusion in ED  - Continue IVF hydration, continue insulin infusion with frequent CBGs and serial chem panels    2. SIRS  - Multiple SIRS criteria and elevated lactate noted without evidence for bacterial infection  - Blood culture collected in ED  - Follow culture, repeat lactate and CBC in am, watch off of antibiotics for now  3. AKI  - SCr is 1.90 on admission, up from baseline closer to 1  - Likely prerenal in setting of N/V and osmotic diuresis  - Continue IVF hydration, renally-dose medications, repeat chem panel    4. Hyperkalemia  - Potassium is 5.6 in setting of AKI and insulin deficiency  - Continue IVF and insulin infusion, repeat chem panel    5. Hypothyroidism  - Continue Synthroid     DVT  prophylaxis: Lovenox  Code Status: Full  Level of Care: Level of care: Stepdown Family Communication: None present  Disposition Plan:  Patient is from: home  Anticipated d/c is to: home Anticipated d/c date is: 12/29/22  Patient currently: pending glycemic-control, stable renal function  Consults called: None  Admission status: Inpatient     Briscoe Deutscher, MD [facility]  12/27/2022, 3:05 AM

## 2022-12-27 NOTE — Progress Notes (Signed)
Started patient's azithromycin. Approximately 30 minutes later, patient began complaining of a burning and stabbing in his upper arm. Antibiotics paused, and IV patency checked. IV flushed well, and decreased patient's discomfort, but unable to get blood return. IV removed in case of possible infiltration or malposition, catheter was intact. New IV established, but patient had the same reaction immediately upon starting the abx, complaining of itching and burning directly above the IV site. Garner Nash, NP notified of possible reaction and antibiotics were stopped. IV flushed with relief, and Pepcid IVPB administered. During events, patient began to clutch his chest. Patient having similar symptoms as earlier in the shift. Troponin drawn with new IV start. Patient suddenly began having episodes of somnolence, falling asleep and snoring during conversation. VBG also drawn with troponin per order. Patient placed on EtCO2 monitor. Azithromycin added to patient's allergy list. Patient declines history of reactions to antibiotics or allergies to them.

## 2022-12-27 NOTE — Hospital Course (Signed)
27 y.o. male with medical history significant for type 1 diabetes mellitus, hypothyroidism, and diabetic polyneuropathy who presents to the emergency department with abdominal discomfort, nausea, vomiting, fatigue, and shortness of breath.   Patient states that his wife and daughter recently had an upper respiratory illness.  Patient himself developed cough, congestion, and shortness of breath as well.  While the respiratory symptoms have been improving, he has developed fatigue, nausea, nonbloody vomiting, and abdominal discomfort reports adherence with his insulin but notes that his regimen was recently changed to his sugars have been more elevated since then.   ED Course: Upon arrival to the ED, patient is found to be afebrile and saturating well on room air with elevated heart rate and stable blood pressure.  EKG demonstrates sinus rhythm.  Chest x-ray notable for slight elevation of the right hemidiaphragm and mild peribronchial thickening.  Right upper quadrant ultrasound is a normal study.  Labs are most notable for glucose 517, potassium 5.6, bicarbonate 13, creatinine 1.90, WBC 23,000, platelets 514,000, and lactic acid 3.5.   Blood culture was collected and patient was given 2.5 L of IV fluids, Reglan, Benadryl, Zofran, acetaminophen, DuoNeb, and was started on insulin infusion in the ED.  Patient was found to be in DKA which was initially managed with insulin infusion and later transition to basal and short-acting once anion gap closes and beta-hydroxybutyrate acid normalized.  A1c was found to be elevated at 10.4.  Patient was apparently struggling to find an appointment with endocrinologist.  Hopefully his PCP should be able to help for better control of his diabetes.  Patient was also given prescription for Dexcom.  He was instructed to continue taking basal insulin at 15 units twice daily as prescribed before, he was apparently taking it just once daily.  He need to continue with  short-acting insulin.  He was also found to be positive for metapneumovirus and CT chest notable for pneumonia.  His wife and daughter was also sick for similar symptoms.  He received Rocephin and doxycycline while in the hospital and discharged on Augmentin and doxycycline for 2 more days.  Patient did had AKI on admission secondary to dehydration which improved with IV fluid.  His hyperkalemia resolved and potassium normalized.  Patient did had pseudo hyponatremia secondary to hyperglycemia.  Corrected sodium was normal.  Patient was instructed to continue using his insulin regularly and need to have a close follow-up with PCP.  PCP should be able to find a close appointment with endocrinologist to get established for better management of his type 1 diabetes.

## 2022-12-27 NOTE — Progress Notes (Signed)
Progress Note   Patient: David Ramsey XLK:[id number] DOB: 09/09/1995 DOA: 12/26/2022     0 DOS: the patient was seen and examined on 12/27/2022   Brief hospital course: 27 y.o. male with medical history significant for type 1 diabetes mellitus, hypothyroidism, and diabetic polyneuropathy who presents to the emergency department with abdominal discomfort, nausea, vomiting, fatigue, and shortness of breath.   Patient states that his wife and daughter recently had an upper respiratory illness.  Patient himself developed cough, congestion, and shortness of breath as well.  While the respiratory symptoms have been improving, he has developed fatigue, nausea, nonbloody vomiting, and abdominal discomfort reports adherence with his insulin but notes that his regimen was recently changed to his sugars have been more elevated since then.   ED Course: Upon arrival to the ED, patient is found to be afebrile and saturating well on room air with elevated heart rate and stable blood pressure.  EKG demonstrates sinus rhythm.  Chest x-ray notable for slight elevation of the right hemidiaphragm and mild peribronchial thickening.  Right upper quadrant ultrasound is a normal study.  Labs are most notable for glucose 517, potassium 5.6, bicarbonate 13, creatinine 1.90, WBC 23,000, platelets 514,000, and lactic acid 3.5.   Blood culture was collected and patient was given 2.5 L of IV fluids, Reglan, Benadryl, Zofran, acetaminophen, DuoNeb, and was started on insulin infusion in the ED.  Assessment and Plan: 1. DKA  - Most recent A1c was 10.0% in July 2023  -Presenting anion gap in excess of 28 with glucose of 511 -pt is continued on insulin gtt and IVF - Anion gap is improving, however had improved again to 16 -Continue to follow serial bmet trends and b-hydroxybutyric acid levels   2. SIRS  - Multiple SIRS criteria and elevated lactate noted without evidence for bacterial infection  - Blood culture collected in  ED  - continue off abx for now -Cont IVF as tolerated   3. AKI  - SCr is 1.90 on admission, up from baseline closer to 1  - Likely prerenal in setting of N/V and osmotic diuresis  - Renal function is improving with IVF   4. Hyperkalemia  - Potassium is 5.6 in setting of AKI and insulin deficiency  - Continue IVF and insulin infusion -K normalized   5. Hypothyroidism  - Continue Synthroid    6. Hyponatremia -improving with IVF -Continue to follow bmet trends  7. Chest pain -Suspect musculoskeletal in the setting of presumed URI -continue with analgesia as needed -EKG changes noted with T wave abnormalities noted. Chart reviewed. Pt had prior similar EKG one year prior, suspecting electrolyte abnormalities and acidosis -following serial trop  8. URI -positive sick contacts in family -covid and flu neg. ] -respiratory viral panel pending   Subjective: Complaining of generalized chest pains and congestion with coughing  Physical Exam: Vitals:   12/27/22 1100 12/27/22 1200 12/27/22 1300 12/27/22 1400  BP: 132/86 131/83 125/74 120/68  Pulse: 92 95 96 95  Resp: (!) 24 (!) Temp:      TempSrc:      SpO2: 96% 96% 93% 93%  Weight:      Height:       General exam: Awake, laying in bed, in nad Respiratory system: Normal respiratory effort, no wheezing Cardiovascular system: regular rate, s1, s2 Gastrointestinal system: Soft, nondistended, positive BS Central nervous system: CN2-12 grossly intact, strength intact Extremities: Perfused, no clubbing Skin: Normal skin turgor, no notable skin  lesions seen Psychiatry: Mood normal // no visual hallucinations   Data Reviewed:  Labs reviewed: Na 134, K 3.9, Cr 1.09, Trop 82 - 95, B hydroxybutyric acid 1.1  Family Communication: Pt in room, family not at bedside  Disposition: Status is: Inpatient Remains inpatient appropriate because: Severity of illness  Planned Discharge Destination: Home    Author: Rickey Barbara, MD 12/27/2022 3:02 PM  For on call review [email or web address].

## 2022-12-27 NOTE — Inpatient Diabetes Management (Signed)
Inpatient Diabetes Program Recommendations  AACE/ADA: New Consensus Statement on Inpatient Glycemic Control (2015)  Target Ranges:  Prepandial:   less than 140 mg/dL      Peak postprandial:   less than 180 mg/dL (1-2 hours)      Critically ill patients:  140 - 180 mg/dL   Lab Results  Component Value Date   GLUCAP 144 (H) 12/27/2022   HGBA1C 10.0 (H) 03/21/2022    Latest Reference Range & Units 12/27/22 06:00  Sodium 135 - 145 mmol/L 129 (L)  Potassium 3.5 - 5.1 mmol/L 3.8  Chloride 98 - 111 mmol/L 101  CO2 22 - 32 mmol/L 17 (L)  Glucose 70 - 99 mg/dL 161 (H)  BUN 6 - 20 mg/dL 18  Creatinine 0.96 - 0.45 mg/dL 4.09 (H)  Calcium 8.9 - 10.3 mg/dL 8.3 (L)  Anion gap 5 - 15  11  (L): Data is abnormally low (H): Data is abnormally high  Diabetes history: DM1 (makes NO insulin; requires basal, correction, and carbohydrate coverage insulin) Outpatient Diabetes medications: Levemir 15 units daily, Humalog 1 unit for every 8 grams of carbs, Humalog 1 unit for every 20 mg/dl above target of 811 mg/dl Current orders for Inpatient glycemic control: IV insulin  Inpatient Diabetes Program Recommendations:   CO2 low @ 17. When ready to transition to subcutaneous insulin, please consider: -Semglee 12 (80 % home basal insulin dose) -Novolog 0-6 units q 4 hrs. While NPO, then add meal coverage as needed  Thank you, Darel Hong E. Neilson Oehlert, RN, MSN, CDE  Diabetes Coordinator Inpatient Glycemic Control Team Team Pager [phone] (8am-5pm) 12/27/2022 1:39 PM

## 2022-12-27 NOTE — Progress Notes (Signed)
       Overnight   NAME: David Ramsey MRN: [id number] DOB : 1996-02-09    Date of Service   12/27/2022   HPI/Events of Note    Notified by RN for concern over possible reaction to Azithromycin 500 mg IV RN noticed immediate reaction on administration along with patient complaint of burning sensation on patent IV.  Due to drowsy state of patient Benadryl is deferred currently due to no obvious or stated difficulty breathing.  Pepcid is ordered.     Interventions/ Plan   Azithromycin stopped Observe for any further difficulty Pepcid ordered Benadryl on hold for now       Chinita Greenland BSN MSNA MSN ACNPC-AG Acute Care Nurse Practitioner [facility]

## 2022-12-27 NOTE — TOC Initial Note (Addendum)
Transition of Care [facility]) - Initial/Assessment Note    Patient Details  Name: David Ramsey MRN: [id number] Date of Birth: 05-Mar-1996  Transition of Care [facility]) CM/SW Contact:    Adrian Prows, RN Phone Number: 12/27/2022, 5:27 PM  Clinical Narrative:                 [facility] consult for d/c planning and SDOH risk; spoke w/ pt; pt says he plans to return home w/ family at d/c; he said he lost his job 6 months ago; pt say family was evicted and are currently living with mother in law; pt says  he has a wife and 4 year-old dtr; he was laid off 6 months he says family has food insecurity; ago; pt says he currently gets 70/30 insulin from [facility] because he cannot afford Humalog because price went from $20 to $200; pt agrees to receive resource for social services; pt also encouraged to contact his PCP to see if meds can be changed to more affordable equivalent; resources placed in d/c instructions; he will make appt at agency of choice; TOC will follow.  Expected Discharge Plan: Home/Self Care Barriers to Discharge: Continued Medical Work up   Patient Goals and CMS Choice Patient states their goals for this hospitalization and ongoing recovery are:: home          Expected Discharge Plan and Services   Discharge Planning Services: CM Consult   Living arrangements for the past 2 months: Single Family Home                                      Prior Living Arrangements/Services Living arrangements for the past 2 months: Single Family Home Lives with:: Spouse, Relatives Patient language and need for interpreter reviewed:: Yes Do you feel safe going back to the place where you live?: Yes      Need for Family Participation in Patient Care: Yes (Comment) Care giver support system in place?: Yes (comment) Current home services:  (n/a) Criminal Activity/Legal Involvement Pertinent to Current Situation/Hospitalization: No - Comment as needed  Activities of Daily Living Home  Assistive Devices/Equipment: CBG Meter ADL Screening (condition at time of admission) Patient's cognitive ability adequate to safely complete daily activities?: Yes Is the patient deaf or have difficulty hearing?: No Does the patient have difficulty seeing, even when wearing glasses/contacts?: No Does the patient have difficulty concentrating, remembering, or making decisions?: No Patient able to express need for assistance with ADLs?: Yes Does the patient have difficulty dressing or bathing?: No Independently performs ADLs?: Yes (appropriate for developmental age) Does the patient have difficulty walking or climbing stairs?: No Weakness of Legs: Both Weakness of Arms/Hands: Both  Permission Sought/Granted Permission sought to share information with : Case Manager Permission granted to share information with : Yes, Verbal Permission Granted  Share Information with NAME: Burnard Bunting, RN, CM     Permission granted to share info w Relationship: Kaveon Blatz (spouse) [phone]     Emotional Assessment Appearance:: Appears stated age Attitude/Demeanor/Rapport: Gracious Affect (typically observed): Accepting Orientation: : Oriented to Self, Oriented to Place, Oriented to  Time, Oriented to Situation Alcohol / Substance Use: Not Applicable Psych Involvement: No (comment)  Admission diagnosis:  DKA (diabetic ketoacidosis) [E11.10] Diabetic ketoacidosis without coma associated with type 1 diabetes mellitus [E10.10] Patient Active Problem List   Diagnosis Date Noted   DKA (diabetic ketoacidosis) 12/27/2022   SIRS (  systemic inflammatory response syndrome) 12/27/2022   DKA, type 1 04/11/2022   Nausea & vomiting 04/11/2022   High anion gap metabolic acidosis 04/11/2022   Essential hypertension 03/21/2022   Chronic pancreatitis 03/21/2022   Overweight (BMI 25.0-29.9) 03/21/2022   Anxiety and depression 03/16/2022   Elevated LFTs 11/30/2021   Abdominal pain 11/30/2021    Hyperkalemia 11/30/2021   Elevated d-dimer 11/07/2021   Diabetic neuropathy 11/06/2021   Acute pancreatitis 11/04/2021   Uncontrolled type 1 diabetes mellitus with hyperglycemia, with long-term current use of insulin 02/17/2017   AKI (acute kidney injury) 12/16/2016   Hypothyroidism 11/01/2014   Non-compliance with treatment 07/02/2014   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   Long-term insulin use 03/03/2012   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 [place] [address] Phone: [phone] Fax: [phone]     Social Determinants of Health (SDOH) Social History: SDOH Screenings   Food Insecurity: Food Insecurity Present (12/27/2022)  Housing: High Risk (12/27/2022)  Transportation Needs: No Transportation Needs (12/27/2022)  Utilities: Not At Risk (12/27/2022)  Depression (PHQ2-9): High Risk (03/16/2022)  Tobacco Use: Medium Risk (12/27/2022)   SDOH Interventions: Food Insecurity Interventions: Inpatient TOC Housing Interventions: Inpatient TOC Utilities Interventions: Inpatient TOC   Readmission Risk Interventions    03/21/2022   11:18 AM  Readmission Risk Prevention Plan  Transportation Screening Complete  PCP or Specialist Appt within 3-5 Days Complete  HRI or Home Care Consult Complete  Social Work Consult for Recovery Care Planning/Counseling Complete  Palliative Care Screening Not Applicable  Medication Review Oceanographer) Complete

## 2022-12-28 ENCOUNTER — Other Ambulatory Visit (HOSPITAL_COMMUNITY): Payer: Self-pay

## 2022-12-28 ENCOUNTER — Inpatient Hospital Stay (HOSPITAL_COMMUNITY): Payer: BC Managed Care – PPO

## 2022-12-28 DIAGNOSIS — M79669 Pain in unspecified lower leg: Secondary | ICD-10-CM

## 2022-12-28 DIAGNOSIS — E1010 Type 1 diabetes mellitus with ketoacidosis without coma: Secondary | ICD-10-CM | POA: Diagnosis not present

## 2022-12-28 LAB — GLUCOSE, CAPILLARY
Glucose-Capillary: 132 mg/dL — ABNORMAL HIGH (ref 70–99)
Glucose-Capillary: 139 mg/dL — ABNORMAL HIGH (ref 70–99)
Glucose-Capillary: 146 mg/dL — ABNORMAL HIGH (ref 70–99)
Glucose-Capillary: 156 mg/dL — ABNORMAL HIGH (ref 70–99)
Glucose-Capillary: 164 mg/dL — ABNORMAL HIGH (ref 70–99)
Glucose-Capillary: 180 mg/dL — ABNORMAL HIGH (ref 70–99)
Glucose-Capillary: 190 mg/dL — ABNORMAL HIGH (ref 70–99)
Glucose-Capillary: 143 mg/dL — ABNORMAL HIGH (ref 70–99)
Glucose-Capillary: 162 mg/dL — ABNORMAL HIGH (ref 70–99)
Glucose-Capillary: 206 mg/dL — ABNORMAL HIGH (ref 70–99)
Glucose-Capillary: 221 mg/dL — ABNORMAL HIGH (ref 70–99)
Glucose-Capillary: 155 mg/dL — ABNORMAL HIGH (ref 70–99)

## 2022-12-28 LAB — BASIC METABOLIC PANEL
Anion gap: 9 (ref 5–15)
Anion gap: 12 (ref 5–15)
Anion gap: 7 (ref 5–15)
BUN: 10 mg/dL (ref 6–20)
BUN: 9 mg/dL (ref 6–20)
BUN: 8 mg/dL (ref 6–20)
CO2: 21 mmol/L — ABNORMAL LOW (ref 22–32)
CO2: 19 mmol/L — ABNORMAL LOW (ref 22–32)
CO2: 21 mmol/L — ABNORMAL LOW (ref 22–32)
Calcium: 8.1 mg/dL — ABNORMAL LOW (ref 8.9–10.3)
Calcium: 8.2 mg/dL — ABNORMAL LOW (ref 8.9–10.3)
Calcium: 8.2 mg/dL — ABNORMAL LOW (ref 8.9–10.3)
Chloride: 105 mmol/L (ref 98–111)
Chloride: 103 mmol/L (ref 98–111)
Chloride: 102 mmol/L (ref 98–111)
Creatinine, Ser: 0.95 mg/dL (ref 0.61–1.24)
Creatinine, Ser: 0.91 mg/dL (ref 0.61–1.24)
Creatinine, Ser: 1.04 mg/dL (ref 0.61–1.24)
GFR, Estimated: >60 mL/min (ref >60)
GFR, Estimated: >60 mL/min (ref >60)
GFR, Estimated: >60 mL/min (ref >60)
Glucose, Bld: 169 mg/dL — ABNORMAL HIGH (ref 70–99)
Glucose, Bld: 157 mg/dL — ABNORMAL HIGH (ref 70–99)
Glucose, Bld: 186 mg/dL — ABNORMAL HIGH (ref 70–99)
Potassium: 3.9 mmol/L (ref 3.5–5.1)
Potassium: 4 mmol/L (ref 3.5–5.1)
Potassium: 4.9 mmol/L (ref 3.5–5.1)
Sodium: 135 mmol/L (ref 135–145)
Sodium: 134 mmol/L — ABNORMAL LOW (ref 135–145)
Sodium: 130 mmol/L — ABNORMAL LOW (ref 135–145)

## 2022-12-28 LAB — CBC
HCT: 31.7 % — ABNORMAL LOW (ref 39.0–52.0)
Hemoglobin: 10.8 g/dL — ABNORMAL LOW (ref 13.0–17.0)
MCH: 29.7 pg (ref 26.0–34.0)
MCHC: 34.1 g/dL (ref 30.0–36.0)
MCV: 87.1 fL (ref 80.0–100.0)
Platelets: 422 10*3/uL — ABNORMAL HIGH (ref 150–400)
RBC: 3.64 MIL/uL — ABNORMAL LOW (ref 4.22–5.81)
RDW: 12.1 % (ref 11.5–15.5)
WBC: 13.2 10*3/uL — ABNORMAL HIGH (ref 4.0–10.5)
nRBC: 0 % (ref 0.0–0.2)

## 2022-12-28 LAB — HEMOGLOBIN A1C
Hgb A1c MFr Bld: 10.4 % — ABNORMAL HIGH (ref 4.8–5.6)
Mean Plasma Glucose: 251.78 mg/dL

## 2022-12-28 LAB — CULTURE, BLOOD (ROUTINE X 2)

## 2022-12-28 LAB — MAGNESIUM: Magnesium: 2.5 mg/dL — ABNORMAL HIGH (ref 1.7–2.4)

## 2022-12-28 LAB — BETA-HYDROXYBUTYRIC ACID: Beta-Hydroxybutyric Acid: 0.59 mmol/L — ABNORMAL HIGH (ref 0.05–0.27)

## 2022-12-28 LAB — TROPONIN I (HIGH SENSITIVITY): Troponin I (High Sensitivity): 54 ng/L — ABNORMAL HIGH (ref <18)

## 2022-12-28 MED ORDER — INSULIN ASPART 100 UNIT/ML IJ SOLN
0.0000 [IU] | Freq: Every day | INTRAMUSCULAR | Status: DC
  Administered 2022-12-29: 4 [IU] via SUBCUTANEOUS

## 2022-12-28 MED ORDER — DOXYCYCLINE HYCLATE 100 MG PO TABS
100.0000 mg | ORAL_TABLET | Freq: Two times a day (BID) | ORAL | Status: DC
  Administered 2022-12-28 – 2022-12-30 (×5): 100 mg via ORAL
  Filled 2022-12-28 (×5): qty 1

## 2022-12-28 MED ORDER — INSULIN ASPART 100 UNIT/ML IJ SOLN
4.0000 [IU] | Freq: Three times a day (TID) | INTRAMUSCULAR | Status: DC
  Administered 2022-12-28 – 2022-12-29 (×4): 4 [IU] via SUBCUTANEOUS

## 2022-12-28 MED ORDER — INSULIN ASPART 100 UNIT/ML IJ SOLN
0.0000 [IU] | Freq: Three times a day (TID) | INTRAMUSCULAR | Status: DC
  Administered 2022-12-28: 7 [IU] via SUBCUTANEOUS
  Administered 2022-12-28 – 2022-12-29 (×2): 3 [IU] via SUBCUTANEOUS
  Administered 2022-12-29 – 2022-12-30 (×2): 5 [IU] via SUBCUTANEOUS

## 2022-12-28 MED ORDER — INSULIN GLARGINE-YFGN 100 UNIT/ML ~~LOC~~ SOLN
20.0000 [IU] | Freq: Every day | SUBCUTANEOUS | Status: DC
  Administered 2022-12-28 – 2022-12-29 (×2): 20 [IU] via SUBCUTANEOUS
  Filled 2022-12-28 (×2): qty 0.2

## 2022-12-28 NOTE — Progress Notes (Signed)
Bilateral lower extremity venous duplex has been completed. Preliminary results can be found in CV Proc through chart review.   12/28/22 9:36 AM Greg Ranyia Witting RVT   

## 2022-12-28 NOTE — Progress Notes (Addendum)
Inpatient Diabetes Program Recommendations  AACE/ADA: New Consensus Statement on Inpatient Glycemic Control (2015)  Target Ranges:  Prepandial:   less than 140 mg/dL      Peak postprandial:   less than 180 mg/dL (1-2 hours)      Critically ill patients:  140 - 180 mg/dL   Lab Results  Component Value Date   GLUCAP 143 (H) 12/28/2022   HGBA1C 10.0 (H) 03/21/2022    Review of Glycemic Control  Latest Reference Range & Units 12/28/22 04:35 12/28/22 05:14 12/28/22 06:16 12/28/22 07:48  Glucose-Capillary 70 - 99 mg/dL 147 (H) 829 (H) 562 (H) 143 (H)   Diabetes history: DM 1 Outpatient Diabetes medications:  Levemir 20 units q HS, Relion 70/30?? Current orders for Inpatient glycemic control:  IV insulin  Inpatient Diabetes Program Recommendations:   Looks like patient is possibly ready for transition. Consider Semglee 20 units 2 hours prior to d/c of IV insulin. Also consider Novolog 4 units tid with meals and Novolog sensitive tid with meals and HS. Will see patient today.  Addendum:  1330 spoke with patient at length.  He states that he had Levemir but was taking 70/30 with meals (as CHO coverage).  Explained that if he is going to buy short acting insulin at [facility], he needs Novolin R (which should be taken 30 minutes prior to meal).  Patient moved from [place] and does not have endocrinologist anymore.  Things have been very stressful for him lately and he was unable to afford the high co-pay on Humalog.  Per benefits check, Humalog copay is 15$ and Semglee co-pay is 15$ (SO WILL NEED THESE ORDERED AT DISCHARGE).  He also would prefer a sensor for monitoring but was unable to afford.  Per pharmacy, Dexcom is [phone] co-pay (after deductible).  Explained to patient.  We can give him one Dexcom at d/c as sample hopefully to get him through.  Will follow up with patient on 4/23.    Thanks,  Beryl Meager, RN, BC-ADM Inpatient Diabetes Coordinator Pager [phone]  (8a-5p)

## 2022-12-28 NOTE — Progress Notes (Signed)
Progress Note   Patient: David Ramsey ZOX:[id number] DOB: Oct 24, 1995 DOA: 12/26/2022     1 DOS: the patient was seen and examined on 12/28/2022   Brief hospital course: 27 y.o. male with medical history significant for type 1 diabetes mellitus, hypothyroidism, and diabetic polyneuropathy who presents to the emergency department with abdominal discomfort, nausea, vomiting, fatigue, and shortness of breath.   Patient states that his wife and daughter recently had an upper respiratory illness.  Patient himself developed cough, congestion, and shortness of breath as well.  While the respiratory symptoms have been improving, he has developed fatigue, nausea, nonbloody vomiting, and abdominal discomfort reports adherence with his insulin but notes that his regimen was recently changed to his sugars have been more elevated since then.   ED Course: Upon arrival to the ED, patient is found to be afebrile and saturating well on room air with elevated heart rate and stable blood pressure.  EKG demonstrates sinus rhythm.  Chest x-ray notable for slight elevation of the right hemidiaphragm and mild peribronchial thickening.  Right upper quadrant ultrasound is a normal study.  Labs are most notable for glucose 517, potassium 5.6, bicarbonate 13, creatinine 1.90, WBC 23,000, platelets 514,000, and lactic acid 3.5.   Blood culture was collected and patient was given 2.5 L of IV fluids, Reglan, Benadryl, Zofran, acetaminophen, DuoNeb, and was started on insulin infusion in the ED.  Assessment and Plan: 1. DKA  - Most recent A1c was 10.0% in July 2023  -Presenting anion gap in excess of 28 with glucose of 511 -Improved with insulin gtt and IVF - Anion gap closed and b-hydroxybutyric acid has improved -Transition to subq insulin   2. Sepsis secondary to metapneumovirus and likely post-viral pneumonia present on admit - Multiple SIRS criteria and elevated lactate noted -Nasal swab pos for metapneumovirus and  chest CT notable for pneumonia. Pt reports URI symptoms lasting over one week, thus suspecting post-viral pneumonia  - continue rocephin and doxy. Reportedly may not have tolerated azithro overnight   3. AKI  - SCr is 1.90 on admission, up from baseline closer to 1  - Likely prerenal in setting of N/V and osmotic diuresis  - Renal function is improving with IVF   4. Hyperkalemia  - Potassium is 5.6 in setting of AKI and insulin deficiency  - Continue IVF and insulin infusion -K normalized   5. Hypothyroidism  - Continue Synthroid    6. Hyponatremia -Cont IVF -Continue to follow bmet trends  7. Chest pain -Suspect musculoskeletal in the setting of presumed URI -continue with analgesia as needed -EKG changes noted with T wave abnormalities noted. Chart reviewed. Pt had prior similar EKG one year prior, suspecting electrolyte abnormalities and acidosis -stable -Chest CT neg for PE, however findings worrisome for PNA. Reviewed with pt  8. URI -positive sick contacts in family -covid and flu neg. -respiratory viral panel positive for metapneumovirus   Subjective: Still coughing with ample sputum production.  Feeling somewhat better today  Physical Exam: Vitals:   12/28/22 1302 12/28/22 1303 12/28/22 1304 12/28/22 1305  BP: 127/78     Pulse: (!) 108 (!) 108 (!) 109 (!) 104  Resp: (!) 22 (!) 23 19 (!) 23  Temp:      TempSrc:      SpO2: 95% 96% 96% 94%  Weight:      Height:       General exam: Conversant, in no acute distress Respiratory system: normal chest rise, clear, no audible wheezing  Cardiovascular system: regular rhythm, s1-s2 Gastrointestinal system: Nondistended, nontender, pos BS Central nervous system: No seizures, no tremors Extremities: No cyanosis, no joint deformities Skin: No rashes, no pallor Psychiatry: Affect normal // no auditory hallucinations   Data Reviewed:  Labs reviewed: Na 130, K 4.9, Cr 1.04  Family Communication: Pt in room, family not  at bedside  Disposition: Status is: Inpatient Remains inpatient appropriate because: Severity of illness  Planned Discharge Destination: Home    Author: Rickey Barbara, MD 12/28/2022 2:44 PM  For on call review [email or web address].

## 2022-12-29 ENCOUNTER — Inpatient Hospital Stay (HOSPITAL_COMMUNITY): Payer: BC Managed Care – PPO

## 2022-12-29 ENCOUNTER — Encounter: Payer: BC Managed Care – PPO | Admitting: Rehabilitative and Restorative Service Providers"

## 2022-12-29 DIAGNOSIS — E1010 Type 1 diabetes mellitus with ketoacidosis without coma: Secondary | ICD-10-CM | POA: Diagnosis not present

## 2022-12-29 LAB — GLUCOSE, CAPILLARY
Glucose-Capillary: 162 mg/dL — ABNORMAL HIGH (ref 70–99)
Glucose-Capillary: 302 mg/dL — ABNORMAL HIGH (ref 70–99)
Glucose-Capillary: 229 mg/dL — ABNORMAL HIGH (ref 70–99)
Glucose-Capillary: 272 mg/dL — ABNORMAL HIGH (ref 70–99)
Glucose-Capillary: 112 mg/dL — ABNORMAL HIGH (ref 70–99)
Glucose-Capillary: 310 mg/dL — ABNORMAL HIGH (ref 70–99)

## 2022-12-29 LAB — COMPREHENSIVE METABOLIC PANEL
ALT: 142 U/L — ABNORMAL HIGH (ref 0–44)
AST: 320 U/L — ABNORMAL HIGH (ref 15–41)
Albumin: 2.6 g/dL — ABNORMAL LOW (ref 3.5–5.0)
Alkaline Phosphatase: 119 U/L (ref 38–126)
Anion gap: 8 (ref 5–15)
BUN: 7 mg/dL (ref 6–20)
CO2: 22 mmol/L (ref 22–32)
Calcium: 8.1 mg/dL — ABNORMAL LOW (ref 8.9–10.3)
Chloride: 103 mmol/L (ref 98–111)
Creatinine, Ser: 1.01 mg/dL (ref 0.61–1.24)
GFR, Estimated: >60 mL/min (ref >60)
Glucose, Bld: 186 mg/dL — ABNORMAL HIGH (ref 70–99)
Potassium: 4.3 mmol/L (ref 3.5–5.1)
Sodium: 133 mmol/L — ABNORMAL LOW (ref 135–145)
Total Bilirubin: 0.5 mg/dL (ref 0.3–1.2)
Total Protein: 6.4 g/dL — ABNORMAL LOW (ref 6.5–8.1)

## 2022-12-29 LAB — HEPATIC FUNCTION PANEL
ALT: 154 U/L — ABNORMAL HIGH (ref 0–44)
AST: 215 U/L — ABNORMAL HIGH (ref 15–41)
Albumin: 2.9 g/dL — ABNORMAL LOW (ref 3.5–5.0)
Alkaline Phosphatase: 144 U/L — ABNORMAL HIGH (ref 38–126)
Bilirubin, Direct: 0.1 mg/dL (ref 0.0–0.2)
Indirect Bilirubin: 0.5 mg/dL (ref 0.3–0.9)
Total Bilirubin: 0.6 mg/dL (ref 0.3–1.2)
Total Protein: 7.4 g/dL (ref 6.5–8.1)

## 2022-12-29 LAB — CBC
HCT: 34 % — ABNORMAL LOW (ref 39.0–52.0)
Hemoglobin: 11.5 g/dL — ABNORMAL LOW (ref 13.0–17.0)
MCH: 29.7 pg (ref 26.0–34.0)
MCHC: 33.8 g/dL (ref 30.0–36.0)
MCV: 87.9 fL (ref 80.0–100.0)
Platelets: 467 10*3/uL — ABNORMAL HIGH (ref 150–400)
RBC: 3.87 MIL/uL — ABNORMAL LOW (ref 4.22–5.81)
RDW: 12.5 % (ref 11.5–15.5)
WBC: 10.8 10*3/uL — ABNORMAL HIGH (ref 4.0–10.5)
nRBC: 0 % (ref 0.0–0.2)

## 2022-12-29 LAB — CULTURE, BLOOD (ROUTINE X 2)
Culture: NO GROWTH
Special Requests: ADEQUATE

## 2022-12-29 MED ORDER — CIPROFLOXACIN-FLUOCINOLONE PF 0.3-0.025 % OT SOLN: 0.2500 mL | Freq: Two times a day (BID) | OTIC | Status: DC

## 2022-12-29 MED ORDER — INSULIN ASPART 100 UNIT/ML IJ SOLN
5.0000 [IU] | Freq: Three times a day (TID) | INTRAMUSCULAR | Status: DC
  Administered 2022-12-30: 5 [IU] via SUBCUTANEOUS

## 2022-12-29 MED ORDER — KETOROLAC TROMETHAMINE 30 MG/ML IJ SOLN
30.0000 mg | Freq: Four times a day (QID) | INTRAMUSCULAR | Status: DC | PRN
  Administered 2022-12-29 – 2022-12-30 (×3): 30 mg via INTRAVENOUS
  Filled 2022-12-29 (×3): qty 1

## 2022-12-29 MED ORDER — CIPROFLOXACIN-DEXAMETHASONE 0.3-0.1 % OT SUSP
4.0000 [drp] | Freq: Two times a day (BID) | OTIC | Status: DC
  Administered 2022-12-29 – 2022-12-30 (×2): 4 [drp] via OTIC
  Filled 2022-12-29: qty 7.5

## 2022-12-29 MED ORDER — INSULIN GLARGINE-YFGN 100 UNIT/ML ~~LOC~~ SOLN
24.0000 [IU] | Freq: Every day | SUBCUTANEOUS | Status: DC
  Administered 2022-12-30: 24 [IU] via SUBCUTANEOUS
  Filled 2022-12-29: qty 0.24

## 2022-12-29 MED ORDER — IOHEXOL 300 MG/ML  SOLN
75.0000 mL | Freq: Once | INTRAMUSCULAR | Status: AC | PRN
  Administered 2022-12-29: 75 mL via INTRAVENOUS

## 2022-12-29 NOTE — Progress Notes (Addendum)
Progress Note   Patient: David Ramsey:[id number] DOB: 1996-05-20 DOA: 12/26/2022     2 DOS: the patient was seen and examined on 12/29/2022   Brief hospital course: 27 y.o. male with medical history significant for type 1 diabetes mellitus, hypothyroidism, and diabetic polyneuropathy who presents to the emergency department with abdominal discomfort, nausea, vomiting, fatigue, and shortness of breath.   Patient states that his wife and daughter recently had an upper respiratory illness.  Patient himself developed cough, congestion, and shortness of breath as well.  While the respiratory symptoms have been improving, he has developed fatigue, nausea, nonbloody vomiting, and abdominal discomfort reports adherence with his insulin but notes that his regimen was recently changed to his sugars have been more elevated since then.   ED Course: Upon arrival to the ED, patient is found to be afebrile and saturating well on room air with elevated heart rate and stable blood pressure.  EKG demonstrates sinus rhythm.  Chest x-ray notable for slight elevation of the right hemidiaphragm and mild peribronchial thickening.  Right upper quadrant ultrasound is a normal study.  Labs are most notable for glucose 517, potassium 5.6, bicarbonate 13, creatinine 1.90, WBC 23,000, platelets 514,000, and lactic acid 3.5.   Blood culture was collected and patient was given 2.5 L of IV fluids, Reglan, Benadryl, Zofran, acetaminophen, DuoNeb, and was started on insulin infusion in the ED.  Assessment and Plan: 1. DKA  - Most recent A1c was 10.0% in July 2023  -Presenting anion gap in excess of 28 with glucose of 511 -Improved with insulin gtt and IVF - Anion gap closed and b-hydroxybutyric acid has improved -Transitioned to subq insulin   2. Sepsis secondary to metapneumovirus and likely post-viral pneumonia present on admit - Multiple SIRS criteria and elevated lactate noted -Nasal swab pos for metapneumovirus and  chest CT notable for pneumonia. Pt reports URI symptoms lasting over one week, thus suspecting post-viral pneumonia  - continue rocephin and doxy. Reportedly may not have tolerated azithro this visit   3. AKI  - SCr is 1.90 on admission, up from baseline closer to 1  - Likely prerenal in setting of N/V and osmotic diuresis  - Renal function normalized after IVF   4. Hyperkalemia  - Potassium is 5.6 in setting of AKI and insulin deficiency  - normalized with IVF   5. Hypothyroidism  - Continue Synthroid    6. Hyponatremia -Improved with IVF -Recheck bmet in AM  7. Chest pain -Suspect musculoskeletal in the setting of presumed URI and active coughing -continue with analgesia as needed -EKG changes noted with T wave abnormalities noted. Chart reviewed. Pt had prior similar EKG one year prior, suspecting electrolyte abnormalities and acidosis -stable -Chest CT neg for PE, however findings worrisome for PNA. See above.  8. URI with metapneumovirus -positive sick contacts in family -covid and flu neg. -respiratory viral panel positive for metapneumovirus -continue with supportive care  9. Ear/neck pain -During this visit, pt reports new pain in B ears with pain radiating down neck. -Have ordered soft tissue CT neck, pending -Cont above abx for now. Have added cipro ear drops  10. Elevated LFT -RUQ US done at time of presentation reviewed, unremarkable -LFT's newly elevated this AM. Pt tolerating diet without n/v or other issues -Repeat LFT's are slightly higher -Have ordered acute hepatitis panel -Recheck cmp in AM -Discussed with [facility] on call who recommends close f/u as outpatient to further work up LFT's   Subjective: complaining  of continued productive cough and ear ache with radiation down the neck  Physical Exam: Vitals:   12/29/22 1550 12/29/22 1600 12/29/22 1700 12/29/22 1804  BP:    (!) 135/99  Pulse:  93 88 92  Resp:  16 15   Temp: 98.5 F (36.9 C)   98.3  F (36.8 C)  TempSrc: Oral   Oral  SpO2:  97% 96% 97%  Weight:      Height:       General exam: Awake, laying in bed, in nad Respiratory system: Normal respiratory effort, no wheezing Cardiovascular system: regular rate, s1, s2 Gastrointestinal system: Soft, nondistended, positive BS Central nervous system: CN2-12 grossly intact, strength intact Extremities: Perfused, no clubbing Skin: Normal skin turgor, no notable skin lesions seen Psychiatry: Mood normal // no visual hallucinations   Data Reviewed:  Labs reviewed: Na 133, K 4.3, Cr 1.01, AST 320, ALT 142  Family Communication: Pt in room, family not at bedside  Disposition: Status is: Inpatient Remains inpatient appropriate because: Severity of illness  Planned Discharge Destination: Home    Author: Rickey Barbara, MD 12/29/2022 6:28 PM  For on call review [email or web address].

## 2022-12-29 NOTE — Progress Notes (Signed)
Pt stable at time of transfer. Report given.  

## 2022-12-29 NOTE — Inpatient Diabetes Management (Addendum)
Inpatient Diabetes Program Recommendations  AACE/ADA: New Consensus Statement on Inpatient Glycemic Control (2015)  Target Ranges:  Prepandial:   less than 140 mg/dL      Peak postprandial:   less than 180 mg/dL (1-2 hours)      Critically ill patients:  140 - 180 mg/dL   Lab Results  Component Value Date   GLUCAP 229 (H) 12/29/2022   HGBA1C 10.4 (H) 12/28/2022    Review of Glycemic Control  Latest Reference Range & Units 12/28/22 12:52 12/28/22 16:51 12/28/22 21:24 12/29/22 07:57  Glucose-Capillary 70 - 99 mg/dL 540 (H) 981 (H) 191 (H) 229 (H)  Diabetes history: DM 1 Outpatient Diabetes medications:  Levemir 20 units q HS, Relion 70/30?? Current orders for Inpatient glycemic control:  Novolog 0-9 units tid with meals and HS Semglee 20 units daily Novolog 4 units tid with meals Inpatient Diabetes Program Recommendations:    Consider increasing Novolog meal coverage to 5 units tid with meals and increase Semglee to 24 units daily.   At D/C please order: 1) Semglee pen [phone]) 2) Humalog kwikpen [phone]) 3) Insulin pen needles (#[id number]) 4) Dexcom G7- (#[id number])  Addendum 12 noon- MD ordered application of Dexcom G7-CGM at discharge for patient.  Education done regarding application and changing CGM sensor (alternate every 10 days on back of arms), 30 minute warm-up, use of glucometer when alert displays, how to place CGM G7 app on phone and information for PCP. Patient has also been given educational packet regarding use CGM sensor including the 1-800 toll free number for any questions, problems or needs related to the [facility] sensors or reader.    Sensor applied by patient to right arm at 12 PM.  Explained that glucose readings will not be available until 30 minutes after application. Reviewed use of CGM including how to obtain glucose readings, changing Sensor, arrows with glucose readings, and Dexcom App. Patient very appreciative of sensors. We discussed DM management and  need for him to get MD/Endocrinologist in [place]. He states that he was diagnosed when he was 27 years old.  He is waiting to start new job and hopes to get insulin pump in the future.   Thanks,  Beryl Meager, RN, BC-ADM Inpatient Diabetes Coordinator Pager [phone]  (8a-5p)

## 2022-12-30 DIAGNOSIS — E1010 Type 1 diabetes mellitus with ketoacidosis without coma: Secondary | ICD-10-CM | POA: Diagnosis not present

## 2022-12-30 LAB — COMPREHENSIVE METABOLIC PANEL
ALT: 103 U/L — ABNORMAL HIGH (ref 0–44)
AST: 73 U/L — ABNORMAL HIGH (ref 15–41)
Albumin: 2.7 g/dL — ABNORMAL LOW (ref 3.5–5.0)
Alkaline Phosphatase: 126 U/L (ref 38–126)
Anion gap: 10 (ref 5–15)
BUN: 10 mg/dL (ref 6–20)
CO2: 25 mmol/L (ref 22–32)
Calcium: 9.1 mg/dL (ref 8.9–10.3)
Chloride: 97 mmol/L — ABNORMAL LOW (ref 98–111)
Creatinine, Ser: 1.01 mg/dL (ref 0.61–1.24)
GFR, Estimated: >60 mL/min (ref >60)
Glucose, Bld: 268 mg/dL — ABNORMAL HIGH (ref 70–99)
Potassium: 4.8 mmol/L (ref 3.5–5.1)
Sodium: 132 mmol/L — ABNORMAL LOW (ref 135–145)
Total Bilirubin: 0.4 mg/dL (ref 0.3–1.2)
Total Protein: 7 g/dL (ref 6.5–8.1)

## 2022-12-30 LAB — CBC
HCT: 36.1 % — ABNORMAL LOW (ref 39.0–52.0)
Hemoglobin: 12.1 g/dL — ABNORMAL LOW (ref 13.0–17.0)
MCH: 29.5 pg (ref 26.0–34.0)
MCHC: 33.5 g/dL (ref 30.0–36.0)
MCV: 88 fL (ref 80.0–100.0)
Platelets: 539 10*3/uL — ABNORMAL HIGH (ref 150–400)
RBC: 4.1 MIL/uL — ABNORMAL LOW (ref 4.22–5.81)
RDW: 12.2 % (ref 11.5–15.5)
WBC: 10.1 10*3/uL (ref 4.0–10.5)
nRBC: 0 % (ref 0.0–0.2)

## 2022-12-30 LAB — HEPATITIS PANEL, ACUTE
HCV Ab: NONREACTIVE
Hep A IgM: NONREACTIVE
Hep B C IgM: NONREACTIVE
Hepatitis B Surface Ag: NONREACTIVE

## 2022-12-30 LAB — CULTURE, BLOOD (ROUTINE X 2)

## 2022-12-30 LAB — GLUCOSE, CAPILLARY: Glucose-Capillary: 275 mg/dL — ABNORMAL HIGH (ref 70–99)

## 2022-12-30 MED ORDER — INSULIN PEN NEEDLE 32G X 6 MM MISC: 10.0000 [IU] | Freq: Three times a day (TID) | 2 refills | Status: AC

## 2022-12-30 MED ORDER — CIPROFLOXACIN-DEXAMETHASONE 0.3-0.1 % OT SUSP: 4.0000 [drp] | Freq: Two times a day (BID) | OTIC | 0 refills | Status: DC

## 2022-12-30 MED ORDER — DEXCOM G7 SENSOR MISC: 1.0000 [IU] | 1 refills | Status: AC

## 2022-12-30 MED ORDER — DOXYCYCLINE HYCLATE 100 MG PO TABS: 100.0000 mg | ORAL_TABLET | Freq: Two times a day (BID) | ORAL | 0 refills | Status: AC

## 2022-12-30 MED ORDER — AMOXICILLIN-POT CLAVULANATE 875-125 MG PO TABS: 1.0000 | ORAL_TABLET | Freq: Two times a day (BID) | ORAL | 0 refills | Status: AC

## 2022-12-30 MED ORDER — FLUTICASONE PROPIONATE 50 MCG/ACT NA SUSP: 1.0000 | Freq: Every day | NASAL | 2 refills | Status: DC

## 2022-12-30 MED ORDER — INSULIN ASPART 100 UNIT/ML IJ SOLN: 7.0000 [IU] | Freq: Three times a day (TID) | INTRAMUSCULAR | Status: DC

## 2022-12-30 MED ORDER — HUMALOG KWIKPEN 200 UNIT/ML ~~LOC~~ SOPN
PEN_INJECTOR | SUBCUTANEOUS | 2 refills | Status: DC
Start: 2022-12-30 — End: 2023-01-28

## 2022-12-30 MED ORDER — GUAIFENESIN 100 MG/5ML PO LIQD: 5.0000 mL | ORAL | 0 refills | Status: DC | PRN

## 2022-12-30 MED ORDER — INSULIN GLARGINE-YFGN 100 UNIT/ML ~~LOC~~ SOLN: 26.0000 [IU] | Freq: Every day | SUBCUTANEOUS | Status: DC

## 2022-12-30 MED ORDER — INSULIN GLARGINE-YFGN 100 UNIT/ML ~~LOC~~ SOPN: 26.0000 [IU] | PEN_INJECTOR | Freq: Every day | SUBCUTANEOUS | 2 refills | Status: DC

## 2022-12-30 NOTE — Plan of Care (Signed)

## 2022-12-30 NOTE — Discharge Summary (Addendum)
Physician Discharge Summary   Patient: David Ramsey MRN: [id number] DOB: 10-16-1995  Admit date:     12/26/2022  Discharge date: 12/30/22  Discharge Physician: Arnetha Courser   PCP: [name], NP   Recommendations at discharge:  Please obtain CBC and BMP within a week Follow-up with primary care provider within a week Patient need her to get established with outpatient endocrinology for better control of his type 1 diabetes  Discharge Diagnoses: Principal Problem:   DKA (diabetic ketoacidosis) Active Problems:   Hyperkalemia   AKI (acute kidney injury)   Hypothyroidism (acquired)   SIRS (systemic inflammatory response syndrome)   Hospital Course: 27 y.o. male with medical history significant for type 1 diabetes mellitus, hypothyroidism, and diabetic polyneuropathy who presents to the emergency department with abdominal discomfort, nausea, vomiting, fatigue, and shortness of breath.   Patient states that his wife and daughter recently had an upper respiratory illness.  Patient himself developed cough, congestion, and shortness of breath as well.  While the respiratory symptoms have been improving, he has developed fatigue, nausea, nonbloody vomiting, and abdominal discomfort reports adherence with his insulin but notes that his regimen was recently changed to his sugars have been more elevated since then.   ED Course: Upon arrival to the ED, patient is found to be afebrile and saturating well on room air with elevated heart rate and stable blood pressure.  EKG demonstrates sinus rhythm.  Chest x-ray notable for slight elevation of the right hemidiaphragm and mild peribronchial thickening.  Right upper quadrant ultrasound is a normal study.  Labs are most notable for glucose 517, potassium 5.6, bicarbonate 13, creatinine 1.90, WBC 23,000, platelets 514,000, and lactic acid 3.5.   Blood culture was collected and patient was given 2.5 L of IV fluids, Reglan, Benadryl, Zofran,  acetaminophen, DuoNeb, and was started on insulin infusion in the ED.  Patient was found to be in DKA which was initially managed with insulin infusion and later transition to basal and short-acting once anion gap closes and beta-hydroxybutyrate acid normalized.  A1c was found to be elevated at 10.4.  Patient was apparently struggling to find an appointment with endocrinologist.  Hopefully his PCP should be able to help for better control of his diabetes.  Patient was also given prescription for Dexcom.  He was instructed to continue taking basal insulin at 15 units twice daily as prescribed before, he was apparently taking it just once daily.  He need to continue with short-acting insulin.  He was also found to be positive for metapneumovirus and CT chest notable for pneumonia.  His wife and daughter was also sick for similar symptoms.  He received Rocephin and doxycycline while in the hospital and discharged on Augmentin and doxycycline for 2 more days.  Patient did had AKI on admission secondary to dehydration which improved with IV fluid.  His hyperkalemia resolved and potassium normalized.  Patient did had pseudo hyponatremia secondary to hyperglycemia.  Corrected sodium was normal.  Patient was instructed to continue using his insulin regularly and need to have a close follow-up with PCP.  PCP should be able to find a close appointment with endocrinologist to get established for better management of his type 1 diabetes.   Consultants: None Procedures performed: None Disposition: Home Diet recommendation:  Discharge Diet Orders (From admission, onward)     Start     Ordered   12/30/22 0000  Diet - low sodium heart healthy        12/30/22 1018  Cardiac and Carb modified diet DISCHARGE MEDICATION: Allergies as of 12/30/2022       Reactions   Tape Rash, Other (See Comments)   Blisters- can use kerlix tape; IS SENSITIVE TO THE CLEAR/PLASTIC TAPE IN THE HOSPITAL!!    Tramadol Other (See Comments)   Made the patient pass out   Azithromycin Itching, Other (See Comments)   Itching and burning at IV site        Medication List     STOP taking these medications    acetaminophen-codeine 300-30 MG tablet Commonly known as: TYLENOL #3   HYDROcodone-acetaminophen 5-325 MG tablet Commonly known as: Norco   ibuprofen 200 MG tablet Commonly known as: ADVIL   Levemir FlexTouch 100 UNIT/ML FlexPen Generic drug: insulin detemir   pregabalin 75 MG capsule Commonly known as: LYRICA   RELION 70/30 [place]   traMADol 50 MG tablet Commonly known as: ULTRAM   TYLENOL 500 MG tablet Generic drug: acetaminophen       TAKE these medications    albuterol 108 (90 Base) MCG/ACT inhaler Commonly known as: VENTOLIN HFA Inhale 2 puffs into the lungs every 6 (six) hours as needed for wheezing or shortness of breath.   amoxicillin-clavulanate 875-125 MG tablet Commonly known as: AUGMENTIN Take 1 tablet by mouth 2 (two) times daily for 4 doses.   blood glucose meter kit and supplies Kit Dispense based on patient and insurance preference. Use up to four times daily as directed.   ciprofloxacin-dexamethasone OTIC suspension Commonly known as: CIPRODEX Place 4 drops into both ears 2 (two) times daily.   Clear Eyes Pure Relief MS PF 0.3-0.25-0.1 % Soln Generic drug: Hypromell-Glyc-Phenyleph PF Place 1 drop into both eyes 3 (three) times daily as needed (for irritation).   Dexcom G7 Sensor Misc 1 Units by Does not apply route once a week for 10 days.   doxycycline 100 MG tablet Commonly known as: VIBRA-TABS Take 1 tablet (100 mg total) by mouth 2 (two) times daily for 4 doses.   FLUoxetine 40 MG capsule Commonly known as: PROZAC Take 1 capsule (40 mg total) by mouth daily.   fluticasone 50 MCG/ACT nasal spray Commonly known as: FLONASE Place 1 spray into both nostrils daily.   guaiFENesin 100 MG/5ML liquid Commonly known as: ROBITUSSIN Take 5 mLs  by mouth every 4 (four) hours as needed for cough or to loosen phlegm.   HumaLOG KwikPen 200 UNIT/ML KwikPen Generic drug: insulin lispro Take 3 times a day with meals.  1 unit for CBG 121-150, 2 units for 151-200, 3 units for 201-250, 5 units for 251-300, 7 units for 301-350, 9 units for 351-400.   insulin glargine-yfgn 100 UNIT/ML Pen Commonly known as: SEMGLEE Inject 26 Units into the skin at bedtime.   Insulin Pen Needle 32G X 6 MM Misc 10 Units by Does not apply route 3 (three) times daily before meals.   ipratropium 0.06 % nasal spray Commonly known as: ATROVENT Place 2 sprays into both nostrils 4 (four) times daily as needed for rhinitis.   levothyroxine 175 MCG tablet Commonly known as: SYNTHROID Take 1 tablet (175 mcg total) by mouth daily. What changed: when to take this   naproxen 500 MG tablet Commonly known as: Naprosyn Take 1 tablet (500 mg total) by mouth 2 (two) times daily with a meal. What changed:  when to take this reasons to take this   ondansetron 4 MG disintegrating tablet Commonly known as: ZOFRAN-ODT Take 1 tablet (4 mg total) by mouth every  8 (eight) hours as needed for nausea or vomiting.   pantoprazole 40 MG tablet Commonly known as: PROTONIX Take 1 tablet (40 mg total) by mouth daily.        Follow-up Information     [name], NP. Schedule an appointment as soon as possible for a visit in 1 week(s).   Specialties: Family Medicine, Emergency Medicine Contact information: [address] [place] [address] [phone]                Discharge Exam: Ceasar Mons Weights   12/26/22 1911 12/27/22 0246  Weight: 74.8 kg 75.5 kg   General.  Well-developed gentleman, in no acute distress. Pulmonary.  Few right basal rhonchi, normal respiratory effort. CV.  Regular rate and rhythm, no JVD, rub or murmur. Abdomen.  Soft, nontender, nondistended, BS positive. CNS.  Alert and oriented .  No focal neurologic  deficit. Extremities.  No edema, no cyanosis, pulses intact and symmetrical. Psychiatry.  Judgment and insight appears normal.   Condition at discharge: stable  The results of significant diagnostics from this hospitalization (including imaging, microbiology, ancillary and laboratory) are listed below for reference.   Imaging Studies: CT SOFT TISSUE NECK W CONTRAST  Result Date: 12/29/2022 CLINICAL DATA:  Initial evaluation for epiglottitis or tonsillitis. EXAM: CT NECK WITH CONTRAST TECHNIQUE: Multidetector CT imaging of the neck was performed using the standard protocol following the bolus administration of intravenous contrast. RADIATION DOSE REDUCTION: This exam was performed according to the departmental dose-optimization program which includes automated exposure control, adjustment of the mA and/or kV according to patient size and/or use of iterative reconstruction technique. CONTRAST:  75mL OMNIPAQUE IOHEXOL 300 MG/ML  SOLN COMPARISON:  None Available. FINDINGS: Pharynx and larynx: Oral cavity within normal limits. No acute abnormality about the dentition. Palatine tonsils are fairly symmetric without CT findings of acute tonsillitis. No tonsillar or peritonsillar abscess. Remainder of the oropharynx and nasopharynx within normal limits. No retropharyngeal collection or swelling. Epiglottis within normal limits. Vallecula largely effaced by the lingual tonsils. Remainder of the hypopharynx and supraglottic larynx within normal limits. Negative glottis. Subglottic airway clear. Salivary glands: Salivary glands including the parotid and submandibular glands are within normal limits. Thyroid: Few tiny thyroid nodules noted, largest of which measures 5 mm on the right. These are of doubtful significance given size, with no follow-up imaging recommended (ref: J Am Coll Radiol. 2015 Feb;12(2): 143-50). Lymph nodes: No enlarged or pathologic adenopathy within the neck. Vascular: Normal vascular  enhancement seen throughout the neck. Limited intracranial: Unremarkable. Visualized orbits: Unremarkable. Mastoids and visualized paranasal sinuses: Moderate to advanced mucosal thickening in opacity about the sphenoid ethmoidal and maxillary sinuses. Visualized mastoid air cells and middle ear cavities are well pneumatized and free of fluid. Skeleton: No discrete or worrisome osseous lesions. Mild cervicothoracic scoliosis noted. Upper chest: Visualized upper chest demonstrates no visible acute finding. Other: None. IMPRESSION: 1. No CT evidence for acute tonsillitis or epiglottitis. No other acute inflammatory changes within the neck. 2. Moderate to advanced paranasal sinus disease as above. Electronically Signed   By: Rise Mu M.D.   On: 12/29/2022 22:11   VAS [place] LOWER EXTREMITY VENOUS (DVT)  Result Date: 12/28/2022  Lower Venous DVT Study Patient Name:  CAROLYN MANISCALCO  Date of Exam:   12/28/2022 Medical Rec #: [id number]        Accession #:    [id number] Date of Birth: 1996-08-15        Patient Gender: M  Patient Age:   51 years Exam Location:  [facility] Procedure:      VAS [place] LOWER EXTREMITY VENOUS (DVT) Referring Phys: STEPHEN CHIU --------------------------------------------------------------------------------  Indications: Pain.  Risk Factors: None identified. Comparison Study: No prior studies. Performing Technologist: Chanda Busing RVT  Examination Guidelines: A complete evaluation includes B-mode imaging, spectral Doppler, color Doppler, and power Doppler as needed of all accessible portions of each vessel. Bilateral testing is considered an integral part of a complete examination. Limited examinations for reoccurring indications may be performed as noted. The reflux portion of the exam is performed with the patient in reverse Trendelenburg.  +---------+---------------+---------+-----------+----------+--------------+ RIGHT     CompressibilityPhasicitySpontaneityPropertiesThrombus Aging +---------+---------------+---------+-----------+----------+--------------+ CFV      Full           Yes      Yes                                 +---------+---------------+---------+-----------+----------+--------------+ SFJ      Full                                                        +---------+---------------+---------+-----------+----------+--------------+ FV Prox  Full                                                        +---------+---------------+---------+-----------+----------+--------------+ FV Mid   Full                                                        +---------+---------------+---------+-----------+----------+--------------+ FV DistalFull                                                        +---------+---------------+---------+-----------+----------+--------------+ PFV      Full                                                        +---------+---------------+---------+-----------+----------+--------------+ POP      Full           Yes      Yes                                 +---------+---------------+---------+-----------+----------+--------------+ PTV      Full                                                        +---------+---------------+---------+-----------+----------+--------------+ PERO  Full                                                        +---------+---------------+---------+-----------+----------+--------------+   +---------+---------------+---------+-----------+----------+--------------+ LEFT     CompressibilityPhasicitySpontaneityPropertiesThrombus Aging +---------+---------------+---------+-----------+----------+--------------+ CFV      Full           Yes      Yes                                 +---------+---------------+---------+-----------+----------+--------------+ SFJ      Full                                                         +---------+---------------+---------+-----------+----------+--------------+ FV Prox  Full                                                        +---------+---------------+---------+-----------+----------+--------------+ FV Mid   Full                                                        +---------+---------------+---------+-----------+----------+--------------+ FV DistalFull                                                        +---------+---------------+---------+-----------+----------+--------------+ PFV      Full                                                        +---------+---------------+---------+-----------+----------+--------------+ POP      Full           Yes      Yes                                 +---------+---------------+---------+-----------+----------+--------------+ PTV      Full                                                        +---------+---------------+---------+-----------+----------+--------------+ PERO     Full                                                        +---------+---------------+---------+-----------+----------+--------------+  Summary: RIGHT: - There is no evidence of deep vein thrombosis in the lower extremity.  - No cystic structure found in the popliteal fossa.  LEFT: - There is no evidence of deep vein thrombosis in the lower extremity.  - No cystic structure found in the popliteal fossa.  *See table(s) above for measurements and observations. Electronically signed by Lemar Livings MD on 12/28/2022 at 3:01:44 PM.    Final    CT Angio Chest Pulmonary Embolism (PE) W or WO Contrast  Result Date: 12/27/2022 CLINICAL DATA:  Pulmonary embolism (PE) suspected, high prob DKA. Shortness of breath. EXAM: CT ANGIOGRAPHY CHEST WITH CONTRAST TECHNIQUE: Multidetector CT imaging of the chest was performed using the standard protocol during bolus administration of intravenous contrast. Multiplanar CT image  reconstructions and MIPs were obtained to evaluate the vascular anatomy. RADIATION DOSE REDUCTION: This exam was performed according to the departmental dose-optimization program which includes automated exposure control, adjustment of the mA and/or kV according to patient size and/or use of iterative reconstruction technique. CONTRAST:  75mL OMNIPAQUE IOHEXOL 350 MG/ML SOLN COMPARISON:  Chest radiograph yesterday. Chest CT 11/06/2021 FINDINGS: Cardiovascular: Motion artifact limitations. Allowing for this, there are no filling defects within the pulmonary arteries to suggest pulmonary embolus. The thoracic aorta is normal in caliber. No acute aortic findings. The heart is normal in size. No significant pericardial effusion. Mediastinum/Nodes: Mild subcarinal and right hilar adenopathy, subcarinal node measuring up to 17 mm, right hilar node measuring up to 11 mm. Additional shoddy mediastinal and left hilar lymph nodes. Decompressed esophagus. Lungs/Pleura: Breathing motion artifact limits detailed parenchymal assessment, particularly in the lower lung zones. Faint nodular and ground-glass airspace opacities within the right and left lower lobes, to a lesser extent lingula and right middle lobe. No endobronchial lesion or debris. There is no pleural fluid. Upper Abdomen: No acute findings. Pancreatic head calcifications typical of chronic pancreatitis. Musculoskeletal: There are no acute or suspicious osseous abnormalities. Mild bilateral gynecomastia, left greater than right. Review of the MIP images confirms the above findings. IMPRESSION: 1. No pulmonary embolus allowing for motion artifact limitations. 2. Faint nodular and ground-glass airspace opacities within both lower lobes, to a lesser extent lingula and right middle lobe. Findings are suspicious for multifocal pneumonia, including atypical viral organisms. 3. Mild mediastinal and right hilar adenopathy is likely reactive. Electronically Signed   By:  Narda Rutherford M.D.   On: 12/27/2022 18:33   US Abdomen Limited RUQ (LIVER/GB)  Result Date: 12/26/2022 CLINICAL DATA:  Right upper quadrant abdominal pain EXAM: ULTRASOUND ABDOMEN LIMITED RIGHT UPPER QUADRANT COMPARISON:  MRI 04/13/2022 FINDINGS: Gallbladder: No gallstones or wall thickening visualized. No sonographic Murphy sign noted by sonographer. Common bile duct: Diameter: 3 mm in proximal diameter Liver: No focal lesion identified. Within normal limits in parenchymal echogenicity. Portal vein is patent on color Doppler imaging with normal direction of blood flow towards the liver. Other: None. IMPRESSION: 1. Normal right upper quadrant sonogram. Electronically Signed   By: Helyn Numbers M.D.   On: 12/26/2022 23:50   DG Chest Portable 1 View  Result Date: 12/26/2022 CLINICAL DATA:  Cough and sepsis EXAM: PORTABLE CHEST 1 VIEW COMPARISON:  X-ray 04/11/2022 and older FINDINGS: Slight elevation of the right hemidiaphragm along its lateral aspect. No consolidation, pneumothorax or edema. Mild peribronchial thickening on the left. Normal cardiopericardial silhouette. Overlapping cardiac leads IMPRESSION: Slight elevation of the right hemidiaphragm. Mild peribronchial thickening on the left.  No consolidation Electronically Signed   By: Piedad Climes.D.  On: 12/26/2022 21:44   XR Hand Complete Right  Result Date: 12/18/2022 X-rays demonstrate consolidation of the fourth and fifth metacarpal neck fractures with appropriate alignment   Microbiology: Results for orders placed or performed during the hospital encounter of 12/26/22  Blood culture (routine x 2)     Status: None (Preliminary result)   Collection Time: 12/26/22  2:11 AM   Specimen: Thumb; Blood  Result Value Ref Range Status   Specimen Description   Final    THUMB BOTTLES DRAWN AEROBIC AND ANAEROBIC Performed at [facility], [address] [place], [place] [address]    Special Requests   Final    Blood  Culture adequate volume Performed at [facility], [address] [place], [place] [address]    Culture   Final    NO GROWTH 3 DAYS Performed at [facility], [address] [place], [place] [address]    Report Status PENDING  Incomplete  Resp panel by RT-PCR (RSV, Flu A&B, Covid) Anterior Nasal Swab     Status: None   Collection Time: 12/26/22  7:17 PM   Specimen: Anterior Nasal Swab  Result Value Ref Range Status   SARS Coronavirus 2 by RT PCR NEGATIVE NEGATIVE Final    Comment: (NOTE) SARS-CoV-2 target nucleic acids are NOT DETECTED.  The SARS-CoV-2 RNA is generally detectable in upper respiratory specimens during the acute phase of infection. The lowest concentration of SARS-CoV-2 viral copies this assay can detect is 138 copies/mL. A negative result does not preclude SARS-Cov-2 infection and should not be used as the sole basis for treatment or other patient management decisions. A negative result may occur with  improper specimen collection/handling, submission of specimen other than nasopharyngeal swab, presence of viral mutation(s) within the areas targeted by this assay, and inadequate number of viral copies(<138 copies/mL). A negative result must be combined with clinical observations, patient history, and epidemiological information. The expected result is Negative.  Fact Sheet for Patients:  BloggerCourse.com  Fact Sheet for Healthcare Providers:  SeriousBroker.it  This test is no t yet approved or cleared by the [place] FDA and  has been authorized for detection and/or diagnosis of SARS-CoV-2 by FDA under an Emergency Use Authorization (EUA). This EUA will remain  in effect (meaning this test can be used) for the duration of the COVID-19 declaration under Section 564(b)(1) of the Act, 21 U.S.C.section 360bbb-3(b)(1), unless the authorization is terminated  or revoked sooner.        Influenza A by PCR NEGATIVE NEGATIVE Final   Influenza B by PCR NEGATIVE NEGATIVE Final    Comment: (NOTE) The Xpert Xpress SARS-CoV-2/FLU/RSV plus assay is intended as an aid in the diagnosis of influenza from Nasopharyngeal swab specimens and should not be used as a sole basis for treatment. Nasal washings and aspirates are unacceptable for Xpert Xpress SARS-CoV-2/FLU/RSV testing.  Fact Sheet for Patients: BloggerCourse.com  Fact Sheet for Healthcare Providers: SeriousBroker.it  This test is not yet approved or cleared by the [place] FDA and has been authorized for detection and/or diagnosis of SARS-CoV-2 by FDA under an Emergency Use Authorization (EUA). This EUA will remain in effect (meaning this test can be used) for the duration of the COVID-19 declaration under Section 564(b)(1) of the Act, 21 U.S.C. section 360bbb-3(b)(1), unless the authorization is terminated or revoked.     Resp Syncytial Virus by PCR NEGATIVE NEGATIVE Final    Comment: (NOTE) Fact Sheet for Patients: BloggerCourse.com  Fact  Sheet for Healthcare Providers: SeriousBroker.it  This test is not yet approved or cleared by the [place] and has been authorized for detection and/or diagnosis of SARS-CoV-2 by FDA under an Emergency Use Authorization (EUA). This EUA will remain in effect (meaning this test can be used) for the duration of the COVID-19 declaration under Section 564(b)(1) of the Act, 21 U.S.C. section 360bbb-3(b)(1), unless the authorization is terminated or revoked.  Performed at [facility], [address] [place], [place] [address]   Blood culture (routine x 2)     Status: None (Preliminary result)   Collection Time: 12/27/22  2:57 AM   Specimen: BLOOD LEFT HAND  Result Value Ref Range Status   Specimen Description   Final    BLOOD LEFT HAND  BOTTLES DRAWN AEROBIC ONLY Performed at [facility], [address] [place], [place] [address]    Special Requests   Final    Blood Culture results may not be optimal due to an inadequate volume of blood received in culture bottles Performed at [facility], [address] [place], [place] [address]    Culture   Final    NO GROWTH 3 DAYS Performed at [facility], [address] [place], [place] [address]    Report Status PENDING  Incomplete  MRSA Next Gen by PCR, Nasal     Status: None   Collection Time: 12/27/22  3:36 AM   Specimen: Nasal Mucosa; Nasal Swab  Result Value Ref Range Status   MRSA by PCR Next Gen NOT DETECTED NOT DETECTED Final    Comment: (NOTE) The GeneXpert MRSA Assay (FDA approved for NASAL specimens only), is one component of a comprehensive MRSA colonization surveillance program. It is not intended to diagnose MRSA infection nor to guide or monitor treatment for MRSA infections. Test performance is not FDA approved in patients less than 38 years old. Performed at [facility], [address], [place], [place] [address]   Respiratory (~20 pathogens) panel by PCR     Status: Abnormal   Collection Time: 12/27/22 12:08 PM   Specimen: Nasopharyngeal Swab; Respiratory  Result Value Ref Range Status   Adenovirus NOT DETECTED NOT DETECTED Final   Coronavirus 229E NOT DETECTED NOT DETECTED Final    Comment: (NOTE) The Coronavirus on the Respiratory Panel, DOES NOT test for the novel  Coronavirus (2019 nCoV)    Coronavirus HKU1 NOT DETECTED NOT DETECTED Final   Coronavirus NL63 NOT DETECTED NOT DETECTED Final   Coronavirus OC43 NOT DETECTED NOT DETECTED Final   Metapneumovirus DETECTED (A) NOT DETECTED Final   Rhinovirus / Enterovirus NOT DETECTED NOT DETECTED Final   Influenza A NOT DETECTED NOT DETECTED Final   Influenza B NOT DETECTED NOT DETECTED Final   Parainfluenza Virus 1 NOT DETECTED NOT  DETECTED Final   Parainfluenza Virus 2 NOT DETECTED NOT DETECTED Final   Parainfluenza Virus 3 NOT DETECTED NOT DETECTED Final   Parainfluenza Virus 4 NOT DETECTED NOT DETECTED Final   Respiratory Syncytial Virus NOT DETECTED NOT DETECTED Final   Bordetella pertussis NOT DETECTED NOT DETECTED Final   Bordetella Parapertussis NOT DETECTED NOT DETECTED Final   Chlamydophila pneumoniae NOT DETECTED NOT DETECTED Final   Mycoplasma pneumoniae NOT DETECTED NOT DETECTED Final    Comment: Performed at [facility], [address], [place], [place] [address]    Labs: CBC: Recent Labs  Lab 12/26/22 1959 12/26/22 2006 12/27/22 0600 12/28/22 5784  12/29/22 0326 12/30/22 0435  WBC 23.0*  --  18.9* 13.2* 10.8* 10.1  NEUTROABS 19.2*  --   --   --   --   --   HGB 15.2 16.7 12.8* 10.8* 11.5* 12.1*  HCT 45.5 49.0 37.4* 31.7* 34.0* 36.1*  MCV 89.7  --  87.6 87.1 87.9 88.0  PLT 514*  --  460* 422* 467* 539*   Basic Metabolic Panel: Recent Labs  Lab 12/28/22 0306 12/28/22 0712 12/28/22 1120 12/29/22 0326 12/30/22 0435  NA 135 134* 130* 133* 132*  K 3.9 4.0 4.9 4.3 4.8  CL 105 103 102 103 97*  CO2 21* 19* 21* 22 25  GLUCOSE 169* 157* 186* 186* 268*  BUN 10 9 8 7 10   CREATININE 0.95 0.91 1.04 1.01 1.01  CALCIUM 8.1* 8.2* 8.2* 8.1* 9.1  MG 2.5*  --   --   --   --    Liver Function Tests: Recent Labs  Lab 12/26/22 1959 12/29/22 0326 12/29/22 1334 12/30/22 0435  AST 23 320* 215* 73*  ALT 19 142* 154* 103*  ALKPHOS 124 119 144* 126  BILITOT 1.7* 0.5 0.6 0.4  PROT 8.5* 6.4* 7.4 7.0  ALBUMIN 3.7 2.6* 2.9* 2.7*   CBG: Recent Labs  Lab 12/29/22 0757 12/29/22 1116 12/29/22 1651 12/29/22 2043 12/30/22 0718  GLUCAP 229* 272* 112* 310* 275*    Discharge time spent: greater than 30 minutes.  This record has been created using Conservation officer, historic buildings. Errors have been sought and corrected,but may not always be located. Such creation errors do not reflect on the  standard of care.   Signed: Arnetha Courser, MD [facility] 12/30/2022

## 2022-12-31 LAB — CULTURE, BLOOD (ROUTINE X 2)

## 2023-01-01 ENCOUNTER — Telehealth: Payer: Self-pay

## 2023-01-01 LAB — CULTURE, BLOOD (ROUTINE X 2): Culture: NO GROWTH

## 2023-01-01 NOTE — Transitions of Care (Post Inpatient/ED Visit) (Signed)
   01/01/2023  Name: David Ramsey MRN: [id number] DOB: 1996-05-14  Today's TOC FU Call Status: Today's TOC FU Call Status:: Unsuccessul Call (1st Attempt) Unsuccessful Call (1st Attempt) Date: 01/01/23  Attempted to reach the patient regarding the most recent Inpatient/ED visit.  Follow Up Plan: Additional outreach attempts will be made to reach the patient to complete the Transitions of Care (Post Inpatient/ED visit) call.   Jodelle Gross, RN, BSN, CCM Care Management Coordinator [place] Phone: [phone]/Fax: [phone]

## 2023-01-04 ENCOUNTER — Telehealth: Payer: Self-pay

## 2023-01-04 NOTE — Transitions of Care (Post Inpatient/ED Visit) (Signed)
   01/04/2023  Name: [name] MRN: [id number] DOB: March 10, 1996  Today's TOC FU Call Status: Today's TOC FU Call Status:: Unsuccessful Call (2nd Attempt) Unsuccessful Call (2nd Attempt) Date: 01/04/23  Attempted to reach the patient regarding the most recent Inpatient/ED visit.  Follow Up Plan: Additional outreach attempts will be made to reach the patient to complete the Transitions of Care (Post Inpatient/ED visit) call.   Jodelle Gross, RN, BSN, CCM Care Management Coordinator [place] Phone: [phone]/Fax: [phone]

## 2023-01-05 ENCOUNTER — Telehealth: Payer: Self-pay

## 2023-01-05 NOTE — Transitions of Care (Post Inpatient/ED Visit) (Signed)
   01/05/2023  Name: David Ramsey MRN: [id number] DOB: November 10, 1995  Today's TOC FU Call Status: Today's TOC FU Call Status:: Unsuccessful Call (3rd Attempt) Unsuccessful Call (3rd Attempt) Date: 01/05/23  Attempted to reach the patient regarding the most recent Inpatient/ED visit.  Follow Up Plan: No further outreach attempts will be made at this time. We have been unable to contact the patient.  Jodelle Gross, RN, BSN, CCM Care Management Coordinator [place] Phone: [phone]/Fax: [phone]

## 2023-01-21 ENCOUNTER — Telehealth: Payer: Self-pay | Admitting: Neurology

## 2023-01-21 ENCOUNTER — Ambulatory Visit (INDEPENDENT_AMBULATORY_CARE_PROVIDER_SITE_OTHER): Payer: BC Managed Care – PPO | Admitting: Family Medicine

## 2023-01-21 DIAGNOSIS — Z91199 Patient's noncompliance with other medical treatment and regimen due to unspecified reason: Secondary | ICD-10-CM

## 2023-01-21 NOTE — Telephone Encounter (Signed)
Patient had his third no show appointment with Ladona Ridgel today. He has received previous no show letters. Please send final letter. Thanks!

## 2023-01-21 NOTE — Progress Notes (Signed)
No show

## 2023-01-28 ENCOUNTER — Ambulatory Visit (HOSPITAL_BASED_OUTPATIENT_CLINIC_OR_DEPARTMENT_OTHER)
Admission: RE | Admit: 2023-01-28 | Discharge: 2023-01-28 | Disposition: A | Discharge status: Home / Self Care | Payer: BC Managed Care – PPO | Source: Ambulatory Visit | Attending: Family Medicine | Admitting: Family Medicine

## 2023-01-28 ENCOUNTER — Encounter: Payer: Self-pay | Admitting: Family Medicine

## 2023-01-28 ENCOUNTER — Ambulatory Visit (INDEPENDENT_AMBULATORY_CARE_PROVIDER_SITE_OTHER): Payer: BC Managed Care – PPO | Admitting: Family Medicine

## 2023-01-28 VITALS — BP 138/86 | HR 100 | Ht 66.0 in | Wt 168.0 lb

## 2023-01-28 DIAGNOSIS — S62369D Nondisplaced fracture of neck of unspecified metacarpal bone, subsequent encounter for fracture with routine healing: Secondary | ICD-10-CM

## 2023-01-28 DIAGNOSIS — E038 Other specified hypothyroidism: Secondary | ICD-10-CM | POA: Diagnosis not present

## 2023-01-28 DIAGNOSIS — E063 Autoimmune thyroiditis: Secondary | ICD-10-CM

## 2023-01-28 DIAGNOSIS — S62369A Nondisplaced fracture of neck of unspecified metacarpal bone, initial encounter for closed fracture: Secondary | ICD-10-CM

## 2023-01-28 DIAGNOSIS — E1065 Type 1 diabetes mellitus with hyperglycemia: Secondary | ICD-10-CM

## 2023-01-28 DIAGNOSIS — F419 Anxiety disorder, unspecified: Secondary | ICD-10-CM

## 2023-01-28 DIAGNOSIS — R112 Nausea with vomiting, unspecified: Secondary | ICD-10-CM

## 2023-01-28 DIAGNOSIS — Z0389 Encounter for observation for other suspected diseases and conditions ruled out: Secondary | ICD-10-CM | POA: Diagnosis not present

## 2023-01-28 DIAGNOSIS — R5383 Other fatigue: Secondary | ICD-10-CM

## 2023-01-28 DIAGNOSIS — F32A Depression, unspecified: Secondary | ICD-10-CM

## 2023-01-28 DIAGNOSIS — E1040 Type 1 diabetes mellitus with diabetic neuropathy, unspecified: Secondary | ICD-10-CM

## 2023-01-28 MED ORDER — LEVOTHYROXINE SODIUM 175 MCG PO TABS
175.0000 ug | ORAL_TABLET | Freq: Every day | ORAL | 1 refills | Status: DC
Start: 2023-01-28 — End: 2024-05-11

## 2023-01-28 MED ORDER — DEXCOM G7 SENSOR MISC
10 refills | Status: AC
Start: 2023-01-28 — End: ?

## 2023-01-28 MED ORDER — HUMALOG KWIKPEN 200 UNIT/ML ~~LOC~~ SOPN
PEN_INJECTOR | SUBCUTANEOUS | 2 refills | Status: DC
Start: 2023-01-28 — End: 2023-02-15

## 2023-01-28 MED ORDER — VENLAFAXINE HCL ER 37.5 MG PO CP24
37.5000 mg | ORAL_CAPSULE | Freq: Every day | ORAL | 1 refills | Status: DC
Start: 2023-01-28 — End: 2024-05-11

## 2023-01-28 MED ORDER — GABAPENTIN 300 MG PO CAPS
300.0000 mg | ORAL_CAPSULE | Freq: Three times a day (TID) | ORAL | 3 refills | Status: DC
Start: 2023-01-28 — End: 2023-05-12

## 2023-01-28 MED ORDER — ONDANSETRON 4 MG PO TBDP
4.0000 mg | ORAL_TABLET | Freq: Three times a day (TID) | ORAL | 0 refills | Status: DC | PRN
Start: 2023-01-28 — End: 2024-05-11

## 2023-01-28 NOTE — Assessment & Plan Note (Signed)
Starting Effexor and placing referral to Behavioral Health Taking the medicine as directed and not missing any doses is one of the best things you can do to treat your anxiety/depression.  Here are some things to keep in mind: Side effects (stomach upset, some increased anxiety) may happen before you notice a benefit.  These side effects typically go away over time. Changes to your dose of medicine or a change in medication all together is sometimes necessary Many people will notice an improvement within two weeks but the full effect of the medication can take up to 4-6 weeks Stopping the medication when you start feeling better often results in a return of symptoms. Most people need to be on medication at least 6-12 months If you start having thoughts of hurting yourself or others after starting this medicine, please call me immediately.

## 2023-01-28 NOTE — Patient Instructions (Addendum)
Starting Effexor and placing referral to Behavioral Health Taking the medicine as directed and not missing any doses is one of the best things you can do to treat your anxiety/depression.  Here are some things to keep in mind: Side effects (stomach upset, some increased anxiety) may happen before you notice a benefit.  These side effects typically go away over time. Changes to your dose of medicine or a change in medication all together is sometimes necessary Many people will notice an improvement within two weeks but the full effect of the medication can take up to 4-6 weeks Stopping the medication when you start feeling better often results in a return of symptoms. Most people need to be on medication at least 6-12 months If you start having thoughts of hurting yourself or others after starting this medicine, please call me immediately.     Continue closely monitoring your glucose levels. Recommend decreasing night time dose due to morning hypoglycemia and adding daytime. Start at 15 units BID and we can tweak from there. Trying to get you some Dexcom sensors. Urgent endocrinology referral placed.   Please schedule a lab appointment since they are closed today. We need to check your thyroid and other potential abnormalities related to fatigue.

## 2023-01-28 NOTE — Progress Notes (Signed)
Established Patient Office Visit  Subjective   Patient ID: David Ramsey, male    DOB: 1995-10-04  Age: 27 y.o. MRN: [id number]  Chief Complaint  Patient presents with   Medical Management of Chronic Issues    HPI  Patient is here for follow-up. He was last seen by me in December. He missed several recent appointments for myself and specialists due to transportation issues. States he recently lost his job and apartment. His wife and daughter are staying with a friend in [place] and he has moved in with a friend in [place]. He starts his new job next month and hopes they will all be living together soon.   He was recently hospitalized at [facility] for DKA, presenting with abdominal disocmfort, nausea, vomiting, fatigue, and dyspnea. He was also found to be positive for metapneumovirus and CT chest with pneumonia - he received rocephin and doxycycline while in the hospital and discharged on a few more days for Augmentin and doxycycline. AKI improved with IV fluids and hyperkalemia and hyponatremia normalized during admission. States he is feeling better respiratory-wise.  Diabetes (Type I) with polyneuropathy: - Checking glucose at home: Dexcom (got samples in the hospital); last night dropped to 45, currently at 455 - Medications: Semglee 15 units BID (states he has been taking 25 units at bedtime), sliding scale Humalog at meals (states he is currently taking Novolin OTC from [place]) - States he thinks the hospitalist don't know what they are doing so he prefers to do the insulin the way he has been doing it.   - Compliance: currently compliant, but taking differently than prescribed - Diet: carb conscious; states he hasn't been eating much (current financial concerns and mental problems) - Exercise: minimal - Eye exam: referral  - Foot exam: declined - Microalbumin: UTD - Denies symptoms of hypoglycemia, polyuria, polydipsia, numbness extremities, foot ulcers/trauma, wounds that are  not healing, medication side effects  - He is not currently on anything for neuropathy. He had done well on gabapentin, but then felt like he had developed a tolerance so he switched to Lyrica, but that did not help. He would like to try gabapentin again.  Lab Results  Component Value Date   HGBA1C 10.4 (H) 12/28/2022   Hypothyroidism: - Management: levothyroxine 175 mcg  -Taking medications as prescribed in the morning, apart from other foods, meds, vitamins, etc.  -No recent changes to hair, skin, nails, energy levels - His TSH was not checked at recent hospitalization.   Mood concerns: - States he feels disassociated. Some days he just doesn't want to get out of bed. No current SI/HI. - States he had been on Prozac in the past, but it triggered a lot of reflux. He has not been on medication in at least a year.   Right hand pain: He fractured his hand a few months ago after punching a wall. States he feels like his knuckles are not in proper alignment and he has pain in that area that somewhat improves with "popping" them. Pain is a lot worse at night. States he did not end up doing PT as recommended due to not feeling like it was necessary at the time. States ROM is normal.        01/28/2023    4:52 PM 03/16/2022   11:53 AM  PHQ9 SCORE ONLY  PHQ-9 Total Score 22 24      01/28/2023    4:53 PM 03/16/2022   11:53 AM  GAD 7 :  Generalized Anxiety Score  Nervous, Anxious, on Edge 3 2  Control/stop worrying 3 3  Worry too much - different things 3 3  Trouble relaxing 3 3  Restless 2 3  Easily annoyed or irritable 3 3  Afraid - awful might happen 2 2  Total GAD 7 Score 19 19  Anxiety Difficulty Extremely difficult Extremely difficult        ROS All review of systems negative except what is listed in the HPI    Objective:     BP 138/86   Pulse 100   Ht 5\' 6"  (1.676 m)   Wt 168 lb (76.2 kg)   SpO2 100%   BMI 27.12 kg/m    Physical Exam Vitals reviewed.   Constitutional:      Appearance: Normal appearance.  HENT:     Head: Normocephalic and atraumatic.  Cardiovascular:     Rate and Rhythm: Normal rate and regular rhythm.  Pulmonary:     Effort: Pulmonary effort is normal.     Breath sounds: Normal breath sounds.  Musculoskeletal:        General: No swelling or tenderness.     Right lower leg: No edema.     Left lower leg: No edema.  Skin:    General: Skin is warm and dry.  Neurological:     General: No focal deficit present.     Mental Status: He is alert and oriented to person, place, and time. Mental status is at baseline.  Psychiatric:        Mood and Affect: Mood normal.        Behavior: Behavior normal.        Thought Content: Thought content normal.        Judgment: Judgment normal.      No results found for any visits on 01/28/23.    The ASCVD Risk score (Arnett DK, et al., 2019) failed to calculate for the following reasons:   The 2019 ASCVD risk score is only valid for ages 41 to 77    Assessment & Plan:   Problem List Items Addressed This Visit     Hypothyroidism    Lab closed today. Please schedule a lab appointment for TSH. Endocrinology referral placed.       Relevant Medications   levothyroxine (SYNTHROID) 175 MCG tablet   Other Relevant Orders   Ambulatory referral to Endocrinology   TSH   AMB Referral to [facility] Coordinaton ([facility] Patients)   Uncontrolled type 1 diabetes mellitus with hyperglycemia, with long-term current use of insulin (HCC)    Continue closely monitoring your glucose levels. Recommend decreasing night time dose due to morning hypoglycemia and adding daytime. Start at 15 units BID and we can tweak from there. Trying to get you some Dexcom sensors. Urgent endocrinology referral placed.  [facility] care coordination referral placed.       Relevant Medications   Continuous Glucose Sensor (DEXCOM G7 SENSOR) MISC   insulin lispro (HUMALOG KWIKPEN) 200 UNIT/ML KwikPen   Other  Relevant Orders   Ambulatory referral to Endocrinology   Ambulatory referral to Ophthalmology   AMB Referral to [facility] Coordinaton ([facility] Patients)   Anxiety and depression    Starting Effexor and placing referral to Behavioral Health Taking the medicine as directed and not missing any doses is one of the best things you can do to treat your anxiety/depression.  Here are some things to keep in mind: Side effects (stomach upset, some increased anxiety) may happen before  you notice a benefit.  These side effects typically go away over time. Changes to your dose of medicine or a change in medication all together is sometimes necessary Many people will notice an improvement within two weeks but the full effect of the medication can take up to 4-6 weeks Stopping the medication when you start feeling better often results in a return of symptoms. Most people need to be on medication at least 6-12 months If you start having thoughts of hurting yourself or others after starting this medicine, please call me immediately.        Relevant Medications   venlafaxine XR (EFFEXOR XR) 37.5 MG 24 hr capsule   Other Relevant Orders   Ambulatory referral to Behavioral Health   Nausea & vomiting    Refill provided       Relevant Medications   ondansetron (ZOFRAN-ODT) 4 MG disintegrating tablet   Other Visit Diagnoses     Other fatigue    -  Primary Treating anxiety/depression and rechecking TSH. Adding other labs today as well.  Encouraged sleep hygiene, healthy diet, and regular exercise.    Relevant Orders   TSH   CBC with Differential/Platelet   IBC + Ferritin   Vitamin B12 CMP   Closed nondisplaced fracture of neck of metacarpal bone, unspecified metacarpal, initial encounter    Repeat xray today. Recommend following back up with ortho.     Relevant Orders   DG Hand Complete Right       Type 1 diabetes mellitus with diabetic neuropathy, unspecified (HCC)     Retrying gabapentin     Relevant Medications   insulin lispro (HUMALOG KWIKPEN) 200 UNIT/ML KwikPen   gabapentin (NEURONTIN) 300 MG capsule        Return in about 3 months (around 04/30/2023) for routine follow-up.    [name], NP

## 2023-01-28 NOTE — Assessment & Plan Note (Signed)
Refill provided

## 2023-01-28 NOTE — Progress Notes (Signed)
BH - wants referral in [place] (some days doesn't want to get out of bed) Narcolepsy symptoms Wants referral to Endocrinology  Neuropathy - wants to try Gabapentin again Hand pain - after breaking hand (healed)- knuckle area  Dexcom - wants to see if insurance will cover the 7's

## 2023-01-28 NOTE — Assessment & Plan Note (Signed)
Lab closed today. Please schedule a lab appointment for TSH. Endocrinology referral placed.

## 2023-01-28 NOTE — Assessment & Plan Note (Addendum)
Continue closely monitoring your glucose levels. Recommend decreasing night time dose due to morning hypoglycemia and adding daytime. Start at 15 units BID and we can tweak from there. Trying to get you some Dexcom sensors. Urgent endocrinology referral placed.  [facility] care coordination referral placed.

## 2023-01-29 ENCOUNTER — Telehealth: Payer: Self-pay

## 2023-01-29 NOTE — Telephone Encounter (Signed)
   Telephone encounter was:  Unsuccessful.  01/29/2023 Name: David Ramsey MRN: [id number] DOB: 12-May-1996  Unsuccessful outbound call made today to assist with: Financial Assistance  Outreach Attempt:  1st Attempt  A HIPAA compliant voice message was left requesting a return call.  Instructed patient to call back   Lenard Forth [facility] Guide, [facility] [phone] [address] [place], [place], [place] [address] Phone: [phone] Email: [email or web address]

## 2023-01-29 NOTE — Telephone Encounter (Signed)
PA initiated via Covermymeds; KEY: [id number].   PA approved.    [id number];Review Type:Prior Auth;Coverage Start Date:12/30/2022;Coverage End Date:01/29/2024;

## 2023-02-02 ENCOUNTER — Telehealth: Payer: Self-pay

## 2023-02-02 NOTE — Telephone Encounter (Signed)
   Telephone encounter was:  Unsuccessful.  02/02/2023 Name: David Ramsey MRN: [id number] DOB: [birth year]  Unsuccessful outbound call made today to assist with:  Food Insecurity and Financial Difficulties related to financial strain  Outreach Attempt:  2nd Attempt  A HIPAA compliant voice message was left requesting a return call.  Instructed patient to call back .   Lenard Forth [facility] Guide, [facility] [facility] [phone] [address] [place], [place], [place] [address] Phone: [phone] Email: [email or web address]

## 2023-02-03 ENCOUNTER — Telehealth: Payer: Self-pay

## 2023-02-03 NOTE — Progress Notes (Signed)
   Care Guide Note  02/03/2023 Name: David Ramsey MRN: [id number] DOB: 1995-12-24  Referred by: [name], NP Reason for referral : Care Coordination (Outreach to schedule with pharm d )   David Ramsey is a 27 y.o. year old male who is a primary care patient of [name], NP. David Ramsey was referred to the pharmacist for assistance related to HTN and DM.    An unsuccessful telephone outreach was attempted today to contact the patient who was referred to the pharmacy team for assistance with medication management. Additional attempts will be made to contact the patient.   [name], RMA Care Guide [facility]  [place], [place] [address] Direct Dial: [phone] [email or web address]

## 2023-02-04 ENCOUNTER — Telehealth: Payer: Self-pay

## 2023-02-04 NOTE — Telephone Encounter (Signed)
   Telephone encounter was:  Unsuccessful.  02/04/2023 Name: David Ramsey MRN: [id number] DOB: [birth year]  Unsuccessful outbound call made today to assist with:  Food Insecurity and Financial Difficulties related to    Outreach Attempt:  3rd Attempt.  Referral closed unable to contact patient.  A HIPAA compliant voice message was left requesting a return call.  Instructed patient to call back     Lenard Forth [facility] Guide, [facility] [phone] [address] [place], [place], [place] [address] Phone: [phone] Email: [email or web address]

## 2023-02-11 ENCOUNTER — Other Ambulatory Visit (INDEPENDENT_AMBULATORY_CARE_PROVIDER_SITE_OTHER): Payer: BC Managed Care – PPO

## 2023-02-11 DIAGNOSIS — E1065 Type 1 diabetes mellitus with hyperglycemia: Secondary | ICD-10-CM | POA: Diagnosis not present

## 2023-02-11 DIAGNOSIS — E063 Autoimmune thyroiditis: Secondary | ICD-10-CM | POA: Diagnosis not present

## 2023-02-11 DIAGNOSIS — E038 Other specified hypothyroidism: Secondary | ICD-10-CM | POA: Diagnosis not present

## 2023-02-11 DIAGNOSIS — R5383 Other fatigue: Secondary | ICD-10-CM

## 2023-02-11 DIAGNOSIS — R7989 Other specified abnormal findings of blood chemistry: Secondary | ICD-10-CM

## 2023-02-12 LAB — IBC + FERRITIN
Ferritin: 84.8 ng/mL (ref 22.0–322.0)
Iron: 153 ug/dL (ref 42–165)
Saturation Ratios: 49.9 % (ref 20.0–50.0)
TIBC: 306.6 ug/dL (ref 250.0–450.0)
Transferrin: 219 mg/dL (ref 212.0–360.0)

## 2023-02-12 LAB — COMPREHENSIVE METABOLIC PANEL
ALT: 162 U/L — ABNORMAL HIGH (ref 0–53)
AST: 209 U/L — ABNORMAL HIGH (ref 0–37)
Albumin: 4.2 g/dL (ref 3.5–5.2)
Alkaline Phosphatase: 94 U/L (ref 39–117)
BUN: 12 mg/dL (ref 6–23)
CO2: 31 mEq/L (ref 19–32)
Calcium: 9.9 mg/dL (ref 8.4–10.5)
Chloride: 99 mEq/L (ref 96–112)
Creatinine, Ser: 0.99 mg/dL (ref 0.40–1.50)
GFR: 105.75 mL/min (ref >60.00)
Glucose, Bld: 161 mg/dL — ABNORMAL HIGH (ref 70–99)
Potassium: 4.2 mEq/L (ref 3.5–5.1)
Sodium: 139 mEq/L (ref 135–145)
Total Bilirubin: 1.1 mg/dL (ref 0.2–1.2)
Total Protein: 7.1 g/dL (ref 6.0–8.3)

## 2023-02-12 LAB — TSH: TSH: 0.74 u[IU]/mL (ref 0.35–5.50)

## 2023-02-12 LAB — VITAMIN B12: Vitamin B-12: 496 pg/mL (ref 211–911)

## 2023-02-12 NOTE — Progress Notes (Signed)
Your liver enzymes are higher again. Since they have been persistently elevated and we have had unremarkable workup so far (negative for Hepatitis A, B, and C, unremarkable liver ultrasound, normal iron studies), lets refer to GI to further evaluate.  Other labs are stable at this time.

## 2023-02-12 NOTE — Addendum Note (Signed)
Addended by: Hyman Hopes B on: 02/12/2023 01:54 PM   Modules accepted: Orders

## 2023-02-15 ENCOUNTER — Other Ambulatory Visit: Payer: Self-pay

## 2023-02-15 DIAGNOSIS — E1065 Type 1 diabetes mellitus with hyperglycemia: Secondary | ICD-10-CM

## 2023-02-15 MED ORDER — HUMALOG KWIKPEN 200 UNIT/ML ~~LOC~~ SOPN
PEN_INJECTOR | SUBCUTANEOUS | 2 refills | Status: DC
Start: 2023-02-15 — End: 2024-05-11

## 2023-02-15 NOTE — Telephone Encounter (Signed)
Patient notified of providers message. 

## 2023-02-15 NOTE — Telephone Encounter (Signed)
Patient called requesting a pharmacy change for Humalog due to moving.  He reports he needs asap as his glucose level is in the the 400's.  Rx was sent to the pharmacy requested. Please let CMA know if he needs any other instructions.

## 2023-02-17 NOTE — Progress Notes (Signed)
   Care Guide Note  02/17/2023 Name: David Ramsey MRN: [id number] DOB: 12/08/1995  Referred by: [name], NP Reason for referral : Care Coordination (Outreach to schedule with pharm d )   David Ramsey is a 27 y.o. year old male who is a primary care patient of [name], NP. David Ramsey was referred to the pharmacist for assistance related to DM.    A second unsuccessful telephone outreach was attempted today to contact the patient who was referred to the pharmacy team for assistance with medication management. Additional attempts will be made to contact the patient.  [name], RMA Care Guide [facility]  [place], [place] [address] Direct Dial: [phone] [name]

## 2023-02-24 NOTE — Progress Notes (Signed)
   Care Guide Note  02/24/2023 Name: David Ramsey MRN: [id number] DOB: 10-16-1995  Referred by: [name], NP Reason for referral : Care Coordination (Outreach to schedule with pharm d )   David Ramsey is a 27 y.o. year old male who is a primary care patient of [name], NP. David Ramsey was referred to the pharmacist for assistance related to DM.    A third unsuccessful telephone outreach was attempted today to contact the patient who was referred to the pharmacy team for assistance with medication management. The Population Health team is pleased to engage with this patient at any time in the future upon receipt of referral and should he/she be interested in assistance from the [facility] team.   [name], RMA Care Guide [facility]  [place], [place] [address] Direct Dial: [phone] [name]

## 2023-02-25 DIAGNOSIS — F909 Attention-deficit hyperactivity disorder, unspecified type: Secondary | ICD-10-CM | POA: Diagnosis not present

## 2023-02-25 DIAGNOSIS — S80211A Abrasion, right knee, initial encounter: Secondary | ICD-10-CM | POA: Diagnosis not present

## 2023-02-25 DIAGNOSIS — Z794 Long term (current) use of insulin: Secondary | ICD-10-CM | POA: Diagnosis not present

## 2023-02-25 DIAGNOSIS — Z79890 Hormone replacement therapy (postmenopausal): Secondary | ICD-10-CM | POA: Diagnosis not present

## 2023-02-25 DIAGNOSIS — I82611 Acute embolism and thrombosis of superficial veins of right upper extremity: Secondary | ICD-10-CM | POA: Diagnosis not present

## 2023-02-25 DIAGNOSIS — N2889 Other specified disorders of kidney and ureter: Secondary | ICD-10-CM | POA: Diagnosis not present

## 2023-02-25 DIAGNOSIS — R160 Hepatomegaly, not elsewhere classified: Secondary | ICD-10-CM | POA: Diagnosis not present

## 2023-02-25 DIAGNOSIS — E1042 Type 1 diabetes mellitus with diabetic polyneuropathy: Secondary | ICD-10-CM | POA: Diagnosis not present

## 2023-02-25 DIAGNOSIS — F419 Anxiety disorder, unspecified: Secondary | ICD-10-CM | POA: Diagnosis not present

## 2023-02-25 DIAGNOSIS — R1013 Epigastric pain: Secondary | ICD-10-CM | POA: Diagnosis not present

## 2023-02-25 DIAGNOSIS — Z79899 Other long term (current) drug therapy: Secondary | ICD-10-CM | POA: Diagnosis not present

## 2023-02-25 DIAGNOSIS — E063 Autoimmune thyroiditis: Secondary | ICD-10-CM | POA: Diagnosis not present

## 2023-02-25 DIAGNOSIS — Z886 Allergy status to analgesic agent status: Secondary | ICD-10-CM | POA: Diagnosis not present

## 2023-02-25 DIAGNOSIS — S41102A Unspecified open wound of left upper arm, initial encounter: Secondary | ICD-10-CM | POA: Diagnosis not present

## 2023-02-25 DIAGNOSIS — Z91048 Other nonmedicinal substance allergy status: Secondary | ICD-10-CM | POA: Diagnosis not present

## 2023-02-25 DIAGNOSIS — R609 Edema, unspecified: Secondary | ICD-10-CM | POA: Diagnosis not present

## 2023-02-25 DIAGNOSIS — E1065 Type 1 diabetes mellitus with hyperglycemia: Secondary | ICD-10-CM | POA: Diagnosis not present

## 2023-02-25 DIAGNOSIS — F32A Depression, unspecified: Secondary | ICD-10-CM | POA: Diagnosis not present

## 2023-02-25 DIAGNOSIS — R918 Other nonspecific abnormal finding of lung field: Secondary | ICD-10-CM | POA: Diagnosis not present

## 2023-02-25 DIAGNOSIS — K6389 Other specified diseases of intestine: Secondary | ICD-10-CM | POA: Diagnosis not present

## 2023-02-25 DIAGNOSIS — K859 Acute pancreatitis without necrosis or infection, unspecified: Secondary | ICD-10-CM | POA: Diagnosis not present

## 2023-02-25 DIAGNOSIS — Z8669 Personal history of other diseases of the nervous system and sense organs: Secondary | ICD-10-CM | POA: Diagnosis not present

## 2023-02-25 DIAGNOSIS — J690 Pneumonitis due to inhalation of food and vomit: Secondary | ICD-10-CM | POA: Diagnosis not present

## 2023-02-25 DIAGNOSIS — E039 Hypothyroidism, unspecified: Secondary | ICD-10-CM | POA: Diagnosis not present

## 2023-03-01 ENCOUNTER — Ambulatory Visit (HOSPITAL_COMMUNITY): Payer: Self-pay | Admitting: Psychiatry

## 2023-03-03 ENCOUNTER — Encounter: Payer: Self-pay | Admitting: *Deleted

## 2023-03-03 ENCOUNTER — Telehealth: Payer: Self-pay | Admitting: *Deleted

## 2023-03-03 NOTE — Transitions of Care (Post Inpatient/ED Visit) (Signed)
   03/03/2023  Name: LAITH ANTONELLI MRN: [id number] DOB: 09/02/1996  Today's TOC FU Call Status: Today's TOC FU Call Status:: Unsuccessul Call (1st Attempt) Unsuccessful Call (1st Attempt) Date: 03/03/23  Attempted to reach the patient regarding the most recent Inpatient visit; Received automated outgoing message stating, "the person you are calling has a voice mail box that is full and cannot accept any messages at this time, goodbye;" unable to leave voice message requesting call back   Follow Up Plan: Additional outreach attempts will be made to reach the patient to complete the Transitions of Care (Post Inpatient visit) call.   Caryl Pina, RN, BSN, CCRN Alumnus RN CM Care Coordination/ Transition of Care- [facility] Care Management [phone]: direct office

## 2023-03-04 ENCOUNTER — Encounter: Payer: Self-pay | Admitting: *Deleted

## 2023-03-04 ENCOUNTER — Telehealth: Payer: Self-pay | Admitting: *Deleted

## 2023-03-04 NOTE — Transitions of Care (Post Inpatient/ED Visit) (Signed)
   03/04/2023  Name: David Ramsey MRN: [id number] DOB: Feb 22, 1996  Today's TOC FU Call Status: Today's TOC FU Call Status:: Unsuccessful Call (2nd Attempt) Unsuccessful Call (2nd Attempt) Date: 03/04/23  Attempted to reach the patient regarding the most recent Inpatient visit; Received automated outgoing voice message stating that patient's voice mail box is full; unable to leave voice message requesting call back   Follow Up Plan: Additional outreach attempts will be made to reach the patient to complete the Transitions of Care (Post Inpatient visit) call.   Caryl Pina, RN, BSN, CCRN Alumnus RN CM Care Coordination/ Transition of Care- [facility] Care Management [phone]: direct office

## 2023-03-05 ENCOUNTER — Telehealth: Payer: Self-pay | Admitting: *Deleted

## 2023-03-05 ENCOUNTER — Encounter: Payer: Self-pay | Admitting: *Deleted

## 2023-03-05 DIAGNOSIS — L02413 Cutaneous abscess of right upper limb: Secondary | ICD-10-CM | POA: Diagnosis not present

## 2023-03-05 NOTE — Transitions of Care (Post Inpatient/ED Visit) (Signed)
   03/05/2023  Name: David Ramsey MRN: [id number] DOB: 04/16/1996  Today's TOC FU Call Status: Today's TOC FU Call Status:: Unsuccessful Call (3rd Attempt) Unsuccessful Call (3rd Attempt) Date: 03/05/23  Attempted to reach the patient regarding the most recent Inpatient visit; Received automated outgoing message stating patient's voicemail box is full and unable to accept messages; unable to leave voice message requesting call back   Follow Up Plan: No further outreach attempts will be made at this time. We have been unable to contact the patient.  Caryl Pina, RN, BSN, CCRN Alumnus RN CM Care Coordination/ Transition of Care- [facility] Care Management [phone]: direct office

## 2023-03-24 ENCOUNTER — Other Ambulatory Visit: Payer: Self-pay

## 2023-03-24 DIAGNOSIS — E1065 Type 1 diabetes mellitus with hyperglycemia: Secondary | ICD-10-CM

## 2023-04-02 ENCOUNTER — Other Ambulatory Visit: Payer: BC Managed Care – PPO

## 2023-04-07 ENCOUNTER — Ambulatory Visit: Payer: BC Managed Care – PPO | Admitting: "Endocrinology

## 2023-04-07 ENCOUNTER — Encounter (INDEPENDENT_AMBULATORY_CARE_PROVIDER_SITE_OTHER): Payer: Self-pay

## 2023-04-29 ENCOUNTER — Ambulatory Visit: Payer: BC Managed Care – PPO | Admitting: Family Medicine

## 2023-04-30 DIAGNOSIS — H60501 Unspecified acute noninfective otitis externa, right ear: Secondary | ICD-10-CM | POA: Diagnosis not present

## 2023-04-30 DIAGNOSIS — H9201 Otalgia, right ear: Secondary | ICD-10-CM | POA: Diagnosis not present

## 2023-05-12 ENCOUNTER — Other Ambulatory Visit: Payer: Self-pay | Admitting: Family Medicine

## 2023-05-12 DIAGNOSIS — E1040 Type 1 diabetes mellitus with diabetic neuropathy, unspecified: Secondary | ICD-10-CM

## 2023-06-16 DIAGNOSIS — E860 Dehydration: Secondary | ICD-10-CM | POA: Diagnosis not present

## 2023-06-16 DIAGNOSIS — E1065 Type 1 diabetes mellitus with hyperglycemia: Secondary | ICD-10-CM | POA: Diagnosis not present

## 2023-06-16 DIAGNOSIS — Z7722 Contact with and (suspected) exposure to environmental tobacco smoke (acute) (chronic): Secondary | ICD-10-CM | POA: Diagnosis not present

## 2023-06-16 DIAGNOSIS — R112 Nausea with vomiting, unspecified: Secondary | ICD-10-CM | POA: Diagnosis not present

## 2023-06-16 DIAGNOSIS — E1165 Type 2 diabetes mellitus with hyperglycemia: Secondary | ICD-10-CM | POA: Diagnosis not present

## 2023-06-16 DIAGNOSIS — Z833 Family history of diabetes mellitus: Secondary | ICD-10-CM | POA: Diagnosis not present

## 2023-06-16 DIAGNOSIS — R1013 Epigastric pain: Secondary | ICD-10-CM | POA: Diagnosis not present

## 2023-06-16 DIAGNOSIS — R935 Abnormal findings on diagnostic imaging of other abdominal regions, including retroperitoneum: Secondary | ICD-10-CM | POA: Diagnosis not present

## 2023-06-16 DIAGNOSIS — R1084 Generalized abdominal pain: Secondary | ICD-10-CM | POA: Diagnosis not present

## 2023-06-16 DIAGNOSIS — Z794 Long term (current) use of insulin: Secondary | ICD-10-CM | POA: Diagnosis not present

## 2023-06-17 DIAGNOSIS — E1065 Type 1 diabetes mellitus with hyperglycemia: Secondary | ICD-10-CM | POA: Diagnosis not present

## 2024-02-21 ENCOUNTER — Other Ambulatory Visit: Payer: Self-pay | Admitting: Family Medicine

## 2024-02-21 DIAGNOSIS — E1040 Type 1 diabetes mellitus with diabetic neuropathy, unspecified: Secondary | ICD-10-CM

## 2024-05-11 ENCOUNTER — Ambulatory Visit: Payer: Self-pay | Admitting: Family Medicine

## 2024-05-11 ENCOUNTER — Encounter: Payer: Self-pay | Admitting: Family Medicine

## 2024-05-11 VITALS — BP 132/72 | HR 95 | Ht 66.0 in | Wt 188.0 lb

## 2024-05-11 DIAGNOSIS — R4189 Other symptoms and signs involving cognitive functions and awareness: Secondary | ICD-10-CM

## 2024-05-11 DIAGNOSIS — E039 Hypothyroidism, unspecified: Secondary | ICD-10-CM

## 2024-05-11 DIAGNOSIS — R0789 Other chest pain: Secondary | ICD-10-CM

## 2024-05-11 DIAGNOSIS — R9431 Abnormal electrocardiogram [ECG] [EKG]: Secondary | ICD-10-CM

## 2024-05-11 DIAGNOSIS — F419 Anxiety disorder, unspecified: Secondary | ICD-10-CM

## 2024-05-11 DIAGNOSIS — F32A Depression, unspecified: Secondary | ICD-10-CM

## 2024-05-11 DIAGNOSIS — R112 Nausea with vomiting, unspecified: Secondary | ICD-10-CM

## 2024-05-11 DIAGNOSIS — E063 Autoimmune thyroiditis: Secondary | ICD-10-CM

## 2024-05-11 DIAGNOSIS — E1065 Type 1 diabetes mellitus with hyperglycemia: Secondary | ICD-10-CM

## 2024-05-11 DIAGNOSIS — R5383 Other fatigue: Secondary | ICD-10-CM

## 2024-05-11 DIAGNOSIS — R1084 Generalized abdominal pain: Secondary | ICD-10-CM

## 2024-05-11 DIAGNOSIS — R140 Abdominal distension (gaseous): Secondary | ICD-10-CM

## 2024-05-11 DIAGNOSIS — R7989 Other specified abnormal findings of blood chemistry: Secondary | ICD-10-CM

## 2024-05-11 LAB — TROPONIN I (HIGH SENSITIVITY): High Sens Troponin I: 3 ng/L (ref 2–17)

## 2024-05-11 LAB — IBC + FERRITIN
Ferritin: 96.6 ng/mL (ref 22.0–322.0)
Iron: 47 ug/dL (ref 42–165)
Saturation Ratios: 14.3 % — ABNORMAL LOW (ref 20.0–50.0)
TIBC: 327.6 ug/dL (ref 250.0–450.0)
Transferrin: 234 mg/dL (ref 212.0–360.0)

## 2024-05-11 LAB — CBC WITH DIFFERENTIAL/PLATELET
Basophils Absolute: 0.1 K/uL (ref 0.0–0.1)
Basophils Relative: 0.7 % (ref 0.0–3.0)
Eosinophils Absolute: 0.3 K/uL (ref 0.0–0.7)
Eosinophils Relative: 3 % (ref 0.0–5.0)
HCT: 41.9 % (ref 39.0–52.0)
Hemoglobin: 14.1 g/dL (ref 13.0–17.0)
Lymphocytes Relative: 19.8 % (ref 12.0–46.0)
Lymphs Abs: 1.6 K/uL (ref 0.7–4.0)
MCHC: 33.6 g/dL (ref 30.0–36.0)
MCV: 88.7 fl (ref 78.0–100.0)
Monocytes Absolute: 0.7 K/uL (ref 0.1–1.0)
Monocytes Relative: 8 % (ref 3.0–12.0)
Neutro Abs: 5.7 K/uL (ref 1.4–7.7)
Neutrophils Relative %: 68.5 % (ref 43.0–77.0)
Platelets: 394 K/uL (ref 150.0–400.0)
RBC: 4.73 Mil/uL (ref 4.22–5.81)
RDW: 12.7 % (ref 11.5–15.5)
WBC: 8.3 K/uL (ref 4.0–10.5)

## 2024-05-11 LAB — COMPREHENSIVE METABOLIC PANEL WITH GFR
ALT: 24 U/L (ref 0–53)
AST: 23 U/L (ref 0–37)
Albumin: 4.3 g/dL (ref 3.5–5.2)
Alkaline Phosphatase: 79 U/L (ref 39–117)
BUN: 19 mg/dL (ref 6–23)
CO2: 31 meq/L (ref 19–32)
Calcium: 9.1 mg/dL (ref 8.4–10.5)
Chloride: 96 meq/L (ref 96–112)
Creatinine, Ser: 1.24 mg/dL (ref 0.40–1.50)
GFR: 80.01 mL/min (ref >60.00)
Glucose, Bld: 382 mg/dL — ABNORMAL HIGH (ref 70–99)
Potassium: 4.8 meq/L (ref 3.5–5.1)
Sodium: 137 meq/L (ref 135–145)
Total Bilirubin: 0.6 mg/dL (ref 0.2–1.2)
Total Protein: 7.2 g/dL (ref 6.0–8.3)

## 2024-05-11 LAB — EKG 12-LEAD

## 2024-05-11 LAB — LDL CHOLESTEROL, DIRECT: Direct LDL: 169 mg/dL

## 2024-05-11 LAB — HEMOGLOBIN A1C: Hgb A1c MFr Bld: 12.7 % — ABNORMAL HIGH (ref 4.6–6.5)

## 2024-05-11 LAB — LIPID PANEL
Cholesterol: 274 mg/dL — ABNORMAL HIGH (ref 0–200)
HDL: 46.1 mg/dL (ref >39.00)
NonHDL: 227.41
Total CHOL/HDL Ratio: 6
Triglycerides: 404 mg/dL — ABNORMAL HIGH (ref 0.0–149.0)
VLDL: 80.8 mg/dL — ABNORMAL HIGH (ref 0.0–40.0)

## 2024-05-11 LAB — TSH: TSH: 289.37 u[IU]/mL — ABNORMAL HIGH (ref 0.35–5.50)

## 2024-05-11 LAB — B12 AND FOLATE PANEL
Folate: 15.8 ng/mL (ref >5.9)
Vitamin B-12: 581 pg/mL (ref 211–911)

## 2024-05-11 MED ORDER — VENLAFAXINE HCL ER 37.5 MG PO CP24: 37.5000 mg | ORAL_CAPSULE | Freq: Every day | ORAL | 1 refills | Status: AC

## 2024-05-11 MED ORDER — ALBUTEROL SULFATE HFA 108 (90 BASE) MCG/ACT IN AERS: 2.0000 | INHALATION_SPRAY | Freq: Four times a day (QID) | RESPIRATORY_TRACT | 0 refills | Status: AC | PRN

## 2024-05-11 MED ORDER — ONDANSETRON 4 MG PO TBDP: 4.0000 mg | ORAL_TABLET | Freq: Three times a day (TID) | ORAL | 0 refills | Status: DC | PRN

## 2024-05-11 MED ORDER — INSULIN ASPART 100 UNIT/ML IJ SOLN: 8.0000 [IU] | Freq: Three times a day (TID) | INTRAMUSCULAR | 5 refills | Status: AC

## 2024-05-11 MED ORDER — LEVOTHYROXINE SODIUM 175 MCG PO TABS: 175.0000 ug | ORAL_TABLET | Freq: Every day | ORAL | 1 refills | Status: AC

## 2024-05-11 NOTE — Assessment & Plan Note (Signed)
 Blood glucose poorly controlled, readings high 100s to low 300s. No recent endocrinology follow-up. - Refill insulin  - Send new referral to endocrinology.

## 2024-05-11 NOTE — Assessment & Plan Note (Signed)
 Not on Synthroid , previous dose 175 mcg. Symptoms may relate to thyroid  dysfunction. - Refill Synthroid  at 175 mcg. - Order thyroid  function tests.

## 2024-05-11 NOTE — Assessment & Plan Note (Signed)
 High anxiety levels possibly exacerbated by health issues and stressors. NO SI/HI - Refill Effexor 

## 2024-05-11 NOTE — Assessment & Plan Note (Signed)
 Persistent symptoms not relieved by antacids or anti-nausea meds. Recent Pepto Bismol use precludes immediate H. pylori testing. - Send referral to gastroenterology. - Prescribe Zofran  for nausea.

## 2024-05-11 NOTE — Assessment & Plan Note (Signed)
See "abdominal pain"

## 2024-05-11 NOTE — Progress Notes (Signed)
 Established Patient Office Visit  Subjective   Patient ID: David Ramsey, male    DOB: 1995-10-24  Age: 28 y.o. MRN: [id number]  Chief Complaint  Patient presents with   Medical Management of Chronic Issues    HPI   Discussed the use of AI scribe software for clinical note transcription with the patient, who gave verbal consent to proceed.  History of Present Illness David Ramsey is a 28 year old male with diabetes who presents with elevated blood sugars and gastrointestinal symptoms.  He has been experiencing elevated blood sugars, ranging from the high 100s to mid-200s, occasionally reaching the low 300s, which makes him feel unwell. He is currently on 15 units of long-acting insulin  (Semglee  or Lantus ) once daily, previously taking 25-28 units.   He experiences significant gastrointestinal symptoms, including constant nausea, occasional vomiting, and severe bloating described as a painful gas bubble. The bloating feels like being in a 'bubble', and symptoms are not relieved by burping, carbonated beverages, or Pepto. He missed a gastroenterology appointment due to logistical issues.  He has developed persistent chest and back pain, described as a stabbing sensation radiating through the middle of his chest to his back, constant for two weeks and progressively worsening. This causes fatigue and muscle weakness. No upper abdominal pain unless nauseous, at which point he experiences a tight sensation. Pain is not related to exercise, as he has limited physical activity, working from home and only walking to pick up his daughter from the bus stop.  He has a history of thyroid  issues but has not been taking his levothyroxine .   Socially, he was homeless, living in his car, but now resides in a small apartment with his wife and daughter. He works from home on his laptop and has limited physical activity. He reports high stress levels due to his living situation and the clutter in his  apartment, which he finds overwhelming.     Diabetes (Type I) with polyneuropathy: - Checking glucose at home: 150-250s usually - Medications: Semglee  15 units daily, sliding scale Humalog  at meals (states he is currently taking Novolin OTC from [place]) - he has not seen his endocrinologist in awhile - Compliance: currently compliant, but taking differently than prescribed - Diet: carb conscious; states he hasn't been eating much (current financial concerns and mental problems) - Exercise: minimal - Eye exam: referral  - Foot exam: declined - Microalbumin:  - Denies symptoms of hypoglycemia, polyuria, polydipsia, numbness extremities, foot ulcers/trauma, wounds that are not healing, medication side effects  - He is not currently on anything for neuropathy. He had done well on gabapentin , but then felt like he had developed a tolerance so he switched to Lyrica , but that did not help. He would like to try gabapentin  again.  Lab Results  Component Value Date   HGBA1C 12.7 (H) 05/11/2024   Hypothyroidism: - Management: levothyroxine  175 mcg  - NOT taking meds   Mood follow-up: - Diagnosis: anxiety and depression - Treatment: Effexor  37.5 mg daily - Medication side effects: n/a, not taking - SI/HI: no - Update: not taking meds, needs new prescription       05/11/2024    1:10 PM 01/28/2023    4:52 PM 03/16/2022   11:53 AM  PHQ9 SCORE ONLY  PHQ-9 Total Score 16 22  24       Data saved with a previous flowsheet row definition      05/11/2024    1:10 PM 01/28/2023    4:53  PM 03/16/2022   11:53 AM  GAD 7 : Generalized Anxiety Score  Nervous, Anxious, on Edge 3 3 2   Control/stop worrying 2 3 3   Worry too much - different things 3 3 3   Trouble relaxing 3 3 3   Restless 2 2 3   Easily annoyed or irritable 3 3 3   Afraid - awful might happen 3 2 2   Total GAD 7 Score 19 19 19   Anxiety Difficulty Extremely difficult Extremely difficult Extremely difficult        ROS All review  of systems negative except what is listed in the HPI    Objective:     BP 132/72   Pulse 95   Ht 5' 6 (1.676 m)   Wt 188 lb (85.3 kg)   SpO2 100%   BMI 30.34 kg/m    Physical Exam Vitals reviewed.  Constitutional:      Appearance: Normal appearance.  HENT:     Head: Normocephalic and atraumatic.  Cardiovascular:     Rate and Rhythm: Normal rate and regular rhythm.  Pulmonary:     Effort: Pulmonary effort is normal.     Breath sounds: Normal breath sounds.  Musculoskeletal:        General: No swelling or tenderness.     Right lower leg: No edema.     Left lower leg: No edema.  Skin:    General: Skin is warm and dry.  Neurological:     General: No focal deficit present.     Mental Status: He is alert and oriented to person, place, and time. Mental status is at baseline.  Psychiatric:        Mood and Affect: Mood normal.        Behavior: Behavior normal.        Thought Content: Thought content normal.        Judgment: Judgment normal.     EKG: SR 87 bpm, non-specific T wave changes    The ASCVD Risk score (Arnett DK, et al., 2019) failed to calculate for the following reasons:   The 2019 ASCVD risk score is only valid for ages 40 to 93    Assessment & Plan:    Problem List Items Addressed This Visit       Active Problems   Hypothyroidism   Not on Synthroid , previous dose 175 mcg. Symptoms may relate to thyroid  dysfunction. - Refill Synthroid  at 175 mcg. - Order thyroid  function tests.      Relevant Medications   levothyroxine  (SYNTHROID ) 175 MCG tablet   Uncontrolled type 1 diabetes mellitus with hyperglycemia, with long-term current use of insulin  (HCC)   Blood glucose poorly controlled, readings high 100s to low 300s. No recent endocrinology follow-up. - Refill insulin  - Send new referral to endocrinology.      Relevant Medications   insulin  aspart (NOVOLOG  RELION) 100 UNIT/ML injection   Other Relevant Orders   Comprehensive metabolic panel  with GFR (Completed)   Lipid panel (Completed)   Ambulatory referral to Endocrinology   Hemoglobin A1c (Completed)   Elevated LFTs   Repeat labs      Relevant Orders   Comprehensive metabolic panel with GFR (Completed)   Abdominal pain   Persistent symptoms not relieved by antacids or anti-nausea meds. Recent Pepto Bismol use precludes immediate H. pylori testing. - Send referral to gastroenterology. - Prescribe Zofran  for nausea.      Relevant Orders   Ambulatory referral to Gastroenterology   Anxiety and depression   High anxiety  levels possibly exacerbated by health issues and stressors. NO SI/HI - Refill Effexor       Relevant Medications   venlafaxine  XR (EFFEXOR  XR) 37.5 MG 24 hr capsule   Nausea & vomiting - Primary   See abdominal pain      Relevant Medications   ondansetron  (ZOFRAN -ODT) 4 MG disintegrating tablet   Other Relevant Orders   Ambulatory referral to Gastroenterology   Other Visit Diagnoses       Chest discomfort     Constant stabbing chest pain, radiating to back, not exertion-related. Differential includes musculoskeletal, cardiac, or GI causes. - EKG = SR 87 bpm, non-specific T wave changes - History of abnormal EKGs. Order troponin today. Refer to cardiology. - Send referral to gastroenterology for further evaluation.   Relevant Orders   Troponin I (High Sensitivity) (Completed)   EKG 12-Lead (Completed)   Ambulatory referral to Cardiology     Fatigue, unspecified type     Labs today   Relevant Orders   B12 and Folate Panel (Completed)   CBC with Differential/Platelet (Completed)   Comprehensive metabolic panel with GFR (Completed)   TSH (Completed)   IBC + Ferritin (Completed)     Hypothyroidism due to Hashimoto's thyroiditis     See above   Relevant Medications   levothyroxine  (SYNTHROID ) 175 MCG tablet   Other Relevant Orders   Ambulatory referral to Endocrinology          Bloating     See above   Relevant Orders   Ambulatory  referral to Gastroenterology     Abnormal EKG     See above   Relevant Orders   Ambulatory referral to Cardiology         Return for - pending results or sooner if needed; routine f/u 3 months .    Waddell KATHEE Mon, NP

## 2024-05-11 NOTE — Assessment & Plan Note (Signed)
 Repeat labs

## 2024-05-12 ENCOUNTER — Ambulatory Visit: Payer: Self-pay | Admitting: Family Medicine

## 2024-05-12 DIAGNOSIS — E039 Hypothyroidism, unspecified: Secondary | ICD-10-CM

## 2024-05-12 NOTE — Telephone Encounter (Signed)
 See note- okay with meds.

## 2024-07-03 NOTE — Discharge Summary (Signed)
 Hospitalist  Discharge Summary   Name: David Ramsey Age: 28 yrs  MRN: [id number] DOB: 23-Jun-1996  Admit Date: 06/19/2024 Admitting Physician: Dorn Ozell Murray, MD  Discharge Date: 07/03/2024 Discharge Physician: Jordan Matthew Poles, MD   Admission Diagnosis:   Hypoglycemia [E16.2] Hyperglycemia [R73.9] Type 1 diabetes mellitus with hyperglycemia    (CMD) [E10.65]   Discharge Diagnoses:   Principal Problem (Resolved):   Hyperglycemia Active Problems:   Hypothyroidism   T1DM (type 1 diabetes mellitus)   CKD (chronic kidney disease)   Abdominal pain   Diabetic neuropathy    (CMD)   Low back phlegmon   Urinary retention   Constipation Resolved Problems:   Hypoglycemia   Hypomagnesemia   Abnormal liver enzymes   Colitis   Acute intractable headache  Hospital Course:   For full details, please see H&P, progress notes, consult notes and ancillary notes.   David Ramsey. David Ramsey is a 28 year old male with a history of type 1 diabetes mellitus, hypothyroidism, chronic kidney disease, and major depressive disorder who presented with nausea, vomiting, diarrhea and abdominal pain, and was found to have severe hyperglycemia at home despite reported insulin  compliance. On admission, laboratory evaluation revealed elevated beta-hydroxybutyrate and urine ketones with a mild anion gap metabolic acidosis (pH 7.314), but he did not meet formal criteria for diabetic ketoacidosis. Initial management included an insulin  infusion, with rapid improvement in blood glucose and transition to subcutaneous insulin . CT abdomen/pelvis demonstrated possible enteritis, esophagitis.  On hospital day 5, he developed acute worsening of abdominal pain, fever, chills, hypotension, tachycardia, and non-bloody diarrhea. Repeat CT imaging revealed long segment colitis from the transverse colon to the rectum, collapsed small bowel loops, and a small fluid collection in the posterior lower abdominal wall. He was  treated for several days with Zosyn  without improvement; discontinued for lack of clear indication. GI was consulted, and pt underwent flex sigmoidoscopy which revealed normal bowel up to 35mm, however procedure had to be aborted early due to apnea, thus no biopsy or further examination completed. After procedure, pt with recurrent diarrhea, however stool became progressively more formed. Nonetheless severe abd pain persisted, thus GI reconsulted. They suggested CT AP which revealed resolution of colitis with significant colonic stool burden. Bowel reg reinitiated.   During admit, labs showed a marked rise in transaminases (ALT 346, AST 737), alkaline phosphatase 135, and lactic acid 2.6. LFTs downtrended spontaneously, returning to near normal by time of discharge. Viral hepatitis panel was negative, AIH labs negative. Gastroenterology felt potentially likely attributable to ischemic hepatopathy from dehydration  During admit, pt reporting severe low back pain at a specific site superolateral to R of gluteal cleft where he described fluctuance and tenderness. He reported several years prior he had self-I/D'd a similar collection more to midline superior to gluteal cleft, with drainage of malodorous purulent material; this collection self-resolved and had not recurred. EGS consulted, found most c/w phlegmon without abscess, no procedural intervention. US  was used to trend several days later showing stability of phlegmonoous/cellulitic change without abscess. Given severe pain, and recent poorly controlled diabetes w/ hyperglycemia, opted to treat empirically with course of bactrim. Pt reports pain improving well by time of discharge. No drainage or surfaced collection appreciated on exam.   Other issues addressed during admission included chronic kidney disease with baseline creatinine 1.2-1.4, which peaked at 1.80 during the acute illness and subsequently improved. Pt with episode of severe headache during  admit, migranous in character but described as worst headache of life with associated  neurologic sx, CT head wnl, and headache self resolved c/w migraine. Pt also reporting longstanding incomplete evacuation of urine with urinary hesitance developing during admit, started on flomax with plan for output uro f/u.  Pt advised on f/u appts, med changes, return ppx/alarm sx; pt expresses understanding and is agreement with plan for discharge.  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  #Colitis: Presumed infectious given self-resolved with negative colo. #Abnormal liver enzymes: etiology unclear, low concern for ischemic hepatopathy, but do not have more compelling etiology to account for acute rise and fall. AIH workup negative > CT on admission showed enteritis, esophagitis, colitis. Workup unrevealing, greatest suspicion for self-resolving infectious gastroenteritis. Flexible sigmoidoscopy limited but without visible inflammation.  > Sharp uptick in transaminases during admit, possible shock liver, now improved and nearly normalized. AIH workup neg - Trend LFTs; on outpt f/u - f/u Fecal Calprotectin, though low suspicion for IBD at this juncture - Bowel reg w/ Miralax  + senna, pt instructed to titrate to BM - PCP f/u - Morphine  15-30mg  PRN for mod-severe pain; given short course, and instructed to self taper with improving pain. Instructed on risk of constipation and importance of bowel reg as relief of intestinal stool burden expected to yield improved pain control. To seek care if pain not improving.  T1DM (type 1 diabetes mellitus) Presented with hyperglycemia, elevated beta-hydroxybutyrate, and mild anion gap metabolic acidosis (pH 7.31), consistent with mild ketosis but not DKA. Managed initially with insulin  infusion, then transitioned to subcutaneous regimen. HBA1C noted to be 11.5.  - PCP/virt diabetes mgmt f/u -  Insulin  regimen per GMT  Low back phlegmon > Inc risk of infection w/ poorly controlled diabetes. Ongoing pain. Cellulitic change on US  of site. Pain improving with abx - Bactrim DS BID x7d  Urinary retention > Reports chronic intermittent voiding difficulty, worsening while admitted, without dysuria or hematuria; most consistent with obstructive versus irritative pattern. Potentially contribution from constipation. Initial PVR with some retention, resolved on repeat. - Trial Tamsulosin 0.4mg  PO daily - Bowel reg as above - Outpt uro f/u  The patient's chronic medical conditions were treated accordingly per the patient's home medication regimen except as noted in the plan above and in the medication list below.    Discharge Condition:   Disposition: Patient discharged to Home or Self Care in stable condition.  Diet at discharge: Ensure High Protein; Daily with Lunch Ensure High Protein; Daily with Lunch Adult Diet- Consistent Carbohydrate; Med (45-60 gm/meal)  Physical Exam at Discharge   BP 145/85 (BP Location: Right arm, Patient Position: Sitting)   Pulse 99   Temp 98.3 F (36.8 C) (Oral)   Resp 18   Ht 1.676 m (5' 6)   Wt 88.1 kg (194 lb 3.2 oz)   SpO2 96%   BMI 31.34 kg/m   Gen: Alert, conversant in full sentences without need for pause, in NAD CV: RRR, s1/s2, no m/r/g Pulm: No increased WoB on RA; CTAB Abd: tender Extrem: WWP, trace LE edema b/l. Low back w/o tenderness, fluctuance, erythema.   Discharge Medications:      Medication List     START taking these medications    morphine  30 mg tablet Commonly known as: MSIR Take ONE-HALF TABLET to 1 tablet (15-30 mg total) by mouth every 4 hours as needed for moderate pain or severe pain. (Take 0.5-1 tablets (15-30 mg total) by mouth every 4 (four) hours as needed for moderate pain (4-6) or  severe pain (7-10).)   pen needle, diabetic 31 gauge x 5/16 Ndle Use to injected insulin  as directed by your  provider. (Use as directed to inject insulin  4 times a day) Replaces: pen needle, diabetic 30 gauge x 5/16 Ndle   polyethylene glycol 17 gram packet Commonly known as: GLYCOLAX  Take 17 g by mouth daily as needed for constipation.   senna 8.6 mg tablet Commonly known as: SENOKOT Take 2 tablets (17.2 mg total) by mouth daily as needed for constipation.   sulfamethoxazole-trimethoprim 800-160 mg per tablet Commonly known as: BACTRIM DS Take 1 tablet by mouth every 12 (twelve) hours for 4 days.   tamsulosin 0.4 mg Cap Commonly known as: FLOMAX Take 1 capsule (0.4 mg total) by mouth daily after dinner.       CHANGE how you take these medications    Dexcom G7 Sensor Generic drug: blood-glucose sensor Use as directed to monitor blood sugar levels. Follow package directions for replacement. What changed: See the new instructions.   * insulin  glargine 100 unit/mL (3 mL) pen Commonly known as: LANTUS  SoloStar Inject 18 Units under the skin every morning AND 15 Units at bedtime. Discard each used pen 28 days after first use. (Inject 18 Units under the skin every morning AND 15 Units at bedtime.) What changed: See the new instructions.   * Lantus  Solostar U-100 Insulin  100 unit/mL (3 mL) pen Generic drug: insulin  glargine Inject 18 units under the skin daily every morning AND 15 units daily at bedtime What changed: You were already taking a medication with the same name, and this prescription was added. Make sure you understand how and when to take each.   * insulin  lispro 100 unit/mL KwikPen Commonly known as: Admelog  SoloStar U-100 Insulin  Inject 10 units into the skin 3 times daily with meals in addition to 0 to 6 units for blood glucose target > 180 mg/dl 3 times daily with meals using sliding scale listed below:  BG= 180-200: 1 units  BG= 201-250: 3 units  BG= 251-300: 4 units  BG= 301-350: 5 units  BG= 351-400: 6 units  BG >400: Call Provider  Discard each used pen 28  days after first use. (Inject 10 units into the skin 3 times daily with meals in addition to 0-6 units for bg target > 180 mg/dl 3 times daily with meals using sliding scale listed below: BG= 180-200 1 units BG= 201-250 3 units BG= 251-300 4 units BG= 301-350 5 units BG= 351-400 6 units BG >400 Call Provider) What changed:  how much to take how to take this when to take this additional instructions   * insulin  lispro 100 unit/mL KwikPen Commonly known as: HumaLOG  KwikPen Inject 10 units into the skin 3 times daily with meals in addition to 0-6 units for bg target > 180 mg/dl 3 times daily with meals using sliding scale listed below: BG= 180-200 1 units BG= 201-250 3 units BG= 251-300 4 units BG= 301-350 5 units BG= 351-400 6 units BG >400 Call Provider What changed: You were already taking a medication with the same name, and this prescription was added. Make sure you understand how and when to take each.      * * This list has 4 medication(s) that are the same as other medications prescribed for you. Read the directions carefully, and ask your doctor or other care provider to review them with you.          CONTINUE taking these medications  acetaminophen  500 mg tablet Commonly known as: TYLENOL  Take 1,000 mg by mouth as needed for headaches (pain).   albuterol  HFA 90 mcg/actuation inhaler Commonly known as: PROVENTIL  HFA;VENTOLIN  HFA;PROAIR  HFA Inhale 2 puffs every 6 (six) hours as needed for wheezing or shortness of breath.   gabapentin  300 mg capsule Commonly known as: NEURONTIN  TAKE 1 CAPSULE BY MOUTH THREE TIMES DAILY   levothyroxine  175 mcg tablet Commonly known as: SYNTHROID  Take 175 mcg by mouth every morning.   ondansetron  4 mg disintegrating tablet Commonly known as: ZOFRAN -ODT Dissolve 4 mg on tongue every 8 (eight) hours as needed for nausea or vomiting.       STOP taking these medications    chlorhexidine  4 % external liquid Commonly known as:  HIBICLENS    Dexcom G6 Receiver Generic drug: blood-glucose meter,receiver,continuous   Dexcom G6 Transmitter Generic drug: blood-glucose transmitter device   dicyclomine 10 mg capsule Commonly known as: BENTYL   FLUoxetine  20 mg capsule Commonly known as: PROzac    multivitamin with folic acid 400 mcg Tab   mupirocin  2 % cream Commonly known as: BACTROBAN    pen needle, diabetic 30 gauge x 5/16 Ndle Replaced by: pen needle, diabetic 31 gauge x 5/16 Ndle   pen needle, diabetic 31 gauge x 3/16 Ndle   ProSource Plus 15-100 gram-kcal/30 mL Liqd liquid Generic drug: protein (amino acids)         Where to Get Your Medications     These medications were sent to [facility] [address] [place] [address]    Hours: Open Monday 12am to Friday 11:59pm; Sat-Sun: Closed; Holidays: Closed Thanksgiving Phone: [phone]  insulin  glargine 100 unit/mL (3 mL) pen insulin  lispro 100 unit/mL KwikPen morphine  30 mg tablet pen needle, diabetic 31 gauge x 5/16 Ndle senna 8.6 mg tablet sulfamethoxazole-trimethoprim 800-160 mg per tablet tamsulosin 0.4 mg Cap    These medications were sent to [facility] - [place], [place] - [address] - PHONE: [phone] - FAX: [phone]  [address] [place], [place] [address]    Phone: [phone]  Dexcom G7 Sensor insulin  lispro 100 unit/mL KwikPen Lantus  Solostar U-100 Insulin  100 unit/mL (3 mL) pen    Information about where to get these medications is not yet available   Ask your nurse or doctor about these medications polyethylene glycol 17 gram packet     Significant Diagnostic Tests:   LABS:  Lab Results  Component Value Date   WBC 6.60 07/02/2024   HGB 11.5 (L) 07/02/2024   HCT 33.3 (L) 07/02/2024   PLT 424 07/02/2024   ALT 79 (H) 07/02/2024   AST 44 (H) 07/02/2024   NA 134 (L) 07/02/2024   K 4.3 07/02/2024   CL 99 07/02/2024   CREATININE 1.43 (H) 07/02/2024    BUN 15 07/02/2024   CO2 25 07/02/2024   PTT 26.8 06/19/2024   INR 0.9 06/27/2024   HGBA1C 11.5 (H) 06/19/2024   IMAGING:  CT Abdomen Pelvis W Contrast  Final Result by Bonna Stallion, MD [phone])  CT ABDOMEN PELVIS W CONTRAST, 06/19/2024 8:55 PM    INDICATION: Abdominal pain, acute, nonlocalized   ADDITIONAL HISTORY: None.  COMPARISON: 06/16/2023 CT abdomen pelvis.    TECHNIQUE: CT images of the abdomen and pelvis were obtained after   intravenous administration of iodinated contrast. Conventional axial   reconstructions and multiplanar reformatted images were submitted for   review.     FINDINGS:    . Lower Chest:  Intraluminal debris and circumferential thickening of the   distal thoracic esophagus. Mild bibasilar atelectasis. No focal   consolidation or pleural effusion.    . Liver: No suspicious focal findings. Focal fat deposition along   falciform ligament gallbladder fossa.  . Gallbladder/Biliary: Unremarkable.  SABRA Spleen: Unremarkable. Small splenule.  . Pancreas: Similar calcifications of the pancreatic head, likely sequela   of chronic pancreatitis. No main pancreatic ductal dilatation or adjacent   pancreatic inflammatory fat stranding.  . Adrenals: Unremarkable.  . Kidneys: Similar right renal scarring. No hydronephrosis.    . Peritoneum/Mesenteries/Extraperitoneum: No free air. No free fluid or   loculated drainable collection. No pathologically enlarged lymph nodes.  . Gastrointestinal tract: No evidence of obstruction. Normal caliber,   superiorly directed appendix which terminates just caudal to the inferior   margin of the right hepatic lobe. Small volume of free fluid adjacent to a   fluid-filled loop of small bowel in the right lower quadrant (series 3,   image 122).    SABRA Ureters: Unremarkable.  . Bladder: Mild bladder distention.  . Reproductive System: Unremarkable.    . Vascular: Unremarkable.  . Musculoskeletal: No acute displaced  fractures. No aggressive focal bony   lesions. Abdominal wall soft tissues unremarkable.      IMPRESSION:  1.  Small focal area of free fluid adjacent to a fluid-filled loop of   small bowel in the right lower quadrant, which can be seen with a   nonspecific enteritis.  2.  Thickening of the distal thoracic esophagus can be seen with   esophagitis.    XR Chest 2 Views  Final Result by Eduard Vikki Guppy, MD (10/13 2146)  XR CHEST 2 VIEWS, 06/19/2024 5:59 PM    INDICATION: Chest Pain   COMPARISON: Chest x-ray 06/25/2020    FINDINGS:     Cardiovascular: Cardiac silhouette and pulmonary vasculature are within   normal limits.  Mediastinum: Within normal limits.  Lungs/pleura: Elevation of the right hemidiaphragm. Lungs are clear,   without focal consolidation, pulmonary edema, pleural effusion, or   pneumothorax.  Upper abdomen: Moderate to large colonic stool burden.  Chest wall/osseous structures: Unremarkable.      IMPRESSION:  There is no evidence of acute cardiac or pulmonary abnormality.       CULTURES:  Results for orders placed or performed during the hospital encounter of 06/19/24 (from the past week)  Gastrointestinal Pathogen Profile, Stool, NAAT   Specimen: Stool, Per Rectum  Result Value Ref Range   Campylobacter PCR Not Detected Not Detected   Plesiomonas shigelloides PCR Not Detected Not Detected   Salmonella PCR Not Detected Not Detected   Vibrio PCR Not Detected Not Detected   Yersinia enterocolitica PCR Not Detected Not Detected   Enteroaggregative E coli PCR Not Detected Not Detected   Enteropathogenic E coli PCR Not Detected Not Detected   Enterotoxigenic E coli PCR Not Detected Not Detected   Shiga-Toxin-Producing E coli PCR Not Detected Not Detected   Shigella/Enteroinvasive E coli PCR Not Detected Not Detected   Cryptosporidium PCR Not Detected Not Detected   Cyclospora cayetanensis PCR Not Detected Not Detected   Entamoeba histolytica PCR Not  Detected Not Detected   Giardia lamblia PCR Not Detected Not Detected   Adenovirus F 40/41 PCR Not Detected Not Detected   Astrovirus PCR Not Detected Not Detected   Norovirus GI/GII PCR Not Detected Not Detected   Rotavirus A PCR Not Detected Not Detected   Sapovirus PCR Not Detected Not Detected  Consults:   IP CONSULT TO ENDOCRINOLOGY IP CONSULT TO GASTROENTEROLOGY IP CONSULT TO GENERAL SURGERY   Follow-up Appointments:     Future Appointments  Date Time Provider Department Center  10/04/2024  2:00 PM [facility] [place], MD [facility] RES GAS [facility] [place]      Electronically signed by: Jordan Matthew Poles, MD 07/03/2024 2:26 PM Time spent on discharge: 50 minutes.  *Some images could not be shown.

## 2024-07-04 ENCOUNTER — Telehealth: Payer: Self-pay

## 2024-07-04 NOTE — Transitions of Care (Post Inpatient/ED Visit) (Signed)
 07/04/2024  Name: David Ramsey MRN: [id number] DOB: [birth year]  Today's TOC FU Call Status: Today's TOC FU Call Status:: Successful TOC FU Call Completed TOC FU Call Complete Date: 07/04/24 Patient's Name and Date of Birth confirmed.  Transition Care Management Follow-up Telephone Call Date of Discharge: 07/03/24 Discharge Facility: Other [facility]) Name of Other (Non[facility]) Discharge Facility: [facility] Type of Discharge: Inpatient Admission Primary Inpatient Discharge Diagnosis:: Hyperglycemia How have you been since you were released from the hospital?: Better Any questions or concerns?: No  Items Reviewed: Did you receive and understand the discharge instructions provided?: Yes Medications obtained,verified, and reconciled?: Yes (Medications Reviewed) Any new allergies since your discharge?: No Dietary orders reviewed?: Yes Type of Diet Ordered:: Diabetic Do you have support at home?: Yes People in Home [RPT]: spouse Name of Support/Comfort Primary Source: [name], spouse  Medications Reviewed Today: Medications Reviewed Today     Reviewed by Moises Reusing, RN (Case Manager) on 07/04/24 at 1500  Med List Status: <None>   Medication Order Taking? Sig Documenting Provider Last Dose Status Informant  albuterol  (VENTOLIN  HFA) 108 (90 Base) MCG/ACT inhaler [id number] Yes Inhale 2 puffs into the lungs every 6 (six) hours as needed for wheezing or shortness of breath. Almarie Waddell NOVAK, NP  Active   blood glucose meter kit and supplies KIT [id number]  Dispense based on patient and insurance preference. Use up to four times daily as directed. Sheikh, Omair Latif, DO  Active Self  CLEAR EYES PURE RELIEF MS PF 0.3-0.25-0.1 % SOLN [id number]  Place 1 drop into both eyes 3 (three) times daily as needed (for irritation). [provider]  Active Self  Continuous Glucose Sensor (DEXCOM G7 SENSOR) MISC [id number]  Apply one sensor every 10 days. Almarie Waddell NOVAK, NP   Active   gabapentin  (NEURONTIN ) 300 MG capsule [id number] Yes TAKE 1 CAPSULE BY MOUTH 3 TIMES DAILY Almarie Waddell NOVAK, NP  Active   insulin  aspart (NOVOLOG  RELION) 100 UNIT/ML injection [id number] Yes Inject 8-10 Units into the skin 3 (three) times daily before meals.  Patient taking differently: Inject 10 Units into the skin 3 (three) times daily before meals. BG= 180-200: 1 units  BG= 201-250: 3 units  BG= 251-300: 4 units  BG= 301-350: 5 units  BG= 351-400: 6 units  BG >400: Call Provider   Almarie Waddell NOVAK, NP  Active   insulin  glargine-yfgn (SEMGLEE ) 100 UNIT/ML Pen [id number] Yes Inject 26 Units into the skin at bedtime.  Patient taking differently: Inject 26 Units into the skin at bedtime. Inject 18 units every morning and 15 units daily at bedtime   Amin, Sumayya, MD  Active   Insulin  Pen Needle 32G X 6 MM MISC [id number]  10 Units by Does not apply route 3 (three) times daily before meals. Caleen Qualia, MD  Active   levothyroxine  (SYNTHROID ) 175 MCG tablet [id number] Yes Take 1 tablet (175 mcg total) by mouth daily before breakfast. Almarie Waddell NOVAK, NP  Active   morphine  (MSIR) 30 MG tablet [id number] Yes Take 30 mg by mouth every 4 (four) hours as needed for severe pain (pain score 7-10). [provider]  Active   ondansetron  (ZOFRAN -ODT) 4 MG disintegrating tablet [id number] Yes Take 1 tablet (4 mg total) by mouth every 8 (eight) hours as needed for nausea or vomiting. Almarie Waddell NOVAK, NP  Active   polyethylene glycol (MIRALAX  / GLYCOLAX ) 17 g packet [id number] Yes Take 17 g by mouth daily as needed for moderate constipation. [provider]  Active   senna (SENOKOT) 8.6 MG TABS tablet [id number] Yes Take 2 tablets by mouth daily as needed for moderate constipation. [provider]  Active   sulfamethoxazole-trimethoprim (BACTRIM DS) 800-160 MG tablet [id number] Yes Take 1 tablet by mouth 2 (two) times daily. For four days [provider]  Active   tamsulosin  (FLOMAX) 0.4 MG CAPS capsule [id number] Yes Take 0.4 mg by mouth daily after supper. [provider]  Active   venlafaxine  XR (EFFEXOR  XR) 37.5 MG 24 hr capsule [id number] Yes Take 1 capsule (37.5 mg total) by mouth daily with breakfast. Almarie Waddell NOVAK, NP  Active             Home Care and Equipment/Supplies: Were Home Health Services Ordered?: NA Any new equipment or medical supplies ordered?: NA  Functional Questionnaire: Do you need assistance with bathing/showering or dressing?: No Do you need assistance with meal preparation?: No Do you need assistance with eating?: No Do you have difficulty maintaining continence: No Do you need assistance with getting out of bed/getting out of a chair/moving?: No Do you have difficulty managing or taking your medications?: No  Follow up appointments reviewed: PCP Follow-up appointment confirmed?: No (The patient does not feel like he needs to come in) MD Provider Line Number:[phone] Given: No Specialist Hospital Follow-up appointment confirmed?: No Reason Specialist Follow-Up Not Confirmed: Patient has Specialist Provider Number and will Call for Appointment (The patient states he has an appointment in January with a GI specialist) Do you need transportation to your follow-up appointment?: No Do you understand care options if your condition(s) worsen?: Yes-patient verbalized understanding  SDOH Interventions Today    Flowsheet Row Most Recent Value  SDOH Interventions   Food Insecurity Interventions Intervention Not Indicated  Housing Interventions Intervention Not Indicated  Transportation Interventions Intervention Not Indicated  Utilities Interventions Intervention Not Indicated    Medford Balboa, BSN, RN [place]  VBCI - [facility] RN Care Manager [phone]

## 2024-07-27 ENCOUNTER — Other Ambulatory Visit: Payer: Self-pay | Admitting: Family Medicine

## 2024-07-27 DIAGNOSIS — R112 Nausea with vomiting, unspecified: Secondary | ICD-10-CM

## 2024-07-27 DIAGNOSIS — E063 Autoimmune thyroiditis: Secondary | ICD-10-CM

## 2024-07-27 DIAGNOSIS — E1040 Type 1 diabetes mellitus with diabetic neuropathy, unspecified: Secondary | ICD-10-CM

## 2024-07-27 DIAGNOSIS — E1065 Type 1 diabetes mellitus with hyperglycemia: Secondary | ICD-10-CM

## 2024-07-27 NOTE — Telephone Encounter (Signed)
 Copied from CRM [id number]. Topic: Clinical - Medication Question >> Jul 27, 2024  1:08 PM Ahlexyia S wrote: Reason for CRM: Pt called in requesting medication refills. Medication refill request has been put in but pt is stating there are certain brand names that need to be filled for his insulin . Pt mentioned lantis insulin  glargine injection 100units/mL and admelog  solostar 100units/mL. Due to the list that he has and the way everything is listed on his chart, I'm not really sure exactly which one is which so its best to contact the pt to get a better explanation.

## 2024-07-27 NOTE — Telephone Encounter (Signed)
 Copied from CRM [id number]. Topic: Clinical - Medication Refill >> Jul 27, 2024 12:58 PM Ahlexyia S wrote: Medication:  morphine  (MSIR) 30 MG tablet insulin  glargine-yfgn (SEMGLEE ) 100 UNIT/ML Pen insulin  aspart (NOVOLOG  RELION) 100 UNIT/ML injection senna (SENOKOT) 8.6 MG TABS tablet levothyroxine  (SYNTHROID ) 175 MCG tablet ondansetron  (ZOFRAN -ODT) 4 MG disintegrating tablet gabapentin  (NEURONTIN ) 300 MG capsule  Has the patient contacted their pharmacy? No, pt was advised to contact providers office. (Agent: If no, request that the patient contact the pharmacy for the refill. If patient does not wish to contact the pharmacy document the reason why and proceed with request.) (Agent: If yes, when and what did the pharmacy advise?)  This is the patient's preferred pharmacy:  [facility] [address], [place] - [address] [place] [address] Phone: [phone] Fax: [phone]  Is this the correct pharmacy for this prescription? Yes If no, delete pharmacy and type the correct one.   Has the prescription been filled recently? No  Is the patient out of the medication? No, limited supply of a few.  Has the patient been seen for an appointment in the last year OR does the patient have an upcoming appointment? Yes  Can we respond through MyChart? Yes  Agent: Please be advised that Rx refills may take up to 3 business days. We ask that you follow-up with your pharmacy.

## 2024-08-01 MED ORDER — INSULIN GLARGINE-YFGN 100 UNIT/ML ~~LOC~~ SOPN: 26.0000 [IU] | PEN_INJECTOR | Freq: Every day | SUBCUTANEOUS | 2 refills | Status: AC

## 2024-08-01 MED ORDER — GABAPENTIN 300 MG PO CAPS: 300.0000 mg | ORAL_CAPSULE | Freq: Three times a day (TID) | ORAL | 3 refills | Status: AC

## 2024-08-01 MED ORDER — SENNA 8.6 MG PO TABS: 2.0000 | ORAL_TABLET | Freq: Every day | ORAL | 1 refills | Status: AC | PRN

## 2024-08-01 MED ORDER — ONDANSETRON 4 MG PO TBDP: 4.0000 mg | ORAL_TABLET | Freq: Three times a day (TID) | ORAL | 0 refills | Status: AC | PRN
